# Patient Record
Sex: Male | Born: 1965 | Race: White | Hispanic: No | Marital: Single | Smoking: Former smoker
Health system: Southern US, Community
[De-identification: ages and names within clinical notes are randomized; demographics above are authoritative.]

## PROBLEM LIST (undated history)

## (undated) DIAGNOSIS — I2510 Atherosclerotic heart disease of native coronary artery without angina pectoris: Secondary | ICD-10-CM

## (undated) DIAGNOSIS — J449 Chronic obstructive pulmonary disease, unspecified: Secondary | ICD-10-CM

## (undated) DIAGNOSIS — J45909 Unspecified asthma, uncomplicated: Secondary | ICD-10-CM

## (undated) DIAGNOSIS — F419 Anxiety disorder, unspecified: Secondary | ICD-10-CM

## (undated) DIAGNOSIS — E119 Type 2 diabetes mellitus without complications: Secondary | ICD-10-CM

## (undated) DIAGNOSIS — I509 Heart failure, unspecified: Secondary | ICD-10-CM

## (undated) DIAGNOSIS — I10 Essential (primary) hypertension: Secondary | ICD-10-CM

## (undated) DIAGNOSIS — I4891 Unspecified atrial fibrillation: Secondary | ICD-10-CM

## (undated) HISTORY — PX: ORTHOPEDIC SURGERY: SHX850

## (undated) HISTORY — PX: SKIN GRAFT: SHX250

## (undated) HISTORY — PX: TOTAL HIP ARTHROPLASTY: SHX124

## (undated) HISTORY — PX: KNEE SURGERY: SHX244

---

## 2001-12-24 DIAGNOSIS — W3400XA Accidental discharge from unspecified firearms or gun, initial encounter: Secondary | ICD-10-CM | POA: Insufficient documentation

## 2001-12-24 DIAGNOSIS — Y249XXA Unspecified firearm discharge, undetermined intent, initial encounter: Secondary | ICD-10-CM | POA: Insufficient documentation

## 2005-09-19 ENCOUNTER — Other Ambulatory Visit: Payer: Self-pay

## 2005-09-19 ENCOUNTER — Emergency Department: Payer: Self-pay | Admitting: Emergency Medicine

## 2006-02-15 ENCOUNTER — Emergency Department: Payer: Self-pay | Admitting: Emergency Medicine

## 2007-05-01 ENCOUNTER — Other Ambulatory Visit: Payer: Self-pay

## 2007-05-01 ENCOUNTER — Inpatient Hospital Stay: Payer: Self-pay | Admitting: Internal Medicine

## 2007-05-03 ENCOUNTER — Inpatient Hospital Stay: Payer: Self-pay | Admitting: Unknown Physician Specialty

## 2007-07-16 ENCOUNTER — Inpatient Hospital Stay: Payer: Self-pay | Admitting: Unknown Physician Specialty

## 2007-08-12 ENCOUNTER — Encounter
Admission: RE | Admit: 2007-08-12 | Discharge: 2007-08-16 | Payer: Self-pay | Admitting: Physical Medicine & Rehabilitation

## 2007-08-16 ENCOUNTER — Ambulatory Visit: Payer: Self-pay | Admitting: Physical Medicine & Rehabilitation

## 2009-01-29 ENCOUNTER — Encounter: Payer: Self-pay | Admitting: Family Medicine

## 2009-02-23 ENCOUNTER — Encounter: Payer: Self-pay | Admitting: Family Medicine

## 2009-11-06 ENCOUNTER — Emergency Department: Payer: Self-pay | Admitting: Emergency Medicine
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
## 2011-04-27 DIAGNOSIS — M1610 Unilateral primary osteoarthritis, unspecified hip: Secondary | ICD-10-CM | POA: Insufficient documentation

## 2011-05-18 DIAGNOSIS — Z96649 Presence of unspecified artificial hip joint: Secondary | ICD-10-CM | POA: Insufficient documentation

## 2011-05-18 DIAGNOSIS — M21379 Foot drop, unspecified foot: Secondary | ICD-10-CM | POA: Insufficient documentation

## 2011-05-19 DIAGNOSIS — F329 Major depressive disorder, single episode, unspecified: Secondary | ICD-10-CM | POA: Insufficient documentation

## 2011-05-19 DIAGNOSIS — F1910 Other psychoactive substance abuse, uncomplicated: Secondary | ICD-10-CM | POA: Insufficient documentation

## 2011-10-22 DIAGNOSIS — M549 Dorsalgia, unspecified: Secondary | ICD-10-CM | POA: Insufficient documentation

## 2012-01-06 ENCOUNTER — Inpatient Hospital Stay: Payer: Self-pay | Admitting: Psychiatry

## 2012-01-06 LAB — URINALYSIS, COMPLETE
Bacteria: NONE SEEN
Bilirubin,UR: NEGATIVE
Blood: NEGATIVE
Glucose,UR: 50 mg/dL (ref 0–75)
Protein: NEGATIVE
RBC,UR: 2 /HPF (ref 0–5)
Squamous Epithelial: 2
WBC UR: 1 /HPF (ref 0–5)

## 2012-01-06 LAB — DRUG SCREEN, URINE
Amphetamines, Ur Screen: NEGATIVE (ref ?–1000)
Barbiturates, Ur Screen: NEGATIVE (ref ?–200)
Benzodiazepine, Ur Scrn: NEGATIVE (ref ?–200)
Cannabinoid 50 Ng, Ur ~~LOC~~: NEGATIVE (ref ?–50)
Cocaine Metabolite,Ur ~~LOC~~: NEGATIVE (ref ?–300)
MDMA (Ecstasy)Ur Screen: NEGATIVE (ref ?–500)
Phencyclidine (PCP) Ur S: NEGATIVE (ref ?–25)

## 2012-01-06 LAB — CBC
HCT: 44.9 % (ref 40.0–52.0)
HGB: 15.4 g/dL (ref 13.0–18.0)
MCV: 91 fL (ref 80–100)
RDW: 14.4 % (ref 11.5–14.5)
WBC: 5.8 10*3/uL (ref 3.8–10.6)

## 2012-01-06 LAB — COMPREHENSIVE METABOLIC PANEL
Albumin: 4.1 g/dL (ref 3.4–5.0)
Alkaline Phosphatase: 69 U/L (ref 50–136)
Bilirubin,Total: 0.4 mg/dL (ref 0.2–1.0)
Creatinine: 0.74 mg/dL (ref 0.60–1.30)
Glucose: 98 mg/dL (ref 65–99)
Osmolality: 267 (ref 275–301)
Potassium: 3.9 mmol/L (ref 3.5–5.1)
SGOT(AST): 18 U/L (ref 15–37)
Sodium: 135 mmol/L — ABNORMAL LOW (ref 136–145)
Total Protein: 8.8 g/dL — ABNORMAL HIGH (ref 6.4–8.2)

## 2012-01-06 LAB — ETHANOL
Ethanol %: <0.003 % (ref 0.000–0.080)
Ethanol: <3 mg/dL

## 2012-01-06 LAB — ACETAMINOPHEN LEVEL: Acetaminophen: 5 ug/mL — ABNORMAL LOW

## 2012-01-06 LAB — SALICYLATE LEVEL: Salicylates, Serum: 3.7 mg/dL — ABNORMAL HIGH

## 2012-01-07 LAB — BASIC METABOLIC PANEL
Anion Gap: 3 — ABNORMAL LOW (ref 7–16)
BUN: 7 mg/dL (ref 7–18)
Chloride: 104 mmol/L (ref 98–107)
Co2: 33 mmol/L — ABNORMAL HIGH (ref 21–32)
Creatinine: 0.67 mg/dL (ref 0.60–1.30)
EGFR (African American): >60
EGFR (Non-African Amer.): >60
Glucose: 131 mg/dL — ABNORMAL HIGH (ref 65–99)
Osmolality: 279 (ref 275–301)
Potassium: 4.3 mmol/L (ref 3.5–5.1)
Sodium: 140 mmol/L (ref 136–145)

## 2012-01-12 ENCOUNTER — Ambulatory Visit: Payer: Self-pay | Admitting: Pain Medicine

## 2012-01-28 ENCOUNTER — Ambulatory Visit: Payer: Self-pay | Admitting: Pain Medicine

## 2012-02-23 ENCOUNTER — Inpatient Hospital Stay: Payer: Self-pay | Admitting: Internal Medicine

## 2012-02-23 LAB — PRO B NATRIURETIC PEPTIDE: B-Type Natriuretic Peptide: 442 pg/mL — ABNORMAL HIGH (ref 0–125)

## 2012-02-23 LAB — COMPREHENSIVE METABOLIC PANEL
Alkaline Phosphatase: 110 U/L (ref 50–136)
BUN: 5 mg/dL — ABNORMAL LOW (ref 7–18)
Bilirubin,Total: 0.5 mg/dL (ref 0.2–1.0)
Calcium, Total: 9.3 mg/dL (ref 8.5–10.1)
Chloride: 97 mmol/L — ABNORMAL LOW (ref 98–107)
Co2: 36 mmol/L — ABNORMAL HIGH (ref 21–32)
Creatinine: 0.55 mg/dL — ABNORMAL LOW (ref 0.60–1.30)
EGFR (African American): >60
EGFR (Non-African Amer.): >60
Osmolality: 276 (ref 275–301)
Potassium: 4.3 mmol/L (ref 3.5–5.1)
SGPT (ALT): 13 U/L (ref 12–78)
Sodium: 137 mmol/L (ref 136–145)
Total Protein: 8.7 g/dL — ABNORMAL HIGH (ref 6.4–8.2)

## 2012-02-23 LAB — CK TOTAL AND CKMB (NOT AT ARMC): CK-MB: <0.5 ng/mL — ABNORMAL LOW (ref 0.5–3.6)

## 2012-02-23 LAB — CBC
HGB: 12.5 g/dL — ABNORMAL LOW (ref 13.0–18.0)
MCH: 28.8 pg (ref 26.0–34.0)
Platelet: 329 10*3/uL (ref 150–440)
RBC: 4.34 10*6/uL — ABNORMAL LOW (ref 4.40–5.90)
RDW: 14.9 % — ABNORMAL HIGH (ref 11.5–14.5)
WBC: 10.3 10*3/uL (ref 3.8–10.6)

## 2012-02-23 LAB — TROPONIN I: Troponin-I: <0.02 ng/mL

## 2012-02-24 LAB — CBC WITH DIFFERENTIAL/PLATELET
Basophil %: 0.2 %
Eosinophil #: 0 10*3/uL (ref 0.0–0.7)
HCT: 38.1 % — ABNORMAL LOW (ref 40.0–52.0)
HGB: 12.4 g/dL — ABNORMAL LOW (ref 13.0–18.0)
Lymphocyte %: 7.4 %
Monocyte #: 0.3 x10 3/mm (ref 0.2–1.0)
Neutrophil #: 5.7 10*3/uL (ref 1.4–6.5)
Platelet: 276 10*3/uL (ref 150–440)
RBC: 4.1 10*6/uL — ABNORMAL LOW (ref 4.40–5.90)

## 2012-02-24 LAB — LIPID PANEL
HDL Cholesterol: 47 mg/dL (ref 40–60)
Triglycerides: 68 mg/dL (ref 0–200)
VLDL Cholesterol, Calc: 14 mg/dL (ref 5–40)

## 2012-02-24 LAB — BASIC METABOLIC PANEL
Anion Gap: 3 — ABNORMAL LOW (ref 7–16)
BUN: 13 mg/dL (ref 7–18)
Calcium, Total: 9.1 mg/dL (ref 8.5–10.1)
Chloride: 96 mmol/L — ABNORMAL LOW (ref 98–107)
Creatinine: 0.69 mg/dL (ref 0.60–1.30)
Glucose: 182 mg/dL — ABNORMAL HIGH (ref 65–99)
Osmolality: 280 (ref 275–301)

## 2012-02-29 LAB — CULTURE, BLOOD (SINGLE)

## 2012-03-02 ENCOUNTER — Emergency Department: Payer: Self-pay | Admitting: Emergency Medicine

## 2012-03-02 LAB — COMPREHENSIVE METABOLIC PANEL
Anion Gap: 2 — ABNORMAL LOW (ref 7–16)
Bilirubin,Total: 0.3 mg/dL (ref 0.2–1.0)
EGFR (African American): >60
EGFR (Non-African Amer.): >60
Glucose: 127 mg/dL — ABNORMAL HIGH (ref 65–99)
SGPT (ALT): 34 U/L (ref 12–78)
Sodium: 135 mmol/L — ABNORMAL LOW (ref 136–145)

## 2012-03-02 LAB — CBC
HCT: 42.5 % (ref 40.0–52.0)
MCH: 30 pg (ref 26.0–34.0)
MCV: 92 fL (ref 80–100)
Platelet: 218 10*3/uL (ref 150–440)
RBC: 4.59 10*6/uL (ref 4.40–5.90)
RDW: 15.3 % — ABNORMAL HIGH (ref 11.5–14.5)
WBC: 6.9 10*3/uL (ref 3.8–10.6)

## 2012-04-07 ENCOUNTER — Inpatient Hospital Stay: Payer: Self-pay | Admitting: Internal Medicine

## 2012-04-07 LAB — COMPREHENSIVE METABOLIC PANEL
BUN: 5 mg/dL — ABNORMAL LOW (ref 7–18)
Bilirubin,Total: 0.3 mg/dL (ref 0.2–1.0)
Calcium, Total: 8.6 mg/dL (ref 8.5–10.1)
Co2: 36 mmol/L — ABNORMAL HIGH (ref 21–32)
EGFR (African American): >60
SGPT (ALT): 14 U/L (ref 12–78)
Sodium: 138 mmol/L (ref 136–145)

## 2012-04-07 LAB — CBC WITH DIFFERENTIAL/PLATELET
Basophil #: 0 10*3/uL (ref 0.0–0.1)
Eosinophil #: 0.1 10*3/uL (ref 0.0–0.7)
HCT: 41.8 % (ref 40.0–52.0)
HGB: 13.5 g/dL (ref 13.0–18.0)
Lymphocyte #: 1.4 10*3/uL (ref 1.0–3.6)
MCH: 29.9 pg (ref 26.0–34.0)
MCV: 93 fL (ref 80–100)
RBC: 4.5 10*6/uL (ref 4.40–5.90)

## 2012-04-07 LAB — PRO B NATRIURETIC PEPTIDE: B-Type Natriuretic Peptide: 926 pg/mL — ABNORMAL HIGH (ref 0–125)

## 2012-04-07 LAB — TROPONIN I: Troponin-I: <0.02 ng/mL

## 2012-04-07 LAB — RAPID INFLUENZA A&B ANTIGENS

## 2012-04-08 LAB — BASIC METABOLIC PANEL
Anion Gap: 3 — ABNORMAL LOW (ref 7–16)
BUN: 7 mg/dL (ref 7–18)
Co2: 35 mmol/L — ABNORMAL HIGH (ref 21–32)
EGFR (African American): >60
Glucose: 117 mg/dL — ABNORMAL HIGH (ref 65–99)
Osmolality: 278 (ref 275–301)
Potassium: 4.3 mmol/L (ref 3.5–5.1)
Sodium: 140 mmol/L (ref 136–145)

## 2012-04-08 LAB — CBC WITH DIFFERENTIAL/PLATELET
Basophil #: 0 10*3/uL (ref 0.0–0.1)
Basophil %: 0.1 %
Eosinophil #: 0 10*3/uL (ref 0.0–0.7)
HCT: 41.5 % (ref 40.0–52.0)
HGB: 13.2 g/dL (ref 13.0–18.0)
MCHC: 31.8 g/dL — ABNORMAL LOW (ref 32.0–36.0)
Monocyte #: 0.3 x10 3/mm (ref 0.2–1.0)
Neutrophil %: 76 %
RBC: 4.49 10*6/uL (ref 4.40–5.90)
RDW: 14.7 % — ABNORMAL HIGH (ref 11.5–14.5)

## 2012-05-26 ENCOUNTER — Inpatient Hospital Stay: Payer: Self-pay | Admitting: Internal Medicine

## 2012-05-26 LAB — TROPONIN I: Troponin-I: <0.02 ng/mL

## 2012-05-26 LAB — COMPREHENSIVE METABOLIC PANEL
Albumin: 3.7 g/dL (ref 3.4–5.0)
Alkaline Phosphatase: 55 U/L (ref 50–136)
Anion Gap: 2 — ABNORMAL LOW (ref 7–16)
BUN: 6 mg/dL — ABNORMAL LOW (ref 7–18)
Bilirubin,Total: 0.4 mg/dL (ref 0.2–1.0)
Calcium, Total: 8.5 mg/dL (ref 8.5–10.1)
Co2: 36 mmol/L — ABNORMAL HIGH (ref 21–32)
Creatinine: 0.6 mg/dL (ref 0.60–1.30)
EGFR (African American): >60
EGFR (Non-African Amer.): >60
Glucose: 156 mg/dL — ABNORMAL HIGH (ref 65–99)
Osmolality: 275 (ref 275–301)
Sodium: 137 mmol/L (ref 136–145)

## 2012-05-26 LAB — PRO B NATRIURETIC PEPTIDE: B-Type Natriuretic Peptide: 146 pg/mL — ABNORMAL HIGH (ref 0–125)

## 2012-05-26 LAB — CBC
HCT: 41 % (ref 40.0–52.0)
MCH: 29.8 pg (ref 26.0–34.0)
MCHC: 33.2 g/dL (ref 32.0–36.0)
RBC: 4.57 10*6/uL (ref 4.40–5.90)
RDW: 15.3 % — ABNORMAL HIGH (ref 11.5–14.5)
WBC: 3.9 10*3/uL (ref 3.8–10.6)

## 2012-05-26 LAB — CK TOTAL AND CKMB (NOT AT ARMC): CK, Total: 52 U/L (ref 35–232)

## 2012-05-27 LAB — CBC WITH DIFFERENTIAL/PLATELET
Basophil #: 0 10*3/uL (ref 0.0–0.1)
Eosinophil #: 0 10*3/uL (ref 0.0–0.7)
HCT: 43.5 % (ref 40.0–52.0)
HGB: 14.2 g/dL (ref 13.0–18.0)
Lymphocyte #: 0.4 10*3/uL — ABNORMAL LOW (ref 1.0–3.6)
Lymphocyte %: 5.9 %
MCH: 29.5 pg (ref 26.0–34.0)
Monocyte #: 0.2 x10 3/mm (ref 0.2–1.0)
Monocyte %: 2.4 %
Neutrophil %: 91.6 %
Platelet: 169 10*3/uL (ref 150–440)
RBC: 4.82 10*6/uL (ref 4.40–5.90)
RDW: 15.4 % — ABNORMAL HIGH (ref 11.5–14.5)
WBC: 7.1 10*3/uL (ref 3.8–10.6)

## 2012-05-27 LAB — BASIC METABOLIC PANEL
BUN: 12 mg/dL (ref 7–18)
Calcium, Total: 8.8 mg/dL (ref 8.5–10.1)
Chloride: 101 mmol/L (ref 98–107)
Co2: 30 mmol/L (ref 21–32)
EGFR (Non-African Amer.): >60
Osmolality: 275 (ref 275–301)

## 2012-06-17 ENCOUNTER — Inpatient Hospital Stay: Payer: Self-pay | Admitting: Family Medicine

## 2012-06-17 LAB — BASIC METABOLIC PANEL
BUN: 6 mg/dL — ABNORMAL LOW (ref 7–18)
Calcium, Total: 8.8 mg/dL (ref 8.5–10.1)
Co2: 36 mmol/L — ABNORMAL HIGH (ref 21–32)
Creatinine: 0.58 mg/dL — ABNORMAL LOW (ref 0.60–1.30)
EGFR (African American): >60
EGFR (Non-African Amer.): >60
Potassium: 3.9 mmol/L (ref 3.5–5.1)

## 2012-06-17 LAB — CBC
HCT: 40.6 % (ref 40.0–52.0)
HGB: 13.5 g/dL (ref 13.0–18.0)
MCHC: 33.2 g/dL (ref 32.0–36.0)
Platelet: 159 10*3/uL (ref 150–440)
RDW: 16.1 % — ABNORMAL HIGH (ref 11.5–14.5)

## 2012-06-17 LAB — CK TOTAL AND CKMB (NOT AT ARMC): CK, Total: 47 U/L (ref 35–232)

## 2012-06-17 LAB — HEMOGLOBIN A1C: Hemoglobin A1C: 6.9 % — ABNORMAL HIGH (ref 4.2–6.3)

## 2012-06-17 LAB — TROPONIN I: Troponin-I: <0.02 ng/mL

## 2012-06-18 LAB — CBC WITH DIFFERENTIAL/PLATELET
Basophil #: 0 10*3/uL (ref 0.0–0.1)
Eosinophil %: 0.1 %
HGB: 13.5 g/dL (ref 13.0–18.0)
Lymphocyte #: 0.2 10*3/uL — ABNORMAL LOW (ref 1.0–3.6)
Lymphocyte %: 6.9 %
MCHC: 32.8 g/dL (ref 32.0–36.0)
Monocyte #: 0 x10 3/mm — ABNORMAL LOW (ref 0.2–1.0)
Monocyte %: 1.2 %
Neutrophil #: 3.3 10*3/uL (ref 1.4–6.5)
Platelet: 151 10*3/uL (ref 150–440)
RDW: 16.3 % — ABNORMAL HIGH (ref 11.5–14.5)

## 2012-06-18 LAB — BASIC METABOLIC PANEL
Chloride: 98 mmol/L (ref 98–107)
Creatinine: 0.61 mg/dL (ref 0.60–1.30)
EGFR (African American): >60
Glucose: 176 mg/dL — ABNORMAL HIGH (ref 65–99)
Potassium: 4.5 mmol/L (ref 3.5–5.1)
Sodium: 136 mmol/L (ref 136–145)

## 2012-06-19 LAB — CBC WITH DIFFERENTIAL/PLATELET
Basophil #: 0 10*3/uL (ref 0.0–0.1)
Basophil %: 0.1 %
Eosinophil #: 0 10*3/uL (ref 0.0–0.7)
Eosinophil %: 0.1 %
HCT: 43.7 % (ref 40.0–52.0)
HGB: 14.5 g/dL (ref 13.0–18.0)
Lymphocyte %: 5.5 %
Monocyte #: 0.2 x10 3/mm (ref 0.2–1.0)
Neutrophil #: 5.3 10*3/uL (ref 1.4–6.5)
Neutrophil %: 90.6 %
Platelet: 158 10*3/uL (ref 150–440)
WBC: 5.9 10*3/uL (ref 3.8–10.6)

## 2012-06-19 LAB — BASIC METABOLIC PANEL
Anion Gap: 3 — ABNORMAL LOW (ref 7–16)
BUN: 11 mg/dL (ref 7–18)
Co2: 34 mmol/L — ABNORMAL HIGH (ref 21–32)
Creatinine: 0.43 mg/dL — ABNORMAL LOW (ref 0.60–1.30)
EGFR (Non-African Amer.): >60
Glucose: 153 mg/dL — ABNORMAL HIGH (ref 65–99)
Osmolality: 282 (ref 275–301)
Sodium: 140 mmol/L (ref 136–145)

## 2012-06-19 LAB — PRO B NATRIURETIC PEPTIDE: B-Type Natriuretic Peptide: 543 pg/mL — ABNORMAL HIGH (ref 0–125)

## 2012-06-19 LAB — CK TOTAL AND CKMB (NOT AT ARMC): CK-MB: <0.5 ng/mL — ABNORMAL LOW (ref 0.5–3.6)

## 2012-06-20 DIAGNOSIS — I4891 Unspecified atrial fibrillation: Secondary | ICD-10-CM

## 2012-06-20 LAB — BASIC METABOLIC PANEL
BUN: 16 mg/dL (ref 7–18)
Calcium, Total: 8.5 mg/dL (ref 8.5–10.1)
Creatinine: 0.56 mg/dL — ABNORMAL LOW (ref 0.60–1.30)
EGFR (African American): >60
Glucose: 197 mg/dL — ABNORMAL HIGH (ref 65–99)
Osmolality: 288 (ref 275–301)
Potassium: 4.3 mmol/L (ref 3.5–5.1)

## 2012-06-21 LAB — BASIC METABOLIC PANEL
Anion Gap: 6 — ABNORMAL LOW (ref 7–16)
BUN: 16 mg/dL (ref 7–18)
Chloride: 102 mmol/L (ref 98–107)
EGFR (African American): >60
Glucose: 173 mg/dL — ABNORMAL HIGH (ref 65–99)
Osmolality: 283 (ref 275–301)

## 2012-07-17 ENCOUNTER — Inpatient Hospital Stay: Payer: Self-pay | Admitting: Student

## 2012-07-17 LAB — BASIC METABOLIC PANEL
Anion Gap: 5 — ABNORMAL LOW (ref 7–16)
BUN: 9 mg/dL (ref 7–18)
Calcium, Total: 8.7 mg/dL (ref 8.5–10.1)
Chloride: 98 mmol/L (ref 98–107)
Creatinine: 0.77 mg/dL (ref 0.60–1.30)
EGFR (African American): >60
EGFR (Non-African Amer.): >60
Glucose: 175 mg/dL — ABNORMAL HIGH (ref 65–99)
Osmolality: 273 (ref 275–301)
Potassium: 3.2 mmol/L — ABNORMAL LOW (ref 3.5–5.1)

## 2012-07-17 LAB — HEMOGLOBIN A1C: Hemoglobin A1C: 6.7 % — ABNORMAL HIGH (ref 4.2–6.3)

## 2012-07-17 LAB — CBC
MCHC: 34.3 g/dL (ref 32.0–36.0)
MCV: 90 fL (ref 80–100)
Platelet: 232 10*3/uL (ref 150–440)
RDW: 15.4 % — ABNORMAL HIGH (ref 11.5–14.5)

## 2012-07-18 LAB — CBC WITH DIFFERENTIAL/PLATELET
Basophil %: 0.1 %
Eosinophil #: 0 10*3/uL (ref 0.0–0.7)
Eosinophil %: 0.1 %
HCT: 42.4 % (ref 40.0–52.0)
HGB: 14.6 g/dL (ref 13.0–18.0)
MCHC: 34.4 g/dL (ref 32.0–36.0)
MCV: 89 fL (ref 80–100)
Neutrophil #: 5.6 10*3/uL (ref 1.4–6.5)
Neutrophil %: 91.3 %
Platelet: 238 10*3/uL (ref 150–440)
RBC: 4.75 10*6/uL (ref 4.40–5.90)
RDW: 15.3 % — ABNORMAL HIGH (ref 11.5–14.5)
WBC: 6.1 10*3/uL (ref 3.8–10.6)

## 2012-07-18 LAB — BASIC METABOLIC PANEL
Anion Gap: 7 (ref 7–16)
BUN: 8 mg/dL (ref 7–18)
Chloride: 101 mmol/L (ref 98–107)
EGFR (Non-African Amer.): >60
Glucose: 181 mg/dL — ABNORMAL HIGH (ref 65–99)
Potassium: 4.2 mmol/L (ref 3.5–5.1)
Sodium: 138 mmol/L (ref 136–145)

## 2012-07-29 ENCOUNTER — Inpatient Hospital Stay: Payer: Self-pay | Admitting: Internal Medicine

## 2012-07-29 LAB — BASIC METABOLIC PANEL
BUN: 5 mg/dL — ABNORMAL LOW (ref 7–18)
Creatinine: 0.77 mg/dL (ref 0.60–1.30)
EGFR (African American): >60
EGFR (Non-African Amer.): >60
Osmolality: 280 (ref 275–301)
Potassium: 3.9 mmol/L (ref 3.5–5.1)
Sodium: 139 mmol/L (ref 136–145)

## 2012-07-29 LAB — HEMOGLOBIN A1C: Hemoglobin A1C: 6.8 % — ABNORMAL HIGH (ref 4.2–6.3)

## 2012-07-29 LAB — TROPONIN I: Troponin-I: <0.02 ng/mL

## 2012-07-29 LAB — CBC
HGB: 14 g/dL (ref 13.0–18.0)
MCH: 31.4 pg (ref 26.0–34.0)
MCV: 92 fL (ref 80–100)
RDW: 15.4 % — ABNORMAL HIGH (ref 11.5–14.5)

## 2012-07-30 LAB — COMPREHENSIVE METABOLIC PANEL
Albumin: 2.9 g/dL — ABNORMAL LOW (ref 3.4–5.0)
Alkaline Phosphatase: 42 U/L — ABNORMAL LOW (ref 50–136)
Anion Gap: 2 — ABNORMAL LOW (ref 7–16)
BUN: 7 mg/dL (ref 7–18)
Calcium, Total: 8.7 mg/dL (ref 8.5–10.1)
Chloride: 101 mmol/L (ref 98–107)
Co2: 35 mmol/L — ABNORMAL HIGH (ref 21–32)
Creatinine: 0.56 mg/dL — ABNORMAL LOW (ref 0.60–1.30)
EGFR (African American): >60
EGFR (Non-African Amer.): >60
Osmolality: 279 (ref 275–301)
Potassium: 4.1 mmol/L (ref 3.5–5.1)
SGPT (ALT): 23 U/L (ref 12–78)
Total Protein: 6.1 g/dL — ABNORMAL LOW (ref 6.4–8.2)

## 2012-07-30 LAB — CBC WITH DIFFERENTIAL/PLATELET
HCT: 38.3 % — ABNORMAL LOW (ref 40.0–52.0)
HGB: 13.1 g/dL (ref 13.0–18.0)
MCH: 31.5 pg (ref 26.0–34.0)
MCV: 92 fL (ref 80–100)
Monocyte #: 0.1 x10 3/mm — ABNORMAL LOW (ref 0.2–1.0)
Neutrophil %: 93.4 %
Platelet: 161 10*3/uL (ref 150–440)
RBC: 4.16 10*6/uL — ABNORMAL LOW (ref 4.40–5.90)
RDW: 14.8 % — ABNORMAL HIGH (ref 11.5–14.5)
WBC: 7.4 10*3/uL (ref 3.8–10.6)

## 2012-07-30 LAB — TROPONIN I: Troponin-I: <0.02 ng/mL

## 2012-12-26 ENCOUNTER — Emergency Department: Payer: Self-pay | Admitting: Emergency Medicine

## 2012-12-26 LAB — CBC
HGB: 15.8 g/dL (ref 13.0–18.0)
MCV: 93 fL (ref 80–100)
Platelet: 205 10*3/uL (ref 150–440)
RBC: 5.02 10*6/uL (ref 4.40–5.90)
RDW: 13.4 % (ref 11.5–14.5)

## 2012-12-26 LAB — COMPREHENSIVE METABOLIC PANEL
Alkaline Phosphatase: 69 U/L (ref 50–136)
BUN: 7 mg/dL (ref 7–18)
Bilirubin,Total: 0.4 mg/dL (ref 0.2–1.0)
Calcium, Total: 9.2 mg/dL (ref 8.5–10.1)
Co2: 35 mmol/L — ABNORMAL HIGH (ref 21–32)
Creatinine: 0.74 mg/dL (ref 0.60–1.30)
EGFR (African American): >60
EGFR (Non-African Amer.): >60
Osmolality: 263 (ref 275–301)
Potassium: 4.4 mmol/L (ref 3.5–5.1)
SGOT(AST): 31 U/L (ref 15–37)
SGPT (ALT): 38 U/L (ref 12–78)
Total Protein: 7.9 g/dL (ref 6.4–8.2)

## 2012-12-26 LAB — URINALYSIS, COMPLETE
Glucose,UR: NEGATIVE mg/dL (ref 0–75)
Ketone: NEGATIVE
Nitrite: NEGATIVE
Protein: NEGATIVE
RBC,UR: 2 /HPF (ref 0–5)
Specific Gravity: 1.011 (ref 1.003–1.030)
Squamous Epithelial: 2
WBC UR: <1 /HPF (ref 0–5)

## 2012-12-26 LAB — ETHANOL
Ethanol %: <0.003 % (ref 0.000–0.080)
Ethanol: <3 mg/dL

## 2012-12-26 LAB — DRUG SCREEN, URINE
Amphetamines, Ur Screen: POSITIVE (ref ?–1000)
Barbiturates, Ur Screen: NEGATIVE (ref ?–200)
Benzodiazepine, Ur Scrn: POSITIVE (ref ?–200)
Cocaine Metabolite,Ur ~~LOC~~: NEGATIVE (ref ?–300)
Methadone, Ur Screen: NEGATIVE (ref ?–300)
Tricyclic, Ur Screen: NEGATIVE (ref ?–1000)

## 2013-01-05 DIAGNOSIS — J189 Pneumonia, unspecified organism: Secondary | ICD-10-CM | POA: Insufficient documentation

## 2013-05-31 DIAGNOSIS — G8929 Other chronic pain: Secondary | ICD-10-CM | POA: Insufficient documentation

## 2013-06-01 DIAGNOSIS — Z87828 Personal history of other (healed) physical injury and trauma: Secondary | ICD-10-CM | POA: Insufficient documentation

## 2013-06-01 DIAGNOSIS — F419 Anxiety disorder, unspecified: Secondary | ICD-10-CM | POA: Insufficient documentation

## 2013-08-24 DIAGNOSIS — Z720 Tobacco use: Secondary | ICD-10-CM | POA: Insufficient documentation

## 2013-08-24 DIAGNOSIS — Z716 Tobacco abuse counseling: Secondary | ICD-10-CM | POA: Insufficient documentation

## 2014-02-20 DIAGNOSIS — F323 Major depressive disorder, single episode, severe with psychotic features: Secondary | ICD-10-CM | POA: Insufficient documentation

## 2014-03-04 DIAGNOSIS — J9621 Acute and chronic respiratory failure with hypoxia: Secondary | ICD-10-CM | POA: Insufficient documentation

## 2014-03-04 DIAGNOSIS — J9622 Acute and chronic respiratory failure with hypercapnia: Secondary | ICD-10-CM

## 2014-05-04 DIAGNOSIS — G4733 Obstructive sleep apnea (adult) (pediatric): Secondary | ICD-10-CM | POA: Insufficient documentation

## 2014-06-11 DIAGNOSIS — R45851 Suicidal ideations: Secondary | ICD-10-CM | POA: Insufficient documentation

## 2014-06-12 NOTE — Discharge Summary (Signed)
PATIENT NAME:  Craig Wright, Craig Wright MR#:  [id number] DATE OF BIRTH:  1965/04/08  DATE OF ADMISSION:  01/06/2012 DATE OF DISCHARGE:  01/11/2012  HOSPITAL COURSE: See dictated history and physical for details of admission. This 49 year old man came into the hospital requesting detox from opiates because of his overuse of his prescription pain medicine. The patient does have a severe chronic pain problem. Dr. Nicolasa Ducking worked with him for the first half of his hospital stay and they agreed that he would not be taken completely off of opiates but would be taken down to a lower stable dose of opiates. The patient has also been treated for his depression and anxiety with Cymbalta and Neurontin. Low dose of Risperdal was added because of perceived paranoia. The patient has not displayed any dangerous or suicidal behavior in the hospital. He has been cooperative with treatment. He is looking forward to going to the Pain Clinic to have an injection in his spine tomorrow which he is optimistic about. The patient at this point is not showing any signs of withdrawal. Medically stable. Mentally much improved without crying spells, agitation, anxiety, or panic attacks. Denies suicidality. The patient has a safe place to live and has follow-up treatment in place. He will be discharged to follow-up in the community.   DISCHARGE MEDICATIONS:  1. Trazodone 25 mg at night.  2. Risperdal 1 mg at night.  3. Gabapentin 300 mg 3 times a day.  4. Spiriva HandiHaler 1 capsule daily.  5. Remeron 45 mg at night.  6. Cymbalta 30 mg at bedtime and 60 mg in the morning.  7. Norvasc 5 mg per day.  8. Alprazolam twice a day. 9. Combivent inhaler 1 puff 4 times a day. 10. Percocet 1 tablet q.6 hours.   LABORATORY RESULTS: On admission acetaminophen low below toxic at 5, BUN 4, slightly low. Sodium slightly low at 135, total protein elevated at 8.8. Alcohol undetectable. CBC normal. Salicylates slightly elevated but not toxic. Urinalysis  unremarkable. Drug screen positive for opiates.   EKG showed normal sinus rhythm, possible left atrial enlargement.   Vitamin B12 level was normal. Follow-up chemistry showed a glucose slightly elevated at 131, sodium normal at 140, CO2 elevated at 33.  X-ray done in his eyes showed no signs of metal.   MRI done on the spine showed large left paracentral L4-5 disk protrusion with severe spinal stenosis.  A KUB showed no contraindication to MRI.   DISCHARGE MENTAL STATUS EXAMINATION: Casually dressed, adequately groomed man, looks his stated age. Clearly he has a limp and has malformations from his previous trauma and appears to be in chronic pain. Eye contact good. Psychomotor activity slow. Speech normal in rate, tone, and volume. Affect euthymic, reactive and appropriate. Mood stated as okay. Thoughts are lucid without any loosening of associations or delusions. Denies auditory or visual hallucinations or any sort of paranoia. Denies suicidal or homicidal ideation. Good insight and judgment improved, general outlook improved, optimism improved, insight and judgment normal memory.   DIAGNOSIS PRINCIPLE AND PRIMARY:  AXIS I: Depression, not otherwise specified.   SECONDARY DIAGNOSES:  AXIS I: History of opiate abuse.   AXIS II: Deferred.   AXIS III:  1. Chronic pain.  2. Chronic obstructive pulmonary disease.   AXIS IV: Severe from his chronic pain and disability.        AXIS V: Functioning at time of discharge 88.  ____________________________ Gonzella Lex, MD jtc:drc D: 01/11/2012 10:59:09 ET T: 01/11/2012 12:18:32 ET JOB#:  [id number]  cc: Gonzella Lex, MD, <Dictator> Gonzella Lex MD ELECTRONICALLY SIGNED 01/11/2012 13:00
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
## 2014-06-15 NOTE — Discharge Summary (Signed)
PATIENT NAME:  Craig Wright, Craig Wright MR#:  [id number] DATE OF BIRTH:  02/20/1966  DATE OF ADMISSION:  05/26/2012 DATE OF DISCHARGE:  05/28/2012  PRIMARY CARE PHYSICIAN:  Dr. Brunetta Genera.   DISCHARGE DIAGNOSES: 1.  Acute respiratory failure over chronic respiratory failure.  2.  Acute chronic obstructive pulmonary disease exacerbation.  3.  Acute bronchitis.  4.  Tobacco abuse.   IMAGING STUDIES DONE:  Include a chest x-ray showed no acute abnormalities.   ADMITTING HISTORY, PHYSICAL AND HOSPITAL COURSE:  Please see detailed H and P dictated on 05/26/2012.  In brief, a 48 year old patient was admitted for worsening shortness of breath, wheezing, without any pneumonia on chest x-ray for acute chronic obstructive pulmonary disease exacerbation.  The patient was started on IV steroids, nebulizers, along with oxygen support.  He did need increased oxygen initially, but on the day of discharge is back to baseline without any wheezing on exam.  He is being transitioned from IV steroids to oral steroids, the antibiotics will be continued for 4 more days and steroids tapered over 12 days.  The patient feels comfortable, has walked around the hallway without any shortness of breath.   No wheezing on exam.  Blood pressure is 127/72, saturating 94% on 3 liters of oxygen.   HOME MEDICATIONS: 1.  Cymbalta 60 mg oral 2 times a day.  2.  Metformin 500 mg oral twice a day. 3.  Spiriva inhaled once a day.  4.  Remeron 45 mg oral once a day.  5.  Percocet 10/325 1 tablet oral every 6 hours as needed for pain.  6.  Norvasc 5 mg oral once a day.  7.  Risperdal 1 mg oral once a day.  8.  Trazodone 50 mg 1/2 tablet oral once a day at bedtime.  9.  DuoNeb 3 ml nebulized 4 times a day.  10.  Advair Diskus 250/50 inhaled 2 times a day.  11.  Xanax 1 mg oral 3 times a day as needed.  12.  Aspirin 81 mg oral once a day.  13.  Prednisone 60 mg oral on day 1 and tapered over 12 days.  14.  Levaquin 750 mg oral once a day  for 4 days.   DISCHARGE INSTRUCTIONS:  The patient will be on a low-sodium diet with activity as tolerated.  Continue oxygen at 3 liters at home.  Follow up with primary care physician in one week.  I discussed this plan with the patient and his mother at bedside.   The patient was also counseled to quit smoking during the hospital stay.   Time spent on day of discharge was 35 minutes.    ____________________________ Leia Alf Deaisa Merida, MD srs:ea D: 05/28/2012 15:08:23 ET T: 05/29/2012 06:28:42 ET JOB#: [id number]  cc: Alveta Heimlich R. Annagrace Carr, MD, <Dictator> Dr. Dayton Martes MD ELECTRONICALLY SIGNED 05/30/2012 14:58
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
## 2014-06-15 NOTE — Discharge Summary (Signed)
PATIENT NAME:  Craig Wright, Craig Wright MR#:  [id number] DATE OF BIRTH:  August 27, 1965  DATE OF ADMISSION:  02/23/2012 DATE OF DISCHARGE:  02/26/2012  PRESENTING COMPLAINT:  Shortness of breath and cough.   DISCHARGE DIAGNOSES:   1.  Acute on chronic hypoxic respiratory failure secondary to chronic obstructive pulmonary disease failure.  2.  Pneumonia, right lower lobe.  3.  Tobacco abuse.  4.  New onset diabetes.   CODE STATUS:  Full code.   Saturations _____ than 100% on 2 liters nasal cannula oxygen. The patient uses chronically home oxygen.   MEDICATIONS:   1.  Norvasc 5 mg daily.  2.  Remeron 45 mg at bedtime.  3.  Percocet 10/325 mg 1 tablet q. 6 hours p.r.n.  4.  Combivent 1 puff 4 times a day as needed.  5.  Neurontin 300 mg 1 capsule 3 times a day.  6.  Trazodone 25 mg at bedtime.  7.  Risperdal 1 mg at bedtime.  8.  Spiriva 1 capsule inhalation daily.  9.  Cymbalta 60 mg b.i.d.  10.  Xanax 1 mg 3 times a day.  11.  Metformin 500 mg twice a day.  12.  Levofloxacin 750 mg orally daily.  13.  Prednisone taper as instructed.   DIET:  ADA 1800 calorie diet, regular consistency.   FOLLOWUP:   1.  With Dr. Brunetta Genera in 1 to 2 weeks.  2.  The patient prescribed glucometer. Recommended to keep log of sugars to review with primary care physician.    DIAGNOSTIC DATA:  White count 6.4, H and H 12.4 and 38.1, platelet count 276. Glucose 182, BUN is 13, creatinine 0.69, sodium 138, potassium 4.7, chloride 96, bicarbonate 39, calcium 9.1. Lipid profile is within normal limits. Cardiac enzymes x3 are negative. Hemoglobin A1c is 6.7. Echo Doppler showed normal EF of more than 55%, no wall motion abnormality. Left atrial and right atrial size is normal. Blood cultures negative in 48 hours. BNP is 42. LFTs are within normal limits. Chest x-ray on admission right lower lobe pneumonia.   BRIEF SUMMARY OF HOSPITAL COURSE:  The patient is a 49 year old Caucasian gentleman with history of COPD on home  oxygen and hypertension who comes in with:  1.  Acute on chronic respiratory failure secondary to COPD flare and right lower lobe pneumonia. The patient was started on IV Solu-Medrol and was changed to p.o. taper once symptoms improved. He was continued on IV antibiotics and changed to p.o. to complete a 7-day course. His oxygen was weaned down to 2 liters nasal cannula oxygen.  2.  SIRS secondary to right lower lobe pneumonia. The patient will complete a course of Levaquin.  3.  COPD exacerbation. Steroids, nebulizers and inhalers were given. The patient will continue his home oxygen as before.  4.  Tobacco abuse. The patient was advised smoking cessation.  Anxiety or depression on Remeron and p.r.n. pain medications.  5.  Anxiety and depression on Remeron and p.r.n. pain meds.  6.  Hospital stay otherwise remained stable. The patient will follow up with Dr. Brunetta Genera as outpatient.   TIME SPENT:  40 minutes.   ____________________________ Hart Rochester Posey Pronto, MD sap:si D: 02/28/2012 07:08:14 ET T: 02/28/2012 15:39:31 ET JOB#: [id number]  cc: Ivoree Felmlee A. Posey Pronto, MD, <Dictator> Meindert A. Brunetta Genera, MD Ilda Basset MD ELECTRONICALLY SIGNED 03/04/2012 12:34

## 2014-06-15 NOTE — Consult Note (Signed)
Brief Consult Note: Diagnosis: Atrial fibrillation: first documented episode in setting of COPD.   Patient was seen by consultant.   Consult note dictated.   Discussed with Attending MD.   Comments: I think we should try Diltiazem or Metoprolol before considering an antiarrhythmic medication.  I stopped Amidoarone and started Diltiazem 60 mg po q 6 hours which can be changed to long acting upon discharge. I also stopped Amlodipine.  Electronic Signatures: Kathlyn Sacramento (MD)  (Signed 28-Apr-14 18:11)  Authored: Brief Consult Note   Last Updated: 28-Apr-14 18:11 by Kathlyn Sacramento (MD)
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
## 2014-11-08 DIAGNOSIS — E1165 Type 2 diabetes mellitus with hyperglycemia: Secondary | ICD-10-CM | POA: Insufficient documentation

## 2014-11-15 DIAGNOSIS — I5032 Chronic diastolic (congestive) heart failure: Secondary | ICD-10-CM | POA: Insufficient documentation

## 2015-01-02 ENCOUNTER — Ambulatory Visit: Payer: Self-pay | Admitting: Family Medicine

## 2015-06-07 ENCOUNTER — Encounter: Payer: Self-pay | Admitting: Emergency Medicine

## 2015-06-07 ENCOUNTER — Inpatient Hospital Stay
Admission: EM | Admit: 2015-06-07 | Discharge: 2015-06-10 | DRG: 190 | Disposition: A | Discharge status: Home / Self Care | Payer: Medicaid Other | Attending: Internal Medicine | Admitting: Internal Medicine

## 2015-06-07 ENCOUNTER — Emergency Department: Payer: Medicaid Other

## 2015-06-07 DIAGNOSIS — E119 Type 2 diabetes mellitus without complications: Secondary | ICD-10-CM | POA: Diagnosis present

## 2015-06-07 DIAGNOSIS — J441 Chronic obstructive pulmonary disease with (acute) exacerbation: Secondary | ICD-10-CM | POA: Diagnosis not present

## 2015-06-07 DIAGNOSIS — J9622 Acute and chronic respiratory failure with hypercapnia: Secondary | ICD-10-CM | POA: Diagnosis present

## 2015-06-07 DIAGNOSIS — J9602 Acute respiratory failure with hypercapnia: Secondary | ICD-10-CM

## 2015-06-07 DIAGNOSIS — I10 Essential (primary) hypertension: Secondary | ICD-10-CM | POA: Diagnosis present

## 2015-06-07 DIAGNOSIS — Z7984 Long term (current) use of oral hypoglycemic drugs: Secondary | ICD-10-CM

## 2015-06-07 DIAGNOSIS — J9621 Acute and chronic respiratory failure with hypoxia: Secondary | ICD-10-CM | POA: Diagnosis present

## 2015-06-07 DIAGNOSIS — F17210 Nicotine dependence, cigarettes, uncomplicated: Secondary | ICD-10-CM | POA: Diagnosis present

## 2015-06-07 DIAGNOSIS — Z79899 Other long term (current) drug therapy: Secondary | ICD-10-CM | POA: Diagnosis not present

## 2015-06-07 DIAGNOSIS — Z8049 Family history of malignant neoplasm of other genital organs: Secondary | ICD-10-CM

## 2015-06-07 DIAGNOSIS — Z9981 Dependence on supplemental oxygen: Secondary | ICD-10-CM | POA: Diagnosis not present

## 2015-06-07 DIAGNOSIS — Z79891 Long term (current) use of opiate analgesic: Secondary | ICD-10-CM

## 2015-06-07 DIAGNOSIS — Z800 Family history of malignant neoplasm of digestive organs: Secondary | ICD-10-CM

## 2015-06-07 DIAGNOSIS — J189 Pneumonia, unspecified organism: Secondary | ICD-10-CM | POA: Diagnosis present

## 2015-06-07 DIAGNOSIS — E871 Hypo-osmolality and hyponatremia: Secondary | ICD-10-CM | POA: Diagnosis present

## 2015-06-07 DIAGNOSIS — F419 Anxiety disorder, unspecified: Secondary | ICD-10-CM | POA: Diagnosis present

## 2015-06-07 DIAGNOSIS — J9601 Acute respiratory failure with hypoxia: Secondary | ICD-10-CM

## 2015-06-07 DIAGNOSIS — R0600 Dyspnea, unspecified: Secondary | ICD-10-CM | POA: Diagnosis present

## 2015-06-07 HISTORY — DX: Chronic obstructive pulmonary disease, unspecified: J44.9

## 2015-06-07 HISTORY — DX: Anxiety disorder, unspecified: F41.9

## 2015-06-07 HISTORY — DX: Essential (primary) hypertension: I10

## 2015-06-07 HISTORY — DX: Type 2 diabetes mellitus without complications: E11.9

## 2015-06-07 HISTORY — DX: Unspecified asthma, uncomplicated: J45.909

## 2015-06-07 LAB — CBC
HEMATOCRIT: 43.6 % (ref 40.0–52.0)
HEMOGLOBIN: 14.3 g/dL (ref 13.0–18.0)
MCH: 29.3 pg (ref 26.0–34.0)
MCHC: 32.9 g/dL (ref 32.0–36.0)
MCV: 89.2 fL (ref 80.0–100.0)
Platelets: 202 10*3/uL (ref 150–440)
RBC: 4.89 MIL/uL (ref 4.40–5.90)
RDW: 13.6 % (ref 11.5–14.5)
WBC: 8 10*3/uL (ref 3.8–10.6)

## 2015-06-07 LAB — BASIC METABOLIC PANEL
Anion gap: 8 (ref 5–15)
BUN: 9 mg/dL (ref 6–20)
CALCIUM: 9.2 mg/dL (ref 8.9–10.3)
CO2: 41 mmol/L — ABNORMAL HIGH (ref 22–32)
CREATININE: 0.71 mg/dL (ref 0.61–1.24)
Chloride: 90 mmol/L — ABNORMAL LOW (ref 101–111)
GFR calc non Af Amer: >60 mL/min (ref >60)
Glucose, Bld: 149 mg/dL — ABNORMAL HIGH (ref 65–99)
Potassium: 4.1 mmol/L (ref 3.5–5.1)
SODIUM: 139 mmol/L (ref 135–145)

## 2015-06-07 LAB — URINALYSIS COMPLETE WITH MICROSCOPIC (ARMC ONLY)
BACTERIA UA: NONE SEEN
Bilirubin Urine: NEGATIVE
Glucose, UA: 50 mg/dL — AB
HGB URINE DIPSTICK: NEGATIVE
Ketones, ur: NEGATIVE mg/dL
LEUKOCYTES UA: NEGATIVE
NITRITE: NEGATIVE
PROTEIN: NEGATIVE mg/dL
SPECIFIC GRAVITY, URINE: 1.009 (ref 1.005–1.030)
pH: 8 (ref 5.0–8.0)

## 2015-06-07 LAB — BLOOD GAS, VENOUS
Acid-Base Excess: 15.6 mmol/L — ABNORMAL HIGH (ref 0.0–3.0)
Bicarbonate: 44.6 mEq/L — ABNORMAL HIGH (ref 21.0–28.0)
O2 Saturation: 80.7 %
PCO2 VEN: 72 mmHg — AB (ref 44.0–60.0)
PH VEN: 7.4 (ref 7.320–7.430)
Patient temperature: 37
pO2, Ven: 45 mmHg (ref 31.0–45.0)

## 2015-06-07 LAB — GLUCOSE, CAPILLARY: GLUCOSE-CAPILLARY: 209 mg/dL — AB (ref 65–99)

## 2015-06-07 MED ORDER — OXYCODONE HCL 5 MG PO TABS
ORAL_TABLET | ORAL | Status: AC
  Filled 2015-06-07: qty 1

## 2015-06-07 MED ORDER — OXYCODONE HCL 5 MG PO TABS
ORAL_TABLET | ORAL | Status: AC
Start: 2015-06-07 — End: 2015-06-07
  Administered 2015-06-07: 5 mg via ORAL
  Filled 2015-06-07: qty 1

## 2015-06-07 MED ORDER — LEVOFLOXACIN IN D5W 750 MG/150ML IV SOLN
750.0000 mg | Freq: Once | INTRAVENOUS | Status: AC
  Administered 2015-06-08: 750 mg via INTRAVENOUS
  Filled 2015-06-07: qty 150

## 2015-06-07 MED ORDER — OXYCODONE HCL 5 MG PO TABS
5.0000 mg | ORAL_TABLET | ORAL | Status: AC
  Administered 2015-06-07: 5 mg via ORAL

## 2015-06-07 MED ORDER — IPRATROPIUM-ALBUTEROL 0.5-2.5 (3) MG/3ML IN SOLN
3.0000 mL | Freq: Once | RESPIRATORY_TRACT | Status: AC
  Administered 2015-06-07: 3 mL via RESPIRATORY_TRACT
  Filled 2015-06-07: qty 3

## 2015-06-07 MED ORDER — OXYCODONE HCL 5 MG PO CAPS: 5.0000 mg | ORAL_CAPSULE | ORAL | Status: DC | PRN

## 2015-06-07 MED ORDER — METHYLPREDNISOLONE SODIUM SUCC 125 MG IJ SOLR
60.0000 mg | Freq: Four times a day (QID) | INTRAMUSCULAR | Status: DC
  Administered 2015-06-08 (×2): 60 mg via INTRAVENOUS
  Filled 2015-06-07 (×2): qty 2

## 2015-06-07 MED ORDER — MAGNESIUM SULFATE 2 GM/50ML IV SOLN
2.0000 g | Freq: Once | INTRAVENOUS | Status: AC
  Administered 2015-06-07: 2 g via INTRAVENOUS
  Filled 2015-06-07: qty 50

## 2015-06-07 MED ORDER — SODIUM CHLORIDE 0.9 % IV BOLUS (SEPSIS): 1000.0000 mL | Freq: Once | INTRAVENOUS | Status: DC

## 2015-06-07 MED ORDER — IPRATROPIUM-ALBUTEROL 0.5-2.5 (3) MG/3ML IN SOLN
3.0000 mL | Freq: Four times a day (QID) | RESPIRATORY_TRACT | Status: DC
  Administered 2015-06-08 – 2015-06-10 (×9): 3 mL via RESPIRATORY_TRACT
  Filled 2015-06-07 (×11): qty 3

## 2015-06-07 MED ORDER — INSULIN ASPART 100 UNIT/ML ~~LOC~~ SOLN
0.0000 [IU] | Freq: Every day | SUBCUTANEOUS | Status: DC
  Administered 2015-06-07: 2 [IU] via SUBCUTANEOUS
  Administered 2015-06-08: 3 [IU] via SUBCUTANEOUS
  Filled 2015-06-07: qty 3
  Filled 2015-06-07: qty 2
  Filled 2015-06-07: qty 3

## 2015-06-07 MED ORDER — OXYCODONE HCL 5 MG PO TABS: 5.0000 mg | ORAL_TABLET | ORAL | Status: DC | PRN

## 2015-06-07 MED ORDER — IOPAMIDOL (ISOVUE-370) INJECTION 76%
100.0000 mL | Freq: Once | INTRAVENOUS | Status: AC | PRN
  Administered 2015-06-07: 100 mL via INTRAVENOUS

## 2015-06-07 MED ORDER — METHYLPREDNISOLONE SODIUM SUCC 125 MG IJ SOLR
125.0000 mg | Freq: Once | INTRAMUSCULAR | Status: AC
  Administered 2015-06-07: 125 mg via INTRAVENOUS
  Filled 2015-06-07: qty 2

## 2015-06-07 MED ORDER — OXYCODONE HCL 5 MG PO TABS
5.0000 mg | ORAL_TABLET | ORAL | Status: DC | PRN
  Administered 2015-06-07 – 2015-06-10 (×11): 5 mg via ORAL
  Filled 2015-06-07 (×10): qty 1

## 2015-06-07 MED ORDER — INSULIN ASPART 100 UNIT/ML ~~LOC~~ SOLN
0.0000 [IU] | Freq: Three times a day (TID) | SUBCUTANEOUS | Status: DC
  Administered 2015-06-08: 8 [IU] via SUBCUTANEOUS
  Administered 2015-06-08: 2 [IU] via SUBCUTANEOUS
  Administered 2015-06-08: 3 [IU] via SUBCUTANEOUS
  Administered 2015-06-09: 2 [IU] via SUBCUTANEOUS
  Administered 2015-06-09 (×2): 8 [IU] via SUBCUTANEOUS
  Administered 2015-06-10: 3 [IU] via SUBCUTANEOUS
  Filled 2015-06-07: qty 3
  Filled 2015-06-07 (×2): qty 8
  Filled 2015-06-07: qty 2
  Filled 2015-06-07: qty 8
  Filled 2015-06-07: qty 2

## 2015-06-07 MED ORDER — OXYCODONE HCL ER 20 MG PO T12A
20.0000 mg | EXTENDED_RELEASE_TABLET | Freq: Two times a day (BID) | ORAL | Status: DC
  Administered 2015-06-07 – 2015-06-10 (×6): 20 mg via ORAL
  Filled 2015-06-07 (×5): qty 1
  Filled 2015-06-07: qty 2

## 2015-06-07 MED ORDER — ALBUTEROL SULFATE (2.5 MG/3ML) 0.083% IN NEBU
5.0000 mg | INHALATION_SOLUTION | Freq: Once | RESPIRATORY_TRACT | Status: AC
  Administered 2015-06-07: 5 mg via RESPIRATORY_TRACT
  Filled 2015-06-07: qty 6

## 2015-06-07 MED ORDER — DOXYCYCLINE HYCLATE 100 MG PO TABS
100.0000 mg | ORAL_TABLET | Freq: Two times a day (BID) | ORAL | Status: DC
Start: 2015-06-07 — End: 2015-06-07
  Administered 2015-06-07: 100 mg via ORAL
  Filled 2015-06-07: qty 1

## 2015-06-07 NOTE — Progress Notes (Signed)
Pharmacy Antibiotic Note  Craig Wright is a 50 y.o. male admitted on 06/07/2015 with pneumonia.  Pharmacy has been consulted for levofloxacin dosing.  Plan: Levofloxacin 750 mg IV Q24H  Height: 6' (182.9 cm) Weight: 208 lb (94.348 kg) IBW/kg (Calculated) : 77.6  Temp (24hrs), Avg:98.2 F (36.8 C), Min:98.2 F (36.8 C), Max:98.2 F (36.8 C)   Recent Labs Lab 06/07/15 1611  WBC 8.0  CREATININE 0.71    Estimated Creatinine Clearance: 130.3 mL/min (by C-G formula based on Cr of 0.71).    No Known Allergies  Thank you for allowing pharmacy to be a part of this patient's care.  Laural Benes, Pharm.D., BCPS Clinical Pharmacist 06/07/2015 11:45 PM

## 2015-06-07 NOTE — H&P (Signed)
[facility] NAME: Craig Wright    MR#:  [id number]  DATE OF BIRTH:  [birth year]  DATE OF ADMISSION:  06/07/2015  PRIMARY CARE PHYSICIAN: No primary care provider on file.   REQUESTING/REFERRING PHYSICIAN:   CHIEF COMPLAINT:   Chief Complaint  Patient presents with  . Weakness  . Cough    HISTORY OF PRESENT ILLNESS: Craig Wright  is a 50 y.o. male with a known history of COPD on home oxygen at 5 L by nasal cannula, chronic respiratory failure, diabetes mellitus, hypertension presented to the emergency room with cough and difficulty breathing for one week. Patient's cough is nonproductive in nature. Patient uses home oxygen has been short of breath for the last couple of days. When he presented to the emergency room he was put on BiPAP 7 stabilized and later on weaned to oxygen via nasal cannula at 6 L. Patient received oral Levaquin antibiotic as outpatient and recently. No history of orthopnea . Has some chest discomfort whenever he takes a deep breath. Patient was worked up with a CT angiogram of the chest which showed no pulmonary embolism, COPD changes noted.  PAST MEDICAL HISTORY:   Past Medical History  Diagnosis Date  . Diabetes mellitus without complication ([place])   . Asthma   . Hypertension   . COPD (chronic obstructive pulmonary disease) ([place])   . Anxiety disorder     PAST SURGICAL HISTORY: Past Surgical History  Procedure Laterality Date  . Orthopedic surgery      multiple    SOCIAL HISTORY:  Social History  Substance Use Topics  . Smoking status: Current Some Day Smoker  . Smokeless tobacco: Not on file  . Alcohol Use: 0.0 oz/week    0 Standard drinks or equivalent per week    FAMILY HISTORY:  Family History  Problem Relation Age of Onset  . Cervical cancer Mother   . Colon cancer Father     DRUG ALLERGIES: No Known Allergies  REVIEW OF SYSTEMS:   CONSTITUTIONAL: No fever, fatigue or  weakness.  EYES: No blurred or double vision.  EARS, NOSE, AND THROAT: No tinnitus or ear pain.  RESPIRATORY: Has cough,has shortness of breath, no hemoptysis.  CARDIOVASCULAR: No chest pain, orthopnea, edema.  GASTROINTESTINAL: No nausea, vomiting, diarrhea or abdominal pain.  GENITOURINARY: No dysuria, hematuria.  ENDOCRINE: No polyuria, nocturia,  HEMATOLOGY: No anemia, easy bruising or bleeding SKIN: No rash or lesion. MUSCULOSKELETAL: No joint pain or arthritis.   NEUROLOGIC: No tingling, numbness, weakness.  PSYCHIATRY: No anxiety or depression.   MEDICATIONS AT HOME:  Prior to Admission medications   Medication Sig Start Date End Date Taking? Authorizing Provider  clonazePAM (KLONOPIN) 1 MG tablet Take 1 mg by mouth 2 (two) times daily.   Yes Historical Provider, MD  DULoxetine (CYMBALTA) 60 MG capsule Take 120 mg by mouth daily.   Yes Historical Provider, MD  furosemide (LASIX) 20 MG tablet Take 20 mg by mouth daily.   Yes Historical Provider, MD  metFORMIN (GLUMETZA) 500 MG (MOD) 24 hr tablet Take 500 mg by mouth daily with breakfast.   Yes Historical Provider, MD  oxycodone (OXY-IR) 5 MG capsule Take 5 mg by mouth every 4 (four) hours as needed.   Yes Historical Provider, MD  oxyCODONE (OXYCONTIN) 20 mg 12 hr tablet Take 20 mg by mouth every 12 (twelve) hours.   Yes Historical Provider, MD      PHYSICAL EXAMINATION:   VITAL  SIGNS: Blood pressure 124/81, pulse 106, temperature 98.2 F (36.8 C), temperature source Oral, resp. rate 22, height 6' (1.829 m), weight 94.348 kg (208 lb), SpO2 91 %.  GENERAL:  50 y.o.-year-old patient lying in the bed with no acute distress.  EYES: Pupils equal, round, reactive to light and accommodation. No scleral icterus. Extraocular muscles intact.  HEENT: Head atraumatic, normocephalic. Oropharynx and nasopharynx clear.  NECK:  Supple, no jugular venous distention. No thyroid enlargement, no tenderness.  LUNGS: Decreased breath sounds  bilaterally, wheezing in both lung fields. No use of accessory muscles of respiration.  CARDIOVASCULAR: S1, S2 normal. No murmurs, rubs, or gallops.  ABDOMEN: Soft, nontender, nondistended. Bowel sounds present. No organomegaly or mass.  EXTREMITIES: No pedal edema, cyanosis, or clubbing.  NEUROLOGIC: Cranial nerves II through XII are intact. Muscle strength 5/5 in all extremities. Sensation intact. Gait not checked.  PSYCHIATRIC: The patient is alert and oriented x 3.  SKIN: No obvious rash, lesion, or ulcer.   LABORATORY PANEL:   CBC  Recent Labs Lab 06/07/15 1611  WBC 8.0  HGB 14.3  HCT 43.6  PLT 202  MCV 89.2  MCH 29.3  MCHC 32.9  RDW 13.6   ------------------------------------------------------------------------------------------------------------------  Chemistries   Recent Labs Lab 06/07/15 1611  NA 139  K 4.1  CL 90*  CO2 41*  GLUCOSE 149*  BUN 9  CREATININE 0.71  CALCIUM 9.2   ------------------------------------------------------------------------------------------------------------------ estimated creatinine clearance is 130.3 mL/min (by C-G formula based on Cr of 0.71). ------------------------------------------------------------------------------------------------------------------ No results for input(s): TSH, T4TOTAL, T3FREE, THYROIDAB in the last 72 hours.  Invalid input(s): FREET3   Coagulation profile No results for input(s): INR, PROTIME in the last 168 hours. ------------------------------------------------------------------------------------------------------------------- No results for input(s): DDIMER in the last 72 hours. -------------------------------------------------------------------------------------------------------------------  Cardiac Enzymes No results for input(s): CKMB, TROPONINI, MYOGLOBIN in the last 168 hours.  Invalid input(s):  CK ------------------------------------------------------------------------------------------------------------------ Invalid input(s): POCBNP  ---------------------------------------------------------------------------------------------------------------  Urinalysis    Component Value Date/Time   COLORURINE YELLOW* 06/07/2015 1939   COLORURINE Yellow 12/26/2012 1502   APPEARANCEUR CLEAR* 06/07/2015 1939   APPEARANCEUR Clear 12/26/2012 1502   LABSPEC 1.009 06/07/2015 1939   LABSPEC 1.011 12/26/2012 1502   PHURINE 8.0 06/07/2015 1939   PHURINE 7.0 12/26/2012 1502   GLUCOSEU 50* 06/07/2015 1939   GLUCOSEU Negative 12/26/2012 1502   HGBUR NEGATIVE 06/07/2015 1939   HGBUR Negative 12/26/2012 1502   BILIRUBINUR NEGATIVE 06/07/2015 1939   BILIRUBINUR Negative 12/26/2012 1502   KETONESUR NEGATIVE 06/07/2015 1939   KETONESUR Negative 12/26/2012 1502   PROTEINUR NEGATIVE 06/07/2015 1939   PROTEINUR Negative 12/26/2012 1502   NITRITE NEGATIVE 06/07/2015 1939   NITRITE Negative 12/26/2012 1502   LEUKOCYTESUR NEGATIVE 06/07/2015 1939   LEUKOCYTESUR Negative 12/26/2012 1502     RADIOLOGY: Dg Chest 2 View  06/07/2015  CLINICAL DATA:  Patient with congestion, dizziness and fatigue. EXAM: CHEST  2 VIEW COMPARISON:  Chest radiograph 07/29/2012 FINDINGS: Stable cardiac and mediastinal contours. No consolidative pulmonary opacities. No pleural effusion or pneumothorax. Regional skeleton is unremarkable. IMPRESSION: No active cardiopulmonary disease. Electronically Signed   By: Lovey Newcomer M.D.   On: 06/07/2015 16:59   Ct Angio Chest Pe W/cm &/or Wo Cm  06/07/2015  CLINICAL DATA:  In difficulty breathing. EXAM: CT ANGIOGRAPHY CHEST WITH CONTRAST TECHNIQUE: Multidetector CT imaging of the chest was performed using the standard protocol during bolus administration of intravenous contrast. Multiplanar CT image reconstructions and MIPs were obtained to evaluate the vascular anatomy. CONTRAST:  100 cc  Isovue 370. COMPARISON:  None. FINDINGS: Mediastinum/Nodes: Respiratory motion at the lung bases is somewhat limiting for detection of segmental and subsegmental pulmonary emboli. Otherwise, no pulmonary emboli. No pathologically enlarged mediastinal, hilar or axillary lymph nodes. Esophagus is somewhat dilated air-filled, suggesting dysmotility. Heart size normal. No pericardial effusion. Lungs/Pleura: Centrilobular and paraseptal emphysema. Image quality is degraded by respiratory motion. There is spiculated thickening along the periphery of a focal bed of emphysema in the medial right lower lobe. Lesion measures 2.0 cm (series 6, image 89). Scattered peribronchovascular nodularity in the left upper lobe and to a lesser extent, left lower lobe. No pleural fluid. Airway is unremarkable. Upper abdomen: A blush of hyper attenuation along the hepatic dome (series 4, image 121) may represent a flash fill hemangioma or perfusion anomaly. Visualized portions of the liver are otherwise unremarkable. There may be tiny stones in the gallbladder. Visualized portions of the adrenal glands, left kidney, spleen, pancreas, stomach and bowel are grossly unremarkable. Musculoskeletal: No worrisome lytic or sclerotic lesions. Review of the MIP images confirms the above findings. IMPRESSION: 1. Assessment for basilar segmental and subsegmental pulmonary emboli is limited by respiratory motion. Otherwise, no pulmonary embolus. 2. Peribronchovascular nodularity in the left upper and left lower lobes, indicative of an infectious bronchiolitis. 3. Irregular peripheral thickening along a focal bed of emphysema in the right lower lobe. While this may be infectious or inflammatory in etiology, adenocarcinoma can also have this appearance. Follow-up CT chest without contrast is recommended in 3-4 weeks to ensure resolution. 4. Question cholelithiasis. Electronically Signed   By: Lorin Picket M.D.   On: 06/07/2015 20:12    EKG: Orders  placed or performed during the hospital encounter of 06/07/15  . ED EKG  . ED EKG  . EKG 12-Lead  . EKG 12-Lead    IMPRESSION AND PLAN: 50 year old male patient with history of COPD on home oxygen, diabetes mellitus, hypertension presented to the emergency room with increased shortness of breath and cough. Admitting diagnosis 1. Acute on chronic respiratory failure 2. Hypercapnia 3. Acute COPD exacerbation 4. Hypertension 5. Diabetes mellitus2 Treatment plan Admit patient to telemetry Continue oxygen via nasal cannula at 6 L Breathing treatments around the clock IV Solu-Medrol 60 mg every 6 hourly Start patient on IV Levaquin antibiotic 500 MG daily Diabetes management with oral metformin and sliding scale coverage insulin. Supportive care.  All the records are reviewed and case discussed with ED provider. Management plans discussed with the patient, family and they are in agreement.  CODE STATUS:FULL Code Status History    This patient does not have a recorded code status. Please follow your organizational policy for patients in this situation.       TOTAL TIME TAKING CARE OF THIS PATIENT: 55 minutes.    Saundra Shelling M.D on 06/07/2015 at 11:32 PM  Between 7am to 6pm - Pager - [phone]  After 6pm go to [email or web address] - password [email or web address] [facility]  [facility]  Office  [phone]  CC: Primary care physician; No primary care provider on file.

## 2015-06-07 NOTE — ED Notes (Signed)
Pt taken to CT.

## 2015-06-07 NOTE — ED Notes (Signed)
Patient states that he recently finished antibiotics for respiratory infection, this morning when he woke up he was having difficulty breathing. Patient states that he is feeling some better now. Takes prednisone daily. On Chronic O2 at home at 5L.

## 2015-06-07 NOTE — ED Notes (Signed)
RT at bedside.

## 2015-06-07 NOTE — ED Notes (Signed)
Pt reports URI symptoms x4 days, reports weakness today. Pt reports hx of COPD, is on 5L of home oxygen.

## 2015-06-07 NOTE — ED Notes (Signed)
Pt returned from CT °

## 2015-06-07 NOTE — ED Notes (Signed)
Pt reports some improvement in breathing.  Right lung sounds diminished with expiratory wheezes, left lung has exp. wheezes

## 2015-06-07 NOTE — Progress Notes (Signed)
Patient has been taken off bipap and placed back on nasal cannula per verbal order Dr Joni Fears

## 2015-06-07 NOTE — ED Provider Notes (Signed)
[facility] Emergency Department Provider Note  ____________________________________________  Time seen: 6:50 PM  I have reviewed the triage vital signs and the nursing notes.   HISTORY  Chief Complaint Weakness and Cough    HPI Craig Wright is a 50 y.o. male complains of cough and shortness of breath is been on for the past week. He was started on Levaquin and steroids outpatient which she is completed the Levaquin and still on steroids but his symptoms have not appreciably improved. In addition today he started having some chest pain in the central chest, hurts to breathe, nonradiating, sharp and heavy. No aggravating or alleviating factors. Constant today.  Cough is nonproductive. No trauma. No fever.  No recent travel trauma or surgery. No history of DVT or PE. He has had multiple hospitalizations in recent months for COPD.   Past Medical History  Diagnosis Date  . Diabetes mellitus without complication ([place])      There are no active problems to display for this patient.    Past Surgical History  Procedure Laterality Date  . Orthopedic surgery      multiple     Current Outpatient Rx  Name  Route  Sig  Dispense  Refill  . clonazePAM (KLONOPIN) 1 MG tablet   Oral   Take 1 mg by mouth 2 (two) times daily.         . DULoxetine (CYMBALTA) 60 MG capsule   Oral   Take 120 mg by mouth daily.         . furosemide (LASIX) 20 MG tablet   Oral   Take 20 mg by mouth daily.         . metFORMIN (GLUMETZA) 500 MG (MOD) 24 hr tablet   Oral   Take 500 mg by mouth daily with breakfast.         . oxycodone (OXY-IR) 5 MG capsule   Oral   Take 5 mg by mouth every 4 (four) hours as needed.         Marland Kitchen oxyCODONE (OXYCONTIN) 20 mg 12 hr tablet   Oral   Take 20 mg by mouth every 12 (twelve) hours.            Allergies Review of patient's allergies indicates no known allergies.   No family history on file.  Social  History Social History  Substance Use Topics  . Smoking status: Current Some Day Smoker  . Smokeless tobacco: None  . Alcohol Use: No    Review of Systems  Constitutional:   No fever or chills.  Eyes:   No vision changes.  ENT:   No sore throat. No rhinorrhea. Cardiovascular:   Positive as above chest pain. Respiratory:   Positive shortness of breath and cough. Gastrointestinal:   Negative for abdominal pain, vomiting and diarrhea.  No bloody stool. Genitourinary:   Negative for dysuria or difficulty urinating. Musculoskeletal:   Negative for focal pain or swelling Neurological:   Negative for headaches 10-point ROS otherwise negative.  ____________________________________________   PHYSICAL EXAM:  VITAL SIGNS: ED Triage Vitals  Enc Vitals Group     BP 06/07/15 1607 123/83 mmHg     Pulse Rate 06/07/15 1607 115     Resp 06/07/15 1607 20     Temp 06/07/15 1607 98.2 F (36.8 C)     Temp Source 06/07/15 1607 Oral     SpO2 06/07/15 1607 95 %     Weight 06/07/15 1607 208 lb (94.348 kg)  Height 06/07/15 1607 6' (1.829 m)     Head Cir --      Peak Flow --      Pain Score 06/07/15 1607 7     Pain Loc --      Pain Edu? --      Excl. in [place]? --     Vital signs reviewed, nursing assessments reviewed.   Constitutional:   Alert and oriented. Well appearing and in no distress. Eyes:   No scleral icterus. No conjunctival pallor. PERRL. EOMI ENT   Head:   Normocephalic and atraumatic.   Nose:   No congestion/rhinnorhea. No septal hematoma   Mouth/Throat:   MMM, no pharyngeal erythema. No peritonsillar mass.    Neck:   No stridor. No SubQ emphysema. No meningismus. Hematological/Lymphatic/Immunilogical:   No cervical lymphadenopathy. Cardiovascular:   Tachycardia heart rate 110. Symmetric bilateral radial and DP pulses.  No murmurs.  Respiratory:   Mild tachypnea. Slightly increased work of breathing. Diffuse expiratory wheezing with mildly prolonged expiratory  phase. No focal consolidative findings.. Gastrointestinal:   Soft and nontender. Non distended. There is no CVA tenderness.  No rebound, rigidity, or guarding. Genitourinary:   deferred Musculoskeletal:   Nontender with normal range of motion in all extremities. No joint effusions.  No lower extremity tenderness.  No edema. Neurologic:   Normal speech and language.  CN 2-10 normal. Motor grossly intact. No gross focal neurologic deficits are appreciated.  Skin:    Skin is warm, dry and intact. No rash noted.  No petechiae, purpura, or bullae.  ____________________________________________    LABS (pertinent positives/negatives) (all labs ordered are listed, but only abnormal results are displayed) Labs Reviewed  BASIC METABOLIC PANEL - Abnormal; Notable for the following:    Chloride 90 (*)    CO2 41 (*)    Glucose, Bld 149 (*)    All other components within normal limits  URINALYSIS COMPLETEWITH MICROSCOPIC ([facility] ONLY) - Abnormal; Notable for the following:    Color, Urine YELLOW (*)    APPearance CLEAR (*)    Glucose, UA 50 (*)    Squamous Epithelial / LPF 0-5 (*)    All other components within normal limits  BLOOD GAS, VENOUS - Abnormal; Notable for the following:    pCO2, Ven 72 (*)    Bicarbonate 44.6 (*)    Acid-Base Excess 15.6 (*)    All other components within normal limits  CBC   ____________________________________________   EKG  Interpreted by me Sinus tachycardia rate 119. Right axis, normal intervals. Normal QRS ST segments and T waves.  ____________________________________________    RADIOLOGY  Chest x-ray unremarkable CT angiogram chest negative for PE. Does show diffuse peribronchial vascular nodularity consistent with infectious bronchiolitis.  ____________________________________________   PROCEDURES CRITICAL CARE Performed by: Joni Fears, [facility]   Total critical care time: 35 minutes  Critical care time was exclusive of separately  billable procedures and treating other patients.  Critical care was necessary to treat or prevent imminent or life-threatening deterioration.  Critical care was time spent personally by me on the following activities: development of treatment plan with patient and/or surrogate as well as nursing, discussions with consultants, evaluation of patient's response to treatment, examination of patient, obtaining history from patient or surrogate, ordering and performing treatments and interventions, ordering and review of laboratory studies, ordering and review of radiographic studies, pulse oximetry and re-evaluation of patient's condition.   ____________________________________________   INITIAL IMPRESSION / ASSESSMENT AND PLAN / ED COURSE  Pertinent labs &  imaging results that were available during my care of the patient were reviewed by me and considered in my medical decision making (see chart for details).  Patient presents with persistent COPD exacerbation and new onset of pleuritic chest pain in the setting of recent treatment with steroids and Levaquin. With the chest pain history and recent hospitalizations, who performed a CT angiogram of the chest to evaluate for PE. Patient was also given 3 nebulizer treatments and IV magnesium for bronchodilation. At the same time labs resulted showing hypercapnic respiratory failure with a PCO2 in the 70s. We place the patient on BiPAP to improve his ventilation. Oxygenation remained stable at about 90% on 5-6 L nasal cannula. After about an hour to an hour and a half of BiPAP, reassessment at 10 PM shows that the patient is sleeping, breathing comfortably, wheezing resolved. We discontinued the BiPAP and he remained comfortable with his breathing. I did give him another dose of steroids with 125 mg of Solu-Medrol IV. Gave him a fourth neb as well.  At this point it appears that he is having a persistent COPD exacerbation with an infectious pneumonia process  that is failed outpatient treatment. He is looking much improved after multiple medications and BiPAP in the emergency department, but with the finding of hypercapnia I think he still warrants hospitalization for further nebulizer treatment steroids antibiotics and ensuring that he continues to improve as he is at risk of decompensation with his severe underlying disease.     ____________________________________________   FINAL CLINICAL IMPRESSION(S) / ED DIAGNOSES  Final diagnoses:  COPD with exacerbation ([place])  Community acquired pneumonia  Acute on chronic hypercapnic and hypoxic respiratory failure     Portions of this note were generated with dragon dictation software. Dictation errors may occur despite best attempts at proofreading.   Carrie Mew, MD 06/07/15 2251

## 2015-06-08 LAB — CBC
HCT: 41 % (ref 40.0–52.0)
HEMOGLOBIN: 13.6 g/dL (ref 13.0–18.0)
MCH: 29.6 pg (ref 26.0–34.0)
MCHC: 33.2 g/dL (ref 32.0–36.0)
MCV: 89.1 fL (ref 80.0–100.0)
PLATELETS: 176 10*3/uL (ref 150–440)
RBC: 4.6 MIL/uL (ref 4.40–5.90)
RDW: 13.8 % (ref 11.5–14.5)
WBC: 6.8 10*3/uL (ref 3.8–10.6)

## 2015-06-08 LAB — GLUCOSE, CAPILLARY
GLUCOSE-CAPILLARY: 166 mg/dL — AB (ref 65–99)
GLUCOSE-CAPILLARY: 300 mg/dL — AB (ref 65–99)
Glucose-Capillary: 247 mg/dL — ABNORMAL HIGH (ref 65–99)
Glucose-Capillary: 193 mg/dL — ABNORMAL HIGH (ref 65–99)
Glucose-Capillary: 254 mg/dL — ABNORMAL HIGH (ref 65–99)
Glucose-Capillary: 150 mg/dL — ABNORMAL HIGH (ref 65–99)

## 2015-06-08 LAB — BASIC METABOLIC PANEL
ANION GAP: 6 (ref 5–15)
BUN: 10 mg/dL (ref 6–20)
CALCIUM: 8.7 mg/dL — AB (ref 8.9–10.3)
CO2: 38 mmol/L — ABNORMAL HIGH (ref 22–32)
CREATININE: 0.64 mg/dL (ref 0.61–1.24)
Chloride: 90 mmol/L — ABNORMAL LOW (ref 101–111)
GFR calc Af Amer: >60 mL/min (ref >60)
GLUCOSE: 242 mg/dL — AB (ref 65–99)
Potassium: 4.1 mmol/L (ref 3.5–5.1)
Sodium: 134 mmol/L — ABNORMAL LOW (ref 135–145)

## 2015-06-08 LAB — MRSA PCR SCREENING: MRSA BY PCR: NEGATIVE

## 2015-06-08 MED ORDER — ENOXAPARIN SODIUM 40 MG/0.4ML ~~LOC~~ SOLN
40.0000 mg | Freq: Every day | SUBCUTANEOUS | Status: DC
  Administered 2015-06-08 – 2015-06-09 (×3): 40 mg via SUBCUTANEOUS
  Filled 2015-06-08 (×3): qty 0.4

## 2015-06-08 MED ORDER — LEVOFLOXACIN IN D5W 750 MG/150ML IV SOLN
750.0000 mg | INTRAVENOUS | Status: DC
  Administered 2015-06-08: 750 mg via INTRAVENOUS
  Filled 2015-06-08 (×2): qty 150

## 2015-06-08 MED ORDER — METFORMIN HCL ER 500 MG PO TB24
500.0000 mg | ORAL_TABLET | Freq: Every day | ORAL | Status: DC
  Administered 2015-06-08 – 2015-06-10 (×3): 500 mg via ORAL
  Filled 2015-06-08 (×3): qty 1

## 2015-06-08 MED ORDER — ACETAMINOPHEN 650 MG RE SUPP: 650.0000 mg | Freq: Four times a day (QID) | RECTAL | Status: DC | PRN

## 2015-06-08 MED ORDER — ACETAMINOPHEN 325 MG PO TABS: 650.0000 mg | ORAL_TABLET | Freq: Four times a day (QID) | ORAL | Status: DC | PRN

## 2015-06-08 MED ORDER — DULOXETINE HCL 30 MG PO CPEP
120.0000 mg | ORAL_CAPSULE | Freq: Every day | ORAL | Status: DC
  Administered 2015-06-08 – 2015-06-10 (×3): 120 mg via ORAL
  Filled 2015-06-08 (×3): qty 4

## 2015-06-08 MED ORDER — CLONAZEPAM 1 MG PO TABS
1.0000 mg | ORAL_TABLET | Freq: Two times a day (BID) | ORAL | Status: DC
  Administered 2015-06-08 – 2015-06-10 (×6): 1 mg via ORAL
  Filled 2015-06-08 (×6): qty 1

## 2015-06-08 MED ORDER — SODIUM CHLORIDE 0.9% FLUSH
3.0000 mL | Freq: Two times a day (BID) | INTRAVENOUS | Status: DC
  Administered 2015-06-08 – 2015-06-10 (×5): 3 mL via INTRAVENOUS

## 2015-06-08 MED ORDER — ONDANSETRON HCL 4 MG PO TABS: 4.0000 mg | ORAL_TABLET | Freq: Four times a day (QID) | ORAL | Status: DC | PRN

## 2015-06-08 MED ORDER — METHYLPREDNISOLONE SODIUM SUCC 125 MG IJ SOLR
60.0000 mg | INTRAMUSCULAR | Status: DC
  Administered 2015-06-09 – 2015-06-10 (×2): 60 mg via INTRAVENOUS
  Filled 2015-06-08 (×2): qty 2

## 2015-06-08 MED ORDER — FUROSEMIDE 20 MG PO TABS
20.0000 mg | ORAL_TABLET | Freq: Every day | ORAL | Status: DC
  Administered 2015-06-08 – 2015-06-10 (×3): 20 mg via ORAL
  Filled 2015-06-08 (×3): qty 1

## 2015-06-08 MED ORDER — SODIUM CHLORIDE 0.9 % IV SOLN: 250.0000 mL | INTRAVENOUS | Status: DC | PRN

## 2015-06-08 MED ORDER — SODIUM CHLORIDE 0.9% FLUSH: 3.0000 mL | INTRAVENOUS | Status: DC | PRN

## 2015-06-08 MED ORDER — SENNOSIDES-DOCUSATE SODIUM 8.6-50 MG PO TABS
1.0000 | ORAL_TABLET | Freq: Every evening | ORAL | Status: DC | PRN
Start: 2015-06-08 — End: 2015-06-10

## 2015-06-08 MED ORDER — ONDANSETRON HCL 4 MG/2ML IJ SOLN
4.0000 mg | Freq: Four times a day (QID) | INTRAMUSCULAR | Status: DC | PRN
Start: 2015-06-08 — End: 2015-06-10

## 2015-06-08 MED ORDER — SODIUM CHLORIDE 0.9% FLUSH
3.0000 mL | Freq: Two times a day (BID) | INTRAVENOUS | Status: DC
  Administered 2015-06-08 – 2015-06-10 (×5): 3 mL via INTRAVENOUS

## 2015-06-08 NOTE — Progress Notes (Signed)
Dr. Lavetta Nielsen notified of 8 beats of SVT. No new orders. Patient is asymptomatic. Wilnette Kales

## 2015-06-08 NOTE — Progress Notes (Signed)
[facility] [place] NAME: Craig Wright    MRN#:  [id number]  DATE OF BIRTH:  January 28, 1966  SUBJECTIVE:  Hospital Day: 1 day Craig Wright is a 50 y.o. male presenting with Weakness and Cough .   Overnight events: No overnight events Interval Events: Breathing somewhat improved however not at baseline-worse 5 L nasal cannula Baseline so complains of cough and chest tightness  REVIEW OF SYSTEMS:  CONSTITUTIONAL: No fever, fatigue or weakness.  EYES: No blurred or double vision.  EARS, NOSE, AND THROAT: No tinnitus or ear pain.  RESPIRATORY: Positive cough, shortness of breath, wheezing denies hemoptysis.  CARDIOVASCULAR: No chest pain, orthopnea, edema.  GASTROINTESTINAL: No nausea, vomiting, diarrhea or abdominal pain.  GENITOURINARY: No dysuria, hematuria.  ENDOCRINE: No polyuria, nocturia,  HEMATOLOGY: No anemia, easy bruising or bleeding SKIN: No rash or lesion. MUSCULOSKELETAL: No joint pain or arthritis.   NEUROLOGIC: No tingling, numbness, weakness.  PSYCHIATRY: No anxiety or depression.   DRUG ALLERGIES:  No Known Allergies  VITALS:  Blood pressure 107/64, pulse 91, temperature 97.9 F (36.6 C), temperature source Oral, resp. rate 19, height 6' (1.829 m), weight 89.994 kg (198 lb 6.4 oz), SpO2 91 %.  PHYSICAL EXAMINATION:  VITAL SIGNS: Filed Vitals:   06/08/15 0951 06/08/15 1206  BP: 122/75 107/64  Pulse: 103 91  Temp:  97.9 F (36.6 C)  Resp:  35   GENERAL:49 y.o.male currently in no acute distress.  HEAD: Normocephalic, atraumatic.  EYES: Pupils equal, round, reactive to light. Extraocular muscles intact. No scleral icterus.  MOUTH: Moist mucosal membrane. Dentition intact. No abscess noted.  EAR, NOSE, THROAT: Clear without exudates. No external lesions.  NECK: Supple. No thyromegaly. No nodules. No JVD.  PULMONARY: Diminished breath sounds throughout, expiratory wheeze prominent and left lobes  otherwise no rails or rhonci. No use of accessory muscles, Good respiratory effort. good air entry bilaterally CHEST: Nontender to palpation.  CARDIOVASCULAR: S1 and S2. Regular rate and rhythm. No murmurs, rubs, or gallops. No edema. Pedal pulses 2+ bilaterally.  GASTROINTESTINAL: Soft, nontender, nondistended. No masses. Positive bowel sounds. No hepatosplenomegaly.  MUSCULOSKELETAL: No swelling, clubbing, or edema. Range of motion full in all extremities.  NEUROLOGIC: Cranial nerves II through XII are intact. No gross focal neurological deficits. Sensation intact. Reflexes intact.  SKIN: No ulceration, lesions, rashes, or cyanosis. Skin warm and dry. Turgor intact.  PSYCHIATRIC: Mood, affect within normal limits. The patient is awake, alert and oriented x 3. Insight, judgment intact.      LABORATORY PANEL:   CBC  Recent Labs Lab 06/08/15 1210  WBC 6.8  HGB 13.6  HCT 41.0  PLT 176   ------------------------------------------------------------------------------------------------------------------  Chemistries   Recent Labs Lab 06/08/15 1210  NA 134*  K 4.1  CL 90*  CO2 38*  GLUCOSE 242*  BUN 10  CREATININE 0.64  CALCIUM 8.7*   ------------------------------------------------------------------------------------------------------------------  Cardiac Enzymes No results for input(s): TROPONINI in the last 168 hours. ------------------------------------------------------------------------------------------------------------------  RADIOLOGY:  Dg Chest 2 View  06/07/2015  CLINICAL DATA:  Patient with congestion, dizziness and fatigue. EXAM: CHEST  2 VIEW COMPARISON:  Chest radiograph 07/29/2012 FINDINGS: Stable cardiac and mediastinal contours. No consolidative pulmonary opacities. No pleural effusion or pneumothorax. Regional skeleton is unremarkable. IMPRESSION: No active cardiopulmonary disease. Electronically Signed   By: Lovey Newcomer M.D.   On: 06/07/2015 16:59   Ct  Angio Chest Pe W/cm &/or Wo Cm  06/07/2015  CLINICAL DATA:  In difficulty breathing.  EXAM: CT ANGIOGRAPHY CHEST WITH CONTRAST TECHNIQUE: Multidetector CT imaging of the chest was performed using the standard protocol during bolus administration of intravenous contrast. Multiplanar CT image reconstructions and MIPs were obtained to evaluate the vascular anatomy. CONTRAST:  100 cc Isovue 370. COMPARISON:  None. FINDINGS: Mediastinum/Nodes: Respiratory motion at the lung bases is somewhat limiting for detection of segmental and subsegmental pulmonary emboli. Otherwise, no pulmonary emboli. No pathologically enlarged mediastinal, hilar or axillary lymph nodes. Esophagus is somewhat dilated air-filled, suggesting dysmotility. Heart size normal. No pericardial effusion. Lungs/Pleura: Centrilobular and paraseptal emphysema. Image quality is degraded by respiratory motion. There is spiculated thickening along the periphery of a focal bed of emphysema in the medial right lower lobe. Lesion measures 2.0 cm (series 6, image 89). Scattered peribronchovascular nodularity in the left upper lobe and to a lesser extent, left lower lobe. No pleural fluid. Airway is unremarkable. Upper abdomen: A blush of hyper attenuation along the hepatic dome (series 4, image 121) may represent a flash fill hemangioma or perfusion anomaly. Visualized portions of the liver are otherwise unremarkable. There may be tiny stones in the gallbladder. Visualized portions of the adrenal glands, left kidney, spleen, pancreas, stomach and bowel are grossly unremarkable. Musculoskeletal: No worrisome lytic or sclerotic lesions. Review of the MIP images confirms the above findings. IMPRESSION: 1. Assessment for basilar segmental and subsegmental pulmonary emboli is limited by respiratory motion. Otherwise, no pulmonary embolus. 2. Peribronchovascular nodularity in the left upper and left lower lobes, indicative of an infectious bronchiolitis. 3. Irregular  peripheral thickening along a focal bed of emphysema in the right lower lobe. While this may be infectious or inflammatory in etiology, adenocarcinoma can also have this appearance. Follow-up CT chest without contrast is recommended in 3-4 weeks to ensure resolution. 4. Question cholelithiasis. Electronically Signed   By: Lorin Picket M.D.   On: 06/07/2015 20:12    EKG:   Orders placed or performed during the hospital encounter of 06/07/15  . ED EKG  . ED EKG  . EKG 12-Lead  . EKG 12-Lead    ASSESSMENT AND PLAN:   Craig Wright is a 50 y.o. male presenting with Weakness and Cough . Admitted 06/07/2015 : Day #: 1 day 1. Chronic obstructive pulmonary disease exacerbation: Provide DuoNeb treatments q. 4 hours, decreased to Solu-Medrol 60 mg IV q. daily, Levaquin. Continue with home medications.   2. Type 2 diabetes insulin requiring continue basal and sliding scale coverage decrease steroids followed glucose closely 3. Hyponatremia: Mild follow BMP Hycodan 70% 80 H Etiology   All the records are reviewed and case discussed with Care Management/Social Workerr. Management plans discussed with the patient, family and they are in agreement.  CODE STATUS: full TOTAL TIME TAKING CARE OF THIS PATIENT: 28 minutes.   POSSIBLE D/C IN 1-2DAYS, DEPENDING ON CLINICAL CONDITION.   Leveta Wahab,  Karenann Cai.D on 06/08/2015 at 1:36 PM  Between 7am to 6pm - Pager - [phone]  After 6pm: House Pager: - [facility]  Office  [phone]  CC: Primary care physician; No primary care provider on file.

## 2015-06-08 NOTE — Progress Notes (Signed)
Pt is refusing to allow ABG draw

## 2015-06-08 NOTE — Progress Notes (Signed)
Dr. Estanislado Pandy aware of pts CBG results. No new orders received.

## 2015-06-08 NOTE — Progress Notes (Signed)
Hospitalist paged 2x to report pt's refusal of ABG. Waiting on return call

## 2015-06-08 NOTE — Progress Notes (Signed)
Pt refusing labs this morning, MD Pyreddy aware.

## 2015-06-08 NOTE — Progress Notes (Signed)
Spoke with Dr. Estanislado Pandy. Order given to d/c ABG order.

## 2015-06-09 LAB — GLUCOSE, CAPILLARY
GLUCOSE-CAPILLARY: 130 mg/dL — AB (ref 65–99)
GLUCOSE-CAPILLARY: 144 mg/dL — AB (ref 65–99)
Glucose-Capillary: 254 mg/dL — ABNORMAL HIGH (ref 65–99)
Glucose-Capillary: 272 mg/dL — ABNORMAL HIGH (ref 65–99)

## 2015-06-09 MED ORDER — LEVOFLOXACIN 750 MG PO TABS
750.0000 mg | ORAL_TABLET | Freq: Every day | ORAL | Status: DC
  Administered 2015-06-09: 750 mg via ORAL
  Filled 2015-06-09: qty 1

## 2015-06-09 NOTE — Progress Notes (Signed)
Patient alert and oriented x4, no complaints at this time. Patient NSR on telemetry. Will continue to assess. Craig Wright  

## 2015-06-09 NOTE — Progress Notes (Signed)
[facility] [place] NAME: Craig Wright    MRN#:  [id number]  DATE OF BIRTH:  08/20/1965  SUBJECTIVE:  Hospital Day: 2 days Craig Wright is a 50 y.o. male presenting with Weakness and Cough .   Overnight events: No overnight events Interval Events: Breathing somewhat improved still some weakenss  REVIEW OF SYSTEMS:  CONSTITUTIONAL: No fever, fatigue positive weakness.  EYES: No blurred or double vision.  EARS, NOSE, AND THROAT: No tinnitus or ear pain.  RESPIRATORY: Positive cough, shortness of breath, wheezing denies hemoptysis.  CARDIOVASCULAR: No chest pain, orthopnea, edema.  GASTROINTESTINAL: No nausea, vomiting, diarrhea or abdominal pain.  GENITOURINARY: No dysuria, hematuria.  ENDOCRINE: No polyuria, nocturia,  HEMATOLOGY: No anemia, easy bruising or bleeding SKIN: No rash or lesion. MUSCULOSKELETAL: No joint pain or arthritis.   NEUROLOGIC: No tingling, numbness, weakness.  PSYCHIATRY: No anxiety or depression.   DRUG ALLERGIES:  No Known Allergies  VITALS:  Blood pressure 125/57, pulse 102, temperature 97.9 F (36.6 C), temperature source Oral, resp. rate 20, height 6' (1.829 m), weight 89.994 kg (198 lb 6.4 oz), SpO2 93 %.  PHYSICAL EXAMINATION:  VITAL SIGNS: Filed Vitals:   06/09/15 0849 06/09/15 1141  BP: 106/57 125/57  Pulse: 93 102  Temp:  97.9 F (36.6 C)  Resp:  20   GENERAL:49 y.o.male currently in no acute distress.  HEAD: Normocephalic, atraumatic.  EYES: Pupils equal, round, reactive to light. Extraocular muscles intact. No scleral icterus.  MOUTH: Moist mucosal membrane. Dentition intact. No abscess noted.  EAR, NOSE, THROAT: Clear without exudates. No external lesions.  NECK: Supple. No thyromegaly. No nodules. No JVD.  PULMONARY: Diminished breath sounds throughout, expiratory wheeze prominent and left lobes otherwise no rails or rhonci. No use of accessory muscles, Good respiratory  effort. good air entry bilaterally CHEST: Nontender to palpation.  CARDIOVASCULAR: S1 and S2. Regular rate and rhythm. No murmurs, rubs, or gallops. No edema. Pedal pulses 2+ bilaterally.  GASTROINTESTINAL: Soft, nontender, nondistended. No masses. Positive bowel sounds. No hepatosplenomegaly.  MUSCULOSKELETAL: No swelling, clubbing, or edema. Range of motion full in all extremities.  NEUROLOGIC: Cranial nerves II through XII are intact. No gross focal neurological deficits. Sensation intact. Reflexes intact.  SKIN: No ulceration, lesions, rashes, or cyanosis. Skin warm and dry. Turgor intact.  PSYCHIATRIC: Mood, affect within normal limits. The patient is awake, alert and oriented x 3. Insight, judgment intact.      LABORATORY PANEL:   CBC  Recent Labs Lab 06/08/15 1210  WBC 6.8  HGB 13.6  HCT 41.0  PLT 176   ------------------------------------------------------------------------------------------------------------------  Chemistries   Recent Labs Lab 06/08/15 1210  NA 134*  K 4.1  CL 90*  CO2 38*  GLUCOSE 242*  BUN 10  CREATININE 0.64  CALCIUM 8.7*   ------------------------------------------------------------------------------------------------------------------  Cardiac Enzymes No results for input(s): TROPONINI in the last 168 hours. ------------------------------------------------------------------------------------------------------------------  RADIOLOGY:  Dg Chest 2 View  06/07/2015  CLINICAL DATA:  Patient with congestion, dizziness and fatigue. EXAM: CHEST  2 VIEW COMPARISON:  Chest radiograph 07/29/2012 FINDINGS: Stable cardiac and mediastinal contours. No consolidative pulmonary opacities. No pleural effusion or pneumothorax. Regional skeleton is unremarkable. IMPRESSION: No active cardiopulmonary disease. Electronically Signed   By: Lovey Newcomer M.D.   On: 06/07/2015 16:59   Ct Angio Chest Pe W/cm &/or Wo Cm  06/07/2015  CLINICAL DATA:  In difficulty  breathing. EXAM: CT ANGIOGRAPHY CHEST WITH CONTRAST TECHNIQUE: Multidetector CT imaging of the chest  was performed using the standard protocol during bolus administration of intravenous contrast. Multiplanar CT image reconstructions and MIPs were obtained to evaluate the vascular anatomy. CONTRAST:  100 cc Isovue 370. COMPARISON:  None. FINDINGS: Mediastinum/Nodes: Respiratory motion at the lung bases is somewhat limiting for detection of segmental and subsegmental pulmonary emboli. Otherwise, no pulmonary emboli. No pathologically enlarged mediastinal, hilar or axillary lymph nodes. Esophagus is somewhat dilated air-filled, suggesting dysmotility. Heart size normal. No pericardial effusion. Lungs/Pleura: Centrilobular and paraseptal emphysema. Image quality is degraded by respiratory motion. There is spiculated thickening along the periphery of a focal bed of emphysema in the medial right lower lobe. Lesion measures 2.0 cm (series 6, image 89). Scattered peribronchovascular nodularity in the left upper lobe and to a lesser extent, left lower lobe. No pleural fluid. Airway is unremarkable. Upper abdomen: A blush of hyper attenuation along the hepatic dome (series 4, image 121) may represent a flash fill hemangioma or perfusion anomaly. Visualized portions of the liver are otherwise unremarkable. There may be tiny stones in the gallbladder. Visualized portions of the adrenal glands, left kidney, spleen, pancreas, stomach and bowel are grossly unremarkable. Musculoskeletal: No worrisome lytic or sclerotic lesions. Review of the MIP images confirms the above findings. IMPRESSION: 1. Assessment for basilar segmental and subsegmental pulmonary emboli is limited by respiratory motion. Otherwise, no pulmonary embolus. 2. Peribronchovascular nodularity in the left upper and left lower lobes, indicative of an infectious bronchiolitis. 3. Irregular peripheral thickening along a focal bed of emphysema in the right lower lobe.  While this may be infectious or inflammatory in etiology, adenocarcinoma can also have this appearance. Follow-up CT chest without contrast is recommended in 3-4 weeks to ensure resolution. 4. Question cholelithiasis. Electronically Signed   By: Lorin Picket M.D.   On: 06/07/2015 20:12    EKG:   Orders placed or performed during the hospital encounter of 06/07/15  . ED EKG  . ED EKG  . EKG 12-Lead  . EKG 12-Lead    ASSESSMENT AND PLAN:   Craig Wright is a 50 y.o. male presenting with Weakness and Cough . Admitted 06/07/2015 : Day #: 2 days 1. Chronic obstructive pulmonary disease exacerbation: Provide DuoNeb treatments q. 4 hours, decreased to Solu-Medrol 60 mg IV q. daily, Levaquin. Continue with home medications.   2. Type 2 diabetes insulin requiring continue basal and sliding scale coverage decrease steroids followed glucose closely 3. Hyponatremia: Mild follow BMP   All the records are reviewed and case discussed with Care Management/Social Workerr. Management plans discussed with the patient, family and they are in agreement.  CODE STATUS: full TOTAL TIME TAKING CARE OF THIS PATIENT: 28 minutes.   POSSIBLE D/C IN 1-2DAYS, DEPENDING ON CLINICAL CONDITION.   Hower,  Karenann Cai.D on 06/09/2015 at 12:30 PM  Between 7am to 6pm - Pager - [phone]  After 6pm: House Pager: - [facility]  Office  [phone]  CC: Primary care physician; No primary care provider on file.

## 2015-06-09 NOTE — Progress Notes (Signed)
PHARMACIST - PHYSICIAN COMMUNICATION  CONCERNING: Antibiotic IV to Oral Route Change Policy  RECOMMENDATION: This patient is receiving levofloxacin by the intravenous route.  Based on criteria approved by the Pharmacy and Therapeutics Committee, the antibiotic(s) is/are being converted to the equivalent oral dose form(s).   DESCRIPTION: These criteria include:  Patient being treated for a respiratory tract infection, urinary tract infection, cellulitis or clostridium difficile associated diarrhea if on metronidazole  The patient is not neutropenic and does not exhibit a GI malabsorption state  The patient is eating (either orally or via tube) and/or has been taking other orally administered medications for a least 24 hours  The patient is improving clinically and has a Tmax < 100.5

## 2015-06-10 LAB — GLUCOSE, CAPILLARY
GLUCOSE-CAPILLARY: 106 mg/dL — AB (ref 65–99)
Glucose-Capillary: 187 mg/dL — ABNORMAL HIGH (ref 65–99)

## 2015-06-10 MED ORDER — PREDNISONE 10 MG (21) PO TBPK
10.0000 mg | ORAL_TABLET | Freq: Every day | ORAL | Status: DC
Start: 2015-06-10 — End: 2015-06-26

## 2015-06-10 MED ORDER — LEVOFLOXACIN 750 MG PO TABS: 750.0000 mg | ORAL_TABLET | Freq: Every day | ORAL | Status: DC

## 2015-06-10 NOTE — Discharge Summary (Signed)
[facility] [place] NAME: Craig Wright    MR#:  [id number]  DATE OF BIRTH:  Aug 09, 1965  DATE OF ADMISSION:  06/07/2015 ADMITTING PHYSICIAN: Saundra Shelling, MD  DATE OF DISCHARGE: 06/10/2015  PRIMARY CARE PHYSICIAN: No primary care provider on file.    ADMISSION DIAGNOSIS:  Community acquired pneumonia [J18.9] COPD with exacerbation ([place]) [J44.1] Acute respiratory failure with hypoxia and hypercapnia (HCC) [J96.01, J96.02]  DISCHARGE DIAGNOSIS:  COPD exacerbation Community-acquired pneumonia ruled out SECONDARY DIAGNOSIS:   Past Medical History  Diagnosis Date  . Diabetes mellitus without complication ([place])   . Asthma   . Hypertension   . COPD (chronic obstructive pulmonary disease) ([place])   . Anxiety disorder     HOSPITAL COURSE:  Craig Wright  is a 50 y.o. male admitted 06/07/2015 with chief complaint Weakness and Cough . Please see H&P performed by Saundra Shelling, MD for further information. Patient presented with the above symptoms and findings consistent with COPD exacerbation. He is on baseline 5 L nasal cannula oxygen. His mood was started on Levaquin for bacterial cause of exacerbation as well as steroids. He has made slow but steady improvement throughout the course of the hospitalization is now feeling back to baseline. Improved and stable for discharge  DISCHARGE CONDITIONS:   Stable  CONSULTS OBTAINED:     DRUG ALLERGIES:  No Known Allergies  DISCHARGE MEDICATIONS:   Current Discharge Medication List    START taking these medications   Details  levofloxacin (LEVAQUIN) 750 MG tablet Take 1 tablet (750 mg total) by mouth daily at 6 PM. Qty: 3 tablet, Refills: 0    predniSONE (STERAPRED UNI-PAK 21 TAB) 10 MG (21) TBPK tablet Take 1 tablet (10 mg total) by mouth daily. '40mg'$  oral 1 day, then '20mg'$  oral for 2 days, then '10mg'$  oral 2 days, then stop Qty: 10 tablet, Refills: 0      CONTINUE these medications  which have NOT CHANGED   Details  clonazePAM (KLONOPIN) 1 MG tablet Take 1 mg by mouth 2 (two) times daily.    DULoxetine (CYMBALTA) 60 MG capsule Take 120 mg by mouth daily.    furosemide (LASIX) 20 MG tablet Take 20 mg by mouth daily.    metFORMIN (GLUMETZA) 500 MG (MOD) 24 hr tablet Take 500 mg by mouth daily with breakfast.    oxycodone (OXY-IR) 5 MG capsule Take 5 mg by mouth every 4 (four) hours as needed.    oxyCODONE (OXYCONTIN) 20 mg 12 hr tablet Take 20 mg by mouth every 12 (twelve) hours.         DISCHARGE INSTRUCTIONS:    DIET:  Diabetic diet  DISCHARGE CONDITION:  Stable  ACTIVITY:  Activity as tolerated  OXYGEN:  Home Oxygen: Yes.     Oxygen Delivery: 5 liters/min via Patient connected to nasal cannula oxygen  DISCHARGE LOCATION:  home   If you experience worsening of your admission symptoms, develop shortness of breath, life threatening emergency, suicidal or homicidal thoughts you must seek medical attention immediately by calling 911 or calling your MD immediately  if symptoms less severe.  You Must read complete instructions/literature along with all the possible adverse reactions/side effects for all the Medicines you take and that have been prescribed to you. Take any new Medicines after you have completely understood and accpet all the possible adverse reactions/side effects.   Please note  You were cared for by a hospitalist during your hospital stay. If you have  any questions about your discharge medications or the care you received while you were in the hospital after you are discharged, you can call the unit and asked to speak with the hospitalist on call if the hospitalist that took care of you is not available. Once you are discharged, your primary care physician will handle any further medical issues. Please note that NO REFILLS for any discharge medications will be authorized once you are discharged, as it is imperative that you return to your  primary care physician (or establish a relationship with a primary care physician if you do not have one) for your aftercare needs so that they can reassess your need for medications and monitor your lab values.    On the day of Discharge:   VITAL SIGNS:  Blood pressure 118/64, pulse 72, temperature 98.2 F (36.8 C), temperature source Oral, resp. rate 16, height 6' (1.829 m), weight 89.994 kg (198 lb 6.4 oz), SpO2 96 %.  I/O:   Intake/Output Summary (Last 24 hours) at 06/10/15 1142 Last data filed at 06/10/15 1058  Gross per 24 hour  Intake   1320 ml  Output   1605 ml  Net   -285 ml    PHYSICAL EXAMINATION:  GENERAL:  50 y.o.-year-old patient lying in the bed with no acute distress.  EYES: Pupils equal, round, reactive to light and accommodation. No scleral icterus. Extraocular muscles intact.  HEENT: Head atraumatic, normocephalic. Oropharynx and nasopharynx clear.  NECK:  Supple, no jugular venous distention. No thyroid enlargement, no tenderness.  LUNGS: Normal breath sounds bilaterally, no wheezing, rales,rhonchi or crepitation. No use of accessory muscles of respiration.  CARDIOVASCULAR: S1, S2 normal. No murmurs, rubs, or gallops.  ABDOMEN: Soft, non-tender, non-distended. Bowel sounds present. No organomegaly or mass.  EXTREMITIES: No pedal edema, cyanosis, or clubbing.  NEUROLOGIC: Cranial nerves II through XII are intact. Muscle strength 5/5 in all extremities. Sensation intact. Gait not checked.  PSYCHIATRIC: The patient is alert and oriented x 3.  SKIN: No obvious rash, lesion, or ulcer.   DATA REVIEW:   CBC  Recent Labs Lab 06/08/15 1210  WBC 6.8  HGB 13.6  HCT 41.0  PLT 176    Chemistries   Recent Labs Lab 06/08/15 1210  NA 134*  K 4.1  CL 90*  CO2 38*  GLUCOSE 242*  BUN 10  CREATININE 0.64  CALCIUM 8.7*    Cardiac Enzymes No results for input(s): TROPONINI in the last 168 hours.  Microbiology Results  Results for orders placed or  performed during the hospital encounter of 06/07/15  MRSA PCR Screening     Status: None   Collection Time: 06/08/15 12:25 AM  Result Value Ref Range Status   MRSA by PCR NEGATIVE NEGATIVE Final    Comment:        The GeneXpert MRSA Assay (FDA approved for NASAL specimens only), is one component of a comprehensive MRSA colonization surveillance program. It is not intended to diagnose MRSA infection nor to guide or monitor treatment for MRSA infections.     RADIOLOGY:  No results found.   Management plans discussed with the patient, family and they are in agreement.  CODE STATUS:     Code Status Orders        Start     Ordered   06/08/15 0015  Full code   Continuous     06/08/15 0014    Code Status History    Date Active Date Inactive Code Status Order ID Comments  User Context   This patient has a current code status but no historical code status.      TOTAL TIME TAKING CARE OF THIS PATIENT: 28 minutes.    Hower,  Karenann Cai.D on 06/10/2015 at 11:42 AM  Between 7am to 6pm - Pager - [phone]  After 6pm go to [email or web address] - [employer] [facility]  Office  [phone]  CC: Primary care physician; No primary care provider on file.

## 2015-06-10 NOTE — Progress Notes (Signed)
Patient being discharged home back to assisted living , discharged instruction provided, iv removed patient waiting for his ride , will applied patient own oxygen tank prior to discharge

## 2015-06-10 NOTE — Care Management Note (Addendum)
Case Management Note  Patient Details  Name: Craig Wright MRN: [id number] Date of Birth: 03/25/1965  Subjective/Objective:        Call to [facility] per Mr Schermer's empty portable oxygen tank which he needs for transport back to [employer]. Mr Hershey is on chronic 5L N/[place].            Action/Plan:   Expected Discharge Date:                  Expected Discharge Plan:     In-House Referral:     Discharge planning Services     Post Acute Care Choice:    Choice offered to:     DME Arranged:    DME Agency:     HH Arranged:    [place] Agency:     Status of Service:     Medicare Important Message Given:    Date Medicare IM Given:    Medicare IM give by:    Date Additional Medicare IM Given:    Additional Medicare Important Message give by:     If discussed at [place] of Stay Meetings, dates discussed:    Additional Comments:  Lenda Baratta A, RN 06/10/2015, 12:06 PM

## 2015-06-10 NOTE — Progress Notes (Signed)
Bedside report received at this time. Patient remains alert and oriented, resting in bed at this time, vss, tele in place SR, no acute distress noted

## 2015-06-26 ENCOUNTER — Emergency Department: Payer: Medicaid Other

## 2015-06-26 ENCOUNTER — Inpatient Hospital Stay
Admission: EM | Admit: 2015-06-26 | Discharge: 2015-06-28 | DRG: 190 | Disposition: A | Discharge status: Home Health | Payer: Medicaid Other | Attending: Internal Medicine | Admitting: Internal Medicine

## 2015-06-26 DIAGNOSIS — J45909 Unspecified asthma, uncomplicated: Secondary | ICD-10-CM | POA: Diagnosis present

## 2015-06-26 DIAGNOSIS — F419 Anxiety disorder, unspecified: Secondary | ICD-10-CM | POA: Diagnosis present

## 2015-06-26 DIAGNOSIS — F17210 Nicotine dependence, cigarettes, uncomplicated: Secondary | ICD-10-CM | POA: Diagnosis present

## 2015-06-26 DIAGNOSIS — F1120 Opioid dependence, uncomplicated: Secondary | ICD-10-CM | POA: Diagnosis present

## 2015-06-26 DIAGNOSIS — Z79891 Long term (current) use of opiate analgesic: Secondary | ICD-10-CM | POA: Diagnosis not present

## 2015-06-26 DIAGNOSIS — I110 Hypertensive heart disease with heart failure: Secondary | ICD-10-CM | POA: Diagnosis present

## 2015-06-26 DIAGNOSIS — Z66 Do not resuscitate: Secondary | ICD-10-CM | POA: Diagnosis present

## 2015-06-26 DIAGNOSIS — J441 Chronic obstructive pulmonary disease with (acute) exacerbation: Principal | ICD-10-CM | POA: Diagnosis present

## 2015-06-26 DIAGNOSIS — J9622 Acute and chronic respiratory failure with hypercapnia: Secondary | ICD-10-CM | POA: Diagnosis present

## 2015-06-26 DIAGNOSIS — Z79899 Other long term (current) drug therapy: Secondary | ICD-10-CM

## 2015-06-26 DIAGNOSIS — J9601 Acute respiratory failure with hypoxia: Secondary | ICD-10-CM

## 2015-06-26 DIAGNOSIS — Z7984 Long term (current) use of oral hypoglycemic drugs: Secondary | ICD-10-CM | POA: Diagnosis not present

## 2015-06-26 DIAGNOSIS — E119 Type 2 diabetes mellitus without complications: Secondary | ICD-10-CM | POA: Diagnosis present

## 2015-06-26 DIAGNOSIS — Z7951 Long term (current) use of inhaled steroids: Secondary | ICD-10-CM | POA: Diagnosis not present

## 2015-06-26 DIAGNOSIS — J9602 Acute respiratory failure with hypercapnia: Secondary | ICD-10-CM | POA: Diagnosis not present

## 2015-06-26 DIAGNOSIS — I509 Heart failure, unspecified: Secondary | ICD-10-CM | POA: Diagnosis present

## 2015-06-26 DIAGNOSIS — J9621 Acute and chronic respiratory failure with hypoxia: Secondary | ICD-10-CM | POA: Diagnosis present

## 2015-06-26 DIAGNOSIS — Z9981 Dependence on supplemental oxygen: Secondary | ICD-10-CM

## 2015-06-26 LAB — CBC
HCT: 44.1 % (ref 40.0–52.0)
HEMATOCRIT: 39.2 % — AB (ref 40.0–52.0)
HEMOGLOBIN: 14.6 g/dL (ref 13.0–18.0)
HEMOGLOBIN: 12.9 g/dL — AB (ref 13.0–18.0)
MCH: 29.6 pg (ref 26.0–34.0)
MCH: 30.1 pg (ref 26.0–34.0)
MCHC: 33.2 g/dL (ref 32.0–36.0)
MCHC: 33 g/dL (ref 32.0–36.0)
MCV: 89 fL (ref 80.0–100.0)
MCV: 91.3 fL (ref 80.0–100.0)
Platelets: 103 10*3/uL — ABNORMAL LOW (ref 150–440)
Platelets: 105 10*3/uL — ABNORMAL LOW (ref 150–440)
RBC: 4.95 MIL/uL (ref 4.40–5.90)
RBC: 4.29 MIL/uL — AB (ref 4.40–5.90)
RDW: 13.8 % (ref 11.5–14.5)
RDW: 14.1 % (ref 11.5–14.5)
WBC: 11.8 10*3/uL — ABNORMAL HIGH (ref 3.8–10.6)
WBC: 10.9 10*3/uL — AB (ref 3.8–10.6)

## 2015-06-26 LAB — TROPONIN I

## 2015-06-26 LAB — BASIC METABOLIC PANEL
ANION GAP: 6 (ref 5–15)
BUN: 7 mg/dL (ref 6–20)
CHLORIDE: 94 mmol/L — AB (ref 101–111)
CO2: 38 mmol/L — AB (ref 22–32)
Calcium: 8.2 mg/dL — ABNORMAL LOW (ref 8.9–10.3)
Creatinine, Ser: 0.59 mg/dL — ABNORMAL LOW (ref 0.61–1.24)
GFR calc non Af Amer: >60 mL/min (ref >60)
GLUCOSE: 314 mg/dL — AB (ref 65–99)
POTASSIUM: 4 mmol/L (ref 3.5–5.1)
Sodium: 138 mmol/L (ref 135–145)

## 2015-06-26 LAB — GLUCOSE, CAPILLARY
GLUCOSE-CAPILLARY: 285 mg/dL — AB (ref 65–99)
GLUCOSE-CAPILLARY: 150 mg/dL — AB (ref 65–99)
Glucose-Capillary: 302 mg/dL — ABNORMAL HIGH (ref 65–99)
Glucose-Capillary: 244 mg/dL — ABNORMAL HIGH (ref 65–99)
Glucose-Capillary: 130 mg/dL — ABNORMAL HIGH (ref 65–99)

## 2015-06-26 LAB — COMPREHENSIVE METABOLIC PANEL
ALK PHOS: 44 U/L (ref 38–126)
ALT: 23 U/L (ref 17–63)
ANION GAP: 9 (ref 5–15)
AST: 20 U/L (ref 15–41)
Albumin: 4.1 g/dL (ref 3.5–5.0)
BILIRUBIN TOTAL: 0.7 mg/dL (ref 0.3–1.2)
BUN: 7 mg/dL (ref 6–20)
CALCIUM: 9.5 mg/dL (ref 8.9–10.3)
CO2: 40 mmol/L — ABNORMAL HIGH (ref 22–32)
CREATININE: 0.58 mg/dL — AB (ref 0.61–1.24)
Chloride: 93 mmol/L — ABNORMAL LOW (ref 101–111)
Glucose, Bld: 205 mg/dL — ABNORMAL HIGH (ref 65–99)
Potassium: 3.6 mmol/L (ref 3.5–5.1)
SODIUM: 142 mmol/L (ref 135–145)
Total Protein: 6.9 g/dL (ref 6.5–8.1)

## 2015-06-26 LAB — BLOOD GAS, VENOUS
ACID-BASE EXCESS: 14.7 mmol/L — AB (ref 0.0–3.0)
BICARBONATE: 47 meq/L — AB (ref 21.0–28.0)
Delivery systems: POSITIVE
FIO2: 0.4
O2 SAT: 54.8 %
Patient temperature: 37
pCO2, Ven: 100 mmHg (ref 44.0–60.0)
pH, Ven: 7.28 — ABNORMAL LOW (ref 7.320–7.430)
pO2, Ven: 33 mmHg (ref 31.0–45.0)

## 2015-06-26 LAB — BRAIN NATRIURETIC PEPTIDE: B NATRIURETIC PEPTIDE 5: 21 pg/mL (ref 0.0–100.0)

## 2015-06-26 LAB — LACTIC ACID, PLASMA
LACTIC ACID, VENOUS: 1 mmol/L (ref 0.5–2.0)
Lactic Acid, Venous: 0.6 mmol/L (ref 0.5–2.0)

## 2015-06-26 LAB — MRSA PCR SCREENING: MRSA by PCR: NEGATIVE

## 2015-06-26 MED ORDER — BUDESONIDE 0.5 MG/2ML IN SUSP
0.5000 mg | Freq: Two times a day (BID) | RESPIRATORY_TRACT | Status: DC
  Administered 2015-06-26 – 2015-06-28 (×5): 0.5 mg via RESPIRATORY_TRACT
  Filled 2015-06-26 (×5): qty 2

## 2015-06-26 MED ORDER — DEXTROSE 5 % IV SOLN
1.0000 g | Freq: Once | INTRAVENOUS | Status: AC
  Administered 2015-06-26: 1 g via INTRAVENOUS
  Filled 2015-06-26: qty 10

## 2015-06-26 MED ORDER — RISPERIDONE 1 MG PO TABS
2.0000 mg | ORAL_TABLET | Freq: Every day | ORAL | Status: DC
  Administered 2015-06-26 – 2015-06-27 (×2): 2 mg via ORAL
  Filled 2015-06-26 (×3): qty 1

## 2015-06-26 MED ORDER — METHYLPREDNISOLONE SODIUM SUCC 125 MG IJ SOLR
125.0000 mg | Freq: Once | INTRAMUSCULAR | Status: AC
  Administered 2015-06-26: 125 mg via INTRAVENOUS
  Filled 2015-06-26: qty 2

## 2015-06-26 MED ORDER — IPRATROPIUM-ALBUTEROL 0.5-2.5 (3) MG/3ML IN SOLN
3.0000 mL | Freq: Four times a day (QID) | RESPIRATORY_TRACT | Status: DC
  Administered 2015-06-26: 3 mL via RESPIRATORY_TRACT
  Filled 2015-06-26: qty 3

## 2015-06-26 MED ORDER — SODIUM CHLORIDE 0.9 % IV BOLUS (SEPSIS)
1000.0000 mL | Freq: Once | INTRAVENOUS | Status: AC
  Administered 2015-06-26: 1000 mL via INTRAVENOUS
  Filled 2015-06-26: qty 1000

## 2015-06-26 MED ORDER — KETOROLAC TROMETHAMINE 30 MG/ML IJ SOLN
30.0000 mg | Freq: Once | INTRAMUSCULAR | Status: AC
  Administered 2015-06-26: 30 mg via INTRAVENOUS
  Filled 2015-06-26: qty 1

## 2015-06-26 MED ORDER — CLONAZEPAM 0.5 MG PO TABS
1.0000 mg | ORAL_TABLET | Freq: Two times a day (BID) | ORAL | Status: DC
  Administered 2015-06-26 – 2015-06-28 (×4): 1 mg via ORAL
  Filled 2015-06-26: qty 2
  Filled 2015-06-26 (×2): qty 1
  Filled 2015-06-26 (×2): qty 2

## 2015-06-26 MED ORDER — SODIUM CHLORIDE 0.9 % IV BOLUS (SEPSIS)
1000.0000 mL | Freq: Once | INTRAVENOUS | Status: AC
  Administered 2015-06-26: 1000 mL via INTRAVENOUS

## 2015-06-26 MED ORDER — ACETAMINOPHEN 325 MG PO TABS: 650.0000 mg | ORAL_TABLET | Freq: Four times a day (QID) | ORAL | Status: DC | PRN

## 2015-06-26 MED ORDER — SODIUM CHLORIDE 0.9% FLUSH
3.0000 mL | Freq: Two times a day (BID) | INTRAVENOUS | Status: DC
  Administered 2015-06-26 – 2015-06-28 (×4): 3 mL via INTRAVENOUS

## 2015-06-26 MED ORDER — DULOXETINE HCL 30 MG PO CPEP
60.0000 mg | ORAL_CAPSULE | Freq: Two times a day (BID) | ORAL | Status: DC
  Administered 2015-06-26 – 2015-06-28 (×5): 60 mg via ORAL
  Filled 2015-06-26 (×5): qty 2

## 2015-06-26 MED ORDER — ENOXAPARIN SODIUM 40 MG/0.4ML ~~LOC~~ SOLN
40.0000 mg | SUBCUTANEOUS | Status: DC
  Filled 2015-06-26 (×2): qty 0.4

## 2015-06-26 MED ORDER — OXYCODONE HCL ER 10 MG PO T12A: 20.0000 mg | EXTENDED_RELEASE_TABLET | Freq: Two times a day (BID) | ORAL | Status: DC

## 2015-06-26 MED ORDER — METHYLPREDNISOLONE SODIUM SUCC 40 MG IJ SOLR
40.0000 mg | Freq: Every day | INTRAMUSCULAR | Status: DC
  Administered 2015-06-27 – 2015-06-28 (×2): 40 mg via INTRAVENOUS
  Filled 2015-06-26 (×2): qty 1

## 2015-06-26 MED ORDER — INSULIN ASPART 100 UNIT/ML ~~LOC~~ SOLN
0.0000 [IU] | Freq: Every day | SUBCUTANEOUS | Status: DC
Start: 2015-06-26 — End: 2015-06-28

## 2015-06-26 MED ORDER — ACETAMINOPHEN 650 MG RE SUPP: 650.0000 mg | Freq: Four times a day (QID) | RECTAL | Status: DC | PRN

## 2015-06-26 MED ORDER — OXYCODONE HCL 5 MG PO TABS
10.0000 mg | ORAL_TABLET | ORAL | Status: DC | PRN
  Administered 2015-06-26 – 2015-06-28 (×4): 10 mg via ORAL
  Filled 2015-06-26 (×5): qty 2

## 2015-06-26 MED ORDER — ALBUTEROL SULFATE (2.5 MG/3ML) 0.083% IN NEBU: 2.5000 mg | INHALATION_SOLUTION | RESPIRATORY_TRACT | Status: DC | PRN

## 2015-06-26 MED ORDER — METHYLPREDNISOLONE SODIUM SUCC 125 MG IJ SOLR: 125.0000 mg | Freq: Four times a day (QID) | INTRAMUSCULAR | Status: DC

## 2015-06-26 MED ORDER — METFORMIN HCL ER 500 MG PO TB24
500.0000 mg | ORAL_TABLET | Freq: Every day | ORAL | Status: DC
  Administered 2015-06-26 – 2015-06-28 (×3): 500 mg via ORAL
  Filled 2015-06-26 (×4): qty 1

## 2015-06-26 MED ORDER — IPRATROPIUM-ALBUTEROL 0.5-2.5 (3) MG/3ML IN SOLN
3.0000 mL | Freq: Once | RESPIRATORY_TRACT | Status: AC
  Administered 2015-06-26: 3 mL via RESPIRATORY_TRACT
  Filled 2015-06-26: qty 3

## 2015-06-26 MED ORDER — MAGNESIUM SULFATE 2 GM/50ML IV SOLN
2.0000 g | Freq: Once | INTRAVENOUS | Status: AC
  Administered 2015-06-26: 2 g via INTRAVENOUS
  Filled 2015-06-26: qty 50

## 2015-06-26 MED ORDER — DEXTROSE 5 % IV SOLN
1.0000 g | INTRAVENOUS | Status: DC
  Administered 2015-06-27: 1 g via INTRAVENOUS
  Filled 2015-06-26 (×2): qty 10

## 2015-06-26 MED ORDER — DOCUSATE SODIUM 100 MG PO CAPS
100.0000 mg | ORAL_CAPSULE | Freq: Two times a day (BID) | ORAL | Status: DC
  Administered 2015-06-26 – 2015-06-28 (×5): 100 mg via ORAL
  Filled 2015-06-26 (×5): qty 1

## 2015-06-26 MED ORDER — OXYCODONE HCL ER 20 MG PO T12A
20.0000 mg | EXTENDED_RELEASE_TABLET | Freq: Two times a day (BID) | ORAL | Status: DC
  Administered 2015-06-26 – 2015-06-28 (×4): 20 mg via ORAL
  Filled 2015-06-26: qty 1
  Filled 2015-06-26: qty 2
  Filled 2015-06-26 (×3): qty 1

## 2015-06-26 MED ORDER — IPRATROPIUM-ALBUTEROL 0.5-2.5 (3) MG/3ML IN SOLN
3.0000 mL | RESPIRATORY_TRACT | Status: DC
  Administered 2015-06-26 – 2015-06-28 (×13): 3 mL via RESPIRATORY_TRACT
  Filled 2015-06-26 (×13): qty 3

## 2015-06-26 MED ORDER — VANCOMYCIN HCL IN DEXTROSE 1-5 GM/200ML-% IV SOLN
1000.0000 mg | Freq: Once | INTRAVENOUS | Status: AC
  Administered 2015-06-26: 1000 mg via INTRAVENOUS
  Filled 2015-06-26: qty 200

## 2015-06-26 MED ORDER — ROFLUMILAST 500 MCG PO TABS
500.0000 ug | ORAL_TABLET | Freq: Every day | ORAL | Status: DC
  Administered 2015-06-26 – 2015-06-28 (×3): 500 ug via ORAL
  Filled 2015-06-26 (×3): qty 1

## 2015-06-26 MED ORDER — ONDANSETRON HCL 4 MG PO TABS: 4.0000 mg | ORAL_TABLET | Freq: Four times a day (QID) | ORAL | Status: DC | PRN

## 2015-06-26 MED ORDER — DEXTROSE 5 % IV SOLN
500.0000 mg | INTRAVENOUS | Status: DC
  Administered 2015-06-26 – 2015-06-28 (×3): 500 mg via INTRAVENOUS
  Filled 2015-06-26 (×3): qty 500

## 2015-06-26 MED ORDER — ONDANSETRON HCL 4 MG/2ML IJ SOLN: 4.0000 mg | Freq: Four times a day (QID) | INTRAMUSCULAR | Status: DC | PRN

## 2015-06-26 MED ORDER — SODIUM CHLORIDE 0.9 % IV SOLN
INTRAVENOUS | Status: DC
  Administered 2015-06-26: 06:00:00 via INTRAVENOUS

## 2015-06-26 MED ORDER — INSULIN ASPART 100 UNIT/ML ~~LOC~~ SOLN
0.0000 [IU] | Freq: Three times a day (TID) | SUBCUTANEOUS | Status: DC
  Administered 2015-06-26: 5 [IU] via SUBCUTANEOUS
  Administered 2015-06-26 – 2015-06-27 (×3): 2 [IU] via SUBCUTANEOUS
  Administered 2015-06-27 – 2015-06-28 (×2): 3 [IU] via SUBCUTANEOUS
  Administered 2015-06-28: 2 [IU] via SUBCUTANEOUS
  Filled 2015-06-26: qty 3
  Filled 2015-06-26: qty 5
  Filled 2015-06-26 (×3): qty 2
  Filled 2015-06-26: qty 3
  Filled 2015-06-26: qty 2

## 2015-06-26 NOTE — ED Provider Notes (Signed)
[facility] Emergency Department Provider Note   ____________________________________________  Time seen: Approximately 220 AM  I have reviewed the triage vital signs and the nursing notes.   HISTORY  Chief Complaint Respiratory Distress  History Limited by patient's respiratory distress.  HPI Craig Wright is a 50 y.o. male who comes into the hospital today with some shortness of breath. According to EMS he had initial sats of 77% on 5 L. The patient started feeling short of breath around 6:30 yesterday. He was taking albuterol at home and took about 4 during the day. He reports that he has no chest pain but feels tight all over his chest. According to EMS the patient a fever of 102.2. They gave him 2 DuoNeb's. The patient does have a history of COPD but also has a history of CHF. He wears 5 L of oxygen daily. The patient although he's had 2 DuoNeb's is still feeling very short of breath. He is here for evaluation.Per EMS after his initial neb the patient coughed up a large amount of phlegm.   Past Medical History  Diagnosis Date  . Diabetes mellitus without complication ([place])   . Asthma   . Hypertension   . COPD (chronic obstructive pulmonary disease) ([place])   . Anxiety disorder     Patient Active Problem List   Diagnosis Date Noted  . Acute respiratory failure with hypercapnia ([place]) 06/26/2015  . COPD with acute exacerbation ([place]) 06/26/2015  . Dyspnea 06/07/2015    Past Surgical History  Procedure Laterality Date  . Orthopedic surgery      multiple    No current outpatient prescriptions on file.  Allergies Review of patient's allergies indicates no known allergies.  Family History  Problem Relation Age of Onset  . Cervical cancer Mother   . Colon cancer Father     Social History Social History  Substance Use Topics  . Smoking status: Current Some Day Smoker  . Smokeless tobacco: None  . Alcohol Use: 0.0 oz/week    0  Standard drinks or equivalent per week    Review of Systems Constitutional: No fever/chills Eyes: No visual changes. ENT: No sore throat. Cardiovascular: Chest tightness . Respiratory: shortness of breath. Gastrointestinal: No abdominal pain.  No nausea, no vomiting.  No diarrhea.  No constipation. Genitourinary: Negative for dysuria. Musculoskeletal: Negative for back pain. Skin: Negative for rash. Neurological: Negative for headaches, focal weakness or numbness.  10-point ROS otherwise negative.  ____________________________________________   PHYSICAL EXAM:  VITAL SIGNS: ED Triage Vitals  Enc Vitals Group     BP 06/26/15 0230 187/96 mmHg     Pulse Rate 06/26/15 0230 150     Resp 06/26/15 0230 24     Temp 06/26/15 0311 102.2 F (39 C)     Temp Source 06/26/15 0311 Axillary     SpO2 06/26/15 0230 99 %     Weight --      Height --      Head Cir --      Peak Flow --      Pain Score --      Pain Loc --      Pain Edu? --      Excl. in [place]? --     Constitutional: Alert and oriented. Well appearing and in via respiratory distress. Eyes: Conjunctivae are normal. PERRL. EOMI. Head: Atraumatic. Nose: No congestion/rhinnorhea. Mouth/Throat: Mucous membranes are moist.  Oropharynx non-erythematous. Cardiovascular: Tachycardia, regular rhythm. Grossly normal heart sounds.  Good  peripheral circulation. Respiratory: Increased respiratory effort with subcostal retractions. Tight wheezes and crackles throughout all lung fields Gastrointestinal: Soft and nontender. No distention. Positive bowel sounds Musculoskeletal: No lower extremity tenderness nor edema.   Neurologic:  Normal speech and language. No gross focal neurologic deficits are appreciated. Skin:  Skin is warm, dry and intact.  Psychiatric: Mood and affect are normal.   ____________________________________________   LABS (all labs ordered are listed, but only abnormal results are displayed)  Labs Reviewed  CBC -  Abnormal; Notable for the following:    WBC 11.8 (*)    Platelets 103 (*)    All other components within normal limits  COMPREHENSIVE METABOLIC PANEL - Abnormal; Notable for the following:    Chloride 93 (*)    CO2 40 (*)    Glucose, Bld 205 (*)    Creatinine, Ser 0.58 (*)    All other components within normal limits  BLOOD GAS, VENOUS - Abnormal; Notable for the following:    pH, Ven 7.28 (*)    pCO2, Ven 100 (*)    Bicarbonate 47.0 (*)    Acid-Base Excess 14.7 (*)    All other components within normal limits  GLUCOSE, CAPILLARY - Abnormal; Notable for the following:    Glucose-Capillary 285 (*)    All other components within normal limits  CBC - Abnormal; Notable for the following:    WBC 10.9 (*)    RBC 4.29 (*)    Hemoglobin 12.9 (*)    HCT 39.2 (*)    Platelets 105 (*)    All other components within normal limits  BASIC METABOLIC PANEL - Abnormal; Notable for the following:    Chloride 94 (*)    CO2 38 (*)    Glucose, Bld 314 (*)    Creatinine, Ser 0.59 (*)    Calcium 8.2 (*)    All other components within normal limits  GLUCOSE, CAPILLARY - Abnormal; Notable for the following:    Glucose-Capillary 302 (*)    All other components within normal limits  CULTURE, BLOOD (ROUTINE X 2)  CULTURE, BLOOD (ROUTINE X 2)  MRSA PCR SCREENING  TROPONIN I  BRAIN NATRIURETIC PEPTIDE  LACTIC ACID, PLASMA  LACTIC ACID, PLASMA   ____________________________________________  EKG  ED ECG REPORT I, Loney Hering, the attending physician, personally viewed and interpreted this ECG.   Date: 06/26/2015  EKG Time: 235  Rate: 150  Rhythm: sinus tachycardia  Axis: Right axis deviation  Intervals:none  ST&T Change: Normal  ____________________________________________  RADIOLOGY  Chest x-ray: Chronic-appearing interstitial coarsening, no acute cardiopulmonary findings. ____________________________________________   PROCEDURES  Procedure(s) performed: None  Critical  Care performed: Yes, see critical care note(s)  CRITICAL CARE Performed by: Charlesetta Ivory P   Total critical care time: 45 minutes  Critical care time was exclusive of separately billable procedures and treating other patients.  Critical care was necessary to treat or prevent imminent or life-threatening deterioration.  Critical care was time spent personally by me on the following activities: development of treatment plan with patient and/or surrogate as well as nursing, discussions with consultants, evaluation of patient's response to treatment, examination of patient, obtaining history from patient or surrogate, ordering and performing treatments and interventions, ordering and review of laboratory studies, ordering and review of radiographic studies, pulse oximetry and re-evaluation of patient's condition.  ____________________________________________   INITIAL IMPRESSION / ASSESSMENT AND PLAN / ED COURSE  Pertinent labs & imaging results that were available during my care of  the patient were reviewed by me and considered in my medical decision making (see chart for details).  This is a 50 year old male who comes into the hospital today with some shortness of breath. The patient has some very tight wheezes on his exam and he is in some distress. He is hypoxic even on his normal O2. He has a temperature of 102 here in the emergency department. I will give the patient some Toradol 30 mg IV as well as some hydration. The patient was placed on BiPAP due to his respiratory distress. He has a blood gas that shows a PCO2 of 100. We will await the results of the blood work and admit the patient to the intensive care unit service. The patient received some fluid in his heart rate did improve while in the emergency department. He did receive some vancomycin and Zosyn for possible respiratory infection. The patient was admitted approximately 2 weeks ago to the hospital as  well. ____________________________________________   FINAL CLINICAL IMPRESSION(S) / ED DIAGNOSES  Final diagnoses:  COPD exacerbation ([place])  Acute respiratory failure with hypoxia and hypercapnia ([place])      NEW MEDICATIONS STARTED DURING THIS VISIT:  Current Discharge Medication List       Note:  This document was prepared using Dragon voice recognition software and may include unintentional dictation errors.    Loney Hering, MD 06/26/15 [phone]

## 2015-06-26 NOTE — Care Management (Signed)
Patient was admitted to the icu due to need for continuous bipap.  There is a documented entry on flow sheet that 02 device is ventilator.    Observed patient- he is on bipap.  Dr Mortimer Fries  discussed code status and patient is DNR.

## 2015-06-26 NOTE — ED Notes (Signed)
Pt is a smoked on 5LNC chronic.  Pt has history of COPD and CHF.  Pt reported to having respiratory issues since 6pm.  Took 4x albuterol treatments at home.  Pt was 77% on 5LNC at home with and end tidal of 62.  Enroute given 2 duonebs, end tidal decreased to 40 and o2 up to 98% during treatment.  Per EMS pt had 102.2 oral temp.  Also states pt coughed up large yellow sputum en route.  Pt panting upon arrival with duoneb in place.  Pt put on nonrebreather with O2 at 98% and end tidal at 58.

## 2015-06-26 NOTE — Progress Notes (Addendum)
Patient admitted this morning with severe respiratory failure from COPD exacerbation. Patient currently on BiPAP and lethargic.   I Spoke with patients mother , patient is SNR. Changed in system.  Continue current treatment.

## 2015-06-26 NOTE — Care Management (Signed)
Patient presents from a [facility]. Spoke with [name] from the [facility]. Patient has been a resident since Feb 2017.  He has home 02 at 5 liters but she is not sure of agency providing and has a cpap or bipap- not sure which sone.  Patient had a previous discharge from [facility] and it appears that [place] may be agency that is providing the home 02.  There is apparently some problems with [employer] office phones as they are all ringing a very quick busy.  Left voicemail for Leafy Ro with [employer] to confirm type of 02 equipment in the home.  [name] says that patient was not receiving any home health services.  Patient discharged from [facility] mid April with similar sx.  [name] says that patient is independent in his ambulation and does not see any reason that patient could not return.

## 2015-06-26 NOTE — Progress Notes (Signed)
Pt tolerating 4L [place] well. BIPAP on standby at this time.

## 2015-06-26 NOTE — Progress Notes (Signed)
Patient has been more awake since noon, able to get out of bed and use BSC, alert and oriented x4, also able to eat and drink. MD called and notified of status changes, mother called and is at bedside with pt. Pain meds have been resumed as previously taken. No ss of distress noted, pt off the BIPAP, on 6l [place]. VS wnl.

## 2015-06-26 NOTE — Progress Notes (Signed)
Pharmacy Antibiotic Note  Craig Wright is a 50 y.o. male admitted on 06/26/2015 with COPD exacerbation.  Pharmacy has been consulted for ceftriaxone dosing.  Plan: Ceftriaxone 1 gram q 24 hours ordered.     Temp (24hrs), Avg:101.2 F (38.4 C), Min:100.2 F (37.9 C), Max:102.2 F (39 C)   Recent Labs Lab 06/26/15 0238 06/26/15 0257  WBC 11.8*  --   CREATININE 0.58*  --   LATICACIDVEN  --  1.0    CrCl cannot be calculated (Unknown ideal weight.).    No Known Allergies  Antimicrobials this admission: ceftriaxone  >>    >>   Dose adjustments this admission:   Microbiology results: 5/3 BCx: pending   5/3 CXR: no acute diseae  Thank you for allowing pharmacy to be a part of this patient's care.  Danny Zimny S 06/26/2015 5:59 AM

## 2015-06-26 NOTE — Care Management (Signed)
Spoke with [place] from [facility].  Patient DOES have home bipap at the family care home. which was set up 4/19.  The setting was 12/6.   Leafy Ro will run report to assess compliance.  The [facility] caregiver did state that " when we found him he wasn't wearing anything."   Leafy Ro also confirmed agency provides continuous home 02.

## 2015-06-26 NOTE — Consult Note (Signed)
[facility] Consultation     Date: 06/26/2015,   MRN# [id number] Craig Wright 10-Mar-1965 Code Status:     Code Status Orders        Start     Ordered   06/26/15 1014  Do not attempt resuscitation (DNR)   Continuous    Question Answer Comment  In the event of cardiac or respiratory ARREST Do not call a "code blue"   In the event of cardiac or respiratory ARREST Do not perform Intubation, CPR, defibrillation or ACLS   In the event of cardiac or respiratory ARREST Use medication by any route, position, wound care, and other measures to relive pain and suffering. May use oxygen, suction and manual treatment of airway obstruction as needed for comfort.      06/26/15 1013    Code Status History    Date Active Date Inactive Code Status Order ID Comments User Context   06/26/2015  6:05 AM 06/26/2015 10:13 AM Full Code [id number]  Saundra Shelling, MD Inpatient   06/08/2015 12:15 AM 06/10/2015  4:32 PM Full Code [id number]  Saundra Shelling, MD Inpatient     Hosp day:'@LENGTHOFSTAYDAYS'$ @ Referring MD: '@ATDPROV'$ @     PCP:      AdmissionWeight: 207 lb 0.2 oz (93.9 kg)                 CurrentWeight: 207 lb 0.2 oz (93.9 kg)  CHIEF COMPLAINT:     Acute resp failure  Acute resp  acute HISTORY OF PRESENT ILLNESS    60  52 88 50 yo white male with acute and severe hypercapnic resp failure from end stage COPD 5 L [place] at home  Patient with severe resp distress, gcs<8, on biPAP, patient critically ill, high risk for death/cardiac arrest Patient is now DNR  Home Medication:  No current outpatient prescriptions on file.  Current Medication:   Current facility-administered medications:  .  0.9 %  sodium chloride infusion, , Intravenous, Continuous, Saundra Shelling, MD, Last Rate: 75 mL/hr at 06/26/15 0621 .  acetaminophen (TYLENOL) tablet 650 mg, 650 mg, Oral, Q6H PRN **OR** acetaminophen (TYLENOL) suppository 650 mg, 650 mg, Rectal, Q6H PRN, Pavan Pyreddy, MD .  albuterol  (PROVENTIL) (2.5 MG/3ML) 0.083% nebulizer solution 2.5 mg, 2.5 mg, Nebulization, Q2H PRN, Pavan Pyreddy, MD .  azithromycin (ZITHROMAX) 500 mg in dextrose 5 % 250 mL IVPB, 500 mg, Intravenous, Q24H, Pavan Pyreddy, MD, Last Rate: 250 mL/hr at 06/26/15 0516, 500 mg at 06/26/15 0516 .  [START ON 06/27/2015] cefTRIAXone (ROCEPHIN) 1 g in dextrose 5 % 50 mL IVPB, 1 g, Intravenous, Q24H, Pavan Pyreddy, MD .  clonazePAM (KLONOPIN) tablet 1 mg, 1 mg, Oral, BID, Pavan Pyreddy, MD, 1 mg at 06/26/15 1000 .  docusate sodium (COLACE) capsule 100 mg, 100 mg, Oral, BID, Saundra Shelling, MD, 100 mg at 06/26/15 0812 .  DULoxetine (CYMBALTA) DR capsule 60 mg, 60 mg, Oral, BID, Saundra Shelling, MD, 60 mg at 06/26/15 0258 .  enoxaparin (LOVENOX) injection 40 mg, 40 mg, Subcutaneous, Q24H, Pavan Pyreddy, MD .  ipratropium-albuterol (DUONEB) 0.5-2.5 (3) MG/3ML nebulizer solution 3 mL, 3 mL, Nebulization, Q6H, Pavan Pyreddy, MD, 3 mL at 06/26/15 0741 .  metFORMIN (GLUCOPHAGE-XR) 24 hr tablet 500 mg, 500 mg, Oral, Q breakfast, Saundra Shelling, MD, 500 mg at 06/26/15 0808 .  methylPREDNISolone sodium succinate (SOLU-MEDROL) 125 mg/2 mL injection 125 mg, 125 mg, Intravenous, Q6H, Pavan Pyreddy, MD, 125 mg at 06/26/15 0500 .  ondansetron (ZOFRAN) tablet 4 mg,  4 mg, Oral, Q6H PRN **OR** ondansetron (ZOFRAN) injection 4 mg, 4 mg, Intravenous, Q6H PRN, Pavan Pyreddy, MD .  risperiDONE (RISPERDAL) tablet 2 mg, 2 mg, Oral, QHS, Pavan Pyreddy, MD .  roflumilast (DALIRESP) tablet 500 mcg, 500 mcg, Oral, Daily, Saundra Shelling, MD, 500 mcg at 06/26/15 9509 .  sodium chloride flush (NS) 0.9 % injection 3 mL, 3 mL, Intravenous, Q12H, Pavan Pyreddy, MD, 3 mL at 06/26/15 1000     ALLERGIES   Review of patient's allergies indicates no known allergies.     REVIEW OF SYSTEMS   Review of Systems  Unable to perform ROS: critical illness     VS: BP 96/60 mmHg  Pulse 102  Temp(Src) 98.6 F (37 C) (Axillary)  Resp 24  Ht 6' (1.829 m)   Wt 207 lb 0.2 oz (93.9 kg)  BMI 28.07 kg/m2  SpO2 94%     PHYSICAL EXAM   Physical Exam  Constitutional: He appears distressed.  HENT:  Head: Normocephalic and atraumatic.  Eyes: Pupils are equal, round, and reactive to light. No scleral icterus.  Neck: Normal range of motion. Neck supple.  Cardiovascular: Normal rate and regular rhythm.   No murmur heard. Pulmonary/Chest: He is in respiratory distress. He has wheezes. He has rales.  resp distress  Abdominal: Soft. He exhibits no distension. There is no tenderness.  Musculoskeletal: He exhibits no edema.  Neurological: He displays normal reflexes. Coordination normal.  gcs<8  Skin: Skin is warm. No rash noted. He is diaphoretic.        LABS    Recent Labs     06/26/15  0238  06/26/15  0623  HGB  14.6  12.9*  HCT  44.1  39.2*  MCV  89.0  91.3  WBC  11.8*  10.9*  BUN  7  7  CREATININE  0.58*  0.59*  GLUCOSE  205*  314*  CALCIUM  9.5  8.2*  ,      CULTURE RESULTS   Recent Results (from the past 240 hour(s))  CULTURE, BLOOD (ROUTINE X 2) w Reflex to PCR ID Panel     Status: None (Preliminary result)   Collection Time: 06/26/15  2:57 AM  Result Value Ref Range Status   Specimen Description BLOOD RIGHT ANTECUBITAL  Final   Special Requests BOTTLES DRAWN AEROBIC AND ANAEROBIC 5ML  Final   Culture NO GROWTH < 12 HOURS  Final   Report Status PENDING  Incomplete  CULTURE, BLOOD (ROUTINE X 2) w Reflex to PCR ID Panel     Status: None (Preliminary result)   Collection Time: 06/26/15  2:59 AM  Result Value Ref Range Status   Specimen Description BLOOD RIGHT ANTECUBITAL  Final   Special Requests BOTTLES DRAWN AEROBIC AND ANAEROBIC 5ML  Final   Culture NO GROWTH < 12 HOURS  Final   Report Status PENDING  Incomplete  MRSA PCR Screening     Status: None   Collection Time: 06/26/15  6:04 AM  Result Value Ref Range Status   MRSA by PCR NEGATIVE NEGATIVE Final    Comment:        The GeneXpert MRSA Assay  (FDA approved for NASAL specimens only), is one component of a comprehensive MRSA colonization surveillance program. It is not intended to diagnose MRSA infection nor to guide or monitor treatment for MRSA infections.           IMAGING    Dg Chest Portable 1 View  06/26/2015  CLINICAL DATA:  Respiratory  distress.  Hypoxia. EXAM: PORTABLE CHEST 1 VIEW COMPARISON:  06/07/2015 FINDINGS: Mild chronic interstitial coarsening. The lungs are otherwise clear. Heart size is normal. Pulmonary vasculature is normal. Hilar and mediastinal contours are unremarkable and unchanged. No large effusion. IMPRESSION: Chronic appearing interstitial coarsening. No acute cardiopulmonary findings. Electronically Signed   By: Andreas Newport M.D.   On: 06/26/2015 02:54        ASSESSMENT/PLAN   50 yo white male with acute COPD exacerbation with severe resp failure end stage COPD   1.continue biPAP 2.continue IV steroids, IV abx, aggressive BD therapy 3.patient is DNR/DNI  Will plan to wean biPAP as tolerated and assess neuro status   I have personally obtained a history, examined the patient, evaluated Pertinent laboratory and RadioGraphic/imaging results, and  formulated the assessment and plan   The Patient requires high complexity decision making for assessment and support, frequent evaluation and titration of therapies, application of advanced monitoring technologies and extensive interpretation of multiple databases. Critical Care Time devoted to patient care services described in this note is 35 minutes.   Overall, patient is critically ill, prognosis is guarded.  Patient with Multiorgan failure and at high risk for cardiac arrest and death.    Corrin Parker, M.D.  [facility]  Medical Director [place] Director [facility] Cardio-Pulmonary Department

## 2015-06-26 NOTE — NC FL2 (Signed)
[place] LEVEL OF CARE SCREENING TOOL     IDENTIFICATION  Patient Name: Craig Wright Birthdate: 1965-06-02 Sex: male Admission Date (Current Location): 06/26/2015  [facility] and [place] Number:  Craig Wright  ([id number] [facility]) Facility and Address:  [facility], [address], [place], [place] [address]      Provider Number: [id number]  Attending Physician Name and Address:  Bettey Costa, MD  Relative Name and Phone Number:       Current Level of Care: Domiciliary (Rest home) Recommended Level of Care: [facility] Prior Approval Number:    Date Approved/Denied:   PASRR Number:  ( [id number] )  Discharge Plan: Domiciliary (Rest home)    Current Diagnoses: Patient Active Problem List   Diagnosis Date Noted  . Acute respiratory failure with hypercapnia ([place]) 06/26/2015  . COPD with acute exacerbation ([place]) 06/26/2015  . Dyspnea 06/07/2015    Orientation RESPIRATION BLADDER Height & Weight     Self, Time, Situation, Place  O2, Other (Comment) (On Bi-pap ) Continent Weight: 207 lb 0.2 oz (93.9 kg) Height:  6' (182.9 cm)  BEHAVIORAL SYMPTOMS/MOOD NEUROLOGICAL BOWEL NUTRITION STATUS   (none )  (none ) Continent Diet (Diet: Heart Healthy/ Carb Modified )  AMBULATORY STATUS COMMUNICATION OF NEEDS Skin   Supervision Verbally Normal                       Personal Care Assistance Level of Assistance  Bathing, Feeding, Dressing Bathing Assistance: Independent Feeding assistance: Independent Dressing Assistance: Independent     Functional Limitations Info  Sight, Hearing, Speech Sight Info: Adequate Hearing Info: Adequate Speech Info: Adequate    SPECIAL CARE FACTORS FREQUENCY                       Contractures      Additional Factors Info  Code Status, Allergies, Psychotropic, Insulin Sliding Scale Code Status Info:  (DNR ) Allergies Info:  (No Known Allergies. ) Psychotropic Info:  (Risperdal ) Insulin  Sliding Scale Info:  (NovoLog Insulin Injections 3 times daily )       Current Medications (06/26/2015):  This is the current hospital active medication list Current Facility-Administered Medications  Medication Dose Route Frequency Provider Last Rate Last Dose  . acetaminophen (TYLENOL) tablet 650 mg  650 mg Oral Q6H PRN Saundra Shelling, MD       Or  . acetaminophen (TYLENOL) suppository 650 mg  650 mg Rectal Q6H PRN Pavan Pyreddy, MD      . albuterol (PROVENTIL) (2.5 MG/3ML) 0.083% nebulizer solution 2.5 mg  2.5 mg Nebulization Q2H PRN Pavan Pyreddy, MD      . azithromycin (ZITHROMAX) 500 mg in dextrose 5 % 250 mL IVPB  500 mg Intravenous Q24H Saundra Shelling, MD 250 mL/hr at 06/26/15 0516 500 mg at 06/26/15 0516  . budesonide (PULMICORT) nebulizer solution 0.5 mg  0.5 mg Nebulization BID Flora Lipps, MD   0.5 mg at 06/26/15 1225  . [START ON 06/27/2015] cefTRIAXone (ROCEPHIN) 1 g in dextrose 5 % 50 mL IVPB  1 g Intravenous Q24H Pavan Pyreddy, MD      . clonazePAM (KLONOPIN) tablet 1 mg  1 mg Oral BID Saundra Shelling, MD   1 mg at 06/26/15 1000  . docusate sodium (COLACE) capsule 100 mg  100 mg Oral BID Saundra Shelling, MD   100 mg at 06/26/15 0812  . DULoxetine (CYMBALTA) DR capsule 60 mg  60 mg Oral  BID Saundra Shelling, MD   60 mg at 06/26/15 0093  . enoxaparin (LOVENOX) injection 40 mg  40 mg Subcutaneous Q24H Pavan Pyreddy, MD      . insulin aspart (novoLOG) injection 0-15 Units  0-15 Units Subcutaneous TID WC Flora Lipps, MD   5 Units at 06/26/15 1232  . insulin aspart (novoLOG) injection 0-5 Units  0-5 Units Subcutaneous QHS Flora Lipps, MD      . ipratropium-albuterol (DUONEB) 0.5-2.5 (3) MG/3ML nebulizer solution 3 mL  3 mL Nebulization Q4H Flora Lipps, MD   3 mL at 06/26/15 1225  . metFORMIN (GLUCOPHAGE-XR) 24 hr tablet 500 mg  500 mg Oral Q breakfast Saundra Shelling, MD   500 mg at 06/26/15 0808  . [START ON 06/27/2015] methylPREDNISolone sodium succinate (SOLU-MEDROL) 40 mg/mL injection 40 mg  40 mg  Intravenous Daily Flora Lipps, MD      . ondansetron (ZOFRAN) tablet 4 mg  4 mg Oral Q6H PRN Saundra Shelling, MD       Or  . ondansetron (ZOFRAN) injection 4 mg  4 mg Intravenous Q6H PRN Pavan Pyreddy, MD      . risperiDONE (RISPERDAL) tablet 2 mg  2 mg Oral QHS Pavan Pyreddy, MD      . roflumilast (DALIRESP) tablet 500 mcg  500 mcg Oral Daily Saundra Shelling, MD   500 mcg at 06/26/15 8182  . sodium chloride flush (NS) 0.9 % injection 3 mL  3 mL Intravenous Q12H Saundra Shelling, MD   3 mL at 06/26/15 1000     Discharge Medications: Please see discharge summary for a list of discharge medications.  Relevant Imaging Results:  Relevant Lab Results:   Additional Information  (SSN: [id number])  Craig Freshwater, LCSW

## 2015-06-26 NOTE — Progress Notes (Signed)
Talked with pt's mother regarding pt's status and interventions. Mother states that pt has made his wished clear that if anything happens to him he does not wish to be placed on life support, he also does not want aggressive measure like CPR if his heart stopped, she states that pt need to be kept comfortable from pain. I stated that we will discuss these decisions with MD during rounds and make sure MD calls pt's mother.

## 2015-06-26 NOTE — H&P (Signed)
[place] at [place] NAME: Craig Wright    MR#:  [id number]  DATE OF BIRTH:  [birth year]  DATE OF ADMISSION:  06/26/2015  PRIMARY CARE PHYSICIAN: No primary care provider on file.   REQUESTING/REFERRING PHYSICIAN:   CHIEF COMPLAINT:   Chief Complaint  Patient presents with  . Respiratory Distress    HISTORY OF PRESENT ILLNESS: Craig Wright  is a 50 y.o. male with a known history of COPD on oxygen via nasal cannula at home at 5 L experienced shortness of breath from 6 PM last night. He tried albuterol breathing treatments 4 times at home but did not help. Patient was given nebulization treatments en route to the hospital was put on nonrebreather initially. Was worked up in the emergency room with CO2 level was 100 use put on a BiPAP. Patient had an episode of fever when he presented to the emergency room. Has occasional cough. No complaints of any chest pain. No history of orthopnea or proximal nocturnal dyspnea. Patient was stabilized on BiPAP and hospitalist service was consulted for further care. Patient was also given IV antibiotics in the emergency room. Shortness of breath which started yesterday evening progressively worsened. No history of recent travel or sick contacts at home. No history of abdominal pain nausea vomiting or diarrhea.  PAST MEDICAL HISTORY:   Past Medical History  Diagnosis Date  . Diabetes mellitus without complication ([place])   . Asthma   . Hypertension   . COPD (chronic obstructive pulmonary disease) ([place])   . Anxiety disorder     PAST SURGICAL HISTORY: Past Surgical History  Procedure Laterality Date  . Orthopedic surgery      multiple    SOCIAL HISTORY:  Social History  Substance Use Topics  . Smoking status: Current Some Day Smoker  . Smokeless tobacco: Not on file  . Alcohol Use: 0.0 oz/week    0 Standard drinks or equivalent per week    FAMILY HISTORY:  Family History  Problem Relation  Age of Onset  . Cervical cancer Mother   . Colon cancer Father     DRUG ALLERGIES: No Known Allergies  REVIEW OF SYSTEMS:   CONSTITUTIONAL: No fever, fatigue or weakness.  EYES: No blurred or double vision.  EARS, NOSE, AND THROAT: No tinnitus or ear pain.  RESPIRATORY: occasional cough, has shortness of breath, has wheezing , no hemoptysis.  CARDIOVASCULAR: No chest pain, orthopnea, edema.  GASTROINTESTINAL: No nausea, vomiting, diarrhea or abdominal pain.  GENITOURINARY: No dysuria, hematuria.  ENDOCRINE: No polyuria, nocturia,  HEMATOLOGY: No anemia, easy bruising or bleeding SKIN: No rash or lesion. MUSCULOSKELETAL: No joint pain or arthritis.   NEUROLOGIC: No tingling, numbness, weakness.  PSYCHIATRY: No anxiety or depression.   MEDICATIONS AT HOME:  Prior to Admission medications   Medication Sig Start Date End Date Taking? Authorizing Provider  albuterol (PROVENTIL HFA;VENTOLIN HFA) 108 (90 Base) MCG/ACT inhaler Inhale 2 puffs into the lungs every 6 (six) hours as needed for wheezing or shortness of breath.   Yes Historical Provider, MD  albuterol-ipratropium (COMBIVENT) 18-103 MCG/ACT inhaler Inhale 2 puffs into the lungs every 4 (four) hours as needed for wheezing or shortness of breath.   Yes Historical Provider, MD  clonazePAM (KLONOPIN) 1 MG tablet Take 1 mg by mouth 2 (two) times daily.   Yes Historical Provider, MD  DULoxetine (CYMBALTA) 60 MG capsule Take 60 mg by mouth 2 (two) times daily.    Yes Historical Provider, MD  furosemide (LASIX) 20 MG tablet Take 40 mg by mouth daily.    Yes Historical Provider, MD  metFORMIN (GLUMETZA) 500 MG (MOD) 24 hr tablet Take 500 mg by mouth daily with breakfast. Reported on 06/26/2015   Yes Historical Provider, MD  mirtazapine (REMERON) 45 MG tablet Take 45 mg by mouth at bedtime.   Yes Historical Provider, MD  oxycodone (OXY-IR) 5 MG capsule Take 10 mg by mouth every 6 (six) hours as needed.   Yes Historical Provider, MD  oxyCODONE  (OXYCONTIN) 20 mg 12 hr tablet Take 20 mg by mouth every 12 (twelve) hours.   Yes Historical Provider, MD  risperiDONE (RISPERDAL) 2 MG tablet Take 2 mg by mouth at bedtime.   Yes Historical Provider, MD  roflumilast (DALIRESP) 500 MCG TABS tablet Take 500 mcg by mouth daily.   Yes Historical Provider, MD  tiotropium (SPIRIVA) 18 MCG inhalation capsule Place 18 mcg into inhaler and inhale daily.   Yes Historical Provider, MD      PHYSICAL EXAMINATION:   VITAL SIGNS: Blood pressure 144/93, pulse 138, temperature 102.2 F (39 C), temperature source Axillary, resp. rate 29, SpO2 92 %.  GENERAL:  50 y.o.-year-old patient lying in the bed with no acute distress.  EYES: Pupils equal, round, reactive to light and accommodation. No scleral icterus. Extraocular muscles intact.  HEENT: Head atraumatic, normocephalic. Oropharynx and nasopharynx clear.  NECK:  Supple, no jugular venous distention. No thyroid enlargement, no tenderness.  LUNGS: Decreased breath sounds bilaterally, has bilateral wheezing, no rales,rhonchi or crepitation. No use of accessory muscles of respiration.  CARDIOVASCULAR: S1, S2 normal. No murmurs, rubs, or gallops.  ABDOMEN: Soft, nontender, nondistended. Bowel sounds present. No organomegaly or mass.  EXTREMITIES: No pedal edema, cyanosis, or clubbing.  NEUROLOGIC: Cranial nerves II through XII are intact. Muscle strength 5/5 in all extremities. Sensation intact. Gait not checked.  PSYCHIATRIC: The patient is alert and oriented x 3.  SKIN: No obvious rash, lesion, or ulcer.   LABORATORY PANEL:   CBC  Recent Labs Lab 06/26/15 0238  WBC 11.8*  HGB 14.6  HCT 44.1  PLT 103*  MCV 89.0  MCH 29.6  MCHC 33.2  RDW 13.8   ------------------------------------------------------------------------------------------------------------------  Chemistries   Recent Labs Lab 06/26/15 0238  NA 142  K 3.6  CL 93*  CO2 40*  GLUCOSE 205*  BUN 7  CREATININE 0.58*  CALCIUM  9.5  AST 20  ALT 23  ALKPHOS 44  BILITOT 0.7   ------------------------------------------------------------------------------------------------------------------ CrCl cannot be calculated (Unknown ideal weight.). ------------------------------------------------------------------------------------------------------------------ No results for input(s): TSH, T4TOTAL, T3FREE, THYROIDAB in the last 72 hours.  Invalid input(s): FREET3   Coagulation profile No results for input(s): INR, PROTIME in the last 168 hours. ------------------------------------------------------------------------------------------------------------------- No results for input(s): DDIMER in the last 72 hours. -------------------------------------------------------------------------------------------------------------------  Cardiac Enzymes  Recent Labs Lab 06/26/15 0238  TROPONINI <0.03   ------------------------------------------------------------------------------------------------------------------ Invalid input(s): POCBNP  ---------------------------------------------------------------------------------------------------------------  Urinalysis    Component Value Date/Time   COLORURINE YELLOW* 06/07/2015 1939   COLORURINE Yellow 12/26/2012 1502   APPEARANCEUR CLEAR* 06/07/2015 1939   APPEARANCEUR Clear 12/26/2012 1502   LABSPEC 1.009 06/07/2015 1939   LABSPEC 1.011 12/26/2012 1502   PHURINE 8.0 06/07/2015 1939   PHURINE 7.0 12/26/2012 1502   GLUCOSEU 50* 06/07/2015 1939   GLUCOSEU Negative 12/26/2012 1502   HGBUR NEGATIVE 06/07/2015 1939   HGBUR Negative 12/26/2012 1502   BILIRUBINUR NEGATIVE 06/07/2015 1939   BILIRUBINUR Negative 12/26/2012 [place] 06/07/2015 1939  KETONESUR Negative 12/26/2012 1502   PROTEINUR NEGATIVE 06/07/2015 1939   PROTEINUR Negative 12/26/2012 1502   NITRITE NEGATIVE 06/07/2015 1939   NITRITE Negative 12/26/2012 1502   LEUKOCYTESUR NEGATIVE  06/07/2015 1939   LEUKOCYTESUR Negative 12/26/2012 1502     RADIOLOGY: Dg Chest Portable 1 View  06/26/2015  CLINICAL DATA:  Respiratory distress.  Hypoxia. EXAM: PORTABLE CHEST 1 VIEW COMPARISON:  06/07/2015 FINDINGS: Mild chronic interstitial coarsening. The lungs are otherwise clear. Heart size is normal. Pulmonary vasculature is normal. Hilar and mediastinal contours are unremarkable and unchanged. No large effusion. IMPRESSION: Chronic appearing interstitial coarsening. No acute cardiopulmonary findings. Electronically Signed   By: Andreas Newport M.D.   On: 06/26/2015 02:54    EKG: Orders placed or performed during the hospital encounter of 06/26/15  . ED EKG  . ED EKG  . EKG 12-Lead  . EKG 12-Lead    IMPRESSION AND PLAN: 50 year old male patient with history of COPD on oxygen via nasal cannula at home, chronic respiratory failure, anxiety disorder, hypertension presented to the emergency room with increased shortness of breath since yesterday evening. Upon evaluation in the emergency room patient's current level was around 100 and was put on BiPAP. Admitting diagnosis 1. Acute on chronic respiratory failure 2. Acute hypercapnic respiratory failure 3. Acute COPD exacerbation 4. Hypertension 5. Anxiety disorder Treatment plan mid patient to ICU stepdown unit IV Solu-Medrol 125 mg every 6 hourly Duonebulizations around the clock Continue BiPAP for oxygen supply and for hypercapnia DVT prophylaxis subcutaneous Lovenox 40 MG daily Start patient on IV Rocephin and IV Zithromax antibiotics. Supportive care  All the records are reviewed and case discussed with ED provider. Management plans discussed with the patient, family and they are in agreement.  CODE STATUS:FULL Code Status History    Date Active Date Inactive Code Status Order ID Comments User Context   06/08/2015 12:15 AM 06/10/2015  4:32 PM Full Code [id number]  Saundra Shelling, MD Inpatient       TOTAL CRITICAL CARE  TIME TAKING CARE OF THIS PATIENT: 57 minutes.    Saundra Shelling M.D on 06/26/2015 at 4:54 AM  Between 7am to 6pm - Pager - [phone]  After 6pm go to [email or web address] - password [email or web address] [facility]  [place] [facility]  Office  [phone]  CC: Primary care physician; No primary care provider on file.

## 2015-06-26 NOTE — Progress Notes (Signed)
Inpatient Diabetes Program Recommendations  AACE/ADA: New Consensus Statement on Inpatient Glycemic Control (2015)  Target Ranges:  Prepandial:   less than 140 mg/dL      Peak postprandial:   less than 180 mg/dL (1-2 hours)      Critically ill patients:  140 - 180 mg/dL   Review of Glycemic Control Results for Craig Wright, Craig Wright (MRN [id number]) as of 06/26/2015 09:11  Ref. Range 06/26/2015 07:35  Glucose-Capillary Latest Ref Range: 65-99 mg/dL 302 (H)   Diabetes history: DM Type 2 Outpatient Diabetes medications: Metformin 500 mg q d Current orders for Inpatient glycemic control: Metformin 500 mg + Solumedrol 125 q 6 hrs.  Inpatient Diabetes Program Recommendations:  Please consider while on steroids, Insulin - Basal: Novolog 18 units q d  Correction (SSI): Novolog Sensitive Correction 0-9 tid + hs 0-5 HgbA1C: Please consider A1C to determine prehospitalization glycemic control.  Thank you, Nani Gasser. Dane Bloch, RN, MSN, CDE Inpatient Glycemic Control Team Team Pager [phone] (8am-5pm) 06/26/2015 9:21 AM

## 2015-06-27 LAB — CBC
HEMATOCRIT: 35.5 % — AB (ref 40.0–52.0)
HEMOGLOBIN: 11.8 g/dL — AB (ref 13.0–18.0)
MCH: 29.7 pg (ref 26.0–34.0)
MCHC: 33.3 g/dL (ref 32.0–36.0)
MCV: 89.1 fL (ref 80.0–100.0)
Platelets: 112 10*3/uL — ABNORMAL LOW (ref 150–440)
RBC: 3.99 MIL/uL — ABNORMAL LOW (ref 4.40–5.90)
RDW: 14.2 % (ref 11.5–14.5)
WBC: 7.7 10*3/uL (ref 3.8–10.6)

## 2015-06-27 LAB — BASIC METABOLIC PANEL
Anion gap: 7 (ref 5–15)
BUN: 10 mg/dL (ref 6–20)
CHLORIDE: 94 mmol/L — AB (ref 101–111)
CO2: 36 mmol/L — AB (ref 22–32)
CREATININE: 0.48 mg/dL — AB (ref 0.61–1.24)
Calcium: 8.3 mg/dL — ABNORMAL LOW (ref 8.9–10.3)
GFR calc Af Amer: >60 mL/min (ref >60)
GFR calc non Af Amer: >60 mL/min (ref >60)
Glucose, Bld: 131 mg/dL — ABNORMAL HIGH (ref 65–99)
Potassium: 3.5 mmol/L (ref 3.5–5.1)
Sodium: 137 mmol/L (ref 135–145)

## 2015-06-27 LAB — GLUCOSE, CAPILLARY
Glucose-Capillary: 145 mg/dL — ABNORMAL HIGH (ref 65–99)
Glucose-Capillary: 141 mg/dL — ABNORMAL HIGH (ref 65–99)
Glucose-Capillary: 194 mg/dL — ABNORMAL HIGH (ref 65–99)
Glucose-Capillary: 184 mg/dL — ABNORMAL HIGH (ref 65–99)

## 2015-06-27 NOTE — Progress Notes (Signed)
[place] at [place] NAME: Craig Wright    MR#:  [id number]  DATE OF BIRTH:  13-Jan-1966  SUBJECTIVE:  Patient feels better today. He is wondering when he will go home. He is off BiPAP however sats dropping to the lower 80s pretty quickly. Patient asymptomatic not in respiratory distress.  REVIEW OF SYSTEMS:   Review of Systems  Constitutional: Negative for fever, chills and weight loss.  HENT: Negative for ear discharge, ear pain and nosebleeds.   Eyes: Negative for blurred vision, pain and discharge.  Respiratory: Positive for shortness of breath. Negative for sputum production, wheezing and stridor.   Cardiovascular: Negative for chest pain, palpitations, orthopnea and PND.  Gastrointestinal: Negative for nausea, vomiting, abdominal pain and diarrhea.  Genitourinary: Negative for urgency and frequency.  Musculoskeletal: Negative for back pain and joint pain.  Neurological: Negative for sensory change, speech change, focal weakness and weakness.  Psychiatric/Behavioral: Negative for depression and hallucinations. The patient is not nervous/anxious.   All other systems reviewed and are negative.  Tolerating Diet: Yes Tolerating PT: Pending  DRUG ALLERGIES:  No Known Allergies  VITALS:  Blood pressure 101/68, pulse 105, temperature 98.4 F (36.9 C), temperature source Axillary, resp. rate 24, height 6' (1.829 m), weight 93.9 kg (207 lb 0.2 oz), SpO2 92 %.  PHYSICAL EXAMINATION:   Physical Exam  GENERAL:  50 y.o.-year-old patient lying in the bed with no acute distress.  EYES: Pupils equal, round, reactive to light and accommodation. No scleral icterus. Extraocular muscles intact.  HEENT: Head atraumatic, normocephalic. Oropharynx and nasopharynx clear.  NECK:  Supple, no jugular venous distention. No thyroid enlargement, no tenderness.  LUNGS:distant breath sounds bilaterally, no wheezing, rales, rhonchi. No use of  accessory muscles of respiration.  CARDIOVASCULAR: S1, S2 normal. No murmurs, rubs, or gallops.  ABDOMEN: Soft, nontender, nondistended. Bowel sounds present. No organomegaly or mass.  EXTREMITIES: No cyanosis, clubbing or edema b/l.    NEUROLOGIC: Cranial nerves II through XII are intact. No focal Motor or sensory deficits b/l.   PSYCHIATRIC:  patient is alert and oriented x 3.  SKIN: No obvious rash, lesion, or ulcer.   LABORATORY PANEL:  CBC  Recent Labs Lab 06/27/15 0401  WBC 7.7  HGB 11.8*  HCT 35.5*  PLT 112*    Chemistries   Recent Labs Lab 06/26/15 0238  06/27/15 0401  NA 142  < > 137  K 3.6  < > 3.5  CL 93*  < > 94*  CO2 40*  < > 36*  GLUCOSE 205*  < > 131*  BUN 7  < > 10  CREATININE 0.58*  < > 0.48*  CALCIUM 9.5  < > 8.3*  AST 20  --   --   ALT 23  --   --   ALKPHOS 44  --   --   BILITOT 0.7  --   --   < > = values in this interval not displayed. Cardiac Enzymes  Recent Labs Lab 06/26/15 0238  TROPONINI <0.03   RADIOLOGY:  Dg Chest Portable 1 View  06/26/2015  CLINICAL DATA:  Respiratory distress.  Hypoxia. EXAM: PORTABLE CHEST 1 VIEW COMPARISON:  06/07/2015 FINDINGS: Mild chronic interstitial coarsening. The lungs are otherwise clear. Heart size is normal. Pulmonary vasculature is normal. Hilar and mediastinal contours are unremarkable and unchanged. No large effusion. IMPRESSION: Chronic appearing interstitial coarsening. No acute cardiopulmonary findings. Electronically Signed   By: Valerie Roys.D.  On: 06/26/2015 02:54   ASSESSMENT AND PLAN:  Craig Wright is a 50 y.o. male with a known history of COPD on oxygen via nasal cannula at home at 5 L experienced shortness of breath from 6 PM last night. He tried albuterol breathing treatments 4 times at home but did not help. Patient was given nebulization treatments en route to the hospital was put on nonrebreather initially. Was worked up in the emergency room with CO2 level was 100 use put on a  BiPAP.  1.Acute on chronic respiratory failure secondary to end-stage COPD. -Patient improving slowly. His 40s of nasal cannula. Sats anywhere from 83-93%. Intermittent BiPAP. Mentating well. -Appreciate pulmonary input -IV steroids -. Antibiotic antibiotics. Chest x-ray no evidence of pneumonia. If cultures remain negative patient afebrile we'll discontinue antibiotics  2. Acute hypercapnic respiratory failure - patient's mentation improved   3. Acute COPD exacerbation Treatment as above  4. Hypertension Resume home meds 5. Anxiety disorder -Continue risperidone and Cymbalta  Palliative care consultation pending Case discussed with Care Management/Social Worker. Management plans discussed with the patient, family and they are in agreement.  CODE STATUS: DO NOT RESUSCITATE  DVT Prophylaxis: Lovenox TOTAL TIME TAKING CARE OF THIS PATIENT: 35 minutes.  >50% time spent on counselling and coordination of care  POSSIBLE D/C IN 2-3 DAYS, DEPENDING ON CLINICAL CONDITION.  Note: This dictation was prepared with Dragon dictation along with smaller phrase technology. Any transcriptional errors that result from this process are unintentional.  Craig Wright M.D on 06/27/2015 at 3:10 PM  Between 7am to 6pm - Pager - [phone]  After 6pm go to [email or web address] - password [email or web address] [facility]  [facility]  Office  [phone]  CC: Primary care physician; No primary care provider on file.

## 2015-06-27 NOTE — Plan of Care (Signed)
Problem: ICU Phase Progression Outcomes Goal: O2 sats trending toward baseline Outcome: Not Progressing Pt still requires PRN bipap for low O2 sat.s

## 2015-06-27 NOTE — Progress Notes (Signed)
Placed on Bipap due to low O2 sat

## 2015-06-27 NOTE — Progress Notes (Signed)
Chaplain rounded the unit and provided a compassionate presence and spiritual support (silent prayer) to the patient.  Craig Wright [phone]

## 2015-06-27 NOTE — Consult Note (Signed)
[facility] Consultation     Date: 06/27/2015,   MRN# [id number] Craig Wright 09-09-1965 Code Status:     Code Status Orders        Start     Ordered   06/26/15 1014  Do not attempt resuscitation (DNR)   Continuous    Question Answer Comment  In the event of cardiac or respiratory ARREST Do not call a "code blue"   In the event of cardiac or respiratory ARREST Do not perform Intubation, CPR, defibrillation or ACLS   In the event of cardiac or respiratory ARREST Use medication by any route, position, wound care, and other measures to relive pain and suffering. May use oxygen, suction and manual treatment of airway obstruction as needed for comfort.      06/26/15 1013    Code Status History    Date Active Date Inactive Code Status Order ID Comments User Context   06/26/2015  6:05 AM 06/26/2015 10:13 AM Full Code [id number]  Saundra Shelling, MD Inpatient   06/08/2015 12:15 AM 06/10/2015  4:32 PM Full Code [id number]  Saundra Shelling, MD Inpatient     Hosp day:'@LENGTHOFSTAYDAYS'$ @ Referring MD: '@ATDPROV'$ @     PCP:      AdmissionWeight: 207 lb 0.2 oz (93.9 kg)                 CurrentWeight: 207 lb 0.2 oz (93.9 kg)  CHIEF COMPLAINT:     Acute resp failure  Acute resp  acute HISTORY OF PRESENT ILLNESS    52  52 50 Patient remains on biPAP, increased WOB, remains hypoxic Unable to wean off biPAP   Home Medication:  No current outpatient prescriptions on file.  Current Medication:   Current facility-administered medications:  .  acetaminophen (TYLENOL) tablet 650 mg, 650 mg, Oral, Q6H PRN **OR** acetaminophen (TYLENOL) suppository 650 mg, 650 mg, Rectal, Q6H PRN, Pavan Pyreddy, MD .  albuterol (PROVENTIL) (2.5 MG/3ML) 0.083% nebulizer solution 2.5 mg, 2.5 mg, Nebulization, Q2H PRN, Pavan Pyreddy, MD .  azithromycin (ZITHROMAX) 500 mg in dextrose 5 % 250 mL IVPB, 500 mg, Intravenous, Q24H, Pavan Pyreddy, MD, Last Rate: 250 mL/hr at 06/27/15 0419, 500 mg at  06/27/15 0419 .  budesonide (PULMICORT) nebulizer solution 0.5 mg, 0.5 mg, Nebulization, BID, Flora Lipps, MD, 0.5 mg at 06/27/15 0752 .  cefTRIAXone (ROCEPHIN) 1 g in dextrose 5 % 50 mL IVPB, 1 g, Intravenous, Q24H, Pavan Pyreddy, MD, 1 g at 06/27/15 1112 .  clonazePAM (KLONOPIN) tablet 1 mg, 1 mg, Oral, BID, Saundra Shelling, MD, 1 mg at 06/27/15 1109 .  docusate sodium (COLACE) capsule 100 mg, 100 mg, Oral, BID, Pavan Pyreddy, MD, 100 mg at 06/27/15 1110 .  DULoxetine (CYMBALTA) DR capsule 60 mg, 60 mg, Oral, BID, Saundra Shelling, MD, 60 mg at 06/27/15 1110 .  enoxaparin (LOVENOX) injection 40 mg, 40 mg, Subcutaneous, Q24H, Pavan Pyreddy, MD, 40 mg at 06/26/15 2106 .  insulin aspart (novoLOG) injection 0-15 Units, 0-15 Units, Subcutaneous, TID WC, Flora Lipps, MD, 2 Units at 06/27/15 0837 .  insulin aspart (novoLOG) injection 0-5 Units, 0-5 Units, Subcutaneous, QHS, Flora Lipps, MD, 0 Units at 06/26/15 2117 .  ipratropium-albuterol (DUONEB) 0.5-2.5 (3) MG/3ML nebulizer solution 3 mL, 3 mL, Nebulization, Q4H, Flora Lipps, MD, 3 mL at 06/27/15 0752 .  metFORMIN (GLUCOPHAGE-XR) 24 hr tablet 500 mg, 500 mg, Oral, Q breakfast, Saundra Shelling, MD, 500 mg at 06/27/15 0321 .  methylPREDNISolone sodium succinate (SOLU-MEDROL) 40 mg/mL injection 40 mg,  40 mg, Intravenous, Daily, Flora Lipps, MD, 40 mg at 06/27/15 1111 .  ondansetron (ZOFRAN) tablet 4 mg, 4 mg, Oral, Q6H PRN **OR** ondansetron (ZOFRAN) injection 4 mg, 4 mg, Intravenous, Q6H PRN, Pavan Pyreddy, MD .  oxyCODONE (Oxy IR/ROXICODONE) immediate release tablet 10 mg, 10 mg, Oral, Q4H PRN, Bettey Costa, MD, 10 mg at 06/26/15 1658 .  oxyCODONE (OXYCONTIN) 12 hr tablet 20 mg, 20 mg, Oral, Q12H, Bettey Costa, MD, 20 mg at 06/27/15 1110 .  risperiDONE (RISPERDAL) tablet 2 mg, 2 mg, Oral, QHS, Pavan Pyreddy, MD, 2 mg at 06/26/15 2106 .  roflumilast (DALIRESP) tablet 500 mcg, 500 mcg, Oral, Daily, Saundra Shelling, MD, 500 mcg at 06/27/15 1110 .  sodium chloride  flush (NS) 0.9 % injection 3 mL, 3 mL, Intravenous, Q12H, Pavan Pyreddy, MD, 3 mL at 06/27/15 1112     ALLERGIES   Review of patient's allergies indicates no known allergies.     REVIEW OF SYSTEMS   Review of Systems  Unable to perform ROS: critical illness     VS: BP 101/68 mmHg  Pulse 105  Temp(Src) 98.4 F (36.9 C) (Axillary)  Resp 24  Ht 6' (1.829 m)  Wt 207 lb 0.2 oz (93.9 kg)  BMI 28.07 kg/m2  SpO2 95%     PHYSICAL EXAM   Physical Exam  Constitutional: He appears distressed.  HENT:  Head: Normocephalic and atraumatic.  Eyes: Pupils are equal, round, and reactive to light. No scleral icterus.  Neck: Normal range of motion. Neck supple.  Cardiovascular: Normal rate and regular rhythm.   No murmur heard. Pulmonary/Chest: He is in respiratory distress. He has wheezes. He has rales.  resp distress  Abdominal: Soft. He exhibits no distension. There is no tenderness.  Musculoskeletal: He exhibits no edema.  Neurological: He displays normal reflexes. Coordination normal.  gcs<8  Skin: Skin is warm. No rash noted. He is diaphoretic.        LABS    Recent Labs     06/26/15  0238  06/26/15  0623  06/27/15  0401  HGB  14.6  12.9*  11.8*  HCT  44.1  39.2*  35.5*  MCV  89.0  91.3  89.1  WBC  11.8*  10.9*  7.7  BUN  '7  7  10  '$ CREATININE  0.58*  0.59*  0.48*  GLUCOSE  205*  314*  131*  CALCIUM  9.5  8.2*  8.3*  ,      CULTURE RESULTS      ASSESSMENT/PLAN   50 yo white male with acute COPD exacerbation with severe resp failure end stage COPD   1.continue biPAP 2.continue IV steroids, IV abx, aggressive BD therapy 3.patient is DNR/DNI 4.palliative care consulted  Will plan to wean biPAP as tolerated    I have personally obtained a history, examined the patient, evaluated Pertinent laboratory and RadioGraphic/imaging results, and  formulated the assessment and plan   The Patient requires high complexity decision making for assessment  and support, frequent evaluation and titration of therapies, application of advanced monitoring technologies and extensive interpretation of multiple databases. Critical Care Time devoted to patient care services described in this note is 35 minutes.   Overall, patient is critically ill, prognosis is guarded.  Patient with Multiorgan failure and at high risk for cardiac arrest and death.    Corrin Parker, M.D.  [facility]  Medical Director [place] Director [facility] Cardio-Pulmonary Department

## 2015-06-28 LAB — GLUCOSE, CAPILLARY
GLUCOSE-CAPILLARY: 136 mg/dL — AB (ref 65–99)
Glucose-Capillary: 167 mg/dL — ABNORMAL HIGH (ref 65–99)

## 2015-06-28 MED ORDER — IPRATROPIUM-ALBUTEROL 0.5-2.5 (3) MG/3ML IN SOLN: 3.0000 mL | RESPIRATORY_TRACT | Status: DC | PRN

## 2015-06-28 MED ORDER — LEVOFLOXACIN 500 MG PO TABS: 500.0000 mg | ORAL_TABLET | Freq: Every day | ORAL | Status: DC

## 2015-06-28 MED ORDER — MOMETASONE FURO-FORMOTEROL FUM 100-5 MCG/ACT IN AERO
2.0000 | INHALATION_SPRAY | Freq: Two times a day (BID) | RESPIRATORY_TRACT | Status: DC
  Administered 2015-06-28: 2 via RESPIRATORY_TRACT
  Filled 2015-06-28: qty 8.8

## 2015-06-28 MED ORDER — PREDNISONE 20 MG PO TABS: 40.0000 mg | ORAL_TABLET | Freq: Every day | ORAL | Status: DC

## 2015-06-28 MED ORDER — MOMETASONE FURO-FORMOTEROL FUM 100-5 MCG/ACT IN AERO: 2.0000 | INHALATION_SPRAY | Freq: Two times a day (BID) | RESPIRATORY_TRACT | Status: AC

## 2015-06-28 MED ORDER — PREDNISONE 10 MG PO TABS
40.0000 mg | ORAL_TABLET | Freq: Every day | ORAL | Status: DC
Start: 2015-06-29 — End: 2015-10-23

## 2015-06-28 MED ORDER — TIOTROPIUM BROMIDE MONOHYDRATE 18 MCG IN CAPS
18.0000 ug | ORAL_CAPSULE | Freq: Every day | RESPIRATORY_TRACT | Status: DC
  Administered 2015-06-28: 18 ug via RESPIRATORY_TRACT
  Filled 2015-06-28: qty 5

## 2015-06-28 NOTE — Discharge Instructions (Signed)
Use your oxygen as before Use your CPAP as nighttime

## 2015-06-28 NOTE — [place] FL2 (Addendum)
[place] LEVEL OF CARE SCREENING TOOL     IDENTIFICATION  Patient Name: Craig Wright Birthdate: Jan 04, 1966 Sex: male Admission Date (Current Location): 06/26/2015  [facility] and [place] Number:  [name]  ([id number] [facility]) Facility and Address:  [facility], [address] [place], [place] [address]      Provider Number: [id number]  Attending Physician Name and Address:  Fritzi Mandes, MD  Relative Name and Phone Number:       Current Level of Care: Domiciliary (Rest home) Recommended Level of Care: [place], [place] Prior Approval Number:    Date Approved/Denied:   PASRR Number:  ( [id number] T )  Discharge Plan: Domiciliary (Rest home)    Current Diagnoses: Patient Active Problem List   Diagnosis Date Noted  . Acute respiratory failure with hypercapnia ([place]) 06/26/2015  . COPD with acute exacerbation ([place]) 06/26/2015  . Dyspnea 06/07/2015    Orientation RESPIRATION BLADDER Height & Weight     Self, Time, Situation, Place  O2, Other (Comment) (Bi Pap 5 litres) Continent Weight: 207 lb 0.2 oz (93.9 kg) Height:  6' (182.9 cm)  BEHAVIORAL SYMPTOMS/MOOD NEUROLOGICAL BOWEL NUTRITION STATUS   (none )  (none ) Continent Diet (Carb Modified)  AMBULATORY STATUS COMMUNICATION OF NEEDS Skin   Independent Verbally Normal                       Personal Care Assistance Level of Assistance  Bathing, Feeding, Dressing Bathing Assistance: Independent Feeding assistance: Independent Dressing Assistance: Limited assistance     Functional Limitations Info  Sight, Hearing, Speech Sight Info: Adequate Hearing Info: Adequate Speech Info: Adequate    SPECIAL CARE FACTORS FREQUENCY                       Contractures      Additional Factors Info   (DNR) Code Status Info: DNR Allergies Info: none Psychotropic Info: none Insulin Sliding Scale Info:  (NovoLog Insulin Injections 3 times daily )        Current Medications (06/28/2015):  This is the current hospital active medication list  Discharge Medications: Shenae Bonanno Guadalupe Dawn, LCSW Social Worker Signed Clinical Social Work [place] FL2 06/28/2015 1:58 PM    Expand All Collapse All    Jud     IDENTIFICATION  Patient Name: Craig Wright Birthdate: 1965-08-26 Sex: male Admission Date (Current Location): 06/26/2015  [facility] and [place] Number:  [name]  ([id number]) Facility and Address:  [facility], [address], [place], [place] [address]     Provider Number: [id number]  Attending Physician Name and Address:  Fritzi Mandes, MD  Relative Name and Phone Number:       Current Level of Care: Domiciliary (Rest home) Recommended Level of Care: [place], [place] Prior Approval Number:    Date Approved/Denied:   PASRR Number:  ( [id number] T )  Discharge Plan: Domiciliary (Rest home)    Current Diagnoses: Patient Active Problem List   Diagnosis Date Noted  . Acute respiratory failure with hypercapnia ([place]) 06/26/2015  . COPD with acute exacerbation ([place]) 06/26/2015  . Dyspnea 06/07/2015    Orientation RESPIRATION BLADDER Height & Weight     Self, Time, Situation, Place  O2, Other (Comment) (Bi Pap 5 litres) Continent Weight: 207 lb 0.2 oz (93.9 kg) Height: 6' (182.9 cm)  BEHAVIORAL SYMPTOMS/MOOD NEUROLOGICAL BOWEL NUTRITION STATUS   (none )  (  none ) Continent Diet (Carb Modified)  AMBULATORY STATUS COMMUNICATION OF NEEDS Skin   Independent Verbally Normal                       Personal Care Assistance Level of Assistance  Bathing, Feeding, Dressing Bathing Assistance: Independent Feeding assistance: Independent Dressing Assistance: Limited assistance     Functional Limitations Info  Sight, Hearing, Speech Sight Info: Adequate Hearing  Info: Adequate Speech Info: Adequate    SPECIAL CARE FACTORS FREQUENCY                       Contractures      Additional Factors Info   (DNR) Code Status Info: DNR Allergies Info: none Psychotropic Info: none Insulin Sliding Scale Info: (NovoLog Insulin Injections 3 times daily )       Current Medications (06/28/2015): This is the current hospital active medication list Current Facility-Administered Medications  Medication Dose Route Frequency Provider Last Rate Last Dose  . acetaminophen (TYLENOL) tablet 650 mg 650 mg Oral Q6H PRN Saundra Shelling, MD     Or  . acetaminophen (TYLENOL) suppository 650 mg 650 mg Rectal Q6H PRN Pavan Pyreddy, MD    . albuterol (PROVENTIL) (2.5 MG/3ML) 0.083% nebulizer solution 2.5 mg 2.5 mg Nebulization Q2H PRN Pavan Pyreddy, MD    . budesonide (PULMICORT) nebulizer solution 0.5 mg 0.5 mg Nebulization BID Flora Lipps, MD  0.5 mg at 06/28/15 0833  . clonazePAM (KLONOPIN) tablet 1 mg 1 mg Oral BID Saundra Shelling, MD  1 mg at 06/28/15 0916  . docusate sodium (COLACE) capsule 100 mg 100 mg Oral BID Saundra Shelling, MD  100 mg at 06/28/15 0916  . DULoxetine (CYMBALTA) DR capsule 60 mg 60 mg Oral BID Saundra Shelling, MD  60 mg at 06/28/15 0916  . enoxaparin (LOVENOX) injection 40 mg 40 mg Subcutaneous Q24H Saundra Shelling, MD  40 mg at 06/26/15 2106  . insulin aspart (novoLOG) injection 0-15 Units 0-15 Units Subcutaneous TID WC Flora Lipps, MD  3 Units at 06/28/15 1236  . insulin aspart (novoLOG) injection 0-5 Units 0-5 Units Subcutaneous QHS Flora Lipps, MD  0 Units at 06/26/15 2117  . ipratropium-albuterol (DUONEB) 0.5-2.5 (3) MG/3ML nebulizer solution 3 mL 3 mL Nebulization Q4H PRN Fritzi Mandes, MD    . metFORMIN (GLUCOPHAGE-XR) 24 hr tablet 500 mg 500 mg Oral Q breakfast Saundra Shelling, MD  500 mg at 06/28/15  0805  . mometasone-formoterol (DULERA) 100-5 MCG/ACT inhaler 2 puff 2 puff Inhalation BID Flora Lipps, MD  2 puff at 06/28/15 1238  . ondansetron (ZOFRAN) tablet 4 mg 4 mg Oral Q6H PRN Saundra Shelling, MD     Or  . ondansetron (ZOFRAN) injection 4 mg 4 mg Intravenous Q6H PRN Pavan Pyreddy, MD    . oxyCODONE (Oxy IR/ROXICODONE) immediate release tablet 10 mg 10 mg Oral Q4H PRN Bettey Costa, MD  10 mg at 06/28/15 1236  . oxyCODONE (OXYCONTIN) 12 hr tablet 20 mg 20 mg Oral Q12H Bettey Costa, MD  20 mg at 06/28/15 0915  . [START ON 06/29/2015] predniSONE (DELTASONE) tablet 40 mg 40 mg Oral Q breakfast Flora Lipps, MD    . risperiDONE (RISPERDAL) tablet 2 mg 2 mg Oral QHS Saundra Shelling, MD  2 mg at 06/27/15 2115  . roflumilast (DALIRESP) tablet 500 mcg 500 mcg Oral Daily Saundra Shelling, MD  500 mcg at 06/28/15 0915  . sodium chloride flush (NS) 0.9 % injection 3 mL 3 mL  Intravenous Q12H Saundra Shelling, MD  3 mL at 06/28/15 0916  . tiotropium (SPIRIVA) inhalation capsule 18 mcg 18 mcg Inhalation Daily Flora Lipps, MD  18 mcg at 06/28/15 1239     Discharge Medications: Please see discharge summary for a list of discharge medications.  Relevant Imaging Results:  Relevant Lab Results:   Additional Information SSN [id number]  Enis Slipper [place], [place]          Cosigned by: Dustin Flock, MD at 06/28/2015 2:21 PM  Revision History     Date/Time User Provider Type Action   06/28/2015 2:21 PM Dustin Flock, MD Physician Cosign   06/28/2015 2:00 PM Orah Sonnen Guadalupe Dawn, LCSW Social Worker Sign   06/28/2015 1:59 PM Harshal Sirmon Guadalupe Dawn, LCSW Social Worker Sign   View Details Report              Please see discharge summary for a list of discharge medications.  Relevant Imaging Results:  Relevant Lab Results:   Additional Information SSN [id number]  Joana Reamer,  [place]

## 2015-06-28 NOTE — Progress Notes (Signed)
Pt discharged home in stable condition at this time. All belongings with pt. Pt discharged on home O2. Pt discharged in care of mother his POA. Discharge papers reviewed and signed with pt.   Pt escorted to door in wheelchair by [place], [place].

## 2015-06-28 NOTE — Discharge Summary (Addendum)
[facility] [place] NAME: Craig Wright    MR#:  [id number]  DATE OF BIRTH:  [birth year]  DATE OF ADMISSION:  06/26/2015 ADMITTING PHYSICIAN: Flora Lipps, MD  DATE OF DISCHARGE:06/28/15  PRIMARY CARE PHYSICIAN: No primary care provider on file.    ADMISSION DIAGNOSIS:  COPD exacerbation ([place]) [J44.1] Acute respiratory failure with hypoxia and hypercapnia (H[facility]) [J96.01, J96.02]  DISCHARGE DIAGNOSIS:  COPD exacerbation (HCC) [J44.1] Acute respiratory failure with hypoxia and hypercapnia  Chronic home oxygen Chronic narcotic dependence SECONDARY DIAGNOSIS:   Past Medical History  Diagnosis Date  . Diabetes mellitus without complication ([place])   . Asthma   . Hypertension   . COPD (chronic obstructive pulmonary disease) ([place])   . Anxiety disorder     HOSPITAL COURSE:  Craig Wright is a 50 y.o. male with a known history of COPD on oxygen via nasal cannula at home at 5 L experienced shortness of breath from 6 PM last night. He tried albuterol breathing treatments 4 times at home but did not help. Patient was given nebulization treatments en route to the hospital was put on nonrebreather initially. Was worked up in the emergency room with CO2 level was 100 use put on a BiPAP.  1.Acute on chronic respiratory failure secondary to end-stage COPD. -Patient improving slowly. Sats anywhere from 83-93%. Intermittent BiPAP. Mentating well. -Patient is chronically on oxygen 5 L nasal cannula continuous. -Appreciate pulmonary input -IV steroids---changed to by mouth taper -Patient remains afebrile. White count normal. He recently finished a course of antibiotic as outpatient. He received broad-spectrum for last couple days. No evidence of infection. We'll DC antibiotics.  2. Acute hypercapnic respiratory failure - patient's mentation improved  -Is chronically on 5 L nasal cannula oxygen.  3. Acute COPD exacerbation Treatment as  above  4. Hypertension Resume home meds  5. Anxiety disorder -Continue risperidone and Cymbalta  6. Chronic narcotic dependence. No changes have been made in pt's meds No new narcotic rx will be given  Seen by PT recommends HHPT [place] at baseline D/c home  CONSULTS OBTAINED:  Treatment Team:  Flora Lipps, MD  DRUG ALLERGIES:  No Known Allergies  DISCHARGE MEDICATIONS:   Current Discharge Medication List    START taking these medications   Details  mometasone-formoterol (DULERA) 100-5 MCG/ACT AERO Inhale 2 puffs into the lungs 2 (two) times daily. Qty: 1 Inhaler, Refills: 0    predniSONE (DELTASONE) 10 MG tablet Take 4 tablets (40 mg total) by mouth daily with breakfast. Taper by 10 mg daily then stop Qty: 10 tablet, Refills: 0      CONTINUE these medications which have NOT CHANGED   Details  albuterol (PROVENTIL HFA;VENTOLIN HFA) 108 (90 Base) MCG/ACT inhaler Inhale 2 puffs into the lungs every 6 (six) hours as needed for wheezing or shortness of breath.    albuterol-ipratropium (COMBIVENT) 18-103 MCG/ACT inhaler Inhale 2 puffs into the lungs every 4 (four) hours as needed for wheezing or shortness of breath.    clonazePAM (KLONOPIN) 1 MG tablet Take 1 mg by mouth 2 (two) times daily.    DULoxetine (CYMBALTA) 60 MG capsule Take 60 mg by mouth 2 (two) times daily.     furosemide (LASIX) 20 MG tablet Take 40 mg by mouth daily.     metFORMIN (GLUMETZA) 500 MG (MOD) 24 hr tablet Take 500 mg by mouth daily with breakfast. Reported on 06/26/2015    mirtazapine (REMERON) 45 MG tablet Take 45 mg by  mouth at bedtime.    oxycodone (OXY-IR) 5 MG capsule Take 10 mg by mouth every 6 (six) hours as needed.    oxyCODONE (OXYCONTIN) 20 mg 12 hr tablet Take 20 mg by mouth every 12 (twelve) hours.    risperiDONE (RISPERDAL) 2 MG tablet Take 2 mg by mouth at bedtime.    roflumilast (DALIRESP) 500 MCG TABS tablet Take 500 mcg by mouth daily.    tiotropium (SPIRIVA) 18 MCG  inhalation capsule Place 18 mcg into inhaler and inhale daily.        If you experience worsening of your admission symptoms, develop shortness of breath, life threatening emergency, suicidal or homicidal thoughts you must seek medical attention immediately by calling 911 or calling your MD immediately  if symptoms less severe.  You Must read complete instructions/literature along with all the possible adverse reactions/side effects for all the Medicines you take and that have been prescribed to you. Take any new Medicines after you have completely understood and accept all the possible adverse reactions/side effects.   Please note  You were cared for by a hospitalist during your hospital stay. If you have any questions about your discharge medications or the care you received while you were in the hospital after you are discharged, you can call the unit and asked to speak with the hospitalist on call if the hospitalist that took care of you is not available. Once you are discharged, your primary care physician will handle any further medical issues. Please note that NO REFILLS for any discharge medications will be authorized once you are discharged, as it is imperative that you return to your primary care physician (or establish a relationship with a primary care physician if you do not have one) for your aftercare needs so that they can reassess your need for medications and monitor your lab values.  DATA REVIEW:   CBC   Recent Labs Lab 06/27/15 0401  WBC 7.7  HGB 11.8*  HCT 35.5*  PLT 112*   Chemistries   Recent Labs Lab 06/26/15 0238  06/27/15 0401  NA 142  < > 137  K 3.6  < > 3.5  CL 93*  < > 94*  CO2 40*  < > 36*  GLUCOSE 205*  < > 131*  BUN 7  < > 10  CREATININE 0.58*  < > 0.48*  CALCIUM 9.5  < > 8.3*  AST 20  --   --   ALT 23  --   --   ALKPHOS 44  --   --   BILITOT 0.7  --   --   < > = values in this interval not displayed.  Microbiology Results   Recent  Results (from the past 240 hour(s))  CULTURE, BLOOD (ROUTINE X 2) w Reflex to PCR ID Panel     Status: None (Preliminary result)   Collection Time: 06/26/15  2:57 AM  Result Value Ref Range Status   Specimen Description BLOOD RIGHT ANTECUBITAL  Final   Special Requests BOTTLES DRAWN AEROBIC AND ANAEROBIC 5ML  Final   Culture NO GROWTH 2 DAYS  Final   Report Status PENDING  Incomplete  CULTURE, BLOOD (ROUTINE X 2) w Reflex to PCR ID Panel     Status: None (Preliminary result)   Collection Time: 06/26/15  2:59 AM  Result Value Ref Range Status   Specimen Description BLOOD RIGHT ANTECUBITAL  Final   Special Requests BOTTLES DRAWN AEROBIC AND ANAEROBIC 5ML  Final   Culture  NO GROWTH 2 DAYS  Final   Report Status PENDING  Incomplete  MRSA PCR Screening     Status: None   Collection Time: 06/26/15  6:04 AM  Result Value Ref Range Status   MRSA by PCR NEGATIVE NEGATIVE Final    Comment:        The GeneXpert MRSA Assay (FDA approved for NASAL specimens only), is one component of a comprehensive MRSA colonization surveillance program. It is not intended to diagnose MRSA infection nor to guide or monitor treatment for MRSA infections.     RADIOLOGY:  No results found.   Management plans discussed with the patient, family and they are in agreement.  CODE STATUS:     Code Status Orders        Start     Ordered   06/26/15 1014  Do not attempt resuscitation (DNR)   Continuous    Question Answer Comment  In the event of cardiac or respiratory ARREST Do not call a "code blue"   In the event of cardiac or respiratory ARREST Do not perform Intubation, CPR, defibrillation or ACLS   In the event of cardiac or respiratory ARREST Use medication by any route, position, wound care, and other measures to relive pain and suffering. May use oxygen, suction and manual treatment of airway obstruction as needed for comfort.      06/26/15 1013    Code Status History    Date Active Date  Inactive Code Status Order ID Comments User Context   06/26/2015  6:05 AM 06/26/2015 10:13 AM Full Code [id number]  Saundra Shelling, MD Inpatient   06/08/2015 12:15 AM 06/10/2015  4:32 PM Full Code [id number]  Saundra Shelling, MD Inpatient      TOTAL TIME TAKING CARE OF THIS PATIENT: 40 minutes.    Philicia Heyne M.D on 06/28/2015 at 2:13 PM  Between 7am to 6pm - Pager - [phone] After 6pm go to [email or web address] - password [email or web address] [facility]  [place] [facility]  Office  [phone]  CC: Primary care physician; No primary care provider on file.
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
## 2015-06-28 NOTE — Progress Notes (Signed)
Patient resting intermittantly overnight. Denies need for pain meds.   Approx. 12am patient RN to remove Bipap and states " Im going home, my car is in the parking lot".  Explained to patient that with his current health it would not be smart to leave the hospital. Patient agreed will continue to assess for changes/ need.

## 2015-06-28 NOTE — Care Management Note (Signed)
Case Management Note  Patient Details  Name: Craig Wright MRN: [id number] Date of Birth: 05/15/1965  Subjective/Objective:   Patient being discharged home with home health. Spoke with Craig Wright and he has no agency preference.   Referral to [place] for Sn. Patient can not have PT because he has Medicaid. Notified Debra at [place] [facility] [place].                Action/Plan: Sn with [facility]  Expected Discharge Date:   06/28/2015               Expected Discharge Plan:  [place]  In-House Referral:     Discharge planning Services  CM Consult  Post Acute Care Choice:  Home Health Choice offered to:  Patient  DME Arranged:    DME Agency:  [place] Arranged:  RN, PT [facility] Agency:     Status of Service:  Completed, signed off  Medicare Important Message Given:    Date Medicare IM Given:    Medicare IM give by:    Date Additional Medicare IM Given:    Additional Medicare Important Message give by:     If discussed at [place] of Stay Meetings, dates discussed:    Additional Comments:  Jolly Mango, RN 06/28/2015, 2:41 PM

## 2015-06-28 NOTE — Progress Notes (Signed)
Patient remains intubated on vent, rested comfortably on 55 mcg of propofol overnight. Insulin gtt continues, VSS see SCM for further assessment. Will continue to assess for changes/need.

## 2015-06-28 NOTE — Progress Notes (Addendum)
Chaplain rounded the unit and provided a compassionate presence and support to the patient. Chaplain Gredmarie Delange [phone] 

## 2015-06-28 NOTE — Progress Notes (Signed)
[place] at [place] NAME: Craig Wright    MR#:  [id number]  DATE OF BIRTH:  [birth year]  SUBJECTIVE:  Patient feels better today. He is wondering when he will go home.  Patient asymptomatic not in respiratory distress. Uses BiPAP at night.  REVIEW OF SYSTEMS:   Review of Systems  Constitutional: Negative for fever, chills and weight loss.  HENT: Negative for ear discharge, ear pain and nosebleeds.   Eyes: Negative for blurred vision, pain and discharge.  Respiratory: Negative for sputum production, shortness of breath, wheezing and stridor.   Cardiovascular: Negative for chest pain, palpitations, orthopnea and PND.  Gastrointestinal: Negative for nausea, vomiting, abdominal pain and diarrhea.  Genitourinary: Negative for urgency and frequency.  Musculoskeletal: Negative for back pain and joint pain.  Neurological: Negative for sensory change, speech change, focal weakness and weakness.  Psychiatric/Behavioral: Negative for depression and hallucinations. The patient is not nervous/anxious.   All other systems reviewed and are negative.  Tolerating Diet: Yes Tolerating PT: Pending  DRUG ALLERGIES:  No Known Allergies  VITALS:  Blood pressure 129/81, pulse 84, temperature 98.1 F (36.7 C), temperature source Axillary, resp. rate 18, height 6' (1.829 m), weight 93.9 kg (207 lb 0.2 oz), SpO2 99 %.  PHYSICAL EXAMINATION:   Physical Exam  GENERAL:  50 y.o.-year-old patient lying in the bed with no acute distress.  EYES: Pupils equal, round, reactive to light and accommodation. No scleral icterus. Extraocular muscles intact.  HEENT: Head atraumatic, normocephalic. Oropharynx and nasopharynx clear.  NECK:  Supple, no jugular venous distention. No thyroid enlargement, no tenderness.  LUNGS:distant breath sounds bilaterally, no wheezing, rales, rhonchi. No use of accessory muscles of respiration.  CARDIOVASCULAR: S1, S2 normal. No  murmurs, rubs, or gallops.  ABDOMEN: Soft, nontender, nondistended. Bowel sounds present. No organomegaly or mass.  EXTREMITIES: No cyanosis, clubbing or edema b/l.    NEUROLOGIC: Cranial nerves II through XII are intact. No focal Motor or sensory deficits b/l.   PSYCHIATRIC:  patient is alert and oriented x 3.  SKIN: No obvious rash, lesion, or ulcer.   LABORATORY PANEL:  CBC  Recent Labs Lab 06/27/15 0401  WBC 7.7  HGB 11.8*  HCT 35.5*  PLT 112*    Chemistries   Recent Labs Lab 06/26/15 0238  06/27/15 0401  NA 142  < > 137  K 3.6  < > 3.5  CL 93*  < > 94*  CO2 40*  < > 36*  GLUCOSE 205*  < > 131*  BUN 7  < > 10  CREATININE 0.58*  < > 0.48*  CALCIUM 9.5  < > 8.3*  AST 20  --   --   ALT 23  --   --   ALKPHOS 44  --   --   BILITOT 0.7  --   --   < > = values in this interval not displayed. Cardiac Enzymes  Recent Labs Lab 06/26/15 0238  TROPONINI <0.03   RADIOLOGY:  No results found. ASSESSMENT AND PLAN:  Craig Wright is a 50 y.o. male with a known history of COPD on oxygen via nasal cannula at home at 5 L experienced shortness of breath from 6 PM last night. He tried albuterol breathing treatments 4 times at home but did not help. Patient was given nebulization treatments en route to the hospital was put on nonrebreather initially. Was worked up in the emergency room with CO2 level was 100 use put on  a BiPAP.  1.Acute on chronic respiratory failure secondary to end-stage COPD. -Patient improving slowly. Sats anywhere from 83-93%. Intermittent BiPAP. Mentating well. -Patient is chronically on oxygen 5 L nasal cannula continuous. -Appreciate pulmonary input -IV steroids---changed to by mouth taper -Patient remains afebrile. White count normal. He recently finished a course of antibiotic as outpatient. He received broad-spectrum for last couple days. No evidence of infection. We'll DC antibiotics.  2. Acute hypercapnic respiratory failure - patient's  mentation improved  -Is chronically on 5 L nasal cannula oxygen.  3. Acute COPD exacerbation Treatment as above  4. Hypertension Resume home meds 5. Anxiety disorder -Continue risperidone and Cymbalta  Palliative care consultation pending We'll discuss discharge plan with care Management . Management plans discussed with the patient, family and they are in agreement.  CODE STATUS: DO NOT RESUSCITATE  DVT Prophylaxis: Lovenox  TOTAL TIME TAKING CARE OF THIS PATIENT: 35 minutes.  >50% time spent on counselling and coordination of care   Note: This dictation was prepared with Dragon dictation along with smaller phrase technology. Any transcriptional errors that result from this process are unintentional.  Craig Wright M.D on 06/28/2015 at 7:09 AM  Between 7am to 6pm - Pager - [phone]  After 6pm go to [email or web address] - password [email or web address]  [place] [facility]  Office  [phone]  CC: Primary care physician; No primary care provider on file.

## 2015-07-01 LAB — CULTURE, BLOOD (ROUTINE X 2)
CULTURE: NO GROWTH
Culture: NO GROWTH

## 2015-07-08 DIAGNOSIS — K5901 Slow transit constipation: Secondary | ICD-10-CM | POA: Insufficient documentation

## 2015-10-07 DIAGNOSIS — R748 Abnormal levels of other serum enzymes: Secondary | ICD-10-CM | POA: Insufficient documentation

## 2015-10-15 ENCOUNTER — Emergency Department: Payer: Medicaid Other

## 2015-10-15 ENCOUNTER — Inpatient Hospital Stay
Admission: EM | Admit: 2015-10-15 | Discharge: 2015-10-23 | DRG: 190 | Disposition: A | Discharge status: Home Health | Payer: Medicaid Other | Attending: Internal Medicine | Admitting: Internal Medicine

## 2015-10-15 ENCOUNTER — Encounter: Payer: Self-pay | Admitting: Emergency Medicine

## 2015-10-15 DIAGNOSIS — F17200 Nicotine dependence, unspecified, uncomplicated: Secondary | ICD-10-CM | POA: Diagnosis present

## 2015-10-15 DIAGNOSIS — J9622 Acute and chronic respiratory failure with hypercapnia: Secondary | ICD-10-CM | POA: Diagnosis not present

## 2015-10-15 DIAGNOSIS — Z515 Encounter for palliative care: Secondary | ICD-10-CM

## 2015-10-15 DIAGNOSIS — F419 Anxiety disorder, unspecified: Secondary | ICD-10-CM | POA: Diagnosis present

## 2015-10-15 DIAGNOSIS — Z9981 Dependence on supplemental oxygen: Secondary | ICD-10-CM

## 2015-10-15 DIAGNOSIS — R531 Weakness: Secondary | ICD-10-CM | POA: Diagnosis present

## 2015-10-15 DIAGNOSIS — J9611 Chronic respiratory failure with hypoxia: Secondary | ICD-10-CM | POA: Diagnosis present

## 2015-10-15 DIAGNOSIS — J440 Chronic obstructive pulmonary disease with acute lower respiratory infection: Secondary | ICD-10-CM | POA: Diagnosis present

## 2015-10-15 DIAGNOSIS — Z79899 Other long term (current) drug therapy: Secondary | ICD-10-CM | POA: Diagnosis not present

## 2015-10-15 DIAGNOSIS — G8929 Other chronic pain: Secondary | ICD-10-CM | POA: Diagnosis present

## 2015-10-15 DIAGNOSIS — Z7951 Long term (current) use of inhaled steroids: Secondary | ICD-10-CM

## 2015-10-15 DIAGNOSIS — Z66 Do not resuscitate: Secondary | ICD-10-CM | POA: Diagnosis present

## 2015-10-15 DIAGNOSIS — R000 Tachycardia, unspecified: Secondary | ICD-10-CM | POA: Diagnosis present

## 2015-10-15 DIAGNOSIS — J9621 Acute and chronic respiratory failure with hypoxia: Secondary | ICD-10-CM | POA: Diagnosis present

## 2015-10-15 DIAGNOSIS — I10 Essential (primary) hypertension: Secondary | ICD-10-CM | POA: Diagnosis present

## 2015-10-15 DIAGNOSIS — E871 Hypo-osmolality and hyponatremia: Secondary | ICD-10-CM | POA: Diagnosis present

## 2015-10-15 DIAGNOSIS — Z7984 Long term (current) use of oral hypoglycemic drugs: Secondary | ICD-10-CM

## 2015-10-15 DIAGNOSIS — R0600 Dyspnea, unspecified: Secondary | ICD-10-CM | POA: Diagnosis not present

## 2015-10-15 DIAGNOSIS — D696 Thrombocytopenia, unspecified: Secondary | ICD-10-CM | POA: Diagnosis present

## 2015-10-15 DIAGNOSIS — A419 Sepsis, unspecified organism: Secondary | ICD-10-CM

## 2015-10-15 DIAGNOSIS — R6510 Systemic inflammatory response syndrome (SIRS) of non-infectious origin without acute organ dysfunction: Secondary | ICD-10-CM | POA: Diagnosis present

## 2015-10-15 DIAGNOSIS — J811 Chronic pulmonary edema: Secondary | ICD-10-CM | POA: Diagnosis not present

## 2015-10-15 DIAGNOSIS — E876 Hypokalemia: Secondary | ICD-10-CM | POA: Diagnosis not present

## 2015-10-15 DIAGNOSIS — E119 Type 2 diabetes mellitus without complications: Secondary | ICD-10-CM | POA: Diagnosis present

## 2015-10-15 DIAGNOSIS — R410 Disorientation, unspecified: Secondary | ICD-10-CM | POA: Diagnosis present

## 2015-10-15 DIAGNOSIS — J209 Acute bronchitis, unspecified: Secondary | ICD-10-CM | POA: Diagnosis present

## 2015-10-15 DIAGNOSIS — R451 Restlessness and agitation: Secondary | ICD-10-CM | POA: Diagnosis present

## 2015-10-15 DIAGNOSIS — J441 Chronic obstructive pulmonary disease with (acute) exacerbation: Secondary | ICD-10-CM | POA: Diagnosis present

## 2015-10-15 DIAGNOSIS — F329 Major depressive disorder, single episode, unspecified: Secondary | ICD-10-CM | POA: Diagnosis present

## 2015-10-15 DIAGNOSIS — Z7952 Long term (current) use of systemic steroids: Secondary | ICD-10-CM | POA: Diagnosis not present

## 2015-10-15 DIAGNOSIS — J9690 Respiratory failure, unspecified, unspecified whether with hypoxia or hypercapnia: Secondary | ICD-10-CM

## 2015-10-15 DIAGNOSIS — Z79891 Long term (current) use of opiate analgesic: Secondary | ICD-10-CM

## 2015-10-15 DIAGNOSIS — J449 Chronic obstructive pulmonary disease, unspecified: Secondary | ICD-10-CM | POA: Diagnosis not present

## 2015-10-15 DIAGNOSIS — Z789 Other specified health status: Secondary | ICD-10-CM | POA: Diagnosis not present

## 2015-10-15 DIAGNOSIS — Z800 Family history of malignant neoplasm of digestive organs: Secondary | ICD-10-CM

## 2015-10-15 DIAGNOSIS — Z96642 Presence of left artificial hip joint: Secondary | ICD-10-CM | POA: Diagnosis present

## 2015-10-15 DIAGNOSIS — G4733 Obstructive sleep apnea (adult) (pediatric): Secondary | ICD-10-CM | POA: Diagnosis present

## 2015-10-15 DIAGNOSIS — Z8049 Family history of malignant neoplasm of other genital organs: Secondary | ICD-10-CM

## 2015-10-15 LAB — COMPREHENSIVE METABOLIC PANEL
ALBUMIN: 3.4 g/dL — AB (ref 3.5–5.0)
ALT: 195 U/L — ABNORMAL HIGH (ref 17–63)
AST: 89 U/L — AB (ref 15–41)
Alkaline Phosphatase: 53 U/L (ref 38–126)
Anion gap: 7 (ref 5–15)
BUN: 6 mg/dL (ref 6–20)
CALCIUM: 8.5 mg/dL — AB (ref 8.9–10.3)
CO2: 36 mmol/L — ABNORMAL HIGH (ref 22–32)
CREATININE: 0.62 mg/dL (ref 0.61–1.24)
Chloride: 90 mmol/L — ABNORMAL LOW (ref 101–111)
GFR calc Af Amer: >60 mL/min (ref >60)
Glucose, Bld: 173 mg/dL — ABNORMAL HIGH (ref 65–99)
Potassium: 3.4 mmol/L — ABNORMAL LOW (ref 3.5–5.1)
SODIUM: 133 mmol/L — AB (ref 135–145)
TOTAL PROTEIN: 6.9 g/dL (ref 6.5–8.1)
Total Bilirubin: 0.5 mg/dL (ref 0.3–1.2)

## 2015-10-15 LAB — URINALYSIS COMPLETE WITH MICROSCOPIC (ARMC ONLY)
BACTERIA UA: NONE SEEN
BILIRUBIN URINE: NEGATIVE
Glucose, UA: NEGATIVE mg/dL
Hgb urine dipstick: NEGATIVE
Ketones, ur: NEGATIVE mg/dL
LEUKOCYTES UA: NEGATIVE
Nitrite: NEGATIVE
PH: 5 (ref 5.0–8.0)
PROTEIN: NEGATIVE mg/dL
Specific Gravity, Urine: 1.009 (ref 1.005–1.030)

## 2015-10-15 LAB — CBC WITH DIFFERENTIAL/PLATELET
BASOS PCT: 1 %
Basophils Absolute: 0 10*3/uL (ref 0–0.1)
EOS ABS: 0 10*3/uL (ref 0–0.7)
Eosinophils Relative: 1 %
HEMATOCRIT: 44.1 % (ref 40.0–52.0)
Hemoglobin: 14.9 g/dL (ref 13.0–18.0)
Lymphocytes Relative: 14 %
Lymphs Abs: 0.6 10*3/uL — ABNORMAL LOW (ref 1.0–3.6)
MCH: 29.8 pg (ref 26.0–34.0)
MCHC: 33.7 g/dL (ref 32.0–36.0)
MCV: 88.3 fL (ref 80.0–100.0)
MONO ABS: 0.9 10*3/uL (ref 0.2–1.0)
MONOS PCT: 20 %
Neutro Abs: 3 10*3/uL (ref 1.4–6.5)
Neutrophils Relative %: 64 %
Platelets: 152 10*3/uL (ref 150–440)
RBC: 4.99 MIL/uL (ref 4.40–5.90)
RDW: 14.2 % (ref 11.5–14.5)
WBC: 4.6 10*3/uL (ref 3.8–10.6)

## 2015-10-15 LAB — TROPONIN I: Troponin I: <0.03 ng/mL (ref <0.03)

## 2015-10-15 LAB — APTT: aPTT: 33 seconds (ref 24–36)

## 2015-10-15 LAB — PROTIME-INR
INR: 1.01
PROTHROMBIN TIME: 13.3 s (ref 11.4–15.2)

## 2015-10-15 LAB — LIPASE, BLOOD: LIPASE: 16 U/L (ref 11–51)

## 2015-10-15 LAB — LACTIC ACID, PLASMA
LACTIC ACID, VENOUS: 1.5 mmol/L (ref 0.5–1.9)
LACTIC ACID, VENOUS: 1.2 mmol/L (ref 0.5–1.9)

## 2015-10-15 LAB — GLUCOSE, CAPILLARY: Glucose-Capillary: 275 mg/dL — ABNORMAL HIGH (ref 65–99)

## 2015-10-15 LAB — MRSA PCR SCREENING: MRSA by PCR: NEGATIVE

## 2015-10-15 MED ORDER — MIRTAZAPINE 15 MG PO TABS
45.0000 mg | ORAL_TABLET | Freq: Every day | ORAL | Status: DC
  Administered 2015-10-15 – 2015-10-22 (×8): 45 mg via ORAL
  Filled 2015-10-15 (×8): qty 3

## 2015-10-15 MED ORDER — OXYCODONE HCL ER 10 MG PO T12A
20.0000 mg | EXTENDED_RELEASE_TABLET | Freq: Two times a day (BID) | ORAL | Status: DC
Start: 2015-10-15 — End: 2015-10-23
  Administered 2015-10-16 – 2015-10-23 (×15): 20 mg via ORAL
  Filled 2015-10-15 (×2): qty 1
  Filled 2015-10-15 (×3): qty 2
  Filled 2015-10-15: qty 1
  Filled 2015-10-15 (×3): qty 2
  Filled 2015-10-15: qty 1
  Filled 2015-10-15: qty 2
  Filled 2015-10-15 (×2): qty 1
  Filled 2015-10-15 (×3): qty 2
  Filled 2015-10-15: qty 1

## 2015-10-15 MED ORDER — VANCOMYCIN HCL 10 G IV SOLR
1250.0000 mg | Freq: Three times a day (TID) | INTRAVENOUS | Status: DC
  Administered 2015-10-15 – 2015-10-16 (×2): 1250 mg via INTRAVENOUS
  Filled 2015-10-15 (×5): qty 1250

## 2015-10-15 MED ORDER — ROFLUMILAST 500 MCG PO TABS
500.0000 ug | ORAL_TABLET | Freq: Every day | ORAL | Status: DC
  Administered 2015-10-15 – 2015-10-23 (×9): 500 ug via ORAL
  Filled 2015-10-15 (×11): qty 1

## 2015-10-15 MED ORDER — PIPERACILLIN-TAZOBACTAM 3.375 G IVPB: 3.3750 g | Freq: Three times a day (TID) | INTRAVENOUS | Status: DC

## 2015-10-15 MED ORDER — PIPERACILLIN-TAZOBACTAM 3.375 G IVPB
3.3750 g | Freq: Three times a day (TID) | INTRAVENOUS | Status: DC
  Administered 2015-10-15 – 2015-10-16 (×2): 3.375 g via INTRAVENOUS
  Filled 2015-10-15 (×4): qty 50

## 2015-10-15 MED ORDER — OXYCODONE HCL 5 MG PO TABS
5.0000 mg | ORAL_TABLET | ORAL | Status: DC | PRN
  Administered 2015-10-15 – 2015-10-20 (×7): 5 mg via ORAL
  Filled 2015-10-15 (×6): qty 1
  Filled 2015-10-15: qty 2
  Filled 2015-10-15 (×3): qty 1

## 2015-10-15 MED ORDER — TIOTROPIUM BROMIDE MONOHYDRATE 18 MCG IN CAPS
18.0000 ug | ORAL_CAPSULE | Freq: Every day | RESPIRATORY_TRACT | Status: DC
  Administered 2015-10-15 – 2015-10-21 (×7): 18 ug via RESPIRATORY_TRACT
  Filled 2015-10-15 (×3): qty 5

## 2015-10-15 MED ORDER — RISPERIDONE 1 MG PO TABS
2.0000 mg | ORAL_TABLET | Freq: Every day | ORAL | Status: DC
  Administered 2015-10-15 – 2015-10-22 (×8): 2 mg via ORAL
  Filled 2015-10-15: qty 1
  Filled 2015-10-15: qty 4
  Filled 2015-10-15 (×2): qty 2
  Filled 2015-10-15 (×2): qty 1
  Filled 2015-10-15: qty 4
  Filled 2015-10-15: qty 2

## 2015-10-15 MED ORDER — DULOXETINE HCL 60 MG PO CPEP
60.0000 mg | ORAL_CAPSULE | Freq: Two times a day (BID) | ORAL | Status: DC
  Administered 2015-10-15 – 2015-10-23 (×16): 60 mg via ORAL
  Filled 2015-10-15: qty 1
  Filled 2015-10-15 (×3): qty 2
  Filled 2015-10-15: qty 1
  Filled 2015-10-15: qty 2
  Filled 2015-10-15: qty 1
  Filled 2015-10-15: qty 2
  Filled 2015-10-15 (×4): qty 1
  Filled 2015-10-15: qty 2
  Filled 2015-10-15 (×3): qty 1

## 2015-10-15 MED ORDER — SODIUM CHLORIDE 0.9% FLUSH
3.0000 mL | Freq: Two times a day (BID) | INTRAVENOUS | Status: DC
  Administered 2015-10-19 – 2015-10-23 (×9): 3 mL via INTRAVENOUS

## 2015-10-15 MED ORDER — IPRATROPIUM-ALBUTEROL 0.5-2.5 (3) MG/3ML IN SOLN
3.0000 mL | Freq: Once | RESPIRATORY_TRACT | Status: AC
  Administered 2015-10-15: 3 mL via RESPIRATORY_TRACT
  Filled 2015-10-15: qty 3

## 2015-10-15 MED ORDER — CLONAZEPAM 1 MG PO TABS
1.0000 mg | ORAL_TABLET | Freq: Two times a day (BID) | ORAL | Status: DC
  Administered 2015-10-16 – 2015-10-20 (×8): 1 mg via ORAL
  Filled 2015-10-15: qty 2
  Filled 2015-10-15 (×7): qty 1
  Filled 2015-10-15: qty 2
  Filled 2015-10-15: qty 1

## 2015-10-15 MED ORDER — KETOROLAC TROMETHAMINE 30 MG/ML IJ SOLN
15.0000 mg | Freq: Once | INTRAMUSCULAR | Status: AC
  Administered 2015-10-15: 15 mg via INTRAVENOUS

## 2015-10-15 MED ORDER — SODIUM CHLORIDE 0.9 % IV BOLUS (SEPSIS)
1000.0000 mL | Freq: Once | INTRAVENOUS | Status: AC
  Administered 2015-10-15: 1000 mL via INTRAVENOUS

## 2015-10-15 MED ORDER — ACETAMINOPHEN 325 MG PO TABS
650.0000 mg | ORAL_TABLET | Freq: Four times a day (QID) | ORAL | Status: DC | PRN
  Administered 2015-10-17 – 2015-10-18 (×3): 650 mg via ORAL
  Filled 2015-10-15 (×3): qty 2

## 2015-10-15 MED ORDER — ENOXAPARIN SODIUM 40 MG/0.4ML ~~LOC~~ SOLN
40.0000 mg | SUBCUTANEOUS | Status: DC
Start: 2015-10-15 — End: 2015-10-23
  Administered 2015-10-19 – 2015-10-22 (×4): 40 mg via SUBCUTANEOUS
  Filled 2015-10-15 (×7): qty 0.4

## 2015-10-15 MED ORDER — METHYLPREDNISOLONE SODIUM SUCC 125 MG IJ SOLR
60.0000 mg | INTRAMUSCULAR | Status: DC
Start: 2015-10-15 — End: 2015-10-15
  Filled 2015-10-15: qty 2

## 2015-10-15 MED ORDER — ENOXAPARIN SODIUM 40 MG/0.4ML ~~LOC~~ SOLN: 40.0000 mg | SUBCUTANEOUS | Status: DC

## 2015-10-15 MED ORDER — VANCOMYCIN HCL IN DEXTROSE 1-5 GM/200ML-% IV SOLN
1000.0000 mg | Freq: Once | INTRAVENOUS | Status: AC
Start: 2015-10-15 — End: 2015-10-15
  Administered 2015-10-15: 1000 mg via INTRAVENOUS
  Filled 2015-10-15: qty 200

## 2015-10-15 MED ORDER — PIPERACILLIN-TAZOBACTAM 3.375 G IVPB 30 MIN
3.3750 g | Freq: Once | INTRAVENOUS | Status: AC
  Administered 2015-10-15: 3.375 g via INTRAVENOUS
  Filled 2015-10-15: qty 50

## 2015-10-15 MED ORDER — METHYLPREDNISOLONE SODIUM SUCC 125 MG IJ SOLR
60.0000 mg | INTRAMUSCULAR | Status: DC
  Administered 2015-10-16: 60 mg via INTRAVENOUS
  Filled 2015-10-15: qty 2

## 2015-10-15 MED ORDER — MOMETASONE FURO-FORMOTEROL FUM 100-5 MCG/ACT IN AERO
2.0000 | INHALATION_SPRAY | Freq: Two times a day (BID) | RESPIRATORY_TRACT | Status: DC
  Administered 2015-10-15 – 2015-10-16 (×2): 2 via RESPIRATORY_TRACT
  Filled 2015-10-15: qty 8.8

## 2015-10-15 MED ORDER — ENOXAPARIN SODIUM 40 MG/0.4ML ~~LOC~~ SOLN
SUBCUTANEOUS | Status: AC
  Filled 2015-10-15: qty 0.4

## 2015-10-15 MED ORDER — POTASSIUM CHLORIDE CRYS ER 20 MEQ PO TBCR
40.0000 meq | EXTENDED_RELEASE_TABLET | Freq: Once | ORAL | Status: AC
  Administered 2015-10-15: 40 meq via ORAL

## 2015-10-15 MED ORDER — ALBUTEROL SULFATE (2.5 MG/3ML) 0.083% IN NEBU
5.0000 mg | INHALATION_SOLUTION | Freq: Once | RESPIRATORY_TRACT | Status: AC
  Administered 2015-10-15: 5 mg via RESPIRATORY_TRACT
  Filled 2015-10-15: qty 6

## 2015-10-15 MED ORDER — POTASSIUM CHLORIDE CRYS ER 20 MEQ PO TBCR
EXTENDED_RELEASE_TABLET | ORAL | Status: AC
  Administered 2015-10-15: 40 meq via ORAL
  Filled 2015-10-15: qty 2

## 2015-10-15 MED ORDER — INSULIN ASPART 100 UNIT/ML ~~LOC~~ SOLN
0.0000 [IU] | Freq: Every day | SUBCUTANEOUS | Status: DC
Start: 2015-10-15 — End: 2015-10-23
  Administered 2015-10-15: 3 [IU] via SUBCUTANEOUS
  Administered 2015-10-16 – 2015-10-22 (×5): 2 [IU] via SUBCUTANEOUS
  Filled 2015-10-15 (×3): qty 2
  Filled 2015-10-15: qty 3
  Filled 2015-10-15 (×2): qty 2

## 2015-10-15 MED ORDER — ONDANSETRON HCL 4 MG/2ML IJ SOLN: 4.0000 mg | Freq: Four times a day (QID) | INTRAMUSCULAR | Status: DC | PRN

## 2015-10-15 MED ORDER — IPRATROPIUM-ALBUTEROL 0.5-2.5 (3) MG/3ML IN SOLN
3.0000 mL | RESPIRATORY_TRACT | Status: DC
  Administered 2015-10-15 – 2015-10-21 (×35): 3 mL via RESPIRATORY_TRACT
  Filled 2015-10-15 (×33): qty 3

## 2015-10-15 MED ORDER — ONDANSETRON HCL 4 MG PO TABS: 4.0000 mg | ORAL_TABLET | Freq: Four times a day (QID) | ORAL | Status: DC | PRN

## 2015-10-15 MED ORDER — METHYLPREDNISOLONE SODIUM SUCC 125 MG IJ SOLR
125.0000 mg | Freq: Once | INTRAMUSCULAR | Status: AC
  Administered 2015-10-15: 125 mg via INTRAVENOUS
  Filled 2015-10-15: qty 2

## 2015-10-15 MED ORDER — INSULIN ASPART 100 UNIT/ML ~~LOC~~ SOLN
0.0000 [IU] | Freq: Three times a day (TID) | SUBCUTANEOUS | Status: DC
  Administered 2015-10-16: 12:00:00 2 [IU] via SUBCUTANEOUS
  Administered 2015-10-16: 09:00:00 1 [IU] via SUBCUTANEOUS
  Administered 2015-10-16: 18:00:00 2 [IU] via SUBCUTANEOUS
  Administered 2015-10-17: 5 [IU] via SUBCUTANEOUS
  Administered 2015-10-17 – 2015-10-18 (×2): 2 [IU] via SUBCUTANEOUS
  Administered 2015-10-18: 3 [IU] via SUBCUTANEOUS
  Administered 2015-10-18: 2 [IU] via SUBCUTANEOUS
  Administered 2015-10-19: 3 [IU] via SUBCUTANEOUS
  Administered 2015-10-19 – 2015-10-20 (×3): 2 [IU] via SUBCUTANEOUS
  Administered 2015-10-20: 5 [IU] via SUBCUTANEOUS
  Filled 2015-10-15 (×3): qty 2
  Filled 2015-10-15: qty 3
  Filled 2015-10-15: qty 2
  Filled 2015-10-15: qty 3
  Filled 2015-10-15: qty 5
  Filled 2015-10-15: qty 1
  Filled 2015-10-15 (×3): qty 2
  Filled 2015-10-15: qty 5
  Filled 2015-10-15: qty 2

## 2015-10-15 MED ORDER — ACETAMINOPHEN 650 MG RE SUPP: 650.0000 mg | Freq: Four times a day (QID) | RECTAL | Status: DC | PRN

## 2015-10-15 MED ORDER — KETOROLAC TROMETHAMINE 30 MG/ML IJ SOLN
INTRAMUSCULAR | Status: AC
  Administered 2015-10-15: 15 mg via INTRAVENOUS
  Filled 2015-10-15: qty 1

## 2015-10-15 MED ORDER — SODIUM CHLORIDE 0.9 % IV SOLN
INTRAVENOUS | Status: DC
  Administered 2015-10-15 – 2015-10-17 (×4): via INTRAVENOUS

## 2015-10-15 MED ORDER — OXYCODONE HCL 5 MG PO TABS
10.0000 mg | ORAL_TABLET | ORAL | Status: DC | PRN
  Administered 2015-10-17 – 2015-10-23 (×13): 10 mg via ORAL
  Filled 2015-10-15 (×3): qty 2
  Filled 2015-10-15: qty 1
  Filled 2015-10-15 (×6): qty 2
  Filled 2015-10-15: qty 1
  Filled 2015-10-15 (×4): qty 2

## 2015-10-15 NOTE — ED Notes (Signed)
MD at bedside. 

## 2015-10-15 NOTE — H&P (Signed)
[place] at [place] NAME: Craig Wright    MR#:  [id number]  DATE OF BIRTH:  1965/09/05   DATE OF ADMISSION:  10/15/2015  PRIMARY CARE PHYSICIAN: No PCP Per Patient   REQUESTING/REFERRING PHYSICIAN: Edd Fabian  CHIEF COMPLAINT:   Chief Complaint  Patient presents with  . Fever  Shortness of breath  HISTORY OF PRESENT ILLNESS:  Craig Wright  is a 50 y.o. male with a known history of COPD, chronic respiratory failure on 4-5 L nasal cannula Baseline presenting with fevers chills shortness of breath. Patient states 3 days duration of symptoms including cough nonproductive, subjective fevers chills, continue shortness of breath at rest as well as with exertion denies any chest pain.  PAST MEDICAL HISTORY:   Past Medical History:  Diagnosis Date  . Anxiety disorder   . Asthma   . COPD (chronic obstructive pulmonary disease) ([place])   . Diabetes mellitus without complication ([place])   . Hypertension     PAST SURGICAL HISTORY:   Past Surgical History:  Procedure Laterality Date  . KNEE SURGERY Left   . ORTHOPEDIC SURGERY     multiple  . SKIN GRAFT    . TOTAL HIP ARTHROPLASTY Left     SOCIAL HISTORY:   Social History  Substance Use Topics  . Smoking status: Current Some Day Smoker  . Smokeless tobacco: Never Used  . Alcohol use 0.0 oz/week    FAMILY HISTORY:   Family History  Problem Relation Age of Onset  . Cervical cancer Mother   . Colon cancer Father     DRUG ALLERGIES:  No Known Allergies  REVIEW OF SYSTEMS:  REVIEW OF SYSTEMS:  CONSTITUTIONAL:Positives, chills, fatigue, weakness.  EYES: Denies blurred vision, double vision, or eye pain.  EARS, NOSE, THROAT: Denies tinnitus, ear pain, hearing loss.  RESPIRATORY:Positiveh, shortness of breath, wheezing  CARDIOVASCULAR: Denies chest pain, palpitations, edema.  GASTROINTESTINAL: Denies nausea, vomiting, diarrhea, abdominal pain.  GENITOURINARY: Denies dysuria,  hematuria.  ENDOCRINE: Denies nocturia or thyroid problems. HEMATOLOGIC AND LYMPHATIC: Denies easy bruising or bleeding.  SKIN: Denies rash or lesions.  MUSCULOSKELETAL: Denies pain in neck, back, shoulder, knees, hips, or further arthritic symptoms.  NEUROLOGIC: Denies paralysis, paresthesias.  PSYCHIATRIC: Denies anxiety or depressive symptoms. Otherwise full review of systems performed by me is negative.   MEDICATIONS AT HOME:   Prior to Admission medications   Medication Sig Start Date End Date Taking? Authorizing Provider  albuterol-ipratropium (COMBIVENT) 18-103 MCG/ACT inhaler Inhale 2 puffs into the lungs every 4 (four) hours as needed for wheezing or shortness of breath.   Yes Historical Provider, MD  clonazePAM (KLONOPIN) 1 MG tablet Take 1 mg by mouth 2 (two) times daily.   Yes Historical Provider, MD  DULoxetine (CYMBALTA) 60 MG capsule Take 60 mg by mouth 2 (two) times daily.    Yes Historical Provider, MD  furosemide (LASIX) 20 MG tablet Take 40 mg by mouth daily.    Yes Historical Provider, MD  metFORMIN (GLUMETZA) 500 MG (MOD) 24 hr tablet Take 500 mg by mouth daily with breakfast. Reported on 06/26/2015   Yes Historical Provider, MD  mirtazapine (REMERON) 45 MG tablet Take 45 mg by mouth at bedtime.   Yes Historical Provider, MD  oxyCODONE (OXYCONTIN) 20 mg 12 hr tablet Take 20 mg by mouth every 12 (twelve) hours.   Yes Historical Provider, MD  Oxycodone HCl 10 MG TABS Take 10 mg by mouth every 6 (six) hours as needed.  Yes Historical Provider, MD  risperiDONE (RISPERDAL) 2 MG tablet Take 2 mg by mouth at bedtime.   Yes Historical Provider, MD  roflumilast (DALIRESP) 500 MCG TABS tablet Take 500 mcg by mouth daily.   Yes Historical Provider, MD  tiotropium (SPIRIVA) 18 MCG inhalation capsule Place 18 mcg into inhaler and inhale daily.   Yes Historical Provider, MD  albuterol (PROVENTIL HFA;VENTOLIN HFA) 108 (90 Base) MCG/ACT inhaler Inhale 2 puffs into the lungs every 6 (six)  hours as needed for wheezing or shortness of breath.    Historical Provider, MD  mometasone-formoterol (DULERA) 100-5 MCG/ACT AERO Inhale 2 puffs into the lungs 2 (two) times daily. Patient not taking: Reported on 10/15/2015 06/28/15   Fritzi Mandes, MD  predniSONE (DELTASONE) 10 MG tablet Take 4 tablets (40 mg total) by mouth daily with breakfast. Taper by 10 mg daily then stop 06/29/15   Fritzi Mandes, MD      VITAL SIGNS:  Blood pressure 135/90, pulse (!) 128, temperature 98.7 F (37.1 C), temperature source Oral, resp. rate (!) 22, height 6' (1.829 m), weight 87.5 kg (193 lb), SpO2 94 %.  PHYSICAL EXAMINATION:  VITAL SIGNS: Vitals:   10/15/15 1347  BP: 135/90  Pulse: (!) 128  Resp: (!) 22  Temp: 98.7 F (37.1 C)   GENERAL:49 y.o.male currently inMinimal respiratory distress  HEAD: Normocephalic, atraumatic.  EYES: Pupils equal, round, reactive to light. Extraocular muscles intact. No scleral icterus.  MOUTH: Moist mucosal membrane. Dentition intact. No abscess noted.  EAR, NOSE, THROAT: Clear without exudates. No external lesions.  NECK: Supple. No thyromegaly. No nodules. No JVD.  PULMONARY:Diffuse expiratory wheeze without  rhonci.  tachypnea No use of accessory muscles, Good respiratory effort. good air entry bilaterally CHEST: Nontender to palpation.  CARDIOVASCULAR: S1 and S2.Tachycardic  No murmurs, rubs, or gallops. No edema. Pedal pulses 2+ bilaterally.  GASTROINTESTINAL: Soft, nontender, nondistended. No masses. Positive bowel sounds. No hepatosplenomegaly.  MUSCULOSKELETAL: No swelling, clubbing, or edema. Range of motion full in all extremities.  NEUROLOGIC: Cranial nerves II through XII are intact. No gross focal neurological deficits. Sensation intact. Reflexes intact.  SKIN: No ulceration, lesions, rashes, or cyanosis. Skin hot to touch dry. Turgor intact.  PSYCHIATRIC: Mood, affect within normal limits. The patient is awake, alert and oriented x 3. Insight, judgment intact.      LABORATORY PANEL:   CBC  Recent Labs Lab 10/15/15 1353  WBC 4.6  HGB 14.9  HCT 44.1  PLT 152   ------------------------------------------------------------------------------------------------------------------  Chemistries   Recent Labs Lab 10/15/15 1353  NA 133*  K 3.4*  CL 90*  CO2 36*  GLUCOSE 173*  BUN 6  CREATININE 0.62  CALCIUM 8.5*  AST 89*  ALT 195*  ALKPHOS 53  BILITOT 0.5   ------------------------------------------------------------------------------------------------------------------  Cardiac Enzymes  Recent Labs Lab 10/15/15 1353  TROPONINI <0.03   ------------------------------------------------------------------------------------------------------------------  RADIOLOGY:  Dg Chest Port 1 View  Result Date: 10/15/2015 CLINICAL DATA:  Productive cough, fever, shortness of breath. EXAM: PORTABLE CHEST 1 VIEW COMPARISON:  Radiograph Jun 26, 2015. FINDINGS: The heart size and mediastinal contours are within normal limits. No pneumothorax or pleural effusion is noted. Stable scarring is noted in both lung bases. No acute pulmonary disease is noted. The visualized skeletal structures are unremarkable. IMPRESSION: No acute cardiopulmonary abnormality seen. Electronically Signed   By: Marijo Conception, M.D.   On: 10/15/2015 15:33    EKG:   Orders placed or performed during the hospital encounter of 10/15/15  .  EKG 12-Lead  . EKG 12-Lead  . EKG 12-Lead  . EKG 12-Lead    IMPRESSION AND PLAN:   50 year old Caucasian gentleman history of COPD chronic respiratory failure 5 L nasal candidate Baseline presenting with shortness of breath fever  1.Sepsis, meeting septic criteria by temperature, tachycardia, respiratory rate present on arrival. Source likely pneumonia. Panculture. Broad-spectrum antibiotics including vancomycin/Zosyn and taper antibiotics when culture data returns.   Continue IV fluid hydration to keep mean arterial pressure greater than  65. may require pressor therapy if blood pressure worsens. We will repeat lactic acid if the initial is greater than 2.2.   2. Acute on chronic respiratory failure with hypoxia: Secondary to above: Continue oxygen and breathing treatments steroids 3. Type 2 diabetes non-insulin-requiring: At insulin sliding-scale hold oral agents 4. Hyponatremia: Mild, IV fluid hydration 5. Hypokalemia: Replace goal 4-5    All the records are reviewed and case discussed with ED provider. Management plans discussed with the patient, family and they are in agreement.  CODE STATUS: DO NOT RESUSCITATE  TOTAL TIME TAKING CARE OF THIS PATIENT: 40 minutes.    Luay Balding,  Karenann Cai.D on 10/15/2015 at 5:00 PM  Between 7am to 6pm - Pager - [phone]  After 6pm: House Pager: - [phone]  [facility]  Office  [phone]  CC: Primary care physician; No PCP Per Patient

## 2015-10-15 NOTE — ED Triage Notes (Signed)
Pt comes into the ED via EMS from [place] c/o fever.  Patient at 102 oral with HR at 128.  Patient presents with productive cough h/o COPD.  Patient states he had a dizzy spell last night but none since then.  Patient has labored breathing and he states he has more shortness of breath.

## 2015-10-15 NOTE — ED Provider Notes (Signed)
[facility] Emergency Department Provider Note  ____________________________________________  Time seen: Approximately 1:55 PM  I have reviewed the triage vital signs and the nursing notes.   HISTORY  Chief Complaint Shortness of breath   HPI Craig Wright is a 50 y.o. male with COPD on 5 L nasal cannula oxygen at all times presents with worsening shortness of breath since yesterday. Also associated with fevers and chills and sweats. No chest pain. No abdominal pain vomiting or diarrhea. He is having a productive cough.  Temperature is reported to be 102 at his care home.   Past Medical History:  Diagnosis Date  . Anxiety disorder   . Asthma   . COPD (chronic obstructive pulmonary disease) ([place])   . Diabetes mellitus without complication ([place])   . Hypertension      Patient Active Problem List   Diagnosis Date Noted  . Acute respiratory failure with hypercapnia ([place]) 06/26/2015  . COPD with acute exacerbation ([place]) 06/26/2015  . Dyspnea 06/07/2015     Past Surgical History:  Procedure Laterality Date  . KNEE SURGERY Left   . ORTHOPEDIC SURGERY     multiple  . SKIN GRAFT    . TOTAL HIP ARTHROPLASTY Left      Prior to Admission medications   Medication Sig Start Date End Date Taking? Authorizing Provider  albuterol (PROVENTIL HFA;VENTOLIN HFA) 108 (90 Base) MCG/ACT inhaler Inhale 2 puffs into the lungs every 6 (six) hours as needed for wheezing or shortness of breath.    Historical Provider, MD  albuterol-ipratropium (COMBIVENT) 18-103 MCG/ACT inhaler Inhale 2 puffs into the lungs every 4 (four) hours as needed for wheezing or shortness of breath.    Historical Provider, MD  clonazePAM (KLONOPIN) 1 MG tablet Take 1 mg by mouth 2 (two) times daily.    Historical Provider, MD  DULoxetine (CYMBALTA) 60 MG capsule Take 60 mg by mouth 2 (two) times daily.     Historical Provider, MD  furosemide (LASIX) 20 MG tablet Take 40 mg by mouth daily.      Historical Provider, MD  metFORMIN (GLUMETZA) 500 MG (MOD) 24 hr tablet Take 500 mg by mouth daily with breakfast. Reported on 06/26/2015    Historical Provider, MD  mirtazapine (REMERON) 45 MG tablet Take 45 mg by mouth at bedtime.    Historical Provider, MD  mometasone-formoterol (DULERA) 100-5 MCG/ACT AERO Inhale 2 puffs into the lungs 2 (two) times daily. 06/28/15   Fritzi Mandes, MD  oxycodone (OXY-IR) 5 MG capsule Take 10 mg by mouth every 6 (six) hours as needed.    Historical Provider, MD  oxyCODONE (OXYCONTIN) 20 mg 12 hr tablet Take 20 mg by mouth every 12 (twelve) hours.    Historical Provider, MD  predniSONE (DELTASONE) 10 MG tablet Take 4 tablets (40 mg total) by mouth daily with breakfast. Taper by 10 mg daily then stop 06/29/15   Fritzi Mandes, MD  risperiDONE (RISPERDAL) 2 MG tablet Take 2 mg by mouth at bedtime.    Historical Provider, MD  roflumilast (DALIRESP) 500 MCG TABS tablet Take 500 mcg by mouth daily.    Historical Provider, MD  tiotropium (SPIRIVA) 18 MCG inhalation capsule Place 18 mcg into inhaler and inhale daily.    Historical Provider, MD     Allergies Review of patient's allergies indicates no known allergies.   Family History  Problem Relation Age of Onset  . Cervical cancer Mother   . Colon cancer Father     Social History  Social History  Substance Use Topics  . Smoking status: Current Some Day Smoker  . Smokeless tobacco: Never Used  . Alcohol use 0.0 oz/week    Review of Systems  Constitutional:   Positive subjective fevers and chills.  ENT:   No sore throat. No rhinorrhea. Cardiovascular:   No chest pain. Respiratory:   Positive shortness of breath and productive cough. Gastrointestinal:   Negative for abdominal pain, vomiting and diarrhea.  Genitourinary:   Negative for dysuria or difficulty urinating. Musculoskeletal:   Negative for focal pain or swelling Neurological:   Negative for headaches 10-point ROS otherwise  negative.  ____________________________________________   PHYSICAL EXAM:  VITAL SIGNS: ED Triage Vitals  Enc Vitals Group     BP 10/15/15 1347 135/90     Pulse Rate 10/15/15 1347 (!) 128     Resp 10/15/15 1347 (!) 22     Temp 10/15/15 1347 98.7 F (37.1 C)     Temp Source 10/15/15 1347 Oral     SpO2 10/15/15 1347 94 %     Weight 10/15/15 1348 193 lb (87.5 kg)     Height 10/15/15 1348 6' (1.829 m)     Head Circumference --      Peak Flow --      Pain Score 10/15/15 1348 6     Pain Loc --      Pain Edu? --      Excl. in [place]? --     Vital signs reviewed, nursing assessments reviewed.   Constitutional:   Alert and oriented. Ill-appearing. Eyes:   No scleral icterus. No conjunctival pallor. PERRL. EOMI.  No nystagmus. ENT   Head:   Normocephalic and atraumatic.   Nose:   No congestion/rhinnorhea. No septal hematoma   Mouth/Throat:   Dry mucous membranes, no pharyngeal erythema. No peritonsillar mass.    Neck:   No stridor. No SubQ emphysema. No meningismus. Hematological/Lymphatic/Immunilogical:   No cervical lymphadenopathy. Cardiovascular:   Tachycardia heart rate 1:30. Symmetric bilateral radial and DP pulses.  No murmurs.  Respiratory:   Diminished breath sounds diffusely, worse on the left. Also coarse breath sounds and wheezing diffusely. . Gastrointestinal:   Soft and nontender. Non distended. There is no CVA tenderness.  No rebound, rigidity, or guarding. Genitourinary:   deferred Musculoskeletal:   Nontender with normal range of motion in all extremities. No joint effusions.  No lower extremity tenderness.  No edema. Neurologic:   Normal speech and language.  CN 2-10 normal. Motor grossly intact. No gross focal neurologic deficits are appreciated.  Skin:    Skin is warm, dry and intact. No rash noted.  No petechiae, purpura, or bullae.  ____________________________________________    LABS (pertinent positives/negatives) (all labs ordered are  listed, but only abnormal results are displayed) Labs Reviewed  CBC WITH DIFFERENTIAL/PLATELET - Abnormal; Notable for the following:       Result Value   Lymphs Abs 0.6 (*)    All other components within normal limits  CULTURE, BLOOD (ROUTINE X 2)  CULTURE, BLOOD (ROUTINE X 2)  CULTURE, EXPECTORATED SPUTUM-ASSESSMENT  URINE CULTURE  LACTIC ACID, PLASMA  LACTIC ACID, PLASMA  COMPREHENSIVE METABOLIC PANEL  LIPASE, BLOOD  TROPONIN I  APTT  PROTIME-INR  URINALYSIS COMPLETEWITH MICROSCOPIC ([facility] ONLY)   ____________________________________________   EKG  Interpreted by me Sinus tachycardia rate 120, normal axis intervals QRS ST segments and T waves  ____________________________________________    RADIOLOGY  Chest x-ray unremarkable  ____________________________________________   PROCEDURES Procedures CRITICAL CARE Performed  by: Javonne Louissaint   Total critical care time: 35 minutes  Critical care time was exclusive of separately billable procedures and treating other patients.  Critical care was necessary to treat or prevent imminent or life-threatening deterioration.  Critical care was time spent personally by me on the following activities: development of treatment plan with patient and/or surrogate as well as nursing, discussions with consultants, evaluation of patient's response to treatment, examination of patient, obtaining history from patient or surrogate, ordering and performing treatments and interventions, ordering and review of laboratory studies, ordering and review of radiographic studies, pulse oximetry and re-evaluation of patient's condition.  ____________________________________________   INITIAL IMPRESSION / ASSESSMENT AND PLAN / ED COURSE  Pertinent labs & imaging results that were available during my care of the patient were reviewed by me and considered in my medical decision making (see chart for details).  Patient presents with acute  shortness of breath in the setting of chronic COPD that is oxygen dependent. He is febrile and tachycardic. Blood pressure is okay. Exam localizes to his lungs and is highly concerning for pneumonia or decompensation of his pulmonary function. Will check ABG and sepsis workup. IV fluids started  ----------------------------------------- 3:45 PM on 10/15/2015 -----------------------------------------  Despite having seen the patient 1:55 PM, I inadvertently did not order the labs and request secretary to call code sepsis until about 3 PM. Workup is in progress. Case signed out to Dr. Edd Fabian to follow up on labs, plan for admission. Vancomycin and Zosyn pending workup.     Clinical Course   ____________________________________________   FINAL CLINICAL IMPRESSION(S) / ED DIAGNOSES  Final diagnoses:  Chronic respiratory failure with hypoxia (HCC)  Sepsis, due to unspecified organism [facility])       Portions of this note were generated with dragon dictation software. Dictation errors may occur despite best attempts at proofreading.    Carrie Mew, MD 10/15/15 1550

## 2015-10-15 NOTE — Progress Notes (Signed)
Pharmacy Antibiotic Note  Craig Wright is a 50 y.o. male admitted on 10/15/2015 with sepsis.  Pharmacy has been consulted for Vancomycin/Zosyn dosing.  Plan: Vancomycin 1250 mg IV every 8 hours.  Goal trough 15-20 mcg/mL. Zosyn 3.375g IV q8h (4 hour infusion).  Patient received one time doses of vancomycin and Zosyn in the ED, will stack vancomycin for 6 hrs. Ordered a trough level prior to the fifth dose. Monitor SCr.    Ke=0.133 Vd=54.32 T1/2=5.21 Estimated trough= 15  Height: 6' (182.9 cm) Weight: 193 lb (87.5 kg) IBW/kg (Calculated) : 77.6  Temp (24hrs), Avg:98.7 F (37.1 C), Min:98.7 F (37.1 C), Max:98.7 F (37.1 C)  No results for input(s): WBC, CREATININE, LATICACIDVEN, VANCOTROUGH, VANCOPEAK, VANCORANDOM, GENTTROUGH, GENTPEAK, GENTRANDOM, TOBRATROUGH, TOBRAPEAK, TOBRARND, AMIKACINPEAK, AMIKACINTROU, AMIKACIN in the last 168 hours.  CrCl cannot be calculated (Patient's most recent lab result is older than the maximum 21 days allowed.).    No Known Allergies  Antimicrobials this admission: Vancomycin 8/22 >>  Zosyn 8/22 >>    Microbiology results: 8/22 BCx: in process 8/22 UCx: needs to be collected 8/22 Sputum: needs to be collected   Thank you for allowing pharmacy to be a part of this patient's care.  Loree Fee 10/15/2015 3:14 PM

## 2015-10-15 NOTE — ED Notes (Signed)
Code  Sepsis  Called  To  [facility] 

## 2015-10-15 NOTE — ED Notes (Signed)
Attempted to call report to floor and was told that [place] was in another pt room and would call me back

## 2015-10-15 NOTE — ED Provider Notes (Signed)
-----------------------------------------   4:29 PM on 10/15/2015 -----------------------------------------  Care ssumed from Dr. Joni Fears at 3 PM pending results of urinalysis which is unremarkable. Patient meetingcriteria for Sirs without a clear source. Per Dr. Jerene Canny recommendation, will be admitted for fluids, IV antibiotics. Case discussed with hospitalist for admission at this time.   Joanne Gavel, MD 10/15/15 1630

## 2015-10-16 LAB — BLOOD CULTURE ID PANEL (REFLEXED)
Acinetobacter baumannii: NOT DETECTED
CANDIDA ALBICANS: NOT DETECTED
CANDIDA GLABRATA: NOT DETECTED
CANDIDA PARAPSILOSIS: NOT DETECTED
CANDIDA TROPICALIS: NOT DETECTED
Candida krusei: NOT DETECTED
Carbapenem resistance: NOT DETECTED
ENTEROBACTER CLOACAE COMPLEX: NOT DETECTED
ESCHERICHIA COLI: NOT DETECTED
Enterobacteriaceae species: NOT DETECTED
Enterococcus species: NOT DETECTED
Haemophilus influenzae: NOT DETECTED
KLEBSIELLA PNEUMONIAE: NOT DETECTED
Klebsiella oxytoca: NOT DETECTED
Listeria monocytogenes: NOT DETECTED
METHICILLIN RESISTANCE: DETECTED — AB
Neisseria meningitidis: NOT DETECTED
PROTEUS SPECIES: NOT DETECTED
Pseudomonas aeruginosa: NOT DETECTED
STREPTOCOCCUS AGALACTIAE: NOT DETECTED
STREPTOCOCCUS PNEUMONIAE: NOT DETECTED
Serratia marcescens: NOT DETECTED
Staphylococcus aureus (BCID): NOT DETECTED
Staphylococcus species: DETECTED — AB
Streptococcus pyogenes: NOT DETECTED
Streptococcus species: NOT DETECTED
Vancomycin resistance: NOT DETECTED

## 2015-10-16 LAB — HEMOGLOBIN A1C: Hgb A1c MFr Bld: 8 % — ABNORMAL HIGH (ref 4.0–6.0)

## 2015-10-16 LAB — GLUCOSE, CAPILLARY
GLUCOSE-CAPILLARY: 135 mg/dL — AB (ref 65–99)
GLUCOSE-CAPILLARY: 185 mg/dL — AB (ref 65–99)
GLUCOSE-CAPILLARY: 172 mg/dL — AB (ref 65–99)
Glucose-Capillary: 224 mg/dL — ABNORMAL HIGH (ref 65–99)

## 2015-10-16 LAB — URINE CULTURE: CULTURE: NO GROWTH

## 2015-10-16 MED ORDER — METHYLPREDNISOLONE SODIUM SUCC 125 MG IJ SOLR
60.0000 mg | Freq: Four times a day (QID) | INTRAMUSCULAR | Status: DC
  Administered 2015-10-16 – 2015-10-19 (×14): 60 mg via INTRAVENOUS
  Filled 2015-10-16 (×14): qty 2

## 2015-10-16 MED ORDER — LEVOFLOXACIN IN D5W 750 MG/150ML IV SOLN
750.0000 mg | INTRAVENOUS | Status: DC
  Administered 2015-10-16 – 2015-10-18 (×3): 750 mg via INTRAVENOUS
  Filled 2015-10-16 (×3): qty 150

## 2015-10-16 MED ORDER — SENNOSIDES-DOCUSATE SODIUM 8.6-50 MG PO TABS
1.0000 | ORAL_TABLET | Freq: Two times a day (BID) | ORAL | Status: DC
Start: 2015-10-16 — End: 2015-10-23
  Administered 2015-10-16 – 2015-10-22 (×13): 1 via ORAL
  Filled 2015-10-16 (×15): qty 1

## 2015-10-16 MED ORDER — VANCOMYCIN HCL 10 G IV SOLR
1250.0000 mg | Freq: Three times a day (TID) | INTRAVENOUS | Status: DC
  Administered 2015-10-17 – 2015-10-18 (×5): 1250 mg via INTRAVENOUS
  Filled 2015-10-16 (×7): qty 1250

## 2015-10-16 MED ORDER — VANCOMYCIN HCL 10 G IV SOLR
1250.0000 mg | Freq: Once | INTRAVENOUS | Status: AC
  Administered 2015-10-16: 21:00:00 1250 mg via INTRAVENOUS
  Filled 2015-10-16: qty 1250

## 2015-10-16 MED ORDER — INSULIN GLARGINE 100 UNIT/ML ~~LOC~~ SOLN
8.0000 [IU] | Freq: Every day | SUBCUTANEOUS | Status: DC
  Administered 2015-10-16: 22:00:00 8 [IU] via SUBCUTANEOUS
  Filled 2015-10-16 (×2): qty 0.08

## 2015-10-16 MED ORDER — BUDESONIDE 0.25 MG/2ML IN SUSP
0.2500 mg | Freq: Two times a day (BID) | RESPIRATORY_TRACT | Status: DC
  Administered 2015-10-16 – 2015-10-18 (×5): 0.25 mg via RESPIRATORY_TRACT
  Filled 2015-10-16 (×6): qty 2

## 2015-10-16 NOTE — Progress Notes (Signed)
PHARMACY - PHYSICIAN COMMUNICATION CRITICAL VALUE ALERT - BLOOD CULTURE IDENTIFICATION (BCID)  Results for orders placed or performed during the hospital encounter of 10/15/15  Blood Culture ID Panel (Reflexed) (Collected: 10/15/2015  1:53 PM)  Result Value Ref Range   Enterococcus species NOT DETECTED NOT DETECTED   Vancomycin resistance NOT DETECTED NOT DETECTED   Listeria monocytogenes NOT DETECTED NOT DETECTED   Staphylococcus species DETECTED (A) NOT DETECTED   Staphylococcus aureus NOT DETECTED NOT DETECTED   Methicillin resistance DETECTED (A) NOT DETECTED   Streptococcus species NOT DETECTED NOT DETECTED   Streptococcus agalactiae NOT DETECTED NOT DETECTED   Streptococcus pneumoniae NOT DETECTED NOT DETECTED   Streptococcus pyogenes NOT DETECTED NOT DETECTED   Acinetobacter baumannii NOT DETECTED NOT DETECTED   Enterobacteriaceae species NOT DETECTED NOT DETECTED   Enterobacter cloacae complex NOT DETECTED NOT DETECTED   Escherichia coli NOT DETECTED NOT DETECTED   Klebsiella oxytoca NOT DETECTED NOT DETECTED   Klebsiella pneumoniae NOT DETECTED NOT DETECTED   Proteus species NOT DETECTED NOT DETECTED   Serratia marcescens NOT DETECTED NOT DETECTED   Carbapenem resistance NOT DETECTED NOT DETECTED   Haemophilus influenzae NOT DETECTED NOT DETECTED   Neisseria meningitidis NOT DETECTED NOT DETECTED   Pseudomonas aeruginosa NOT DETECTED NOT DETECTED   Candida albicans NOT DETECTED NOT DETECTED   Candida glabrata NOT DETECTED NOT DETECTED   Candida krusei NOT DETECTED NOT DETECTED   Candida parapsilosis NOT DETECTED NOT DETECTED   Candida tropicalis NOT DETECTED NOT DETECTED    Name of physician (or Provider) Contacted: [place]   Changes to prescribed antibiotics required: Yes,  Will start patient on vancomycin 1250 mg  Niclas Markell D 10/16/2015  5:21 PM

## 2015-10-16 NOTE — Progress Notes (Signed)
Patient ID: Craig Wright, male   DOB: 03/18/1965, 50 y.o.   MRN: [id number]  [employer] PROGRESS NOTE  Craig Wright QJF:[id number] DOB: Dec 29, 1965 DOA: 10/15/2015 PCP: No PCP Per Patient  HPI/Subjective: Patient feeling a little bit better today. Yesterday felt weird with chills and fever. He always feels short of breath. He is coughing up clear phlegm.  Objective: Vitals:   10/15/15 2200 10/16/15 0529  BP:  (!) 149/79  Pulse:  (!) 113  Resp: (!) 24 20  Temp:  98.4 F (36.9 C)    Filed Weights   10/15/15 1348  Weight: 87.5 kg (193 lb)    ROS: Review of Systems  Constitutional: Negative for chills and fever.  Eyes: Negative for blurred vision.  Respiratory: Positive for cough, shortness of breath and wheezing.   Cardiovascular: Negative for chest pain.  Gastrointestinal: Negative for abdominal pain, constipation, diarrhea, nausea and vomiting.  Genitourinary: Negative for dysuria.  Musculoskeletal: Negative for joint pain.  Neurological: Negative for dizziness and headaches.   Exam: Physical Exam  Constitutional: He is oriented to person, place, and time.  HENT:  Nose: No mucosal edema.  Mouth/Throat: No oropharyngeal exudate or posterior oropharyngeal edema.  Eyes: Conjunctivae, EOM and lids are normal. Pupils are equal, round, and reactive to light.  Neck: No JVD present. Carotid bruit is not present. No edema present. No thyroid mass and no thyromegaly present.  Cardiovascular: S1 normal and S2 normal.  Exam reveals no gallop.   Murmur heard.  Systolic murmur is present with a grade of 2/6  Pulses:      Dorsalis pedis pulses are 2+ on the right side, and 2+ on the left side.  Respiratory: Accessory muscle usage present. No respiratory distress. He has decreased breath sounds in the right middle field, the right lower field, the left middle field and the left lower field. He has wheezes in the right upper field, the right middle field, the right lower  field, the left upper field, the left middle field and the left lower field. He has no rhonchi. He has no rales.  GI: Soft. Bowel sounds are normal. There is no tenderness.  Musculoskeletal:       Right ankle: He exhibits swelling.       Left ankle: He exhibits swelling.  Lymphadenopathy:    He has no cervical adenopathy.  Neurological: He is alert and oriented to person, place, and time. No cranial nerve deficit.  Skin: Skin is warm. No rash noted. Nails show no clubbing.  Psychiatric: He has a normal mood and affect.      Data Reviewed: Basic Metabolic Panel:  Recent Labs Lab 10/15/15 1353  NA 133*  K 3.4*  CL 90*  CO2 36*  GLUCOSE 173*  BUN 6  CREATININE 0.62  CALCIUM 8.5*   Liver Function Tests:  Recent Labs Lab 10/15/15 1353  AST 89*  ALT 195*  ALKPHOS 53  BILITOT 0.5  PROT 6.9  ALBUMIN 3.4*    Recent Labs Lab 10/15/15 1353  LIPASE 16   CBC:  Recent Labs Lab 10/15/15 1353  WBC 4.6  NEUTROABS 3.0  HGB 14.9  HCT 44.1  MCV 88.3  PLT 152   Cardiac Enzymes:  Recent Labs Lab 10/15/15 1353  TROPONINI <0.03   BNP (last 3 results)  Recent Labs  06/26/15 0232  BNP 21.0    CBG:  Recent Labs Lab 10/15/15 2127 10/16/15 0759 10/16/15 1203  GLUCAP 275* 135* 185*    Recent  Results (from the past 240 hour(s))  Blood Culture (routine x 2)     Status: None (Preliminary result)   Collection Time: 10/15/15  1:53 PM  Result Value Ref Range Status   Specimen Description BLOOD RIGHT WRIST  Final   Special Requests BOTTLES DRAWN AEROBIC AND ANAEROBIC [place]  Final   Culture NO GROWTH < 24 HOURS  Final   Report Status PENDING  Incomplete  Blood Culture (routine x 2)     Status: None (Preliminary result)   Collection Time: 10/15/15  1:53 PM  Result Value Ref Range Status   Specimen Description BLOOD RIGHT ASSIST CONTROL  Final   Special Requests BOTTLES DRAWN AEROBIC AND ANAEROBIC 2CCAERO,5CCANA  Final   Culture NO GROWTH < 24 HOURS   Final   Report Status PENDING  Incomplete  MRSA PCR Screening     Status: None   Collection Time: 10/15/15  6:32 PM  Result Value Ref Range Status   MRSA by PCR NEGATIVE NEGATIVE Final    Comment:        The GeneXpert MRSA Assay (FDA approved for NASAL specimens only), is one component of a comprehensive MRSA colonization surveillance program. It is not intended to diagnose MRSA infection nor to guide or monitor treatment for MRSA infections.      Studies: Dg Chest Port 1 View  Result Date: 10/15/2015 CLINICAL DATA:  Productive cough, fever, shortness of breath. EXAM: PORTABLE CHEST 1 VIEW COMPARISON:  Radiograph Jun 26, 2015. FINDINGS: The heart size and mediastinal contours are within normal limits. No pneumothorax or pleural effusion is noted. Stable scarring is noted in both lung bases. No acute pulmonary disease is noted. The visualized skeletal structures are unremarkable. IMPRESSION: No acute cardiopulmonary abnormality seen. Electronically Signed   By: Marijo Conception, M.D.   On: 10/15/2015 15:33    Scheduled Meds: . budesonide (PULMICORT) nebulizer solution  0.25 mg Nebulization BID  . clonazePAM  1 mg Oral BID  . DULoxetine  60 mg Oral BID  . enoxaparin (LOVENOX) injection  40 mg Subcutaneous Q24H  . insulin aspart  0-5 Units Subcutaneous QHS  . insulin aspart  0-9 Units Subcutaneous TID WC  . ipratropium-albuterol  3 mL Nebulization Q4H  . levofloxacin (LEVAQUIN) IV  750 mg Intravenous Q24H  . methylPREDNISolone (SOLU-MEDROL) injection  60 mg Intravenous Q6H  . mirtazapine  45 mg Oral QHS  . oxyCODONE  20 mg Oral Q12H  . risperiDONE  2 mg Oral QHS  . roflumilast  500 mcg Oral Daily  . senna-docusate  1 tablet Oral BID  . sodium chloride flush  3 mL Intravenous Q12H  . tiotropium  18 mcg Inhalation Daily   Continuous Infusions: . sodium chloride 50 mL/hr at 10/16/15 1105    Assessment/Plan:  1. Acute on chronic hypoxic respiratory failure. Patient is on  baseline 5 L of oxygen. I dialed him down to 5 L from 6 L. 2. COPD exacerbation increase Solu-Medrol 60 mg daily 6 hours. Add budesonide nebulizers. Continue DuoNeb nebulizers. Change antibiotics to Levaquin. Since chest x-ray negative for pneumonia. 3. Systemic inflammatory response syndrome. Likely vital signs abnormalities because of acute respiratory failure and COPD exacerbation. No signs of sepsis. 4. Tachycardia- start cardizem cd 5. Chronic pain- contniue medications 6. Type 2 diabetes- sliding scale.  Add low dose lantus 7. Anxiety/depression- continue medications  Code Status:     Code Status Orders        Start     Ordered  10/15/15 1644  Do not attempt resuscitation (DNR)  Continuous    Question Answer Comment  In the event of cardiac or respiratory ARREST Do not call a "code blue"   In the event of cardiac or respiratory ARREST Do not perform Intubation, CPR, defibrillation or ACLS   In the event of cardiac or respiratory ARREST Use medication by any route, position, wound care, and other measures to relive pain and suffering. May use oxygen, suction and manual treatment of airway obstruction as needed for comfort.      10/15/15 1643    Code Status History    Date Active Date Inactive Code Status Order ID Comments User Context   10/15/2015  4:43 PM 10/15/2015  4:43 PM Full Code [id number]  Lytle Butte, MD ED   06/26/2015 10:13 AM 06/28/2015  6:05 PM DNR [id number]  Bettey Costa, MD Inpatient   06/26/2015  6:05 AM 06/26/2015 10:13 AM Full Code [id number]  Saundra Shelling, MD Inpatient   06/08/2015 12:15 AM 06/10/2015  4:32 PM Full Code [id number]  Saundra Shelling, MD Inpatient    Advance Directive Documentation   [place] Most Recent Value  Type of Advance Directive  Living will  Pre-existing out of facility DNR order (yellow form or pink MOST form)  No data  "MOST" Form in Place?  No data     Disposition Plan: Breathing will need to improve prior to  disposition  Antibiotics:  Levaquin  Time spent: 28 minutes  Loletha Grayer  [employer]

## 2015-10-16 NOTE — [place] FL2 (Signed)
[place] LEVEL OF CARE SCREENING TOOL     IDENTIFICATION  Patient Name: Craig Wright Birthdate: 04/20/1965 Sex: male Admission Date (Current Location): 10/15/2015  [facility] and [place] Number:  [name]  ([id number] [facility]) Facility and Address:  [facility], [address], [place], [place] [address]      Provider Number: [id number]  Attending Physician Name and Address:  Loletha Grayer, MD  Relative Name and Phone Number:       Current Level of Care: Domiciliary (Rest home) Recommended Level of Care: [facility] Prior Approval Number:    Date Approved/Denied:   PASRR Number:  [id number])  Discharge Plan: Domiciliary (Rest home)    Current Diagnoses: Patient Active Problem List   Diagnosis Date Noted  . Acute on chronic respiratory failure with hypoxia ([place]) 10/15/2015   . Anxiety disorder   . Asthma   . COPD (chronic obstructive pulmonary disease) ([place])   . Diabetes mellitus without complication ([place])   . Hypertension      Orientation RESPIRATION BLADDER Height & Weight     Self, Time, Situation, Place  O2 (4-5 Liters Oxygen) Continent Weight: 193 lb (87.5 kg) Height:  6' (182.9 cm)  BEHAVIORAL SYMPTOMS/MOOD NEUROLOGICAL BOWEL NUTRITION STATUS   (none)  (none) Continent Diet (Diet: Heart Healthy )  AMBULATORY STATUS COMMUNICATION OF NEEDS Skin   Supervision Verbally Normal                       Personal Care Assistance Level of Assistance  Bathing, Feeding, Dressing Bathing Assistance: Limited assistance Feeding assistance: Independent Dressing Assistance: Limited assistance     Functional Limitations Info  Sight, Hearing, Speech Sight Info: Adequate Hearing Info: Adequate Speech Info: Adequate    SPECIAL CARE FACTORS FREQUENCY                       Contractures      Additional Factors Info  Code Status, Allergies, Insulin Sliding Scale Code Status Info:  (DNR ) Allergies Info:   (No Known Allergies. )   Insulin Sliding Scale Info:  (NovoLog Insulin Injections 3 times per day. )       Current Medications (10/16/2015):  This is the current hospital active medication list Current Facility-Administered Medications  Medication Dose Route Frequency Provider Last Rate Last Dose  . 0.9 %  sodium chloride infusion   Intravenous Continuous Loletha Grayer, MD 50 mL/hr at 10/16/15 1105    . acetaminophen (TYLENOL) tablet 650 mg  650 mg Oral Q6H PRN Lytle Butte, MD       Or  . acetaminophen (TYLENOL) suppository 650 mg  650 mg Rectal Q6H PRN Lytle Butte, MD      . budesonide (PULMICORT) nebulizer solution 0.25 mg  0.25 mg Nebulization BID Loletha Grayer, MD   0.25 mg at 10/16/15 1143  . clonazePAM (KLONOPIN) tablet 1 mg  1 mg Oral BID Lytle Butte, MD      . DULoxetine (CYMBALTA) DR capsule 60 mg  60 mg Oral BID Lytle Butte, MD   60 mg at 10/16/15 0847  . enoxaparin (LOVENOX) injection 40 mg  40 mg Subcutaneous Q24H Lytle Butte, MD      . insulin aspart (novoLOG) injection 0-5 Units  0-5 Units Subcutaneous QHS Lytle Butte, MD   3 Units at 10/15/15 2133  . insulin aspart (novoLOG) injection 0-9 Units  0-9 Units Subcutaneous TID WC Lytle Butte,  MD   1 Units at 10/16/15 0846  . ipratropium-albuterol (DUONEB) 0.5-2.5 (3) MG/3ML nebulizer solution 3 mL  3 mL Nebulization Q4H Lytle Butte, MD   3 mL at 10/16/15 1143  . levofloxacin (LEVAQUIN) IVPB 750 mg  750 mg Intravenous Q24H Darylene Price Doffing, [facility]      . methylPREDNISolone sodium succinate (SOLU-MEDROL) 125 mg/2 mL injection 60 mg  60 mg Intravenous Q6H Richard Leslye Peer, MD      . mirtazapine (REMERON) tablet 45 mg  45 mg Oral QHS Lytle Butte, MD   45 mg at 10/15/15 2124  . ondansetron (ZOFRAN) tablet 4 mg  4 mg Oral Q6H PRN Lytle Butte, MD       Or  . ondansetron [facility]) injection 4 mg  4 mg Intravenous Q6H PRN Lytle Butte, MD      . oxyCODONE (Oxy IR/ROXICODONE) immediate release tablet 5 mg  5 mg Oral Q4H PRN  Lytle Butte, MD   5 mg at 10/16/15 7711  . oxyCODONE (OXYCONTIN) 12 hr tablet 20 mg  20 mg Oral Q12H Lytle Butte, MD   20 mg at 10/16/15 1000  . Oxycodone HCl TABS 10 mg  10 mg Oral Q4H PRN Lytle Butte, MD      . risperiDONE (RISPERDAL) tablet 2 mg  2 mg Oral QHS Lytle Butte, MD   2 mg at 10/15/15 2124  . roflumilast (DALIRESP) tablet 500 mcg  500 mcg Oral Daily Lytle Butte, MD   500 mcg at 10/16/15 0847  . sodium chloride flush (NS) 0.9 % injection 3 mL  3 mL Intravenous Q12H Lytle Butte, MD      . tiotropium [facility]) inhalation capsule 18 mcg  18 mcg Inhalation Daily Lytle Butte, MD   18 mcg at 10/16/15 6579     Discharge Medications: Please see discharge summary for a list of discharge medications.  Relevant Imaging Results:  Relevant Lab Results:   Additional Information  (SSN: [id number])  Hanna Ra, Veronia Beets, LCSW

## 2015-10-16 NOTE — Progress Notes (Signed)
Pharmacy Antibiotic Note  Craig Wright is a 50 y.o. male admitted on 10/15/2015 with pneumonia.  Pharmacy has been consulted for levofloxacin dosing.  This is day #2 of antibiotics. Antibiotics are being changed from vancomycin and piperacillin/tazobactam to levofloxacin.  Plan: Levofloxacin 750 mg IV daily  Height: 6' (182.9 cm) Weight: 193 lb (87.5 kg) IBW/kg (Calculated) : 77.6  Temp (24hrs), Avg:98.3 F (36.8 C), Min:98 F (36.7 C), Max:98.7 F (37.1 C)   Recent Labs Lab 10/15/15 1353 10/15/15 1926  WBC 4.6  --   CREATININE 0.62  --   LATICACIDVEN 1.5 1.2    Estimated Creatinine Clearance: 119.9 mL/min (by C-G formula based on SCr of 0.8 mg/dL).    No Known Allergies  Antimicrobials this admission: Vancomycin and piperacillin/tazobactam 8/22 >> 8/23 levofloxacin 8/23 >>   Dose adjustments this admission:  Microbiology results: 8/22 BCx: Pending 8/22 UCx: Sent  8/22 Sputum: Sent  8/22 MRSA PCR: negative  Thank you for allowing pharmacy to be a part of this patient's care.  Lenis Noon, PharmD, BCPS Clinical Pharmacist 10/16/2015 11:08 AM

## 2015-10-16 NOTE — Clinical Social Work Note (Signed)
Clinical Social Work Assessment  Patient Details  Name: Craig Wright MRN: [id number] Date of Birth: 11/06/1965  Date of referral:  10/16/15               Reason for consult:  Discharge Planning                Permission sought to share information with:  Facility Contact Representative Permission granted to share information::  Yes, Verbal Permission Granted  Name::        Agency::   ([facility])  Relationship::     Contact Information:     Housing/Transportation Living arrangements for the past 2 months:  Assisted Living Facility ([facility]) Source of Information:  Patient Patient Interpreter Needed:  None Criminal Activity/Legal Involvement Pertinent to Current Situation/Hospitalization:  No - Comment as needed Significant Relationships:  Friend Lives with:  Facility Resident ([facility]) Do you feel safe going back to the place where you live?  Yes Need for family participation in patient care:  No (Coment)  Care giving concerns:  Patient is from [facility].    Social Worker assessment / plan:  CSW met with patient at bedside. CSW introduced herself and her role. Patient reports that he's from [facility]. Informed CSW that he's been living there since January 2017. Reported that he is on 5 (L/min) O2 chronically. Granted CSW verbal permission to contact [facility]. Patient reported they can also transport him back at discharge. Patient is in agreement to return to [facility] at discharge.    CSW contacted [facility]. Left voicemail. Awaiting phone call back. FL2/ PASRR completed and placed in chart. Will send for MD signature on the day of discharge due to CSW having to update discharge medications on the FL2. CSW will continue to follow and assist.   Employment status:  Retired Insurance information:  Medicaid In State PT Recommendations:  Not assessed at this time Information / Referral to community resources:     Patient/Family's Response  to care:  Patient in agreement to return to [facility] at discharge.   Patient/Family's Understanding of and Emotional Response to Diagnosis, Current Treatment, and Prognosis:  Patient reports the understands his  Diagnosis, Current Treatment, and Prognosis. Thanked CSW for her assistance.   Emotional Assessment Appearance:  Appears stated age Attitude/Demeanor/Rapport:   (None) Affect (typically observed):  Accepting, Calm, Pleasant Orientation:  Oriented to Self, Oriented to Place, Oriented to  Time, Oriented to Situation Alcohol / Substance use:  Not Applicable Psych involvement (Current and /or in the community):  No (Comment)  Discharge Needs  Concerns to be addressed:  Discharge Planning Concerns Readmission within the last 30 days:  No Current discharge risk:  Chronically ill Barriers to Discharge:  Continued Medical Work up    E , LCSW 10/16/2015, 3:37 PM  

## 2015-10-16 NOTE — Progress Notes (Signed)
ANTIBIOTIC CONSULT NOTE - INITIAL  Pharmacy Consult for Vancomycin  Indication: bacteremia  No Known Allergies  Patient Measurements: Height: 6' (182.9 cm) Weight: 193 lb (87.5 kg) IBW/kg (Calculated) : 77.6 Adjusted Body Weight: 81.56 kg   Vital Signs: Temp: 98.4 F (36.9 C) (08/23 1456) Temp Source: Oral (08/23 1456) BP: 124/64 (08/23 1456) Pulse Rate: 111 (08/23 1456) Intake/Output from previous day: 08/22 0701 - 08/23 0700 In: 1510 [P.O.:240; I.V.:970; IV Piggyback:300] Out: -  Intake/Output from this shift: Total I/O In: 1310 [P.O.:480; I.V.:680; IV Piggyback:150] Out: 2 [Urine:1; Stool:1]  Labs:  Recent Labs  10/15/15 1353  WBC 4.6  HGB 14.9  PLT 152  CREATININE 0.62   Estimated Creatinine Clearance: 119.9 mL/min (by C-G formula based on SCr of 0.8 mg/dL). No results for input(s): VANCOTROUGH, VANCOPEAK, VANCORANDOM, GENTTROUGH, GENTPEAK, GENTRANDOM, TOBRATROUGH, TOBRAPEAK, TOBRARND, AMIKACINPEAK, AMIKACINTROU, AMIKACIN in the last 72 hours.   Microbiology: Recent Results (from the past 720 hour(s))  Blood Culture (routine x 2)     Status: None (Preliminary result)   Collection Time: 10/15/15  1:53 PM  Result Value Ref Range Status   Specimen Description BLOOD RIGHT WRIST  Final   Special Requests BOTTLES DRAWN AEROBIC AND ANAEROBIC [place]  Final   Culture NO GROWTH < 24 HOURS  Final   Report Status PENDING  Incomplete  Blood Culture (routine x 2)     Status: None (Preliminary result)   Collection Time: 10/15/15  1:53 PM  Result Value Ref Range Status   Specimen Description BLOOD RIGHT ASSIST CONTROL  Final   Special Requests BOTTLES DRAWN AEROBIC AND ANAEROBIC 2CCAERO,5CCANA  Final   Culture  Setup Time   Final    GRAM POSITIVE COCCI AEROBIC BOTTLE ONLY CRITICAL RESULT CALLED TO, READ BACK BY AND VERIFIED WITHVioleta Gelinas [place] AT 1535 10/16/15 MSS. CONFIRMED BY QSD Organism ID to follow    Culture NO GROWTH < 24 HOURS  Final   Report  Status PENDING  Incomplete  Blood Culture ID Panel (Reflexed)     Status: Abnormal   Collection Time: 10/15/15  1:53 PM  Result Value Ref Range Status   Enterococcus species NOT DETECTED NOT DETECTED Final   Vancomycin resistance NOT DETECTED NOT DETECTED Final   Listeria monocytogenes NOT DETECTED NOT DETECTED Final   Staphylococcus species DETECTED (A) NOT DETECTED Final    Comment: CRITICAL RESULT CALLED TO, READ BACK BY AND VERIFIED WITHVioleta Gelinas [place] AT 7425 10/16/15 MSS.    Staphylococcus aureus NOT DETECTED NOT DETECTED Final   Methicillin resistance DETECTED (A) NOT DETECTED Final    Comment: CRITICAL RESULT CALLED TO, READ BACK BY AND VERIFIED WITHVioleta Gelinas [place] AT 9563 10/16/15 MSS.    Streptococcus species NOT DETECTED NOT DETECTED Final   Streptococcus agalactiae NOT DETECTED NOT DETECTED Final   Streptococcus pneumoniae NOT DETECTED NOT DETECTED Final   Streptococcus pyogenes NOT DETECTED NOT DETECTED Final   Acinetobacter baumannii NOT DETECTED NOT DETECTED Final   Enterobacteriaceae species NOT DETECTED NOT DETECTED Final   Enterobacter cloacae complex NOT DETECTED NOT DETECTED Final   Escherichia coli NOT DETECTED NOT DETECTED Final   Klebsiella oxytoca NOT DETECTED NOT DETECTED Final   Klebsiella pneumoniae NOT DETECTED NOT DETECTED Final   Proteus species NOT DETECTED NOT DETECTED Final   Serratia marcescens NOT DETECTED NOT DETECTED Final   Carbapenem resistance NOT DETECTED NOT DETECTED Final   Haemophilus influenzae NOT DETECTED NOT DETECTED Final   Neisseria meningitidis NOT DETECTED NOT DETECTED  Final   Pseudomonas aeruginosa NOT DETECTED NOT DETECTED Final   Candida albicans NOT DETECTED NOT DETECTED Final   Candida glabrata NOT DETECTED NOT DETECTED Final   Candida krusei NOT DETECTED NOT DETECTED Final   Candida parapsilosis NOT DETECTED NOT DETECTED Final   Candida tropicalis NOT DETECTED NOT DETECTED Final  Urine culture     Status: None    Collection Time: 10/15/15  3:35 PM  Result Value Ref Range Status   Specimen Description URINE, RANDOM  Final   Special Requests NONE  Final   Culture NO GROWTH Performed at [facility]   Final   Report Status 10/16/2015 FINAL  Final  MRSA PCR Screening     Status: None   Collection Time: 10/15/15  6:32 PM  Result Value Ref Range Status   MRSA by PCR NEGATIVE NEGATIVE Final    Comment:        The GeneXpert MRSA Assay (FDA approved for NASAL specimens only), is one component of a comprehensive MRSA colonization surveillance program. It is not intended to diagnose MRSA infection nor to guide or monitor treatment for MRSA infections.     Medical History: Past Medical History:  Diagnosis Date  . Anxiety disorder   . Asthma   . COPD (chronic obstructive pulmonary disease) ([place])   . Diabetes mellitus without complication ([place])   . Hypertension     Medications:  Scheduled:  . budesonide (PULMICORT) nebulizer solution  0.25 mg Nebulization BID  . clonazePAM  1 mg Oral BID  . DULoxetine  60 mg Oral BID  . enoxaparin (LOVENOX) injection  40 mg Subcutaneous Q24H  . insulin aspart  0-5 Units Subcutaneous QHS  . insulin aspart  0-9 Units Subcutaneous TID WC  . insulin glargine  8 Units Subcutaneous QHS  . ipratropium-albuterol  3 mL Nebulization Q4H  . levofloxacin (LEVAQUIN) IV  750 mg Intravenous Q24H  . methylPREDNISolone (SOLU-MEDROL) injection  60 mg Intravenous Q6H  . mirtazapine  45 mg Oral QHS  . oxyCODONE  20 mg Oral Q12H  . risperiDONE  2 mg Oral QHS  . roflumilast  500 mcg Oral Daily  . senna-docusate  1 tablet Oral BID  . sodium chloride flush  3 mL Intravenous Q12H  . tiotropium  18 mcg Inhalation Daily  . vancomycin  1,250 mg Intravenous Once  . [START ON 10/17/2015] vancomycin  1,250 mg Intravenous Q8H   Assessment: CrCl = 110 ml/min Ke = 0.096 hr-1 T1/2 = 7.22 hrs Vd = 61.25 L   Goal of Therapy:  Vancomycin trough level 15-20 mcg/ml  Plan:   Expected duration 7 days with resolution of temperature and/or normalization of WBC   Vancomycin 1250 mg IV X 1 ordered for 8/23 @ 18:00. Vancomycin 1250 mg IV Q8H ordered to start 8/24 @ 00:00, 6 hrs after 1st dose (stacked dosing). This pt will reach Css by 8/25 @ 0600. Will draw 1st trough on 8/24 @ 23:30, which will be close to Css.   Floy Riegler D 10/16/2015,5:34 PM

## 2015-10-17 LAB — GLUCOSE, CAPILLARY
GLUCOSE-CAPILLARY: 175 mg/dL — AB (ref 65–99)
GLUCOSE-CAPILLARY: 276 mg/dL — AB (ref 65–99)
GLUCOSE-CAPILLARY: 106 mg/dL — AB (ref 65–99)
Glucose-Capillary: 240 mg/dL — ABNORMAL HIGH (ref 65–99)

## 2015-10-17 MED ORDER — DILTIAZEM HCL ER COATED BEADS 120 MG PO CP24
120.0000 mg | ORAL_CAPSULE | Freq: Every day | ORAL | Status: DC
  Administered 2015-10-17 – 2015-10-18 (×2): 120 mg via ORAL
  Filled 2015-10-17 (×2): qty 1

## 2015-10-17 MED ORDER — INSULIN GLARGINE 100 UNIT/ML ~~LOC~~ SOLN
12.0000 [IU] | Freq: Every day | SUBCUTANEOUS | Status: DC
  Administered 2015-10-17 – 2015-10-21 (×5): 12 [IU] via SUBCUTANEOUS
  Filled 2015-10-17 (×7): qty 0.12

## 2015-10-17 NOTE — Progress Notes (Signed)
Patient ID: Craig Wright, male   DOB: [birth year], 50 y.o.   MRN: [id number]  [facility] PROGRESS NOTE  Craig Wright NWG:[id number] DOB: [birth year] DOA: 10/15/2015 PCP: No PCP Per Patient  HPI/Subjective:   Objective: Vitals:   10/16/15 2051 10/17/15 0547  BP: 139/87 (!) 148/86  Pulse: (!) 104 (!) 102  Resp: 18   Temp: 97.7 F (36.5 C) 97.8 F (36.6 C)    Filed Weights   10/15/15 1348  Weight: 87.5 kg (193 lb)    ROS: Review of Systems  Constitutional: Negative for chills and fever.  Eyes: Negative for blurred vision.  Respiratory: Positive for cough, shortness of breath and wheezing.   Cardiovascular: Negative for chest pain.  Gastrointestinal: Negative for abdominal pain, constipation, diarrhea, nausea and vomiting.  Genitourinary: Negative for dysuria.  Musculoskeletal: Negative for joint pain.  Neurological: Negative for dizziness and headaches.   Exam: Physical Exam  Constitutional: He is oriented to person, place, and time.  HENT:  Nose: No mucosal edema.  Mouth/Throat: No oropharyngeal exudate or posterior oropharyngeal edema.  Eyes: Conjunctivae, EOM and lids are normal. Pupils are equal, round, and reactive to light.  Neck: No JVD present. Carotid bruit is not present. No edema present. No thyroid mass and no thyromegaly present.  Cardiovascular: S1 normal and S2 normal.  Exam reveals no gallop.   Murmur heard.  Systolic murmur is present with a grade of 2/6  Pulses:      Dorsalis pedis pulses are 2+ on the right side, and 2+ on the left side.  Respiratory: Accessory muscle usage present. No respiratory distress. He has decreased breath sounds in the right middle field, the right lower field, the left middle field and the left lower field. He has wheezes in the right middle field, the right lower field, the left middle field and the left lower field. He has no rhonchi. He has no rales.  GI: Soft. Bowel sounds are normal. There is no tenderness.   Musculoskeletal:       Right ankle: He exhibits swelling.       Left ankle: He exhibits swelling.  Lymphadenopathy:    He has no cervical adenopathy.  Neurological: He is alert and oriented to person, place, and time. No cranial nerve deficit.  Skin: Skin is warm. No rash noted. Nails show no clubbing.  Psychiatric: He has a normal mood and affect.      Data Reviewed: Basic Metabolic Panel:  Recent Labs Lab 10/15/15 1353  NA 133*  K 3.4*  CL 90*  CO2 36*  GLUCOSE 173*  BUN 6  CREATININE 0.62  CALCIUM 8.5*   Liver Function Tests:  Recent Labs Lab 10/15/15 1353  AST 89*  ALT 195*  ALKPHOS 53  BILITOT 0.5  PROT 6.9  ALBUMIN 3.4*    Recent Labs Lab 10/15/15 1353  LIPASE 16   CBC:  Recent Labs Lab 10/15/15 1353  WBC 4.6  NEUTROABS 3.0  HGB 14.9  HCT 44.1  MCV 88.3  PLT 152   Cardiac Enzymes:  Recent Labs Lab 10/15/15 1353  TROPONINI <0.03   BNP (last 3 results)  Recent Labs  06/26/15 0232  BNP 21.0    CBG:  Recent Labs Lab 10/16/15 1203 10/16/15 1711 10/16/15 2054 10/17/15 0736 10/17/15 1115  GLUCAP 185* 172* 224* 175* 276*    Recent Results (from the past 240 hour(s))  Blood Culture (routine x 2)     Status: None (Preliminary result)   Collection Time:  10/15/15  1:53 PM  Result Value Ref Range Status   Specimen Description BLOOD RIGHT WRIST  Final   Special Requests BOTTLES DRAWN AEROBIC AND ANAEROBIC [place]  Final   Culture NO GROWTH 2 DAYS  Final   Report Status PENDING  Incomplete  Blood Culture (routine x 2)     Status: None (Preliminary result)   Collection Time: 10/15/15  1:53 PM  Result Value Ref Range Status   Specimen Description BLOOD RIGHT ASSIST CONTROL  Final   Special Requests BOTTLES DRAWN AEROBIC AND ANAEROBIC 2CCAERO,5CCANA  Final   Culture  Setup Time   Final    GRAM POSITIVE COCCI AEROBIC BOTTLE ONLY CRITICAL RESULT CALLED TO, READ BACK BY AND VERIFIED WITHVioleta Gelinas [place] AT 1535 10/16/15  MSS. CONFIRMED BY QSD Organism ID to follow    Culture   Final    TOO YOUNG TO READ Performed at [facility]    Report Status PENDING  Incomplete  Blood Culture ID Panel (Reflexed)     Status: Abnormal   Collection Time: 10/15/15  1:53 PM  Result Value Ref Range Status   Enterococcus species NOT DETECTED NOT DETECTED Final   Vancomycin resistance NOT DETECTED NOT DETECTED Final   Listeria monocytogenes NOT DETECTED NOT DETECTED Final   Staphylococcus species DETECTED (A) NOT DETECTED Final    Comment: CRITICAL RESULT CALLED TO, READ BACK BY AND VERIFIED WITHVioleta Gelinas [place] AT 3419 10/16/15 MSS.    Staphylococcus aureus NOT DETECTED NOT DETECTED Final   Methicillin resistance DETECTED (A) NOT DETECTED Final    Comment: CRITICAL RESULT CALLED TO, READ BACK BY AND VERIFIED WITHVioleta Gelinas [place] AT 6222 10/16/15 MSS.    Streptococcus species NOT DETECTED NOT DETECTED Final   Streptococcus agalactiae NOT DETECTED NOT DETECTED Final   Streptococcus pneumoniae NOT DETECTED NOT DETECTED Final   Streptococcus pyogenes NOT DETECTED NOT DETECTED Final   Acinetobacter baumannii NOT DETECTED NOT DETECTED Final   Enterobacteriaceae species NOT DETECTED NOT DETECTED Final   Enterobacter cloacae complex NOT DETECTED NOT DETECTED Final   Escherichia coli NOT DETECTED NOT DETECTED Final   Klebsiella oxytoca NOT DETECTED NOT DETECTED Final   Klebsiella pneumoniae NOT DETECTED NOT DETECTED Final   Proteus species NOT DETECTED NOT DETECTED Final   Serratia marcescens NOT DETECTED NOT DETECTED Final   Carbapenem resistance NOT DETECTED NOT DETECTED Final   Haemophilus influenzae NOT DETECTED NOT DETECTED Final   Neisseria meningitidis NOT DETECTED NOT DETECTED Final   Pseudomonas aeruginosa NOT DETECTED NOT DETECTED Final   Candida albicans NOT DETECTED NOT DETECTED Final   Candida glabrata NOT DETECTED NOT DETECTED Final   Candida krusei NOT DETECTED NOT DETECTED Final   Candida  parapsilosis NOT DETECTED NOT DETECTED Final   Candida tropicalis NOT DETECTED NOT DETECTED Final  Urine culture     Status: None   Collection Time: 10/15/15  3:35 PM  Result Value Ref Range Status   Specimen Description URINE, RANDOM  Final   Special Requests NONE  Final   Culture NO GROWTH Performed at [facility]   Final   Report Status 10/16/2015 FINAL  Final  MRSA PCR Screening     Status: None   Collection Time: 10/15/15  6:32 PM  Result Value Ref Range Status   MRSA by PCR NEGATIVE NEGATIVE Final    Comment:        The GeneXpert MRSA Assay (FDA approved for NASAL specimens only), is one component of a comprehensive  MRSA colonization surveillance program. It is not intended to diagnose MRSA infection nor to guide or monitor treatment for MRSA infections.      Studies: Dg Chest Port 1 View  Result Date: 10/15/2015 CLINICAL DATA:  Productive cough, fever, shortness of breath. EXAM: PORTABLE CHEST 1 VIEW COMPARISON:  Radiograph Jun 26, 2015. FINDINGS: The heart size and mediastinal contours are within normal limits. No pneumothorax or pleural effusion is noted. Stable scarring is noted in both lung bases. No acute pulmonary disease is noted. The visualized skeletal structures are unremarkable. IMPRESSION: No acute cardiopulmonary abnormality seen. Electronically Signed   By: Marijo Conception, M.D.   On: 10/15/2015 15:33    Scheduled Meds: . budesonide (PULMICORT) nebulizer solution  0.25 mg Nebulization BID  . clonazePAM  1 mg Oral BID  . DULoxetine  60 mg Oral BID  . enoxaparin (LOVENOX) injection  40 mg Subcutaneous Q24H  . insulin aspart  0-5 Units Subcutaneous QHS  . insulin aspart  0-9 Units Subcutaneous TID WC  . insulin glargine  12 Units Subcutaneous QHS  . ipratropium-albuterol  3 mL Nebulization Q4H  . levofloxacin (LEVAQUIN) IV  750 mg Intravenous Q24H  . methylPREDNISolone (SOLU-MEDROL) injection  60 mg Intravenous Q6H  . mirtazapine  45 mg Oral QHS   . oxyCODONE  20 mg Oral Q12H  . risperiDONE  2 mg Oral QHS  . roflumilast  500 mcg Oral Daily  . senna-docusate  1 tablet Oral BID  . sodium chloride flush  3 mL Intravenous Q12H  . tiotropium  18 mcg Inhalation Daily  . vancomycin  1,250 mg Intravenous Q8H   Continuous Infusions: . sodium chloride 50 mL/hr at 10/17/15 0033    Assessment/Plan:  1. Acute on chronic hypoxic respiratory failure. Patient is on baseline 5 L of oxygen. 2. COPD exacerbation increase Solu-Medrol 60 mg daily 6 hours. Added budesonide nebulizers. Continue DuoNeb nebulizers. Continue Levaquin. Since chest x-ray negative for pneumonia.  3. Systemic inflammatory response syndrome. Likely vital signs abnormalities because of acute respiratory failure and COPD exacerbation. Unfortunately positive blood culture with staph species. Usually that's a contamination. Continue vancomycin for now. 4. Tachycardia- cardizem cd 5. Chronic pain- contniue medications 6. Type 2 diabetes- sliding scale.  Added low dose lantus 7. Anxiety/depression- continue medications  Code Status:     Code Status Orders        Start     Ordered   10/15/15 6010  Do not attempt resuscitation (DNR)  Continuous    Question Answer Comment  In the event of cardiac or respiratory ARREST Do not call a "code blue"   In the event of cardiac or respiratory ARREST Do not perform Intubation, CPR, defibrillation or ACLS   In the event of cardiac or respiratory ARREST Use medication by any route, position, wound care, and other measures to relive pain and suffering. May use oxygen, suction and manual treatment of airway obstruction as needed for comfort.      10/15/15 1643    Code Status History    Date Active Date Inactive Code Status Order ID Comments User Context   10/15/2015  4:43 PM 10/15/2015  4:43 PM Full Code [id number]  Lytle Butte, MD ED   06/26/2015 10:13 AM 06/28/2015  6:05 PM DNR [id number]  Bettey Costa, MD Inpatient   06/26/2015  6:05 AM  06/26/2015 10:13 AM Full Code [id number]  Saundra Shelling, MD Inpatient   06/08/2015 12:15 AM 06/10/2015  4:32 PM Full Code [id number]  Pavan  Pyreddy, MD Inpatient    Advance Directive Documentation   [place] Most Recent Value  Type of Advance Directive  Living will  Pre-existing out of facility DNR order (yellow form or pink MOST form)  No data  "MOST" Form in Place?  No data     Disposition Plan: Breathing will need to improve prior to disposition  Antibiotics:  Levaquin  Time spent: 26 minutes  Loletha Grayer  [employer]

## 2015-10-18 ENCOUNTER — Inpatient Hospital Stay: Payer: Medicaid Other

## 2015-10-18 DIAGNOSIS — J9621 Acute and chronic respiratory failure with hypoxia: Secondary | ICD-10-CM

## 2015-10-18 LAB — BASIC METABOLIC PANEL
Anion gap: 4 — ABNORMAL LOW (ref 5–15)
BUN: 9 mg/dL (ref 6–20)
CO2: 43 mmol/L — ABNORMAL HIGH (ref 22–32)
CREATININE: 0.45 mg/dL — AB (ref 0.61–1.24)
Calcium: 8.4 mg/dL — ABNORMAL LOW (ref 8.9–10.3)
Chloride: 94 mmol/L — ABNORMAL LOW (ref 101–111)
GFR calc Af Amer: >60 mL/min (ref >60)
GLUCOSE: 201 mg/dL — AB (ref 65–99)
POTASSIUM: 3.9 mmol/L (ref 3.5–5.1)
SODIUM: 141 mmol/L (ref 135–145)

## 2015-10-18 LAB — CREATININE, SERUM: CREATININE: 0.42 mg/dL — AB (ref 0.61–1.24)

## 2015-10-18 LAB — CULTURE, BLOOD (ROUTINE X 2)

## 2015-10-18 LAB — GLUCOSE, CAPILLARY
GLUCOSE-CAPILLARY: 184 mg/dL — AB (ref 65–99)
GLUCOSE-CAPILLARY: 246 mg/dL — AB (ref 65–99)
Glucose-Capillary: 164 mg/dL — ABNORMAL HIGH (ref 65–99)

## 2015-10-18 LAB — BRAIN NATRIURETIC PEPTIDE: B Natriuretic Peptide: 96 pg/mL (ref 0.0–100.0)

## 2015-10-18 LAB — BLOOD GAS, ARTERIAL
ACID-BASE EXCESS: 19.8 mmol/L — AB (ref 0.0–3.0)
BICARBONATE: 52.2 meq/L — AB (ref 21.0–28.0)
FIO2: 40
O2 SAT: 90.2 %
PATIENT TEMPERATURE: 37
PO2 ART: 65 mmHg — AB (ref 83.0–108.0)
pCO2 arterial: 106 mmHg (ref 32.0–48.0)
pH, Arterial: 7.3 — ABNORMAL LOW (ref 7.350–7.450)

## 2015-10-18 LAB — CBC
HEMATOCRIT: 42.9 % (ref 40.0–52.0)
Hemoglobin: 14.5 g/dL (ref 13.0–18.0)
MCH: 29.9 pg (ref 26.0–34.0)
MCHC: 33.9 g/dL (ref 32.0–36.0)
MCV: 88.2 fL (ref 80.0–100.0)
PLATELETS: 143 10*3/uL — AB (ref 150–440)
RBC: 4.86 MIL/uL (ref 4.40–5.90)
RDW: 14.3 % (ref 11.5–14.5)
WBC: 5.3 10*3/uL (ref 3.8–10.6)

## 2015-10-18 LAB — PROTIME-INR
INR: 0.98
PROTHROMBIN TIME: 13 s (ref 11.4–15.2)

## 2015-10-18 LAB — VANCOMYCIN, TROUGH: Vancomycin Tr: 12 ug/mL — ABNORMAL LOW (ref 15–20)

## 2015-10-18 LAB — TROPONIN I: Troponin I: <0.03 ng/mL (ref <0.03)

## 2015-10-18 LAB — PHOSPHORUS: PHOSPHORUS: 4.3 mg/dL (ref 2.5–4.6)

## 2015-10-18 LAB — MAGNESIUM: Magnesium: 1.8 mg/dL (ref 1.7–2.4)

## 2015-10-18 LAB — FIBRIN DERIVATIVES D-DIMER (ARMC ONLY): Fibrin derivatives D-dimer (ARMC): 249 (ref 0–499)

## 2015-10-18 MED ORDER — BUDESONIDE 0.5 MG/2ML IN SUSP
0.5000 mg | Freq: Two times a day (BID) | RESPIRATORY_TRACT | Status: DC
Start: 2015-10-18 — End: 2015-10-23
  Administered 2015-10-18 – 2015-10-23 (×10): 0.5 mg via RESPIRATORY_TRACT
  Filled 2015-10-18 (×10): qty 2

## 2015-10-18 MED ORDER — FAMOTIDINE IN NACL 20-0.9 MG/50ML-% IV SOLN
20.0000 mg | Freq: Two times a day (BID) | INTRAVENOUS | Status: DC
  Administered 2015-10-18 – 2015-10-20 (×5): 20 mg via INTRAVENOUS
  Filled 2015-10-18 (×5): qty 50

## 2015-10-18 MED ORDER — VANCOMYCIN HCL 10 G IV SOLR
1500.0000 mg | Freq: Three times a day (TID) | INTRAVENOUS | Status: DC
  Filled 2015-10-18 (×3): qty 1500

## 2015-10-18 MED ORDER — FUROSEMIDE 10 MG/ML IJ SOLN
80.0000 mg | Freq: Once | INTRAMUSCULAR | Status: AC
  Administered 2015-10-18: 80 mg via INTRAVENOUS
  Filled 2015-10-18: qty 8

## 2015-10-18 MED ORDER — LEVOFLOXACIN 500 MG PO TABS
750.0000 mg | ORAL_TABLET | Freq: Every day | ORAL | Status: DC
  Administered 2015-10-19 – 2015-10-22 (×4): 750 mg via ORAL
  Filled 2015-10-18 (×4): qty 2

## 2015-10-18 NOTE — Plan of Care (Signed)
Problem: Physical Regulation: Goal: Ability to maintain clinical measurements within normal limits will improve Outcome: Not Progressing Patient was transferred to ICU today from floor to be placed on bipap. Patient with unlabored breathing, O2 sats at goal, and alert and oriented on bipap. Patient did have trial off of bipap in afternoon but bipap had to replaced as patient grew lethargic and had critical CO2 of 106. During that time patient did also receive prn oxycodone for reported pain. After bipap was repalced, patient returned to alert and oriented status. RNs did give patient a brief break to eat dinner in evening, and even during that brief period, O2 sats dropped towards the end of it and patient had increased work of breathing.

## 2015-10-18 NOTE — Progress Notes (Signed)
Pharmacy Antibiotic Note  Craig Wright is a 50 y.o. male admitted on 10/15/2015 with pneumonia and possible bacteremia. Pharmacy has been consulted for vancomycin and levofloxacin dosing.  This is day #4 of antibiotics.  Plan: After discussion with Dr. Earleen Newport, will d/c vancomycin with CNS 1/2 BCx which is likely a contaminant.   Continue levofloxacin 750 mg q24h  Height: 6' (182.9 cm) Weight: 199 lb 4.7 oz (90.4 kg) IBW/kg (Calculated) : 77.6  Temp (24hrs), Avg:97.8 F (36.6 C), Min:97.2 F (36.2 C), Max:98.6 F (37 C)   Recent Labs Lab 10/15/15 1353 10/15/15 1926 10/18/15 0042 10/18/15 0351 10/18/15 1222  WBC 4.6  --   --   --  5.3  CREATININE 0.62  --   --  0.42* 0.45*  LATICACIDVEN 1.5 1.2  --   --   --   VANCOTROUGH  --   --  12*  --   --     Estimated Creatinine Clearance: 119.9 mL/min (by C-G formula based on SCr of 0.8 mg/dL).    No Known Allergies  Antimicrobials this admission: levofloxacin 8/23 >>  vancomycin 8/22 >> 8/23 then resumed 8/23 >> 8/25 Piperacillin/tazobactam 8/22 >> 8/24  Dose adjustments this admission:  Microbiology results: 8/23 BCx: CNS 1/2 8/22 UCx: No growth  8/25 MRSA PCR: Negative  Thank you for allowing pharmacy to be a part of this patient's care.  Napoleon Form, PharmD, BCPS Clinical Pharmacist 10/18/2015 2:23 PM

## 2015-10-18 NOTE — Consult Note (Signed)
[facility] Consultation     ASSESSMENT/PLAN   50 yo male smoker with end stage emphysema, admitted with AECOPD. Now transferred to ICU with pulm edema and resp distress, placed on bipap.    PULMONARY A:End stage emphysema with AECOPD, acute on chronic hypoxic resp failure.  Acute bronchitis.  Nicotine abuse.  Sleep apnea.  P:   --Check ABG, cbc, trops, BNP.  --Continue diuresis.  --Daliresp, spiriva, nebs, levaquin.  --Steroids IV  --Discussed smoking cessation.  --Bipap qhs and prn.   CARDIOVASCULAR A: Essential Hypertension Tachycardia P:  --Continue cardizem.   RENAL A:  --  GASTROINTESTINAL A: Gi prophylaxis.   HEMATOLOGIC A: --  INFECTIOUS A: Acute bronchitis-- CNS vanco resistant-- suspect this is contaminant.  --Levaquin.   Micro/culture results:  BCx2 staph epi 1/2 bottles.  UC negative.  Sputum --  Antibiotics: Levaquin 10/16/15>>  ENDOCRINE A:  Hyperglycemia.  P:   --Continue SSI.  --Currently on steroids.   NEUROLOGIC A: --  MAJOR EVENTS/TEST RESULTS: 10/17/16; transferred to ICU, on bipap, started diuresis. Code status changed to full code.   Best Practices  DVT Prophylaxis: Enoxaparin.  GI Prophylaxis: famotidine.    --------------------------------------- Advance Care Planning:   --Discussed patients condition and current prognosis. He knows that he has severe disease and that it may be difficult to wean him from a ventilator should he be intubated. He expressed that if it were to come to that, he would want to be made comfortable. Therefore will change status back to pt's previous code status of DNR. This would be appropriate given the patient's diagnosis of end stage COPD.  30 min spent in discussion.  ---------------------------------------   Name: CERRONE DEBOLD MRN: [id number] DOB: 1965-10-09    ADMISSION DATE:  10/15/2015 CONSULTATION DATE:  10/18/15  REFERRING MD :  Dr. Earleen Newport.   CHIEF  COMPLAINT: Dyspnea.    HISTORY OF PRESENT ILLNESS:    SABEN DONIGAN is a 50 y.o. male with COPD on 5 L nasal cannula oxygen at all times presented to ED on 8/22 with worsening shortness of breath for 1 day. Also associated with fevers and chills and sweats. He was diagnosed with pneumonia and, and admitted to the hospital, started on vancomycin and Zosyn. Patient was started on steroids for COPD exacerbation, as well as nebulizers and Levaquin.subsequently, early this morning, th, the patient was started on BiPap. He was given a dose of Lasix, code status was changed to full code.   MRSA PCR has been negative,UA and urine culture negative, blood cultures positive for his staph epi in 1 out of 2 bottles.the patient's blood glucose has been elevated, review ofchest x-ray imagesfrom 8/25  Shows hyperinflation consistent with emphysema.   He was seen by Dr. Mortimer Fries in the past, about 3 months ago inpt at that time it was noted that he had severe, end stage emphysema, and noted to be DNR/DNI. In the past he was he has been on chronic prednisone at 20 mg daily, this was stopped after a recent hospitalization. He has a history of OSA on Bi-level PAP 12-13/6 cm w/ 5L O2 bleed in. Heated humidity for nasal dryness. Mask: Fisher Paykel Simplus  PAST MEDICAL HISTORY :  Past Medical History:  Diagnosis Date  . Anxiety disorder   . Asthma   . COPD (chronic obstructive pulmonary disease) ([place])   . Diabetes mellitus without complication ([place])   . Hypertension    Past Surgical History:  Procedure Laterality Date  .  KNEE SURGERY Left   . ORTHOPEDIC SURGERY     multiple  . SKIN GRAFT    . TOTAL HIP ARTHROPLASTY Left    Prior to Admission medications   Medication Sig Start Date End Date Taking? Authorizing Provider  albuterol-ipratropium (COMBIVENT) 18-103 MCG/ACT inhaler Inhale 2 puffs into the lungs every 4 (four) hours as needed for wheezing or shortness of breath.   Yes Historical Provider, MD    clonazePAM (KLONOPIN) 1 MG tablet Take 1 mg by mouth 2 (two) times daily.   Yes Historical Provider, MD  DULoxetine (CYMBALTA) 60 MG capsule Take 60 mg by mouth 2 (two) times daily.    Yes Historical Provider, MD  furosemide (LASIX) 20 MG tablet Take 40 mg by mouth daily.    Yes Historical Provider, MD  metFORMIN (GLUMETZA) 500 MG (MOD) 24 hr tablet Take 500 mg by mouth daily with breakfast. Reported on 06/26/2015   Yes Historical Provider, MD  mirtazapine (REMERON) 45 MG tablet Take 45 mg by mouth at bedtime.   Yes Historical Provider, MD  oxyCODONE (OXYCONTIN) 20 mg 12 hr tablet Take 20 mg by mouth every 12 (twelve) hours.   Yes Historical Provider, MD  Oxycodone HCl 10 MG TABS Take 10 mg by mouth every 6 (six) hours as needed.   Yes Historical Provider, MD  risperiDONE (RISPERDAL) 2 MG tablet Take 2 mg by mouth at bedtime.   Yes Historical Provider, MD  roflumilast (DALIRESP) 500 MCG TABS tablet Take 500 mcg by mouth daily.   Yes Historical Provider, MD  tiotropium (SPIRIVA) 18 MCG inhalation capsule Place 18 mcg into inhaler and inhale daily.   Yes Historical Provider, MD  albuterol (PROVENTIL HFA;VENTOLIN HFA) 108 (90 Base) MCG/ACT inhaler Inhale 2 puffs into the lungs every 6 (six) hours as needed for wheezing or shortness of breath.    Historical Provider, MD  mometasone-formoterol (DULERA) 100-5 MCG/ACT AERO Inhale 2 puffs into the lungs 2 (two) times daily. Patient not taking: Reported on 10/15/2015 06/28/15   Fritzi Mandes, MD  predniSONE (DELTASONE) 10 MG tablet Take 4 tablets (40 mg total) by mouth daily with breakfast. Taper by 10 mg daily then stop 06/29/15   Fritzi Mandes, MD   No Known Allergies  FAMILY HISTORY:  Family History  Problem Relation Age of Onset  . Cervical cancer Mother   . Colon cancer Father    SOCIAL HISTORY:  reports that he has been smoking.  He has never used smokeless tobacco. He reports that he drinks alcohol. He reports that he does not use drugs.  REVIEW OF  SYSTEMS:   Could not provide, pt on bipap.    VITAL SIGNS: Temp:  [97.2 F (36.2 C)-98.6 F (37 C)] 97.2 F (36.2 C) (08/25 1026) Pulse Rate:  [106-125] 113 (08/25 1117) Resp:  [17-29] 19 (08/25 1117) BP: (137-164)/(83-105) 137/98 (08/25 1026) SpO2:  [89 %-100 %] 93 % (08/25 1117) FiO2 (%):  [40 %-100 %] 50 % (08/25 1117) Weight:  [199 lb 4.7 oz (90.4 kg)] 199 lb 4.7 oz (90.4 kg) (08/25 1026) HEMODYNAMICS:   VENTILATOR SETTINGS: FiO2 (%):  [40 %-100 %] 50 % INTAKE / OUTPUT:  Intake/Output Summary (Last 24 hours) at 10/18/15 1138 Last data filed at 10/18/15 1100  Gross per 24 hour  Intake              440 ml  Output             2400 ml  Net            -  1960 ml    Physical Examination:   VS: BP (!) 137/98 (BP Location: Left Arm)   Pulse (!) 113   Temp 97.2 F (36.2 C) (Axillary)   Resp 19   Ht 6' (1.829 m)   Wt 199 lb 4.7 oz (90.4 kg)   SpO2 93%   BMI 27.03 kg/m   General Appearance: No distress  Neuro:without focal findings, mental status reduced but arousable and oriented x 3.  HEENT: PERRLA, EOM intact, no ptosis, no other lesions noticed;  Pulmonary: normal breath sounds, but decreased bilaterally.  CardiovascularNormal S1,S2.  No m/r/g.    Abdomen: Benign, Soft, non-tender,  Renal:  No costovertebral tenderness  GU:  Not performed at this time. Endoc: No evident thyromegaly, no signs of acromegaly. Skin:   warm, no rashes, no ecchymosis  Extremities: normal, no cyanosis, clubbing, no edema, warm with normal capillary refill.    LABS: Reviewed   LABORATORY PANEL:   CBC  Recent Labs Lab 10/15/15 1353  WBC 4.6  HGB 14.9  HCT 44.1  PLT 152    Chemistries   Recent Labs Lab 10/15/15 1353 10/18/15 0351  NA 133*  --   K 3.4*  --   CL 90*  --   CO2 36*  --   GLUCOSE 173*  --   BUN 6  --   CREATININE 0.62 0.42*  CALCIUM 8.5*  --   AST 89*  --   ALT 195*  --   ALKPHOS 53  --   BILITOT 0.5  --      Recent Labs Lab 10/16/15 2054  10/17/15 0736 10/17/15 1115 10/17/15 1644 10/17/15 2116 10/18/15 0757  GLUCAP 224* 175* 276* 106* 240* 184*   No results for input(s): PHART, PCO2ART, PO2ART in the last 168 hours.  Recent Labs Lab 10/15/15 1353  AST 89*  ALT 195*  ALKPHOS 53  BILITOT 0.5  ALBUMIN 3.4*    Cardiac Enzymes  Recent Labs Lab 10/15/15 1353  TROPONINI <0.03    RADIOLOGY:  Dg Chest 1 View  Result Date: 10/18/2015 CLINICAL DATA:  Acute on chronic respiratory failure with hypoxemia. COPD. EXAM: CHEST 1 VIEW COMPARISON:  10/15/2015 FINDINGS: The heart size and mediastinal contours are within normal limits. Pulmonary hyperinflation again demonstrated. No evidence of pulmonary infiltrate or edema. No evidence of pneumothorax or pleural effusion. Surgical clips again seen within the right axilla in skin staples in the right chest wall. IMPRESSION: Stable pulmonary hyperinflation, consistent with history of COPD. No acute findings. Electronically Signed   By: Earle Gell M.D.   On: 10/18/2015 09:22       --Marda Stalker, MD.  Board Certified in Internal Medicine, Pulmonary Medicine, [place], and Sleep Medicine.  ICU Pager [phone] [place] Office Number: [phone]  Patricia Pesa, M.D.  Vilinda Boehringer, M.D.  Merton Border, M.D   10/18/2015, 11:38 AM  [place].  I have personally obtained a history, examined the patient, evaluated laboratory and imaging results, formulated the assessment and plan and placed orders. The Patient requires high complexity decision making for assessment and support, frequent evaluation and titration of therapies, application of advanced monitoring technologies and extensive interpretation of multiple databases. The patient has critical illness that could lead imminently to failure of 1 or more organ systems and requires the highest level of physician preparedness to intervene.  Critical Care Time devoted  to patient care services described in this note is 45 minutes and is exclusive of time  spent in procedures or other billed services.

## 2015-10-18 NOTE — Progress Notes (Signed)
PHARMACIST - PHYSICIAN COMMUNICATION DR:   Earleen Newport CONCERNING: Antibiotic IV to Oral Route Change Policy  RECOMMENDATION: This patient is receiving Levaquin by the intravenous route.  Based on criteria approved by the Pharmacy and Therapeutics Committee, the antibiotic(s) is/are being converted to the equivalent oral dose form(s).   DESCRIPTION: These criteria include:  Patient being treated for a respiratory tract infection, urinary tract infection, cellulitis or clostridium difficile associated diarrhea if on metronidazole  The patient is not neutropenic and does not exhibit a GI malabsorption state  The patient is eating (either orally or via tube) and/or has been taking other orally administered medications for a least 24 hours  The patient is improving clinically and has a Tmax < 100.5  If you have questions about this conversion, please contact the Pharmacy Department  '[]'$   [phone] )  [facility] '[x]'$   [phone] )  [facility] '[]'$   [phone] )  [facility] '[]'$   [phone] )  [facility] '[]'$   [phone] )  [place], PharmD Clinical Pharmacist

## 2015-10-18 NOTE — Progress Notes (Signed)
Pt noted with increased resp effort, pt unable to maintain sats on nasal cannula at 6 lpm, respiratory therapy in the room, Dr. Earleen Newport called, pt to be placed on BIPAP, Dr. Earleen Newport notified family, pt to be transferred to ICU

## 2015-10-18 NOTE — Progress Notes (Signed)
Notified E Link (E Link RN answered) about patient critical value of 106 CO2 on ABG. ABG value on nasal cannula at 5 Liters (patient's home dosage). Patient had been taken off bipap just before 14:00 per patient request, patient was alert and oriented, unlabored breathing at the time. While patient on nasal cannula, prn oxycodone medication given for patient's reported pain. While awaiting ABG results patient already placed back on bipap (see vital sign flowsheet) as patient had become lethargic and harder to arouse. No new orders at this time. Continuing patient on bipap.

## 2015-10-18 NOTE — Progress Notes (Signed)
Pt transferred to ICU per orders to room ICU20, Mother [name] notified of transfer.

## 2015-10-18 NOTE — Progress Notes (Signed)
   10/18/15 1503  BiPAP/CPAP/SIPAP  BiPAP/CPAP/SIPAP Pt Type Adult  Mask Type Full face mask  Mask Size Large  Set Rate 10 breaths/min  Respiratory Rate 16 breaths/min  IPAP 18 cmH20  EPAP 6 cmH2O  Oxygen Percent 50 %  Minute Ventilation 7.3  Leak 15  Peak Inspiratory Pressure (PIP) 19  Tidal Volume (Vt) 495  BiPAP/CPAP/SIPAP BiPAP  Press High Alarm 25 cmH2O  Press Low Alarm 2 cmH2O  BiPAP/CPAP /SiPAP Vitals  Pulse Rate 99  Resp 15  SpO2 95 %  Placed patient back on bipap per ABG results.

## 2015-10-18 NOTE — Progress Notes (Signed)
Patient ID: Craig Wright, male   DOB: October 10, 1965, 50 y.o.   MRN: [id number]  [facility] PROGRESS NOTE  LYDIA MENG NUU:[id number] DOB: 1965-06-23 DOA: 10/15/2015 PCP: No PCP Per Patient  HPI/Subjective: Patient with worsening shortness of breath and working to breath. This worsened overnight. Unable to bring up phlegm.  Objective: Vitals:   10/17/15 2032 10/18/15 0406  BP: (!) 154/97 (!) 164/96  Pulse: (!) 124 (!) 125  Resp: 17 17  Temp: 97.7 F (36.5 C) 97.7 F (36.5 C)    Filed Weights   10/15/15 1348  Weight: 87.5 kg (193 lb)    ROS: Review of Systems  Constitutional: Negative for chills and fever.  Eyes: Negative for blurred vision.  Respiratory: Positive for cough, shortness of breath and wheezing.   Cardiovascular: Negative for chest pain.  Gastrointestinal: Negative for abdominal pain, constipation, diarrhea, nausea and vomiting.  Genitourinary: Negative for dysuria.  Musculoskeletal: Negative for joint pain.  Neurological: Negative for dizziness and headaches.   Exam: Physical Exam  Constitutional: He is oriented to person, place, and time.  HENT:  Nose: No mucosal edema.  Mouth/Throat: No oropharyngeal exudate or posterior oropharyngeal edema.  Eyes: Conjunctivae, EOM and lids are normal. Pupils are equal, round, and reactive to light.  Neck: No JVD present. Carotid bruit is not present. No edema present. No thyroid mass and no thyromegaly present.  Cardiovascular: S1 normal and S2 normal.  Exam reveals no gallop.   Murmur heard.  Systolic murmur is present with a grade of 2/6  Pulses:      Dorsalis pedis pulses are 2+ on the right side, and 2+ on the left side.  Respiratory: Accessory muscle usage present. He is in respiratory distress. He has decreased breath sounds in the right upper field, the right middle field, the right lower field, the left upper field, the left middle field and the left lower field. He has wheezes in the right upper  field, the right middle field, the right lower field, the left upper field, the left middle field and the left lower field. He has no rhonchi. He has no rales.  GI: Soft. Bowel sounds are normal. There is no tenderness.  Musculoskeletal:       Right ankle: He exhibits swelling.       Left ankle: He exhibits swelling.  Lymphadenopathy:    He has no cervical adenopathy.  Neurological: He is alert and oriented to person, place, and time. No cranial nerve deficit.  Skin: Skin is warm. No rash noted. Nails show no clubbing.  Psychiatric: He has a normal mood and affect.      Data Reviewed: Basic Metabolic Panel:  Recent Labs Lab 10/15/15 1353 10/18/15 0351  NA 133*  --   K 3.4*  --   CL 90*  --   CO2 36*  --   GLUCOSE 173*  --   BUN 6  --   CREATININE 0.62 0.42*  CALCIUM 8.5*  --    Liver Function Tests:  Recent Labs Lab 10/15/15 1353  AST 89*  ALT 195*  ALKPHOS 53  BILITOT 0.5  PROT 6.9  ALBUMIN 3.4*    Recent Labs Lab 10/15/15 1353  LIPASE 16   CBC:  Recent Labs Lab 10/15/15 1353  WBC 4.6  NEUTROABS 3.0  HGB 14.9  HCT 44.1  MCV 88.3  PLT 152   Cardiac Enzymes:  Recent Labs Lab 10/15/15 1353  TROPONINI <0.03   BNP (last 3 results)  Recent  Labs  06/26/15 0232  BNP 21.0    CBG:  Recent Labs Lab 10/17/15 0736 10/17/15 1115 10/17/15 1644 10/17/15 2116 10/18/15 0757  GLUCAP 175* 276* 106* 240* 184*    Recent Results (from the past 240 hour(s))  Blood Culture (routine x 2)     Status: None (Preliminary result)   Collection Time: 10/15/15  1:53 PM  Result Value Ref Range Status   Specimen Description BLOOD RIGHT WRIST  Final   Special Requests BOTTLES DRAWN AEROBIC AND ANAEROBIC [place]  Final   Culture NO GROWTH 3 DAYS  Final   Report Status PENDING  Incomplete  Blood Culture (routine x 2)     Status: None (Preliminary result)   Collection Time: 10/15/15  1:53 PM  Result Value Ref Range Status   Specimen Description BLOOD  RIGHT ASSIST CONTROL  Final   Special Requests BOTTLES DRAWN AEROBIC AND ANAEROBIC 2CCAERO,5CCANA  Final   Culture  Setup Time   Final    GRAM POSITIVE COCCI AEROBIC BOTTLE ONLY CRITICAL RESULT CALLED TO, READ BACK BY AND VERIFIED WITHVioleta Gelinas [place] AT 1535 10/16/15 MSS. CONFIRMED BY QSD Organism ID to follow    Culture   Final    TOO YOUNG TO READ Performed at [facility]    Report Status PENDING  Incomplete  Blood Culture ID Panel (Reflexed)     Status: Abnormal   Collection Time: 10/15/15  1:53 PM  Result Value Ref Range Status   Enterococcus species NOT DETECTED NOT DETECTED Final   Vancomycin resistance NOT DETECTED NOT DETECTED Final   Listeria monocytogenes NOT DETECTED NOT DETECTED Final   Staphylococcus species DETECTED (A) NOT DETECTED Final    Comment: CRITICAL RESULT CALLED TO, READ BACK BY AND VERIFIED WITHVioleta Gelinas [place] AT 6834 10/16/15 MSS.    Staphylococcus aureus NOT DETECTED NOT DETECTED Final   Methicillin resistance DETECTED (A) NOT DETECTED Final    Comment: CRITICAL RESULT CALLED TO, READ BACK BY AND VERIFIED WITHVioleta Gelinas [place] AT 1962 10/16/15 MSS.    Streptococcus species NOT DETECTED NOT DETECTED Final   Streptococcus agalactiae NOT DETECTED NOT DETECTED Final   Streptococcus pneumoniae NOT DETECTED NOT DETECTED Final   Streptococcus pyogenes NOT DETECTED NOT DETECTED Final   Acinetobacter baumannii NOT DETECTED NOT DETECTED Final   Enterobacteriaceae species NOT DETECTED NOT DETECTED Final   Enterobacter cloacae complex NOT DETECTED NOT DETECTED Final   Escherichia coli NOT DETECTED NOT DETECTED Final   Klebsiella oxytoca NOT DETECTED NOT DETECTED Final   Klebsiella pneumoniae NOT DETECTED NOT DETECTED Final   Proteus species NOT DETECTED NOT DETECTED Final   Serratia marcescens NOT DETECTED NOT DETECTED Final   Carbapenem resistance NOT DETECTED NOT DETECTED Final   Haemophilus influenzae NOT DETECTED NOT DETECTED Final    Neisseria meningitidis NOT DETECTED NOT DETECTED Final   Pseudomonas aeruginosa NOT DETECTED NOT DETECTED Final   Candida albicans NOT DETECTED NOT DETECTED Final   Candida glabrata NOT DETECTED NOT DETECTED Final   Candida krusei NOT DETECTED NOT DETECTED Final   Candida parapsilosis NOT DETECTED NOT DETECTED Final   Candida tropicalis NOT DETECTED NOT DETECTED Final  Urine culture     Status: None   Collection Time: 10/15/15  3:35 PM  Result Value Ref Range Status   Specimen Description URINE, RANDOM  Final   Special Requests NONE  Final   Culture NO GROWTH Performed at [facility]   Final   Report Status 10/16/2015 FINAL  Final  MRSA PCR Screening     Status: None   Collection Time: 10/15/15  6:32 PM  Result Value Ref Range Status   MRSA by PCR NEGATIVE NEGATIVE Final    Comment:        The GeneXpert MRSA Assay (FDA approved for NASAL specimens only), is one component of a comprehensive MRSA colonization surveillance program. It is not intended to diagnose MRSA infection nor to guide or monitor treatment for MRSA infections.       Scheduled Meds: . budesonide (PULMICORT) nebulizer solution  0.25 mg Nebulization BID  . clonazePAM  1 mg Oral BID  . diltiazem  120 mg Oral QHS  . DULoxetine  60 mg Oral BID  . enoxaparin (LOVENOX) injection  40 mg Subcutaneous Q24H  . furosemide  80 mg Intravenous Once  . insulin aspart  0-5 Units Subcutaneous QHS  . insulin aspart  0-9 Units Subcutaneous TID WC  . insulin glargine  12 Units Subcutaneous QHS  . ipratropium-albuterol  3 mL Nebulization Q4H  . levofloxacin (LEVAQUIN) IV  750 mg Intravenous Q24H  . methylPREDNISolone (SOLU-MEDROL) injection  60 mg Intravenous Q6H  . mirtazapine  45 mg Oral QHS  . oxyCODONE  20 mg Oral Q12H  . risperiDONE  2 mg Oral QHS  . roflumilast  500 mcg Oral Daily  . senna-docusate  1 tablet Oral BID  . sodium chloride flush  3 mL Intravenous Q12H  . tiotropium  18 mcg Inhalation Daily   . vancomycin  1,250 mg Intravenous Q8H    Assessment/Plan:  1. Acute on chronic hypoxic respiratory failure. With respiratory distress, will transfer to icu for bipap. Patient reversed code status to full code. I confirmed this with his mother. Case discussed with Critical Care Specialist. One dose lasix ordered. 2. COPD exacerbation. Solu-Medrol 60 mg daily 6 hours. Added budesonide nebulizers. Continue DuoNeb nebulizers. Continue Levaquin. Since chest x-ray negative for pneumonia. Repeat cxr now. 3. Systemic inflammatory response syndrome. Likely vital signs abnormalities because of acute respiratory failure and COPD exacerbation. Unfortunately positive blood culture with staph species. Usually that's a contamination. Continue vancomycin for now. 4. Tachycardia- cardizem cd 5. Chronic pain- contniue medications 6. Type 2 diabetes- sliding scale.  Added low dose lantus 7. Anxiety/depression- continue medications  Code Status: Patient reversed code status to full code    Code Status Orders        Start     Ordered   10/15/15 1644  Do not attempt resuscitation (DNR)  Continuous    Question Answer Comment  In the event of cardiac or respiratory ARREST Do not call a "code blue"   In the event of cardiac or respiratory ARREST Do not perform Intubation, CPR, defibrillation or ACLS   In the event of cardiac or respiratory ARREST Use medication by any route, position, wound care, and other measures to relive pain and suffering. May use oxygen, suction and manual treatment of airway obstruction as needed for comfort.      10/15/15 1643    Code Status History    Date Active Date Inactive Code Status Order ID Comments User Context   10/15/2015  4:43 PM 10/15/2015  4:43 PM Full Code [id number]  Lytle Butte, MD ED   06/26/2015 10:13 AM 06/28/2015  6:05 PM DNR [id number]  Bettey Costa, MD Inpatient   06/26/2015  6:05 AM 06/26/2015 10:13 AM Full Code [id number]  Saundra Shelling, MD Inpatient   06/08/2015 12:15  AM 06/10/2015  4:32 PM Full  Code [id number]  Saundra Shelling, MD Inpatient    Advance Directive Documentation   Flowsheet Row Most Recent Value  Type of Advance Directive  Living will  Pre-existing out of facility DNR order (yellow form or pink MOST form)  No data  "MOST" Form in Place?  No data     Disposition Plan: TBD  Antibiotics:  Levaquin  Vancomycin  Time spent: 35 minutes, critical care time.  Case discused with Patient, nursing, patients mother and critical care specialist  [place], [facility]

## 2015-10-18 NOTE — Progress Notes (Signed)
Pharmacy Antibiotic Note  Craig Wright is a 50 y.o. male admitted on 10/15/2015 with pneumonia and possible bacteremia. Pharmacy has been consulted for vancomycin and levofloxacin dosing.  This is day #4 of antibiotics.  Plan: Vancomycin trough = 12 mcg/mL on 1250 mg IV q8h.  Will increase dose to 1500 mg IV q8h Goal vancomycin trough 15-20 mcg/mL Vancomycin trough ordered for 8/26 @ 2330 which is prior to 5th dose and should represent steady state.  Continue levofloxacin 750 mg IV q24h  Height: 6' (182.9 cm) Weight: 193 lb (87.5 kg) IBW/kg (Calculated) : 77.6  Temp (24hrs), Avg:97.7 F (36.5 C), Min:97.6 F (36.4 C), Max:97.7 F (36.5 C)   Recent Labs Lab 10/15/15 1353 10/15/15 1926 10/18/15 0042 10/18/15 0351  WBC 4.6  --   --   --   CREATININE 0.62  --   --  0.42*  LATICACIDVEN 1.5 1.2  --   --   VANCOTROUGH  --   --  12*  --     Estimated Creatinine Clearance: 119.9 mL/min (by C-G formula based on SCr of 0.8 mg/dL).    No Known Allergies  Antimicrobials this admission: levofloxacin 8/23 >>  vancomycin 8/22 >> 8/23 then resumed 8/23 >> Piperacillin/tazobactam 8/22 >> 8/24  Dose adjustments this admission:  Microbiology results: 8/23 BCx: GPC, staph species 8/22 UCx: No growth  8/25 MRSA PCR: Negative  Thank you for allowing pharmacy to be a part of this patient's care.  Lenis Noon, PharmD, BCPS Clinical Pharmacist 10/18/2015 9:22 AM

## 2015-10-19 ENCOUNTER — Inpatient Hospital Stay: Payer: Medicaid Other

## 2015-10-19 LAB — BASIC METABOLIC PANEL
Anion gap: 3 — ABNORMAL LOW (ref 5–15)
Anion gap: 4 — ABNORMAL LOW (ref 5–15)
BUN: 15 mg/dL (ref 6–20)
BUN: 12 mg/dL (ref 6–20)
CALCIUM: 8.5 mg/dL — AB (ref 8.9–10.3)
CHLORIDE: 91 mmol/L — AB (ref 101–111)
CO2: 44 mmol/L — ABNORMAL HIGH (ref 22–32)
CO2: 45 mmol/L — AB (ref 22–32)
CREATININE: 0.54 mg/dL — AB (ref 0.61–1.24)
Calcium: 8.4 mg/dL — ABNORMAL LOW (ref 8.9–10.3)
Chloride: 89 mmol/L — ABNORMAL LOW (ref 101–111)
Creatinine, Ser: 0.56 mg/dL — ABNORMAL LOW (ref 0.61–1.24)
GFR calc Af Amer: >60 mL/min (ref >60)
GFR calc Af Amer: >60 mL/min (ref >60)
GFR calc non Af Amer: >60 mL/min (ref >60)
GLUCOSE: 200 mg/dL — AB (ref 65–99)
Glucose, Bld: 305 mg/dL — ABNORMAL HIGH (ref 65–99)
POTASSIUM: 4.6 mmol/L (ref 3.5–5.1)
Potassium: 4.2 mmol/L (ref 3.5–5.1)
SODIUM: 137 mmol/L (ref 135–145)
Sodium: 139 mmol/L (ref 135–145)

## 2015-10-19 LAB — CBC
HEMATOCRIT: 40.8 % (ref 40.0–52.0)
HEMOGLOBIN: 13.7 g/dL (ref 13.0–18.0)
MCH: 29.6 pg (ref 26.0–34.0)
MCHC: 33.7 g/dL (ref 32.0–36.0)
MCV: 88 fL (ref 80.0–100.0)
Platelets: 138 10*3/uL — ABNORMAL LOW (ref 150–440)
RBC: 4.64 MIL/uL (ref 4.40–5.90)
RDW: 14.1 % (ref 11.5–14.5)
WBC: 4.2 10*3/uL (ref 3.8–10.6)

## 2015-10-19 LAB — GLUCOSE, CAPILLARY
GLUCOSE-CAPILLARY: 198 mg/dL — AB (ref 65–99)
GLUCOSE-CAPILLARY: 214 mg/dL — AB (ref 65–99)
GLUCOSE-CAPILLARY: 196 mg/dL — AB (ref 65–99)
GLUCOSE-CAPILLARY: 198 mg/dL — AB (ref 65–99)

## 2015-10-19 LAB — TROPONIN I: Troponin I: <0.03 ng/mL (ref <0.03)

## 2015-10-19 MED ORDER — DILTIAZEM HCL 60 MG PO TABS
60.0000 mg | ORAL_TABLET | Freq: Three times a day (TID) | ORAL | Status: DC
Start: 2015-10-19 — End: 2015-10-21
  Administered 2015-10-19 – 2015-10-21 (×7): 60 mg via ORAL
  Filled 2015-10-19 (×2): qty 1
  Filled 2015-10-19 (×2): qty 2
  Filled 2015-10-19 (×3): qty 1

## 2015-10-19 MED ORDER — GUAIFENESIN ER 600 MG PO TB12
600.0000 mg | ORAL_TABLET | Freq: Two times a day (BID) | ORAL | Status: DC
  Administered 2015-10-19 – 2015-10-20 (×4): 600 mg via ORAL
  Filled 2015-10-19 (×5): qty 1

## 2015-10-19 MED ORDER — ORAL CARE MOUTH RINSE
15.0000 mL | Freq: Two times a day (BID) | OROMUCOSAL | Status: DC
  Administered 2015-10-20: 15 mL via OROMUCOSAL

## 2015-10-19 MED ORDER — CHLORHEXIDINE GLUCONATE 0.12 % MT SOLN
15.0000 mL | Freq: Two times a day (BID) | OROMUCOSAL | Status: DC
  Administered 2015-10-19 – 2015-10-20 (×2): 15 mL via OROMUCOSAL
  Filled 2015-10-19 (×3): qty 15

## 2015-10-19 NOTE — Progress Notes (Signed)
Advance Care Planning Note:   Noted that patient was changed back to full code today  8/22: DNR on admission. 8/25: Made Full code> transferred to ICU for resp distress.  8/25: Made DNR. Managed on bipap.  8/26: Back to full code.   Currently I explained the nature of the patient's terminal/ end stage COPD. He does not appear to be fully understanding of his situation. He expressed that he would like to be made comfortable, and does not want a lot of things done, but then lapses repeatedly back to the idea that he would like to be returned to his assisted living. After multiple attempts trying to help him understand that returning to his assisted living would not be possible after intubation, it became clear that his mental status was clouded.   I called the patient's mother to see if she could help him understand, but she noted that he has been very forgetful and did not remember that he had been in to see him earlier. His mother will be coming in to visit tomorrow and discuss further with him, and I expressed that I could help answer any question that they may have. In the meantime his code status will remain full code.  Will consult palliative to help with discussions. Will transfer pt to ICU service as he is currently full code on bipap.   -Deep Lael Wetherbee, M.D. 10/19/2015 30 min spent in discussion.

## 2015-10-19 NOTE — Progress Notes (Signed)
PULMONARY / CRITICAL CARE MEDICINE   Name: Craig Wright MRN: [id number] DOB: 02-Aug-1965    ADMISSION DATE:  10/15/2015    Patient profile: 50 yo male smoker with end stage emphysema, admitted with AECOPD. Now transferred to ICU with pulm edema and resp distress, placed on bipap  SUBJECTIVE:  Patient was on BiPAP overnight.  Patient had an uneventful night.  VITAL SIGNS: BP 124/80 (BP Location: Right Arm)   Pulse (!) 109   Temp 98.8 F (37.1 C) (Axillary)   Resp (!) 22   Ht 6' (1.829 m)   Wt 199 lb 4.7 oz (90.4 kg)   SpO2 96%   BMI 27.03 kg/m   HEMODYNAMICS:    VENTILATOR SETTINGS: FiO2 (%):  [50 %-55 %] 50 %  INTAKE / OUTPUT: I/O last 3 completed shifts: In: 9 [P.O.:680; IV Piggyback:350] Out: 3500 [Urine:3500]  PHYSICAL EXAMINATION: General:  Middle aged man , found on BiPAP in no acute distress Neuro:  Awake, alert, oriented, no focal deficits HEENT:  Atraumatic, normocephalic, no JVD appreciated Cardiovascular:  S1S2, regular, no MRG noted Lungs: decreased breath sounds bilaterally, no wheezes, crackles, rhonchi noted. Abdomen:  Soft, non tentender, good bowel sounds Musculoskeletal: no inflammation/deformity noted Skin:  Grossly intact  LABS:  BMET  Recent Labs Lab 10/15/15 1353 10/18/15 0351 10/18/15 1222 10/19/15 0011  NA 133*  --  141 139  K 3.4*  --  3.9 4.2  CL 90*  --  94* 91*  CO2 36*  --  43* 45*  BUN 6  --  9 12  CREATININE 0.62 0.42* 0.45* 0.54*  GLUCOSE 173*  --  201* 200*    Electrolytes  Recent Labs Lab 10/15/15 1353 10/18/15 1222 10/19/15 0011  CALCIUM 8.5* 8.4* 8.4*  MG  --  1.8  --   PHOS  --  4.3  --     CBC  Recent Labs Lab 10/15/15 1353 10/18/15 1222 10/19/15 0011  WBC 4.6 5.3 4.2  HGB 14.9 14.5 13.7  HCT 44.1 42.9 40.8  PLT 152 143* 138*    Coag's  Recent Labs Lab 10/15/15 1353 10/18/15 1222  APTT 33  --   INR 1.01 0.98    Sepsis Markers  Recent Labs Lab 10/15/15 1353 10/15/15 1926   LATICACIDVEN 1.5 1.2    ABG  Recent Labs Lab 10/18/15 1201  PHART 7.30*  PCO2ART 106*  PO2ART 65*    Liver Enzymes  Recent Labs Lab 10/15/15 1353  AST 89*  ALT 195*  ALKPHOS 53  BILITOT 0.5  ALBUMIN 3.4*    Cardiac Enzymes  Recent Labs Lab 10/18/15 1222 10/18/15 1752 10/19/15 0011  TROPONINI <0.03 <0.03 <0.03    Glucose  Recent Labs Lab 10/17/15 1115 10/17/15 1644 10/17/15 2116 10/18/15 0757 10/18/15 1619 10/18/15 2159  GLUCAP 276* 106* 240* 184* 164* 246*    Imaging Dg Chest 1 View  Result Date: 10/18/2015 CLINICAL DATA:  Acute on chronic respiratory failure with hypoxemia. COPD. EXAM: CHEST 1 VIEW COMPARISON:  10/15/2015 FINDINGS: The heart size and mediastinal contours are within normal limits. Pulmonary hyperinflation again demonstrated. No evidence of pulmonary infiltrate or edema. No evidence of pneumothorax or pleural effusion. Surgical clips again seen within the right axilla in skin staples in the right chest wall. IMPRESSION: Stable pulmonary hyperinflation, consistent with history of COPD. No acute findings. Electronically Signed   By: Earle Gell M.D.   On: 10/18/2015 09:22   ASSESSMENT / PLAN:  PULMONARY A: End stage emphysema with  AECOPD, acute on chronic hypoxic resp failure.  Acute bronchitis.  Nicotine abuse.  Sleep apnea P:   Continue gentle diuresis, net positive for 2L Continue pulmicort/duoneb/roflumilast/spiriva Continue Methyprednisone, taper Smoking cessation counseling  CARDIOVASCULAR  Essential Hypertension Tachycardia P:  Continuous telemetry Continue cardizem  RENAL A:   No active issues P:   Replace electrolytes per ICU protocol  GASTROINTESTINAL A:   No active issues P:   HH diet pepcid for GIP  HEMATOLOGIC A:   Throbocytopenia P:  SCDS for DVTprophylaxis  Continue Enoxaparin   INFECTIOUS A Acute Bronchitis P:   Continue levaquin  ENDOCRINE A:   Diabetese melitus P:   Receiving  steroids currently Blood glucose checks ACHS SSI coverage. NEUROLOGIC A:   Anxiety disorder P:   RASS goal: 0 Continue Remeron/cymbalta      Bincy Varughese,AG-ACNP Pulmonary and [place]   10/19/2015, 1:32 AM  Patient seen and examined with NP, agree with assessment and plan. Review blood gas testing showed a pH of 7.3/106/65/52.2-this is consistent with acute on very severe chronic hypercapnic respiratory failure, with acute on chronic hypoxic respiratory failure. This is likely due to very severe, end-stage emphysema. Patient continues to have decreased air entry bilaterally. Review chest x-ray images from today shows continued severe hyperinflation without evidence of pneumonia. Continue antibiotics, continue BiPAP daily at bedtime and when necessary, continue IV steroids.  Marda Stalker M.D. 10/19/2015   Critical Care Attestation.  I have personally obtained a history, examined the patient, evaluated laboratory and imaging results, formulated the assessment and plan and placed orders. The Patient requires high complexity decision making for assessment and support, frequent evaluation and titration of therapies, application of advanced monitoring technologies and extensive interpretation of multiple databases. The patient has critical illness that could lead imminently to failure of 1 or more organ systems and requires the highest level of physician preparedness to intervene.  Critical Care Time devoted to patient care services described in this note is 35 minutes and is exclusive of time spent in procedures.

## 2015-10-19 NOTE — Progress Notes (Signed)
Pt is in stable condition at this time. Currently refusing the Bi-pap for longer than 10 min. Pt able to answer orientation questions appropriately but, mentation remains resonably in question at this time. Occasionally pt will make comments that are inappropriate to the conversation.   Full assessment in EPIC. Report given to oncoming RN.

## 2015-10-19 NOTE — Plan of Care (Signed)
Problem: Education: Goal: Knowledge of [facility] Education information/materials will improve Outcome: Completed/Met Date Met: 10/19/15 Pt has received the ICU welcome packet and been oriented to the unit. Pt has had questions answered.  Problem: Safety: Goal: Ability to remain free from injury will improve Outcome: Progressing Pt has remained free from injury on my shift and demonstrated proper safety awareness.  Problem: Pain Managment: Goal: General experience of comfort will improve Outcome: Progressing Pt pain controlled with home pain medication scheduled and 2 doses of PRN medication.

## 2015-10-19 NOTE — Progress Notes (Signed)
Pharmacy Antibiotic Note  Craig Wright is a 50 y.o. male admitted on 10/15/2015 with pneumonia and possible bacteremia/ AECOPD, bronchitis. Pharmacy has been consulted for levofloxacin dosing. Vancomycin d/c 8/25.  This is day #5 of antibiotics.  Plan: 8/25: After discussion with Dr. Earleen Newport, will d/c vancomycin with CNS 1/2 BCx which is likely a contaminant.   Continue levofloxacin 750 mg q24h   8/26: Continue Levaquin '750mg'$  po q24h    Height: 6' (182.9 cm) Weight: 199 lb 4.7 oz (90.4 kg) IBW/kg (Calculated) : 77.6  Temp (24hrs), Avg:98.1 F (36.7 C), Min:97.6 F (36.4 C), Max:98.8 F (37.1 C)   Recent Labs Lab 10/15/15 1353 10/15/15 1926 10/18/15 0042 10/18/15 0351 10/18/15 1222 10/19/15 0011  WBC 4.6  --   --   --  5.3 4.2  CREATININE 0.62  --   --  0.42* 0.45* 0.54*  LATICACIDVEN 1.5 1.2  --   --   --   --   VANCOTROUGH  --   --  12*  --   --   --     Estimated Creatinine Clearance: 119.9 mL/min (by C-G formula based on SCr of 0.8 mg/dL).    No Known Allergies  Antimicrobials this admission: levofloxacin 8/23 >>  vancomycin 8/22 >> 8/23 then resumed 8/23 >> 8/25 Piperacillin/tazobactam 8/22 >> 8/24  Dose adjustments this admission:  Microbiology results: 8/23 BCx: CNS 1/2 staph  8/22 UCx: No growth  8/25 MRSA PCR: Negative  Thank you for allowing pharmacy to be a part of this patient's care.  Noralee Space, PharmD, BCPS Clinical Pharmacist 10/19/2015 2:20 PM

## 2015-10-19 NOTE — Progress Notes (Signed)
At 43 Pt had a run of SVT. Rate in the 180's. Pt self terminated the SVT.Dr. Juanell Fairly was notified. No new orders given. Will continue to assess.

## 2015-10-19 NOTE — Progress Notes (Signed)
Patient ID: Craig Wright, male   DOB: 1965-08-13, 50 y.o.   MRN: [id number]  [facility] PROGRESS NOTE  Craig Wright FKC:[id number] DOB: [birth year] DOA: 10/15/2015 PCP: No PCP Per Patient  HPI/Subjective: Patient's breathing is somewhat improved he is off BiPAP currently on 5 L of oxygen. Feeling little better  Objective: Vitals:   10/19/15 1200 10/19/15 1300  BP: (!) 140/94 130/88  Pulse: 100 (!) 103  Resp: (!) 24 20  Temp: 98.1 F (36.7 C)     Filed Weights   10/15/15 1348 10/18/15 1026  Weight: 87.5 kg (193 lb) 90.4 kg (199 lb 4.7 oz)    ROS: Review of Systems  Constitutional: Negative for chills and fever.  Eyes: Negative for blurred vision.  Respiratory: Positive for cough, shortness of breath and wheezing.   Cardiovascular: Negative for chest pain.  Gastrointestinal: Negative for abdominal pain, constipation, diarrhea, nausea and vomiting.  Genitourinary: Negative for dysuria.  Musculoskeletal: Negative for joint pain.  Neurological: Negative for dizziness and headaches.   Exam: Physical Exam  Constitutional: He is oriented to person, place, and time.  HENT:  Nose: No mucosal edema.  Mouth/Throat: No oropharyngeal exudate or posterior oropharyngeal edema.  Eyes: Conjunctivae, EOM and lids are normal. Pupils are equal, round, and reactive to light.  Neck: No JVD present. Carotid bruit is not present. No edema present. No thyroid mass and no thyromegaly present.  Cardiovascular: S1 normal and S2 normal.  Exam reveals no gallop.   Murmur heard.  Systolic murmur is present with a grade of 2/6  Pulses:      Dorsalis pedis pulses are 2+ on the right side, and 2+ on the left side.  Respiratory: Accessory muscle usage present. He is in respiratory distress. He has decreased breath sounds in the right upper field, the right middle field, the right lower field, the left upper field, the left middle field and the left lower field. He has no wheezes. He has no  rhonchi. He has no rales.  GI: Soft. Bowel sounds are normal. There is no tenderness.  Musculoskeletal:       Right ankle: He exhibits swelling.       Left ankle: He exhibits swelling.  Lymphadenopathy:    He has no cervical adenopathy.  Neurological: He is alert and oriented to person, place, and time. No cranial nerve deficit.  Skin: Skin is warm. No rash noted. Nails show no clubbing.  Psychiatric: He has a normal mood and affect.      Data Reviewed: Basic Metabolic Panel:  Recent Labs Lab 10/15/15 1353 10/18/15 0351 10/18/15 1222 10/19/15 0011  NA 133*  --  141 139  K 3.4*  --  3.9 4.2  CL 90*  --  94* 91*  CO2 36*  --  43* 45*  GLUCOSE 173*  --  201* 200*  BUN 6  --  9 12  CREATININE 0.62 0.42* 0.45* 0.54*  CALCIUM 8.5*  --  8.4* 8.4*  MG  --   --  1.8  --   PHOS  --   --  4.3  --    Liver Function Tests:  Recent Labs Lab 10/15/15 1353  AST 89*  ALT 195*  ALKPHOS 53  BILITOT 0.5  PROT 6.9  ALBUMIN 3.4*    Recent Labs Lab 10/15/15 1353  LIPASE 16   CBC:  Recent Labs Lab 10/15/15 1353 10/18/15 1222 10/19/15 0011  WBC 4.6 5.3 4.2  NEUTROABS 3.0  --   --  HGB 14.9 14.5 13.7  HCT 44.1 42.9 40.8  MCV 88.3 88.2 88.0  PLT 152 143* 138*   Cardiac Enzymes:  Recent Labs Lab 10/15/15 1353 10/18/15 1222 10/18/15 1752 10/19/15 0011  TROPONINI <0.03 <0.03 <0.03 <0.03   BNP (last 3 results)  Recent Labs  06/26/15 0232 10/18/15 1222  BNP 21.0 96.0    CBG:  Recent Labs Lab 10/18/15 0757 10/18/15 1619 10/18/15 2159 10/19/15 0720 10/19/15 1116  GLUCAP 184* 164* 246* 198* 214*    Recent Results (from the past 240 hour(s))  Blood Culture (routine x 2)     Status: None (Preliminary result)   Collection Time: 10/15/15  1:53 PM  Result Value Ref Range Status   Specimen Description BLOOD RIGHT WRIST  Final   Special Requests BOTTLES DRAWN AEROBIC AND ANAEROBIC [place]  Final   Culture NO GROWTH 4 DAYS  Final   Report Status  PENDING  Incomplete  Blood Culture (routine x 2)     Status: Abnormal   Collection Time: 10/15/15  1:53 PM  Result Value Ref Range Status   Specimen Description BLOOD RIGHT ASSIST CONTROL  Final   Special Requests BOTTLES DRAWN AEROBIC AND ANAEROBIC [id number],5CCANA  Final   Culture  Setup Time   Final    GRAM POSITIVE COCCI AEROBIC BOTTLE ONLY CRITICAL RESULT CALLED TO, READ BACK BY AND VERIFIED WITHVioleta Gelinas [place] AT 1535 10/16/15 MSS. CONFIRMED BY QSD    Culture (A)  Final    STAPHYLOCOCCUS SPECIES (COAGULASE NEGATIVE) THE SIGNIFICANCE OF ISOLATING THIS ORGANISM FROM A SINGLE SET OF BLOOD CULTURES WHEN MULTIPLE SETS ARE DRAWN IS UNCERTAIN. PLEASE NOTIFY THE MICROBIOLOGY DEPARTMENT WITHIN ONE WEEK IF SPECIATION AND SENSITIVITIES ARE REQUIRED. Performed at [facility]    Report Status 10/18/2015 FINAL  Final  Blood Culture ID Panel (Reflexed)     Status: Abnormal   Collection Time: 10/15/15  1:53 PM  Result Value Ref Range Status   Enterococcus species NOT DETECTED NOT DETECTED Final   Vancomycin resistance NOT DETECTED NOT DETECTED Final   Listeria monocytogenes NOT DETECTED NOT DETECTED Final   Staphylococcus species DETECTED (A) NOT DETECTED Final    Comment: CRITICAL RESULT CALLED TO, READ BACK BY AND VERIFIED WITHVioleta Gelinas [place] AT 3016 10/16/15 MSS.    Staphylococcus aureus NOT DETECTED NOT DETECTED Final   Methicillin resistance DETECTED (A) NOT DETECTED Final    Comment: CRITICAL RESULT CALLED TO, READ BACK BY AND VERIFIED WITHVioleta Gelinas [place] AT 0109 10/16/15 MSS.    Streptococcus species NOT DETECTED NOT DETECTED Final   Streptococcus agalactiae NOT DETECTED NOT DETECTED Final   Streptococcus pneumoniae NOT DETECTED NOT DETECTED Final   Streptococcus pyogenes NOT DETECTED NOT DETECTED Final   Acinetobacter baumannii NOT DETECTED NOT DETECTED Final   Enterobacteriaceae species NOT DETECTED NOT DETECTED Final   Enterobacter cloacae complex NOT DETECTED  NOT DETECTED Final   Escherichia coli NOT DETECTED NOT DETECTED Final   Klebsiella oxytoca NOT DETECTED NOT DETECTED Final   Klebsiella pneumoniae NOT DETECTED NOT DETECTED Final   Proteus species NOT DETECTED NOT DETECTED Final   Serratia marcescens NOT DETECTED NOT DETECTED Final   Carbapenem resistance NOT DETECTED NOT DETECTED Final   Haemophilus influenzae NOT DETECTED NOT DETECTED Final   Neisseria meningitidis NOT DETECTED NOT DETECTED Final   Pseudomonas aeruginosa NOT DETECTED NOT DETECTED Final   Candida albicans NOT DETECTED NOT DETECTED Final   Candida glabrata NOT DETECTED NOT DETECTED Final   Candida krusei  NOT DETECTED NOT DETECTED Final   Candida parapsilosis NOT DETECTED NOT DETECTED Final   Candida tropicalis NOT DETECTED NOT DETECTED Final  Urine culture     Status: None   Collection Time: 10/15/15  3:35 PM  Result Value Ref Range Status   Specimen Description URINE, RANDOM  Final   Special Requests NONE  Final   Culture NO GROWTH Performed at [facility]   Final   Report Status 10/16/2015 FINAL  Final  MRSA PCR Screening     Status: None   Collection Time: 10/15/15  6:32 PM  Result Value Ref Range Status   MRSA by PCR NEGATIVE NEGATIVE Final    Comment:        The GeneXpert MRSA Assay (FDA approved for NASAL specimens only), is one component of a comprehensive MRSA colonization surveillance program. It is not intended to diagnose MRSA infection nor to guide or monitor treatment for MRSA infections.       Scheduled Meds: . budesonide (PULMICORT) nebulizer solution  0.5 mg Nebulization BID  . clonazePAM  1 mg Oral BID  . diltiazem  60 mg Oral Q8H  . DULoxetine  60 mg Oral BID  . enoxaparin (LOVENOX) injection  40 mg Subcutaneous Q24H  . famotidine (PEPCID) IV  20 mg Intravenous Q12H  . guaiFENesin  600 mg Oral BID  . insulin aspart  0-5 Units Subcutaneous QHS  . insulin aspart  0-9 Units Subcutaneous TID WC  . insulin glargine  12 Units  Subcutaneous QHS  . ipratropium-albuterol  3 mL Nebulization Q4H  . levofloxacin  750 mg Oral Daily  . methylPREDNISolone (SOLU-MEDROL) injection  60 mg Intravenous Q6H  . mirtazapine  45 mg Oral QHS  . oxyCODONE  20 mg Oral Q12H  . risperiDONE  2 mg Oral QHS  . roflumilast  500 mcg Oral Daily  . senna-docusate  1 tablet Oral BID  . sodium chloride flush  3 mL Intravenous Q12H  . tiotropium  18 mcg Inhalation Daily    Assessment/Plan:  1. Acute on chronic hypoxic respiratory failure. With respiratory distress,Continue current therapy with nebulizers steroids and antibiotics 2. Acute on chronic COPD exacerbation. Solu-Medrol 60 mg daily 6 hours. Added budesonide nebulizers. Continue DuoNeb nebulizers. Continue Levaquin. Chest x-ray negative 3. Systemic inflammatory response syndrome. Likely vital signs abnormalities because of acute respiratory failure and COPD exacerbation. Unfortunately positive blood culture with staph species. Usually that's a contamination. Continue vancomycin for now. 4. Tachycardia- cardizem cd 5. Chronic pain- contniue medications 6. Type 2 diabetes- sliding scale.  Continue lantus 7. Anxiety/depression- continue medications  Code Status: Patient reversed code status to full code    Code Status Orders        Start     Ordered   10/15/15 1644  Do not attempt resuscitation (DNR)  Continuous    Question Answer Comment  In the event of cardiac or respiratory ARREST Do not call a "code blue"   In the event of cardiac or respiratory ARREST Do not perform Intubation, CPR, defibrillation or ACLS   In the event of cardiac or respiratory ARREST Use medication by any route, position, wound care, and other measures to relive pain and suffering. May use oxygen, suction and manual treatment of airway obstruction as needed for comfort.      10/15/15 1643    Code Status History    Date Active Date Inactive Code Status Order ID Comments User Context   10/15/2015  4:43  PM 10/15/2015  4:43 PM Full Code [id number]  Lytle Butte, MD ED   06/26/2015 10:13 AM 06/28/2015  6:05 PM DNR [id number]  Bettey Costa, MD Inpatient   06/26/2015  6:05 AM 06/26/2015 10:13 AM Full Code [id number]  Saundra Shelling, MD Inpatient   06/08/2015 12:15 AM 06/10/2015  4:32 PM Full Code [id number]  Saundra Shelling, MD Inpatient    Advance Directive Documentation   [place] Most Recent Value  Type of Advance Directive  Living will  Pre-existing out of facility DNR order (yellow form or pink MOST form)  No data  "MOST" Form in Place?  No data     Disposition Plan: TBD  Antibiotics:  Levaquin  Vancomycin  Time spent: 88mnutes, critical care time.  Case discused with Patient, nursing, patients mother and critical care specialist  [facility] [place] [facility]

## 2015-10-20 ENCOUNTER — Inpatient Hospital Stay: Payer: Medicaid Other

## 2015-10-20 LAB — CULTURE, BLOOD (ROUTINE X 2): CULTURE: NO GROWTH

## 2015-10-20 LAB — MAGNESIUM: Magnesium: 2.2 mg/dL (ref 1.7–2.4)

## 2015-10-20 LAB — CBC
HEMATOCRIT: 38.7 % — AB (ref 40.0–52.0)
HEMOGLOBIN: 13.1 g/dL (ref 13.0–18.0)
MCH: 29.6 pg (ref 26.0–34.0)
MCHC: 33.9 g/dL (ref 32.0–36.0)
MCV: 87.4 fL (ref 80.0–100.0)
Platelets: 121 10*3/uL — ABNORMAL LOW (ref 150–440)
RBC: 4.43 MIL/uL (ref 4.40–5.90)
RDW: 13.9 % (ref 11.5–14.5)
WBC: 3.1 10*3/uL — ABNORMAL LOW (ref 3.8–10.6)

## 2015-10-20 LAB — BASIC METABOLIC PANEL
Anion gap: 2 — ABNORMAL LOW (ref 5–15)
BUN: 18 mg/dL (ref 6–20)
CALCIUM: 8.5 mg/dL — AB (ref 8.9–10.3)
CHLORIDE: 90 mmol/L — AB (ref 101–111)
CO2: 46 mmol/L — AB (ref 22–32)
Creatinine, Ser: 0.45 mg/dL — ABNORMAL LOW (ref 0.61–1.24)
GFR calc Af Amer: >60 mL/min (ref >60)
Glucose, Bld: 226 mg/dL — ABNORMAL HIGH (ref 65–99)
POTASSIUM: 4.4 mmol/L (ref 3.5–5.1)
SODIUM: 138 mmol/L (ref 135–145)

## 2015-10-20 LAB — BLOOD GAS, ARTERIAL
ACID-BASE EXCESS: 21 mmol/L — AB (ref 0.0–3.0)
BICARBONATE: 52.7 meq/L — AB (ref 21.0–28.0)
FIO2: 0.5
Mode: POSITIVE
O2 Saturation: 96.5 %
PH ART: 7.33 — AB (ref 7.350–7.450)
PO2 ART: 91 mmHg (ref 83.0–108.0)
Patient temperature: 37
pCO2 arterial: 100 mmHg (ref 32.0–48.0)

## 2015-10-20 LAB — GLUCOSE, CAPILLARY
Glucose-Capillary: 283 mg/dL — ABNORMAL HIGH (ref 65–99)
Glucose-Capillary: 142 mg/dL — ABNORMAL HIGH (ref 65–99)

## 2015-10-20 LAB — PHOSPHORUS: PHOSPHORUS: 3.3 mg/dL (ref 2.5–4.6)

## 2015-10-20 MED ORDER — PREDNISONE 20 MG PO TABS
40.0000 mg | ORAL_TABLET | Freq: Two times a day (BID) | ORAL | Status: DC
  Administered 2015-10-20: 40 mg via ORAL
  Filled 2015-10-20: qty 2

## 2015-10-20 MED ORDER — FAMOTIDINE 20 MG PO TABS
20.0000 mg | ORAL_TABLET | Freq: Two times a day (BID) | ORAL | Status: DC
  Administered 2015-10-20: 20 mg via ORAL
  Filled 2015-10-20 (×2): qty 1

## 2015-10-20 MED ORDER — INSULIN ASPART 100 UNIT/ML ~~LOC~~ SOLN
0.0000 [IU] | Freq: Three times a day (TID) | SUBCUTANEOUS | Status: DC
  Administered 2015-10-20: 2 [IU] via SUBCUTANEOUS
  Administered 2015-10-21: 3 [IU] via SUBCUTANEOUS
  Administered 2015-10-21: 5 [IU] via SUBCUTANEOUS
  Administered 2015-10-21: 11 [IU] via SUBCUTANEOUS
  Administered 2015-10-22: 2 [IU] via SUBCUTANEOUS
  Filled 2015-10-20: qty 5
  Filled 2015-10-20: qty 11
  Filled 2015-10-20 (×2): qty 2
  Filled 2015-10-20: qty 3

## 2015-10-20 MED ORDER — RISPERIDONE 1 MG PO TABS
1.0000 mg | ORAL_TABLET | Freq: Three times a day (TID) | ORAL | Status: DC | PRN
  Administered 2015-10-20 – 2015-10-21 (×2): 1 mg via ORAL
  Filled 2015-10-20 (×2): qty 2
  Filled 2015-10-20: qty 1

## 2015-10-20 MED ORDER — CLONAZEPAM 1 MG PO TABS
2.0000 mg | ORAL_TABLET | Freq: Two times a day (BID) | ORAL | Status: DC
  Administered 2015-10-20: 2 mg via ORAL
  Filled 2015-10-20 (×2): qty 2

## 2015-10-20 MED ORDER — METHYLPREDNISOLONE SODIUM SUCC 40 MG IJ SOLR
40.0000 mg | Freq: Two times a day (BID) | INTRAMUSCULAR | Status: DC
  Administered 2015-10-20 (×2): 40 mg via INTRAVENOUS
  Filled 2015-10-20 (×2): qty 1

## 2015-10-20 NOTE — Progress Notes (Signed)
[place] Progress Note Patient Name: Craig Wright DOB: 04-Feb-1966 MRN: [id number]   Date of Service  10/20/2015  HPI/Events of Note  Pt with agitation  eICU Interventions  Will add prn risperdal and increase klonopin to '2mg'$  bid     Intervention Category Major Interventions: Delirium, psychosis, severe agitation - evaluation and management  Asencion Noble 10/20/2015, 5:26 PM

## 2015-10-20 NOTE — Progress Notes (Addendum)
Advance Care Planning Note:    8/22: DNR on admission. 8/25: Made Full code> transferred to ICU for resp distress.  8/25: Made DNR. Managed on bipap.  8/26: Back to full code.  8/27: Discussed with patient and mother, mutually agreed on DNR status.   Currently I explained the nature of the patient's terminal/ end stage COPD in the presence of the patient' s mother. The patient appears much more lucid this morning, he has been wearing his bipap all night. He now appears to understand more about his end stage COPD diagnosis. Also explained that we will try to get him back to his assisted living as he requested, but this may not be possible given his dyspnea and weakness. He expressed that his QOL is poor at this time, he is no longer able to do things that he enjoys, and he is no longer able to participate in things that he enjoys, other than being at his assisted living with some friends that he made there. He understands that if he is put on life support there is a strong likelihood that he would not be able to come off. He expressed that he does not want to be kept alive by machines, given everything that we talked about, even for a short time. He would rather be made comfortable. He and his mother are in agreement about this, and his mother will relay this to health care providers in the future if he is unable. Will change code status back to DNR. Will consult palliative to help in further discussions and discharge planning.    -Marda Stalker, M.D. 10/20/2015 30 min spent in discussion.

## 2015-10-20 NOTE — Progress Notes (Signed)
Pharmacy Antibiotic Note  Craig Wright is a 50 y.o. male admitted on 10/15/2015 with pneumonia and possible bacteremia/ AECOPD, bronchitis. Pharmacy has been consulted for levofloxacin dosing. Vancomycin d/c 8/25.  This is day #5 of antibiotics.  Plan: 8/25: After discussion with Dr. Earleen Newport, will d/c vancomycin with CNS 1/2 BCx which is likely a contaminant.   Continue levofloxacin 750 mg q24h   8/26: Continue Levaquin '750mg'$  po q24h    Height: 6' (182.9 cm) Weight: 199 lb 4.7 oz (90.4 kg) IBW/kg (Calculated) : 77.6  Temp (24hrs), Avg:98.2 F (36.8 C), Min:97.6 F (36.4 C), Max:98.8 F (37.1 C)   Recent Labs Lab 10/15/15 1353 10/15/15 1926 10/18/15 0042 10/18/15 0351 10/18/15 1222 10/19/15 0011 10/19/15 1444 10/20/15 0446  WBC 4.6  --   --   --  5.3 4.2  --  3.1*  CREATININE 0.62  --   --  0.42* 0.45* 0.54* 0.56* 0.45*  LATICACIDVEN 1.5 1.2  --   --   --   --   --   --   VANCOTROUGH  --   --  12*  --   --   --   --   --     Estimated Creatinine Clearance: 119.9 mL/min (by C-G formula based on SCr of 0.8 mg/dL).    No Known Allergies  Antimicrobials this admission: levofloxacin 8/23 >>  vancomycin 8/22 >> 8/23 then resumed 8/23 >> 8/25 Piperacillin/tazobactam 8/22 >> 8/24  Dose adjustments this admission:  Microbiology results: 8/23 BCx: CNS 1/2 staph  8/22 UCx: No growth  8/25 MRSA PCR: Negative  Thank you for allowing pharmacy to be a part of this patient's care.  Noralee Space, PharmD, BCPS Clinical Pharmacist 10/20/2015 2:16 PM

## 2015-10-20 NOTE — Progress Notes (Signed)
PHARMACIST - PHYSICIAN COMMUNICATION  CONCERNING: IV to Oral Route Change Policy  RECOMMENDATION: This patient is receiving FAMOTIDINE by the intravenous route.  Based on criteria approved by the Pharmacy and Therapeutics Committee, the intravenous medication(s) is/are being converted to the equivalent oral dose form(s).   DESCRIPTION: These criteria include:  The patient is eating (either orally or via tube) and/or has been taking other orally administered medications for a least 24 hours  The patient has no evidence of active gastrointestinal bleeding or impaired GI absorption (gastrectomy, short bowel, patient on TNA or NPO).  If you have questions about this conversion, please contact the Pharmacy Department  '[]'$   [phone] )  [facility] '[x]'$   [phone] )  [facility] '[]'$   [phone] )  [facility] '[]'$   [phone] )  [facility] '[]'$   [phone] )  [place], [facility] 10/20/2015 2:15 PM

## 2015-10-20 NOTE — Progress Notes (Signed)
PULMONARY / CRITICAL CARE MEDICINE   Name: Craig Wright MRN: [id number] DOB: June 22, 1965    ADMISSION DATE:  10/15/2015    Patient profile: 50 yo male smoker with end stage emphysema, admitted with AECOPD. Now transferred to ICU with pulm edema and resp distress, placed on bipap  SUBJECTIVE:  Patient had an uneventful night. Was compliant with BiPAP night.  VITAL SIGNS: BP 113/70   Pulse 66   Temp 98.4 F (36.9 C) (Axillary)   Resp 15   Ht 6' (1.829 m)   Wt 199 lb 4.7 oz (90.4 kg)   SpO2 97%   BMI 27.03 kg/m   HEMODYNAMICS:    VENTILATOR SETTINGS: FiO2 (%):  [50 %] 50 %  INTAKE / OUTPUT: I/O last 3 completed shifts: In: 763 [P.O.:360; I.V.:3; IV Piggyback:400] Out: 3900 [Urine:3900]  PHYSICAL EXAMINATION: General:  Middle aged man , found on BiPAP in no acute distress Neuro:  Awake, alert, oriented, no focal deficits HEENT:  Atraumatic, normocephalic, no JVD appreciated Cardiovascular:  S1S2, regular, no MRG noted Lungs: decreased breath sounds bilaterally, no wheezes, crackles, rhonchi noted. Abdomen:  Soft, non tentender, good bowel sounds Musculoskeletal: no inflammation/deformity noted Skin:  Grossly intact  LABS:  BMET  Recent Labs Lab 10/18/15 1222 10/19/15 0011 10/19/15 1444  NA 141 139 137  K 3.9 4.2 4.6  CL 94* 91* 89*  CO2 43* 45* 44*  BUN '9 12 15  '$ CREATININE 0.45* 0.54* 0.56*  GLUCOSE 201* 200* 305*    Electrolytes  Recent Labs Lab 10/18/15 1222 10/19/15 0011 10/19/15 1444  CALCIUM 8.4* 8.4* 8.5*  MG 1.8  --   --   PHOS 4.3  --   --     CBC  Recent Labs Lab 10/15/15 1353 10/18/15 1222 10/19/15 0011  WBC 4.6 5.3 4.2  HGB 14.9 14.5 13.7  HCT 44.1 42.9 40.8  PLT 152 143* 138*    Coag's  Recent Labs Lab 10/15/15 1353 10/18/15 1222  APTT 33  --   INR 1.01 0.98    Sepsis Markers  Recent Labs Lab 10/15/15 1353 10/15/15 1926  LATICACIDVEN 1.5 1.2    ABG  Recent Labs Lab 10/18/15 1201  PHART 7.30*   PCO2ART 106*  PO2ART 65*    Liver Enzymes  Recent Labs Lab 10/15/15 1353  AST 89*  ALT 195*  ALKPHOS 53  BILITOT 0.5  ALBUMIN 3.4*    Cardiac Enzymes  Recent Labs Lab 10/18/15 1222 10/18/15 1752 10/19/15 0011  TROPONINI <0.03 <0.03 <0.03    Glucose  Recent Labs Lab 10/18/15 1619 10/18/15 2159 10/19/15 0720 10/19/15 1116 10/19/15 1640 10/19/15 2151  GLUCAP 164* 246* 198* 214* 196* 198*    Imaging Dg Chest 1 View  Result Date: 10/19/2015 CLINICAL DATA:  Acute on chronic respiratory failure with hypoxemia EXAM: CHEST 1 VIEW COMPARISON:  10/18/2015 FINDINGS: COPD with pulmonary hyperinflation. Negative for infiltrate or effusion. Negative for heart failure. No change from the prior study. IMPRESSION: COPD.  No acute consolidation and no interval change. Electronically Signed   By: Franchot Gallo M.D.   On: 10/19/2015 07:17   ASSESSMENT / PLAN:  PULMONARY A: End stage emphysema with AECOPD, acute on chronic hypoxic resp failure.  Acute bronchitis.  Nicotine abuse.  Sleep apnea P:    Continue pulmicort/duoneb/roflumilast/spiriva Prednisone taper Smoking cessation counseling Continue levofloxacin CARDIOVASCULAR  Essential Hypertension Tachycardia P:  Continuous telemetry Continue cardizem  RENAL A:   No active issues P:   Replace electrolytes per ICU protocol  GASTROINTESTINAL A:   No active issues P:   HH diet pepcid for GIP  HEMATOLOGIC A:   Throbocytopenia P:  SCDS for DVTprophylaxis  Continue Enoxaparin   INFECTIOUS A Acute Bronchitis P:   Continue Levaquin for 7 days,   ENDOCRINE A:   Diabetese melitus P:   Receiving steroids currently Blood glucose checks ACHS SSI coverage. NEUROLOGIC A:   Anxiety disorder P:   RASS goal: 0 Continue Remeron/cymbalta      Bincy Varughese,AG-ACNP Pulmonary and Critical Care Medicine [facility]   10/20/2015, 1:22 AM  Pt seen and examined with NP, agree with  assessment and plan. Acute bronchitis with acute resp failure with exacerbation of end stage COPD. Pt appears better today, less wheezing, still dyspneic. Will monitor in ICU, continue bipap qhs and prn. Continue steroids.   -Deep Daffney Greenly. 10/20/2015  Critical Care Attestation.  I have personally obtained a history, examined the patient, evaluated laboratory and imaging results, formulated the assessment and plan and placed orders. The Patient requires high complexity decision making for assessment and support, frequent evaluation and titration of therapies, application of advanced monitoring technologies and extensive interpretation of multiple databases. The patient has critical illness that could lead imminently to failure of 1 or more organ systems and requires the highest level of physician preparedness to intervene.  Critical Care Time devoted to patient care services described in this note is 35 minutes and is exclusive of time spent in procedures.

## 2015-10-20 NOTE — Progress Notes (Signed)
Patient's cbg checks remain elevated even with coverage. Per Dr. Ashby Dawes change patient to moderate sliding scale insulin, order placed. Wilnette Kales

## 2015-10-20 NOTE — Progress Notes (Signed)
Pt. Entered into an Accelerated Junctional Rhythm in the 70's.  P wave inverted and less than .12 from Q. Ruled out position and malfunction with leads. Discussed with Ms. Varughese, NP suggested EKG.   NP stated to monitor pt. For changes  Pt.'s VSS, will continue to monitor pt. Closely.

## 2015-10-21 DIAGNOSIS — J449 Chronic obstructive pulmonary disease, unspecified: Secondary | ICD-10-CM

## 2015-10-21 DIAGNOSIS — R531 Weakness: Secondary | ICD-10-CM

## 2015-10-21 DIAGNOSIS — Z789 Other specified health status: Secondary | ICD-10-CM

## 2015-10-21 DIAGNOSIS — J9611 Chronic respiratory failure with hypoxia: Secondary | ICD-10-CM

## 2015-10-21 DIAGNOSIS — Z66 Do not resuscitate: Secondary | ICD-10-CM

## 2015-10-21 DIAGNOSIS — J9622 Acute and chronic respiratory failure with hypercapnia: Secondary | ICD-10-CM

## 2015-10-21 DIAGNOSIS — R0600 Dyspnea, unspecified: Secondary | ICD-10-CM

## 2015-10-21 LAB — GLUCOSE, CAPILLARY
GLUCOSE-CAPILLARY: 220 mg/dL — AB (ref 65–99)
GLUCOSE-CAPILLARY: 216 mg/dL — AB (ref 65–99)
GLUCOSE-CAPILLARY: 148 mg/dL — AB (ref 65–99)
Glucose-Capillary: 205 mg/dL — ABNORMAL HIGH (ref 65–99)
Glucose-Capillary: 197 mg/dL — ABNORMAL HIGH (ref 65–99)
Glucose-Capillary: 205 mg/dL — ABNORMAL HIGH (ref 65–99)
Glucose-Capillary: 329 mg/dL — ABNORMAL HIGH (ref 65–99)
Glucose-Capillary: 196 mg/dL — ABNORMAL HIGH (ref 65–99)

## 2015-10-21 LAB — CBC
HEMATOCRIT: 39.1 % — AB (ref 40.0–52.0)
HEMOGLOBIN: 13.4 g/dL (ref 13.0–18.0)
MCH: 29.7 pg (ref 26.0–34.0)
MCHC: 34.2 g/dL (ref 32.0–36.0)
MCV: 86.8 fL (ref 80.0–100.0)
PLATELETS: 124 10*3/uL — AB (ref 150–440)
RBC: 4.51 MIL/uL (ref 4.40–5.90)
RDW: 13.5 % (ref 11.5–14.5)
WBC: 4.2 10*3/uL (ref 3.8–10.6)

## 2015-10-21 LAB — BASIC METABOLIC PANEL
Anion gap: 3 — ABNORMAL LOW (ref 5–15)
BUN: 18 mg/dL (ref 6–20)
CALCIUM: 8.6 mg/dL — AB (ref 8.9–10.3)
CO2: 45 mmol/L — ABNORMAL HIGH (ref 22–32)
CREATININE: 0.4 mg/dL — AB (ref 0.61–1.24)
Chloride: 91 mmol/L — ABNORMAL LOW (ref 101–111)
GFR calc Af Amer: >60 mL/min (ref >60)
GLUCOSE: 207 mg/dL — AB (ref 65–99)
Potassium: 4.5 mmol/L (ref 3.5–5.1)
SODIUM: 139 mmol/L (ref 135–145)

## 2015-10-21 MED ORDER — PREDNISONE 20 MG PO TABS
40.0000 mg | ORAL_TABLET | Freq: Every day | ORAL | Status: DC
  Administered 2015-10-21 – 2015-10-23 (×3): 40 mg via ORAL
  Filled 2015-10-21 (×3): qty 2

## 2015-10-21 MED ORDER — METOPROLOL TARTRATE 5 MG/5ML IV SOLN: 2.5000 mg | INTRAVENOUS | Status: DC | PRN

## 2015-10-21 MED ORDER — CLONAZEPAM 1 MG PO TABS
1.0000 mg | ORAL_TABLET | Freq: Two times a day (BID) | ORAL | Status: DC
  Administered 2015-10-21 – 2015-10-23 (×5): 1 mg via ORAL
  Filled 2015-10-21 (×4): qty 1

## 2015-10-21 MED ORDER — NICOTINE 21 MG/24HR TD PT24
21.0000 mg | MEDICATED_PATCH | Freq: Every day | TRANSDERMAL | Status: DC
  Administered 2015-10-21 – 2015-10-23 (×3): 21 mg via TRANSDERMAL
  Filled 2015-10-21 (×3): qty 1

## 2015-10-21 MED ORDER — IPRATROPIUM-ALBUTEROL 0.5-2.5 (3) MG/3ML IN SOLN
3.0000 mL | Freq: Four times a day (QID) | RESPIRATORY_TRACT | Status: DC
  Administered 2015-10-21 – 2015-10-23 (×7): 3 mL via RESPIRATORY_TRACT
  Filled 2015-10-21 (×8): qty 3
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
## 2015-10-21 NOTE — Progress Notes (Signed)
Inpatient Diabetes Program Recommendations  AACE/ADA: New Consensus Statement on Inpatient Glycemic Control (2015)  Target Ranges:  Prepandial:   less than 140 mg/dL      Peak postprandial:   less than 180 mg/dL (1-2 hours)      Critically ill patients:  140 - 180 mg/dL   Lab Results  Component Value Date   GLUCAP 142 (H) 10/20/2015   HGBA1C 8.0 (H) 10/15/2015    Review of Glycemic Control  Results for Craig Wright, Craig Wright (MRN [id number]) as of 10/21/2015 09:45  Ref. Range 10/19/2015 11:16 10/19/2015 16:40 10/19/2015 21:51 10/20/2015 11:53 10/20/2015 17:16  Glucose-Capillary Latest Ref Range: 65 - 99 mg/dL 214 (H) 196 (H) 198 (H) 283 (H) 142 (H)    Diabetes history: Type 2 Outpatient Diabetes medications: Metformin '500mg'$  qam Current orders for Inpatient glycemic control: Lantus 12 units qday, Novolog 0-15 units tid, Novolog 0-5 units qhs  Inpatient Diabetes Program Recommendations:  Fasting blood sugar elevated- please consider increasing Lantus to 18 units qhs (0.2units/kg), and add Novolog 3 units tid with meals - continue Novolog correction as ordered.   Please change diet to carb modified/ heart healthy  Gentry Fitz, RN, [place], [place], CDE Diabetes Coordinator Inpatient Diabetes Program  [phone] (Team Pager) [phone] ([place]) 10/21/2015 9:49 AM

## 2015-10-22 DIAGNOSIS — R531 Weakness: Secondary | ICD-10-CM

## 2015-10-22 DIAGNOSIS — Z515 Encounter for palliative care: Secondary | ICD-10-CM

## 2015-10-22 DIAGNOSIS — Z66 Do not resuscitate: Secondary | ICD-10-CM

## 2015-10-22 DIAGNOSIS — J9611 Chronic respiratory failure with hypoxia: Secondary | ICD-10-CM

## 2015-10-22 LAB — GLUCOSE, CAPILLARY
GLUCOSE-CAPILLARY: 84 mg/dL (ref 65–99)
GLUCOSE-CAPILLARY: 150 mg/dL — AB (ref 65–99)
GLUCOSE-CAPILLARY: 263 mg/dL — AB (ref 65–99)
Glucose-Capillary: 232 mg/dL — ABNORMAL HIGH (ref 65–99)

## 2015-10-22 LAB — CREATININE, SERUM: Creatinine, Ser: 0.41 mg/dL — ABNORMAL LOW (ref 0.61–1.24)

## 2015-10-22 MED ORDER — INSULIN ASPART 100 UNIT/ML ~~LOC~~ SOLN
0.0000 [IU] | Freq: Three times a day (TID) | SUBCUTANEOUS | Status: DC
  Administered 2015-10-22: 5 [IU] via SUBCUTANEOUS
  Administered 2015-10-23: 9 [IU] via SUBCUTANEOUS
  Filled 2015-10-22: qty 5
  Filled 2015-10-22: qty 9

## 2015-10-22 MED ORDER — INSULIN GLARGINE 100 UNIT/ML ~~LOC~~ SOLN
8.0000 [IU] | Freq: Every day | SUBCUTANEOUS | Status: DC
  Administered 2015-10-22: 8 [IU] via SUBCUTANEOUS
  Filled 2015-10-22 (×2): qty 0.08

## 2015-10-22 MED ORDER — DILTIAZEM HCL 60 MG PO TABS
60.0000 mg | ORAL_TABLET | Freq: Three times a day (TID) | ORAL | Status: DC
  Administered 2015-10-22 – 2015-10-23 (×3): 60 mg via ORAL
  Filled 2015-10-22 (×3): qty 1

## 2015-10-22 NOTE — Progress Notes (Signed)
Patient has needed break through pain med twice today.  Continues on the BID Oxycontin.

## 2015-10-22 NOTE — Care Management Note (Signed)
Case Management Note  Patient Details  Name: Craig Wright MRN: [id number] Date of Birth: 1965-05-19  Subjective/Objective:  Spoke with patients mom, [name] [phone]). Discussed home health services. She is agreeable but does not want anything to do with hospice or palliative care services. No agency preference. Referral to [facility] for [facility] and PT.                  Action/Plan: [facility] for SN and PT at [facility].   Expected Discharge Date:  10/23/2015               Expected Discharge Plan:  [place]  In-House Referral:     Discharge planning Services  CM Consult  Post Acute Care Choice:  Home Health Choice offered to:  Parent  DME Arranged:    DME Agency:     HH Arranged:  RN, PT [place] Agency:  [place]  Status of Service:  In process, will continue to follow  If discussed at Long Length of Stay Meetings, dates discussed:    Additional Comments:  Jolly Mango, RN 10/22/2015, 1:10 PM

## 2015-10-22 NOTE — Progress Notes (Signed)
Patient ID: Craig Wright, male   DOB: Jan 27, 1966, 50 y.o.   MRN: [id number]  [facility] PROGRESS NOTE  Craig Wright DOB: 1965/05/05 DOA: 10/15/2015 PCP: No PCP Per Patient  HPI/Subjective: Patient's breathing is improved transfer from the ICU back to our service  Objective: Vitals:   10/22/15 1210 10/22/15 1222  BP:  (!) 150/97  Pulse: (!) 112 (!) 111  Resp:  18  Temp:  98 F (36.7 C)    Filed Weights   10/15/15 1348 10/18/15 1026  Weight: 87.5 kg (193 lb) 90.4 kg (199 lb 4.7 oz)    ROS: Review of Systems  Constitutional: Negative for chills and fever.  Eyes: Negative for blurred vision.  Respiratory: Positive for cough and shortness of breath. Negative for wheezing.   Cardiovascular: Negative for chest pain.  Gastrointestinal: Negative for abdominal pain, constipation, diarrhea, nausea and vomiting.  Genitourinary: Negative for dysuria.  Musculoskeletal: Negative for joint pain.  Neurological: Negative for dizziness and headaches.   Exam: Physical Exam  Constitutional: He is oriented to person, place, and time.  HENT:  Nose: No mucosal edema.  Mouth/Throat: No oropharyngeal exudate or posterior oropharyngeal edema.  Eyes: Conjunctivae, EOM and lids are normal. Pupils are equal, round, and reactive to light.  Neck: No JVD present. Carotid bruit is not present. No edema present. No thyroid mass and no thyromegaly present.  Cardiovascular: S1 normal and S2 normal.  Exam reveals no gallop.   Murmur heard.  Systolic murmur is present with a grade of 2/6  Pulses:      Dorsalis pedis pulses are 2+ on the right side, and 2+ on the left side.  Respiratory: No respiratory distress. He has decreased breath sounds in the right upper field, the right middle field, the right lower field, the left upper field, the left middle field and the left lower field. He has no wheezes. He has no rhonchi. He has no rales.  GI: Soft. Bowel sounds are normal. There  is no tenderness.  Musculoskeletal:       Right ankle: He exhibits swelling.       Left ankle: He exhibits swelling.  Lymphadenopathy:    He has no cervical adenopathy.  Neurological: He is alert and oriented to person, place, and time. No cranial nerve deficit.  Skin: Skin is warm. No rash noted. Nails show no clubbing.  Psychiatric: He has a normal mood and affect.      Data Reviewed: Basic Metabolic Panel:  Recent Labs Lab 10/18/15 1222 10/19/15 0011 10/19/15 1444 10/20/15 0446 10/21/15 0424 10/22/15 0526  NA 141 139 137 138 139  --   K 3.9 4.2 4.6 4.4 4.5  --   CL 94* 91* 89* 90* 91*  --   CO2 43* 45* 44* 46* 45*  --   GLUCOSE 201* 200* 305* 226* 207*  --   BUN '9 12 15 18 18  '$ --   CREATININE 0.45* 0.54* 0.56* 0.45* 0.40* 0.41*  CALCIUM 8.4* 8.4* 8.5* 8.5* 8.6*  --   MG 1.8  --   --  2.2  --   --   PHOS 4.3  --   --  3.3  --   --    Liver Function Tests:  Recent Labs Lab 10/15/15 1353  AST 89*  ALT 195*  ALKPHOS 53  BILITOT 0.5  PROT 6.9  ALBUMIN 3.4*    Recent Labs Lab 10/15/15 1353  LIPASE 16   CBC:  Recent Labs Lab  10/15/15 1353 10/18/15 1222 10/19/15 0011 10/20/15 0446 10/21/15 0424  WBC 4.6 5.3 4.2 3.1* 4.2  NEUTROABS 3.0  --   --   --   --   HGB 14.9 14.5 13.7 13.1 13.4  HCT 44.1 42.9 40.8 38.7* 39.1*  MCV 88.3 88.2 88.0 87.4 86.8  PLT 152 143* 138* 121* 124*   Cardiac Enzymes:  Recent Labs Lab 10/15/15 1353 10/18/15 1222 10/18/15 1752 10/19/15 0011  TROPONINI <0.03 <0.03 <0.03 <0.03   BNP (last 3 results)  Recent Labs  06/26/15 0232 10/18/15 1222  BNP 21.0 96.0    CBG:  Recent Labs Lab 10/21/15 1110 10/21/15 1656 10/21/15 2120 10/22/15 0736 10/22/15 1226  GLUCAP 329* 196* 148* 84 150*    Recent Results (from the past 240 hour(s))  Blood Culture (routine x 2)     Status: None   Collection Time: 10/15/15  1:53 PM  Result Value Ref Range Status   Specimen Description BLOOD RIGHT WRIST  Final   Special  Requests BOTTLES DRAWN AEROBIC AND ANAEROBIC [place]  Final   Culture NO GROWTH 5 DAYS  Final   Report Status 10/20/2015 FINAL  Final  Blood Culture (routine x 2)     Status: Abnormal   Collection Time: 10/15/15  1:53 PM  Result Value Ref Range Status   Specimen Description BLOOD RIGHT ASSIST CONTROL  Final   Special Requests BOTTLES DRAWN AEROBIC AND ANAEROBIC 2CCAERO,5CCANA  Final   Culture  Setup Time   Final    GRAM POSITIVE COCCI AEROBIC BOTTLE ONLY CRITICAL RESULT CALLED TO, READ BACK BY AND VERIFIED WITHVioleta Gelinas [place] AT 1535 10/16/15 MSS. CONFIRMED BY QSD    Culture (A)  Final    STAPHYLOCOCCUS SPECIES (COAGULASE NEGATIVE) THE SIGNIFICANCE OF ISOLATING THIS ORGANISM FROM A SINGLE SET OF BLOOD CULTURES WHEN MULTIPLE SETS ARE DRAWN IS UNCERTAIN. PLEASE NOTIFY THE MICROBIOLOGY DEPARTMENT WITHIN ONE WEEK IF SPECIATION AND SENSITIVITIES ARE REQUIRED. Performed at [facility]    Report Status 10/18/2015 FINAL  Final  Blood Culture ID Panel (Reflexed)     Status: Abnormal   Collection Time: 10/15/15  1:53 PM  Result Value Ref Range Status   Enterococcus species NOT DETECTED NOT DETECTED Final   Vancomycin resistance NOT DETECTED NOT DETECTED Final   Listeria monocytogenes NOT DETECTED NOT DETECTED Final   Staphylococcus species DETECTED (A) NOT DETECTED Final    Comment: CRITICAL RESULT CALLED TO, READ BACK BY AND VERIFIED WITHVioleta Gelinas [place] AT 4098 10/16/15 MSS.    Staphylococcus aureus NOT DETECTED NOT DETECTED Final   Methicillin resistance DETECTED (A) NOT DETECTED Final    Comment: CRITICAL RESULT CALLED TO, READ BACK BY AND VERIFIED WITHVioleta Gelinas [place] AT 1191 10/16/15 MSS.    Streptococcus species NOT DETECTED NOT DETECTED Final   Streptococcus agalactiae NOT DETECTED NOT DETECTED Final   Streptococcus pneumoniae NOT DETECTED NOT DETECTED Final   Streptococcus pyogenes NOT DETECTED NOT DETECTED Final   Acinetobacter baumannii NOT DETECTED NOT  DETECTED Final   Enterobacteriaceae species NOT DETECTED NOT DETECTED Final   Enterobacter cloacae complex NOT DETECTED NOT DETECTED Final   Escherichia coli NOT DETECTED NOT DETECTED Final   Klebsiella oxytoca NOT DETECTED NOT DETECTED Final   Klebsiella pneumoniae NOT DETECTED NOT DETECTED Final   Proteus species NOT DETECTED NOT DETECTED Final   Serratia marcescens NOT DETECTED NOT DETECTED Final   Carbapenem resistance NOT DETECTED NOT DETECTED Final   Haemophilus influenzae NOT DETECTED NOT DETECTED Final  Neisseria meningitidis NOT DETECTED NOT DETECTED Final   Pseudomonas aeruginosa NOT DETECTED NOT DETECTED Final   Candida albicans NOT DETECTED NOT DETECTED Final   Candida glabrata NOT DETECTED NOT DETECTED Final   Candida krusei NOT DETECTED NOT DETECTED Final   Candida parapsilosis NOT DETECTED NOT DETECTED Final   Candida tropicalis NOT DETECTED NOT DETECTED Final  Urine culture     Status: None   Collection Time: 10/15/15  3:35 PM  Result Value Ref Range Status   Specimen Description URINE, RANDOM  Final   Special Requests NONE  Final   Culture NO GROWTH Performed at [facility]   Final   Report Status 10/16/2015 FINAL  Final  MRSA PCR Screening     Status: None   Collection Time: 10/15/15  6:32 PM  Result Value Ref Range Status   MRSA by PCR NEGATIVE NEGATIVE Final    Comment:        The GeneXpert MRSA Assay (FDA approved for NASAL specimens only), is one component of a comprehensive MRSA colonization surveillance program. It is not intended to diagnose MRSA infection nor to guide or monitor treatment for MRSA infections.       Scheduled Meds: . budesonide (PULMICORT) nebulizer solution  0.5 mg Nebulization BID  . clonazePAM  1 mg Oral BID  . DULoxetine  60 mg Oral BID  . enoxaparin (LOVENOX) injection  40 mg Subcutaneous Q24H  . insulin aspart  0-15 Units Subcutaneous TID WC  . insulin aspart  0-5 Units Subcutaneous QHS  . insulin glargine  12  Units Subcutaneous QHS  . ipratropium-albuterol  3 mL Nebulization Q6H  . levofloxacin  750 mg Oral Daily  . mirtazapine  45 mg Oral QHS  . nicotine  21 mg Transdermal Daily  . oxyCODONE  20 mg Oral Q12H  . predniSONE  40 mg Oral Q breakfast  . risperiDONE  2 mg Oral QHS  . roflumilast  500 mcg Oral Daily  . senna-docusate  1 tablet Oral BID  . sodium chloride flush  3 mL Intravenous Q12H    Assessment/Plan:  1. Acute on chronic hypoxic respiratory failure. With respiratory distress,Continue current therapy with nebulizers oral antibiotics 2. Acute on chronic COPD exacerbation. predinosone Added budesonide nebulizers. Continue DuoNeb nebulizers. Continue Levaquin. Chest x-ray negative 3. Systemic inflammatory response syndrome. Likely vital signs abnormalities because of acute respiratory failure and COPD exacerbation. Unfortunately positive blood culture with staph species.that's a contamination. No antibiotics 4. Tachycardia- start Cardizem 5. Chronic pain- contniue medications 6. Type 2 diabetes- sliding scale.  Continue lantus 7. Anxiety/depression- continue medications  Disposition likely discharge home tomorrow  Code Status: Patient reversed code status to full code    Code Status Orders        Start     Ordered   10/15/15 1644  Do not attempt resuscitation (DNR)  Continuous    Question Answer Comment  In the event of cardiac or respiratory ARREST Do not call a "code blue"   In the event of cardiac or respiratory ARREST Do not perform Intubation, CPR, defibrillation or ACLS   In the event of cardiac or respiratory ARREST Use medication by any route, position, wound care, and other measures to relive pain and suffering. May use oxygen, suction and manual treatment of airway obstruction as needed for comfort.      10/15/15 1643    Code Status History    Date Active Date Inactive Code Status Order ID Comments User Context  10/15/2015  4:43 PM 10/15/2015  4:43 PM Full Code  [id number]  Lytle Butte, MD ED   06/26/2015 10:13 AM 06/28/2015  6:05 PM DNR [id number]  Bettey Costa, MD Inpatient   06/26/2015  6:05 AM 06/26/2015 10:13 AM Full Code [id number]  Saundra Shelling, MD Inpatient   06/08/2015 12:15 AM 06/10/2015  4:32 PM Full Code [id number]  Saundra Shelling, MD Inpatient    Advance Directive Documentation   [place] Most Recent Value  Type of Advance Directive  Living will  Pre-existing out of facility DNR order (yellow form or pink MOST form)  No data  "MOST" Form in Place?  No data     Disposition Plan: TBD  Antibiotics:  Levaquin  Vancomycin  Time spent: 81mnutes, critical care time.  Case discused with Patient, nursing, patients mother and critical care specialist  [facility] [facility]

## 2015-10-22 NOTE — Clinical Social Work Note (Signed)
MSW spoke to Lavada Mesi [phone] in regards to patient returning back to [facility].  Tommie Sams said she can accept patient back once he is medically ready for discharge and orders have been received.  Patient will need EMS transport per facility request due to patient's oxygen.  MSW to continue to follow patient's progress throughout discharge planning.  Jones Broom. Loui [place], MSW [phone]  Mon-Fri 8a-4:30p 10/22/2015 3:29 PM

## 2015-10-22 NOTE — Evaluation (Signed)
Physical Therapy Evaluation Patient Details Name: Craig Wright MRN: [id number] DOB: 04-Feb-1966 Today's Date: 10/22/2015   History of Present Illness  Pt is a 50 y.o. male admitted on 10/15/2015 with pneumonia and possible bacteremia/ AECOPD, bronchitis. Transferred temporarily to ICU with pulm edema and resp distress, placed on bipap.  Pt returned to telemetry floor on 5LO2.  Clinical Impression  Pt presents with deficits in gait, mobility, transfers, strength, and activity tolerance.  Pt's resting HR 112 bpm, nursing notified and gave OK to work with pt while monitoring HR.  Pt able to sit at EOB with SBA and HR to 120 bpm, minimal SOB.  Pt able to amb 10' with RW and CGA with mod SOB and HR to 134 bpm.  Pt returned to sup with EOB elevated with pt's HR and SOB returning to baseline in < 1 min.  Pt will benefit from PT services to address the above issues for decreased caregiver assistance upon discharge.       Follow Up Recommendations Home health PT    Equipment Recommendations       Recommendations for Other Services       Precautions / Restrictions Precautions Precautions: Fall Restrictions Weight Bearing Restrictions: No      Mobility  Bed Mobility Overal bed mobility: Needs Assistance Bed Mobility: Supine to Sit;Sit to Supine     Supine to sit: Supervision Sit to supine: Supervision   General bed mobility comments: Extra time needed to complete bed mobility tasks but no physical assistance required  Transfers Overall transfer level: Needs assistance Equipment used: Rolling walker (2 wheeled) Transfers: Sit to/from Stand Sit to Stand: Min guard         General transfer comment: Min verbal cues for sequencing  Ambulation/Gait Ambulation/Gait assistance: Min guard Ambulation Distance (Feet): 10 Feet Assistive device: Rolling walker (2 wheeled)          Stairs            Wheelchair Mobility    Modified Rankin (Stroke Patients Only)        Balance Overall balance assessment: Needs assistance   Sitting balance-Leahy Scale: Good       Standing balance-Leahy Scale: Fair                               Pertinent Vitals/Pain Pain Assessment: 0-10 Pain Score: 7  Pain Location: back and hips Pain Intervention(s): Premedicated before session    [place] expects to be discharged to:: Assisted living               Home Equipment: Walker - 2 wheels Additional Comments: Pt lives at [facility]    Prior Function Level of Independence: Independent         Comments: Pt reports ind with Amb in ALF without AD and able to manage pushing O2 tank, ind with ADLs     Hand Dominance   Dominant Hand: Right    Extremity/Trunk Assessment   Upper Extremity Assessment: Overall WFL for tasks assessed           Lower Extremity Assessment: Overall WFL for tasks assessed (LLE foot drop, chronic per pt)         Communication   Communication: No difficulties  Cognition Arousal/Alertness: Awake/alert Behavior During Therapy: WFL for tasks assessed/performed Overall Cognitive Status: Within Functional Limits for tasks assessed  General Comments      Exercises Total Joint Exercises Ankle Circles/Pumps: AROM;Both;20 reps Quad Sets: AROM;Both;15 reps Gluteal Sets: AROM;Both;10 reps Long Arc Quad: AROM;Both;10 reps Knee Flexion: AROM;Both;10 reps Other Exercises Other Exercises: Energy conservation and HEP education provided      Assessment/Plan    PT Assessment Patient needs continued PT services  PT Diagnosis Difficulty walking;Generalized weakness   PT Problem List Decreased strength;Decreased activity tolerance;Decreased balance;Decreased mobility  PT Treatment Interventions DME instruction;Gait training;Functional mobility training;Therapeutic activities;Therapeutic exercise;Balance training;Neuromuscular  re-education;Patient/family education   PT Goals (Current goals can be found in the Care Plan section) Acute Rehab PT Goals Patient Stated Goal: Improved endurance PT Goal Formulation: With patient Time For Goal Achievement: 11/04/15 Potential to Achieve Goals: Fair    Frequency Min 2X/week   Barriers to discharge        Co-evaluation               End of Session Equipment Utilized During Treatment: Gait belt;Oxygen Activity Tolerance: Patient limited by fatigue Patient left: in bed;with call bell/phone within reach;with family/visitor present;with bed alarm set Nurse Communication: Other (comment) (Nsg educated on pt's baseline HR and response to activity)         Time: 9444-6190 PT Time Calculation (min) (ACUTE ONLY): 31 min   Charges:   PT Evaluation $PT Eval Moderate Complexity: 1 Procedure PT Treatments $Therapeutic Exercise: 8-22 mins   PT G Codes:        DRoyetta Asal PT, DPT 10/22/15, 12:34 PM

## 2015-10-22 NOTE — Progress Notes (Signed)
Inpatient Diabetes Program Recommendations  AACE/ADA: New Consensus Statement on Inpatient Glycemic Control (2015)  Target Ranges:  Prepandial:   less than 140 mg/dL      Peak postprandial:   less than 180 mg/dL (1-2 hours)      Critically ill patients:  140 - 180 mg/dL   Results for SENAI, KINGSLEY (MRN [id number]) as of 10/22/2015 09:10  Ref. Range 10/21/2015 07:04 10/21/2015 11:10 10/21/2015 16:56 10/21/2015 21:20  Glucose-Capillary Latest Ref Range: 65 - 99 mg/dL 216 (H) 329 (H) 196 (H) 148 (H)   Results for LEODIS, ALCOCER (MRN [id number]) as of 10/22/2015 09:10  Ref. Range 10/22/2015 07:36  Glucose-Capillary Latest Ref Range: 65 - 99 mg/dL 84    Home DM Meds: Metformin 500 mg daily  Current Insulin Orders: Lantus 12 units QHS      Novolog Moderate Correction Scale/ SSI (0-15 units) TID AC + HS      -Note patient received last dose IV Solumedrol at midnight on 08/28.  -Now getting Prednisone 40 mg daily.  -Fasting glucose much lower this AM: CBG 84 mg/dl.    MD- Please consider the following in-hospital insulin adjustments:  1. Reduce Lantus to 8 units QHS  2. Reduce Novolog SSI to Sensitive scale (0-9 units) TID AC + HS (currently ordered as Moderate scale 0-15 units)  3. Start Novolog Meal Coverage: Novolog 3 units tid with meals (hold if pt eats <50% of meal)     --Will follow patient during hospitalization--  Wyn Quaker RN, MSN, CDE Diabetes Coordinator Inpatient Glycemic Control Team Team Pager: [phone] (8a-5p)

## 2015-10-22 NOTE — Progress Notes (Signed)
Patient is awake and alert. Off C-Pap.  O2 sat WNL.  Is tachycardic with any movement in the room, sitting up, using the urinal, etc.  Pulse elevates to the 140s.

## 2015-10-23 LAB — GLUCOSE, CAPILLARY
GLUCOSE-CAPILLARY: 90 mg/dL (ref 65–99)
GLUCOSE-CAPILLARY: 397 mg/dL — AB (ref 65–99)

## 2015-10-23 MED ORDER — PREDNISONE 10 MG PO TABS: 10.0000 mg | ORAL_TABLET | Freq: Every day | ORAL | 0 refills | Status: DC

## 2015-10-23 MED ORDER — DILTIAZEM HCL 120 MG PO TABS: 120.0000 mg | ORAL_TABLET | Freq: Four times a day (QID) | ORAL | 0 refills | Status: DC

## 2015-10-23 NOTE — Progress Notes (Addendum)
Discharge instructions given to patient and RN from group home, [facility]. IV and tele removed. Prescriptions given to patient along with education on respiratory failure. Verbalized understanding. Home oxygen has been delivered to the room. Belongings obtained from lock box by security and sent with patient. Signed portable DNR sent with patient.  Patient came into the hospital with his home oxygen tank and tank cannot be located at this time. Attempted calling ER and checked on 1C where patient was admitted, tank was not found. Patient's oxygen company notified by care management and another tank had to be delivered.

## 2015-10-23 NOTE — Care Management (Signed)
Informed by primary nurse that patient had a portabe 02 tank upon presentation to [facility] and now unable to locate.  Left voicemail message for Lavada Mesi that if facility is transporting back to facility, will need to bring a portable tank

## 2015-10-23 NOTE — Care Management (Addendum)
TC to [facility] and ordered a portable tank for discharge to be delivered to the room.

## 2015-10-23 NOTE — Discharge Summary (Addendum)
Craig Wright, 50 y.o., DOB 16-Feb-1966, MRN [id number]. Admission date: 10/15/2015 Discharge Date 10/23/2015 Primary MD No PCP Per Patient Admitting Physician Lytle Butte, MD  Admission Diagnosis  Chronic respiratory failure with hypoxia ([place]) [J96.11] Sepsis, due to unspecified organism [facility]) [A41.9]  Discharge Diagnosis   Active Problems:   Acute on chronic respiratory failure with hypoxia ([place])   Chronic respiratory failure with hypoxia ([place])   Palliative care by specialist   DNR (do not resuscitate)   Weakness generalized   Fever   Anxiety disorder   Sinus tachycardia   Central hypertension         Hospital Course patient is a 50 year old white male with COPD with chronic respiratory failure on 5 L of oxygen at baseline presented with shortness of breath. And cough. Patient initially was admitted to the floor and started on therapy for acute on chronic COPD exasperation. However he continued to complain of worsening shortness of breath he was tachypnea tachycardic therefore had to be transferred to the ICU. He was placed on a BiPAP. He was DO NOT RESUSCITATE however he changes his mind to being full code briefly and then subsequently was changed back to DO NOT RESUSCITATE. Patient was cared for by the intensivist for a few days during the hospitalization. He was treated with nebulizers steroids and antibiotics. Currently his breathing is back to baseline. Patient has a very poor prognosis. Home hospice was offered to the patient and his mother but they did not want that so home health is currently being arranged. Also to note patient did have positive blood culture for staph coag negative which was felt to be due to contamination.           Consults  pulmonary/intensive care  Significant Tests:  See full reports for all details     Dg Chest 1 View  Result Date: 10/20/2015 CLINICAL DATA:  Shortness of breath. EXAM: CHEST 1 VIEW COMPARISON:  10/19/2015 FINDINGS: Mild  hyperinflation/COPD. Heart is borderline in size. No confluent airspace opacities or effusions. No acute bony abnormality. IMPRESSION: COPD.  No change. Electronically Signed   By: Rolm Baptise M.D.   On: 10/20/2015 07:55   Dg Chest 1 View  Result Date: 10/19/2015 CLINICAL DATA:  Acute on chronic respiratory failure with hypoxemia EXAM: CHEST 1 VIEW COMPARISON:  10/18/2015 FINDINGS: COPD with pulmonary hyperinflation. Negative for infiltrate or effusion. Negative for heart failure. No change from the prior study. IMPRESSION: COPD.  No acute consolidation and no interval change. Electronically Signed   By: Franchot Gallo M.D.   On: 10/19/2015 07:17   Dg Chest 1 View  Result Date: 10/18/2015 CLINICAL DATA:  Acute on chronic respiratory failure with hypoxemia. COPD. EXAM: CHEST 1 VIEW COMPARISON:  10/15/2015 FINDINGS: The heart size and mediastinal contours are within normal limits. Pulmonary hyperinflation again demonstrated. No evidence of pulmonary infiltrate or edema. No evidence of pneumothorax or pleural effusion. Surgical clips again seen within the right axilla in skin staples in the right chest wall. IMPRESSION: Stable pulmonary hyperinflation, consistent with history of COPD. No acute findings. Electronically Signed   By: Earle Gell M.D.   On: 10/18/2015 09:22   Dg Chest Port 1 View  Result Date: 10/15/2015 CLINICAL DATA:  Productive cough, fever, shortness of breath. EXAM: PORTABLE CHEST 1 VIEW COMPARISON:  Radiograph Jun 26, 2015. FINDINGS: The heart size and mediastinal contours are within normal limits. No pneumothorax or pleural effusion is noted. Stable scarring is noted in both lung bases.  No acute pulmonary disease is noted. The visualized skeletal structures are unremarkable. IMPRESSION: No acute cardiopulmonary abnormality seen. Electronically Signed   By: Marijo Conception, M.D.   On: 10/15/2015 15:33       Today   Subjective:   Elmon Shader  feeling better breathing improved  close to baseline  Objective:   Blood pressure 131/84, pulse (!) 114, temperature 97.9 F (36.6 C), temperature source Oral, resp. rate 20, height 6' (1.829 m), weight 90.4 kg (199 lb 4.7 oz), SpO2 91 %.  .  Intake/Output Summary (Last 24 hours) at 10/23/15 1248 Last data filed at 10/23/15 1211  Gross per 24 hour  Intake              940 ml  Output             3500 ml  Net            -2560 ml    Exam VITAL SIGNS: Blood pressure 131/84, pulse (!) 114, temperature 97.9 F (36.6 C), temperature source Oral, resp. rate 20, height 6' (1.829 m), weight 90.4 kg (199 lb 4.7 oz), SpO2 91 %.  GENERAL:  50 y.o.-year-old patient lying in the bed with no acute distress.  EYES: Pupils equal, round, reactive to light and accommodation. No scleral icterus. Extraocular muscles intact.  HEENT: Head atraumatic, normocephalic. Oropharynx and nasopharynx clear.  NECK:  Supple, no jugular venous distention. No thyroid enlargement, no tenderness.  LUNGS: Decreased breath sounds bilaterally no sensory muscle usage CARDIOVASCULAR: S1, S2 normal. No murmurs, rubs, or gallops.  ABDOMEN: Soft, nontender, nondistended. Bowel sounds present. No organomegaly or mass.  EXTREMITIES: No pedal edema, cyanosis, or clubbing.  NEUROLOGIC: Cranial nerves II through XII are intact. Muscle strength 5/5 in all extremities. Sensation intact. Gait not checked.  PSYCHIATRIC: The patient is alert and oriented x 3.  SKIN: No obvious rash, lesion, or ulcer.   Data Review     CBC w Diff: Lab Results  Component Value Date   WBC 4.2 10/21/2015   HGB 13.4 10/21/2015   HGB 15.8 12/26/2012   HCT 39.1 (L) 10/21/2015   HCT 46.4 12/26/2012   PLT 124 (L) 10/21/2015   PLT 205 12/26/2012   LYMPHOPCT 14 10/15/2015   LYMPHOPCT 5.5 07/30/2012   MONOPCT 20 10/15/2015   MONOPCT 1.0 07/30/2012   EOSPCT 1 10/15/2015   EOSPCT 0.0 07/30/2012   BASOPCT 1 10/15/2015   BASOPCT 0.1 07/30/2012   CMP: Lab Results  Component Value Date    NA 139 10/21/2015   NA 132 (L) 12/26/2012   K 4.5 10/21/2015   K 4.4 12/26/2012   CL 91 (L) 10/21/2015   CL 99 12/26/2012   CO2 45 (H) 10/21/2015   CO2 35 (H) 12/26/2012   BUN 18 10/21/2015   BUN 7 12/26/2012   CREATININE 0.41 (L) 10/22/2015   CREATININE 0.74 12/26/2012   PROT 6.9 10/15/2015   PROT 7.9 12/26/2012   ALBUMIN 3.4 (L) 10/15/2015   ALBUMIN 4.1 12/26/2012   BILITOT 0.5 10/15/2015   BILITOT 0.4 12/26/2012   ALKPHOS 53 10/15/2015   ALKPHOS 69 12/26/2012   AST 89 (H) 10/15/2015   AST 31 12/26/2012   ALT 195 (H) 10/15/2015   ALT 38 12/26/2012  .  Micro Results Recent Results (from the past 240 hour(s))  Blood Culture (routine x 2)     Status: None   Collection Time: 10/15/15  1:53 PM  Result Value Ref Range Status   Specimen  Description BLOOD RIGHT WRIST  Final   Special Requests BOTTLES DRAWN AEROBIC AND ANAEROBIC [place]  Final   Culture NO GROWTH 5 DAYS  Final   Report Status 10/20/2015 FINAL  Final  Blood Culture (routine x 2)     Status: Abnormal   Collection Time: 10/15/15  1:53 PM  Result Value Ref Range Status   Specimen Description BLOOD RIGHT ASSIST CONTROL  Final   Special Requests BOTTLES DRAWN AEROBIC AND ANAEROBIC 2CCAERO,5CCANA  Final   Culture  Setup Time   Final    GRAM POSITIVE COCCI AEROBIC BOTTLE ONLY CRITICAL RESULT CALLED TO, READ BACK BY AND VERIFIED WITHVioleta Gelinas Merrill AT 1535 10/16/15 MSS. CONFIRMED BY QSD    Culture (A)  Final    STAPHYLOCOCCUS SPECIES (COAGULASE NEGATIVE) THE SIGNIFICANCE OF ISOLATING THIS ORGANISM FROM A SINGLE SET OF BLOOD CULTURES WHEN MULTIPLE SETS ARE DRAWN IS UNCERTAIN. PLEASE NOTIFY THE MICROBIOLOGY DEPARTMENT WITHIN ONE WEEK IF SPECIATION AND SENSITIVITIES ARE REQUIRED. Performed at [facility]    Report Status 10/18/2015 FINAL  Final  Blood Culture ID Panel (Reflexed)     Status: Abnormal   Collection Time: 10/15/15  1:53 PM  Result Value Ref Range Status   Enterococcus species NOT  DETECTED NOT DETECTED Final   Vancomycin resistance NOT DETECTED NOT DETECTED Final   Listeria monocytogenes NOT DETECTED NOT DETECTED Final   Staphylococcus species DETECTED (A) NOT DETECTED Final    Comment: CRITICAL RESULT CALLED TO, READ BACK BY AND VERIFIED WITHVioleta Gelinas Lamar AT 6389 10/16/15 MSS.    Staphylococcus aureus NOT DETECTED NOT DETECTED Final   Methicillin resistance DETECTED (A) NOT DETECTED Final    Comment: CRITICAL RESULT CALLED TO, READ BACK BY AND VERIFIED WITHVioleta Gelinas Durand AT 3734 10/16/15 MSS.    Streptococcus species NOT DETECTED NOT DETECTED Final   Streptococcus agalactiae NOT DETECTED NOT DETECTED Final   Streptococcus pneumoniae NOT DETECTED NOT DETECTED Final   Streptococcus pyogenes NOT DETECTED NOT DETECTED Final   Acinetobacter baumannii NOT DETECTED NOT DETECTED Final   Enterobacteriaceae species NOT DETECTED NOT DETECTED Final   Enterobacter cloacae complex NOT DETECTED NOT DETECTED Final   Escherichia coli NOT DETECTED NOT DETECTED Final   Klebsiella oxytoca NOT DETECTED NOT DETECTED Final   Klebsiella pneumoniae NOT DETECTED NOT DETECTED Final   Proteus species NOT DETECTED NOT DETECTED Final   Serratia marcescens NOT DETECTED NOT DETECTED Final   Carbapenem resistance NOT DETECTED NOT DETECTED Final   Haemophilus influenzae NOT DETECTED NOT DETECTED Final   Neisseria meningitidis NOT DETECTED NOT DETECTED Final   Pseudomonas aeruginosa NOT DETECTED NOT DETECTED Final   Candida albicans NOT DETECTED NOT DETECTED Final   Candida glabrata NOT DETECTED NOT DETECTED Final   Candida krusei NOT DETECTED NOT DETECTED Final   Candida parapsilosis NOT DETECTED NOT DETECTED Final   Candida tropicalis NOT DETECTED NOT DETECTED Final  Urine culture     Status: None   Collection Time: 10/15/15  3:35 PM  Result Value Ref Range Status   Specimen Description URINE, RANDOM  Final   Special Requests NONE  Final   Culture NO GROWTH Performed at [facility]   Final   Report Status 10/16/2015 FINAL  Final  MRSA PCR Screening     Status: None   Collection Time: 10/15/15  6:32 PM  Result Value Ref Range Status   MRSA by PCR NEGATIVE NEGATIVE Final    Comment:  The GeneXpert MRSA Assay (FDA approved for NASAL specimens only), is one component of a comprehensive MRSA colonization surveillance program. It is not intended to diagnose MRSA infection nor to guide or monitor treatment for MRSA infections.         Code Status Orders        Start     Ordered   10/20/15 1052  Do not attempt resuscitation (DNR)  Continuous    Question Answer Comment  In the event of cardiac or respiratory ARREST Do not call a "code blue"   In the event of cardiac or respiratory ARREST Do not perform Intubation, CPR, defibrillation or ACLS   In the event of cardiac or respiratory ARREST Use medication by any route, position, wound care, and other measures to relive pain and suffering. May use oxygen, suction and manual treatment of airway obstruction as needed for comfort.      10/20/15 1051    Code Status History    Date Active Date Inactive Code Status Order ID Comments User Context   10/19/2015  8:55 AM 10/20/2015 10:51 AM Full Code [id number]  Dustin Flock, MD Inpatient   10/18/2015 12:29 PM 10/19/2015  8:55 AM DNR [id number]  Laverle Hobby, MD Inpatient   10/18/2015  8:53 AM 10/18/2015 12:29 PM Full Code [id number]  Loletha Grayer, MD Inpatient   10/15/2015  4:43 PM 10/18/2015  8:53 AM DNR [id number]  Lytle Butte, MD ED   10/15/2015  4:43 PM 10/15/2015  4:43 PM Full Code [id number]  Lytle Butte, MD ED   06/26/2015 10:13 AM 06/28/2015  6:05 PM DNR [id number]  Bettey Costa, MD Inpatient   06/26/2015  6:05 AM 06/26/2015 10:13 AM Full Code [id number]  Saundra Shelling, MD Inpatient   06/08/2015 12:15 AM 06/10/2015  4:32 PM Full Code [id number]  Saundra Shelling, MD Inpatient    Advance Directive Documentation   Flowsheet Row Most Recent Value  Type of  Advance Directive  Living will  Pre-existing out of facility DNR order (yellow form or pink MOST form)  No data  "MOST" Form in Place?  No data            Discharge Medications     Medication List    STOP taking these medications   albuterol 108 (90 Base) MCG/ACT inhaler Commonly known as:  PROVENTIL HFA;VENTOLIN HFA     TAKE these medications   albuterol-ipratropium 18-103 MCG/ACT inhaler Commonly known as:  COMBIVENT Inhale 2 puffs into the lungs every 4 (four) hours as needed for wheezing or shortness of breath.   clonazePAM 1 MG tablet Commonly known as:  KLONOPIN Take 1 mg by mouth 2 (two) times daily.   diltiazem 120 MG tablet Commonly known as:  CARDIZEM Take 1 tablet (120 mg total) by mouth 4 (four) times daily.   DULoxetine 60 MG capsule Commonly known as:  CYMBALTA Take 60 mg by mouth 2 (two) times daily.   furosemide 20 MG tablet Commonly known as:  LASIX Take 40 mg by mouth daily.   metFORMIN 500 MG (MOD) 24 hr tablet Commonly known as:  GLUMETZA Take 500 mg by mouth daily with breakfast. Reported on 06/26/2015   mirtazapine 45 MG tablet Commonly known as:  REMERON Take 45 mg by mouth at bedtime.   mometasone-formoterol 100-5 MCG/ACT Aero Commonly known as:  DULERA Inhale 2 puffs into the lungs 2 (two) times daily.   OXYCONTIN 20 mg 12 hr tablet Generic drug:  oxyCODONE Take 20 mg by mouth  every 12 (twelve) hours.   Oxycodone HCl 10 MG Tabs Take 10 mg by mouth every 6 (six) hours as needed.   predniSONE 10 MG tablet Commonly known as:  DELTASONE Take 1 tablet (10 mg total) by mouth daily with breakfast. predinosone taper start with '60mg'$  taper by '10mg'$  until complete What changed:  how much to take  additional instructions   risperiDONE 2 MG tablet Commonly known as:  RISPERDAL Take 2 mg by mouth at bedtime.   roflumilast 500 MCG Tabs tablet Commonly known as:  DALIRESP Take 500 mcg by mouth daily.   tiotropium 18 MCG inhalation  capsule Commonly known as:  SPIRIVA Place 18 mcg into inhaler and inhale daily.          Total Time in preparing paper work, data evaluation and todays exam - 35 minutes  Dustin Flock M.D on 10/23/2015 at 12:48 PM  [facility]   Office  [phone]

## 2015-10-23 NOTE — Clinical Social Work Note (Signed)
Patient to be d/c'ed today to [facility].  Patient and family agreeable to plans will transport via facility transportation.  Evette Cristal, MSW Mon-Fri 8a-4:30p [phone]

## 2015-10-23 NOTE — Care Management (Signed)
[facility] arrived with new O2 tank for DC home

## 2015-10-24 ENCOUNTER — Encounter: Payer: Self-pay | Admitting: *Deleted

## 2015-10-24 ENCOUNTER — Emergency Department: Payer: Medicaid Other

## 2015-10-24 ENCOUNTER — Inpatient Hospital Stay
Admission: EM | Admit: 2015-10-24 | Discharge: 2015-10-27 | DRG: 191 | Disposition: A | Discharge status: Home / Self Care | Payer: Medicaid Other | Attending: Internal Medicine | Admitting: Internal Medicine

## 2015-10-24 DIAGNOSIS — Z800 Family history of malignant neoplasm of digestive organs: Secondary | ICD-10-CM | POA: Diagnosis not present

## 2015-10-24 DIAGNOSIS — J441 Chronic obstructive pulmonary disease with (acute) exacerbation: Secondary | ICD-10-CM | POA: Diagnosis present

## 2015-10-24 DIAGNOSIS — J9611 Chronic respiratory failure with hypoxia: Secondary | ICD-10-CM | POA: Diagnosis present

## 2015-10-24 DIAGNOSIS — F419 Anxiety disorder, unspecified: Secondary | ICD-10-CM | POA: Diagnosis present

## 2015-10-24 DIAGNOSIS — I10 Essential (primary) hypertension: Secondary | ICD-10-CM | POA: Diagnosis present

## 2015-10-24 DIAGNOSIS — F329 Major depressive disorder, single episode, unspecified: Secondary | ICD-10-CM | POA: Diagnosis present

## 2015-10-24 DIAGNOSIS — Z66 Do not resuscitate: Secondary | ICD-10-CM | POA: Diagnosis present

## 2015-10-24 DIAGNOSIS — F17200 Nicotine dependence, unspecified, uncomplicated: Secondary | ICD-10-CM | POA: Diagnosis present

## 2015-10-24 DIAGNOSIS — Z96642 Presence of left artificial hip joint: Secondary | ICD-10-CM | POA: Diagnosis present

## 2015-10-24 DIAGNOSIS — R0902 Hypoxemia: Secondary | ICD-10-CM | POA: Diagnosis present

## 2015-10-24 DIAGNOSIS — T380X5A Adverse effect of glucocorticoids and synthetic analogues, initial encounter: Secondary | ICD-10-CM | POA: Diagnosis present

## 2015-10-24 DIAGNOSIS — J209 Acute bronchitis, unspecified: Secondary | ICD-10-CM | POA: Diagnosis present

## 2015-10-24 DIAGNOSIS — Z8049 Family history of malignant neoplasm of other genital organs: Secondary | ICD-10-CM

## 2015-10-24 DIAGNOSIS — Z7984 Long term (current) use of oral hypoglycemic drugs: Secondary | ICD-10-CM

## 2015-10-24 DIAGNOSIS — M549 Dorsalgia, unspecified: Secondary | ICD-10-CM | POA: Diagnosis present

## 2015-10-24 DIAGNOSIS — G8921 Chronic pain due to trauma: Secondary | ICD-10-CM | POA: Diagnosis present

## 2015-10-24 DIAGNOSIS — J440 Chronic obstructive pulmonary disease with acute lower respiratory infection: Secondary | ICD-10-CM | POA: Diagnosis present

## 2015-10-24 DIAGNOSIS — E119 Type 2 diabetes mellitus without complications: Secondary | ICD-10-CM | POA: Diagnosis present

## 2015-10-24 DIAGNOSIS — Z9981 Dependence on supplemental oxygen: Secondary | ICD-10-CM

## 2015-10-24 DIAGNOSIS — R000 Tachycardia, unspecified: Secondary | ICD-10-CM | POA: Diagnosis present

## 2015-10-24 DIAGNOSIS — J449 Chronic obstructive pulmonary disease, unspecified: Secondary | ICD-10-CM | POA: Diagnosis present

## 2015-10-24 DIAGNOSIS — Z79899 Other long term (current) drug therapy: Secondary | ICD-10-CM | POA: Diagnosis not present

## 2015-10-24 LAB — GLUCOSE, CAPILLARY
GLUCOSE-CAPILLARY: 282 mg/dL — AB (ref 65–99)
Glucose-Capillary: 264 mg/dL — ABNORMAL HIGH (ref 65–99)

## 2015-10-24 LAB — COMPREHENSIVE METABOLIC PANEL
ALK PHOS: 57 U/L (ref 38–126)
ALT: 250 U/L — AB (ref 17–63)
ANION GAP: 6 (ref 5–15)
AST: 126 U/L — ABNORMAL HIGH (ref 15–41)
Albumin: 3.8 g/dL (ref 3.5–5.0)
BILIRUBIN TOTAL: 0.8 mg/dL (ref 0.3–1.2)
BUN: 10 mg/dL (ref 6–20)
CALCIUM: 8.8 mg/dL — AB (ref 8.9–10.3)
CO2: 42 mmol/L — AB (ref 22–32)
CREATININE: 0.56 mg/dL — AB (ref 0.61–1.24)
Chloride: 87 mmol/L — ABNORMAL LOW (ref 101–111)
GFR calc non Af Amer: >60 mL/min (ref >60)
Glucose, Bld: 275 mg/dL — ABNORMAL HIGH (ref 65–99)
Potassium: 4 mmol/L (ref 3.5–5.1)
SODIUM: 135 mmol/L (ref 135–145)
TOTAL PROTEIN: 7.1 g/dL (ref 6.5–8.1)

## 2015-10-24 LAB — CBC WITH DIFFERENTIAL/PLATELET
Basophils Absolute: 0 10*3/uL (ref 0–0.1)
Basophils Relative: 0 %
EOS ABS: 0.1 10*3/uL (ref 0–0.7)
Eosinophils Relative: 1 %
HEMATOCRIT: 46.4 % (ref 40.0–52.0)
HEMOGLOBIN: 15.8 g/dL (ref 13.0–18.0)
LYMPHS ABS: 0.8 10*3/uL — AB (ref 1.0–3.6)
LYMPHS PCT: 12 %
MCH: 29.8 pg (ref 26.0–34.0)
MCHC: 34.1 g/dL (ref 32.0–36.0)
MCV: 87.5 fL (ref 80.0–100.0)
MONOS PCT: 11 %
Monocytes Absolute: 0.7 10*3/uL (ref 0.2–1.0)
NEUTROS ABS: 5.1 10*3/uL (ref 1.4–6.5)
NEUTROS PCT: 76 %
Platelets: 132 10*3/uL — ABNORMAL LOW (ref 150–440)
RBC: 5.31 MIL/uL (ref 4.40–5.90)
RDW: 13.9 % (ref 11.5–14.5)
WBC: 6.7 10*3/uL (ref 3.8–10.6)

## 2015-10-24 LAB — BRAIN NATRIURETIC PEPTIDE: B NATRIURETIC PEPTIDE 5: 30 pg/mL (ref 0.0–100.0)

## 2015-10-24 LAB — MRSA PCR SCREENING: MRSA BY PCR: NEGATIVE

## 2015-10-24 LAB — TROPONIN I: Troponin I: <0.03 ng/mL (ref <0.03)

## 2015-10-24 MED ORDER — ONDANSETRON HCL 4 MG PO TABS: 4.0000 mg | ORAL_TABLET | Freq: Four times a day (QID) | ORAL | Status: DC | PRN

## 2015-10-24 MED ORDER — OXYCODONE HCL ER 10 MG PO T12A
20.0000 mg | EXTENDED_RELEASE_TABLET | Freq: Two times a day (BID) | ORAL | Status: DC
  Administered 2015-10-24 – 2015-10-27 (×6): 20 mg via ORAL
  Filled 2015-10-24 (×6): qty 2

## 2015-10-24 MED ORDER — AZITHROMYCIN 500 MG PO TABS
500.0000 mg | ORAL_TABLET | Freq: Every day | ORAL | Status: DC
  Administered 2015-10-24 – 2015-10-25 (×2): 500 mg via ORAL
  Filled 2015-10-24 (×2): qty 1

## 2015-10-24 MED ORDER — METFORMIN HCL ER 500 MG PO TB24
500.0000 mg | ORAL_TABLET | Freq: Two times a day (BID) | ORAL | Status: DC
  Administered 2015-10-24 – 2015-10-25 (×2): 500 mg via ORAL
  Filled 2015-10-24 (×2): qty 1

## 2015-10-24 MED ORDER — ACETAMINOPHEN 325 MG PO TABS: 650.0000 mg | ORAL_TABLET | ORAL | Status: DC | PRN

## 2015-10-24 MED ORDER — MOMETASONE FURO-FORMOTEROL FUM 100-5 MCG/ACT IN AERO
2.0000 | INHALATION_SPRAY | Freq: Two times a day (BID) | RESPIRATORY_TRACT | Status: DC
  Administered 2015-10-24 – 2015-10-27 (×6): 2 via RESPIRATORY_TRACT
  Filled 2015-10-24: qty 8.8

## 2015-10-24 MED ORDER — FUROSEMIDE 40 MG PO TABS
40.0000 mg | ORAL_TABLET | Freq: Every day | ORAL | Status: DC
  Administered 2015-10-25 – 2015-10-27 (×3): 40 mg via ORAL
  Filled 2015-10-24 (×3): qty 1

## 2015-10-24 MED ORDER — SENNA 8.6 MG PO TABS: 2.0000 | ORAL_TABLET | Freq: Every day | ORAL | Status: DC | PRN

## 2015-10-24 MED ORDER — ACETAMINOPHEN 325 MG PO TABS: 650.0000 mg | ORAL_TABLET | Freq: Four times a day (QID) | ORAL | Status: DC | PRN

## 2015-10-24 MED ORDER — INSULIN ASPART 100 UNIT/ML ~~LOC~~ SOLN
0.0000 [IU] | Freq: Three times a day (TID) | SUBCUTANEOUS | Status: DC
  Administered 2015-10-24: 5 [IU] via SUBCUTANEOUS
  Administered 2015-10-25: 7 [IU] via SUBCUTANEOUS
  Administered 2015-10-25: 3 [IU] via SUBCUTANEOUS
  Administered 2015-10-25: 7 [IU] via SUBCUTANEOUS
  Administered 2015-10-26: 2 [IU] via SUBCUTANEOUS
  Administered 2015-10-26: 9 [IU] via SUBCUTANEOUS
  Administered 2015-10-26: 5 [IU] via SUBCUTANEOUS
  Administered 2015-10-27: 2 [IU] via SUBCUTANEOUS
  Filled 2015-10-24: qty 7
  Filled 2015-10-24: qty 2
  Filled 2015-10-24 (×2): qty 5
  Filled 2015-10-24: qty 9
  Filled 2015-10-24: qty 3
  Filled 2015-10-24: qty 7
  Filled 2015-10-24: qty 2

## 2015-10-24 MED ORDER — MIRTAZAPINE 15 MG PO TABS
45.0000 mg | ORAL_TABLET | Freq: Every day | ORAL | Status: DC
  Administered 2015-10-24 – 2015-10-26 (×3): 45 mg via ORAL
  Filled 2015-10-24 (×3): qty 3

## 2015-10-24 MED ORDER — DOCUSATE SODIUM 100 MG PO CAPS
100.0000 mg | ORAL_CAPSULE | Freq: Two times a day (BID) | ORAL | Status: DC
  Administered 2015-10-24 – 2015-10-26 (×6): 100 mg via ORAL
  Filled 2015-10-24 (×6): qty 1

## 2015-10-24 MED ORDER — CLONAZEPAM 0.5 MG PO TABS
1.0000 mg | ORAL_TABLET | Freq: Two times a day (BID) | ORAL | Status: DC
  Administered 2015-10-24 – 2015-10-27 (×6): 1 mg via ORAL
  Filled 2015-10-24 (×6): qty 2

## 2015-10-24 MED ORDER — NICOTINE 21 MG/24HR TD PT24
21.0000 mg | MEDICATED_PATCH | Freq: Every day | TRANSDERMAL | Status: DC
  Administered 2015-10-25 – 2015-10-27 (×3): 21 mg via TRANSDERMAL
  Filled 2015-10-24 (×3): qty 1

## 2015-10-24 MED ORDER — POLYETHYLENE GLYCOL 3350 17 G PO PACK
17.0000 g | PACK | Freq: Every day | ORAL | Status: DC
  Filled 2015-10-24: qty 1

## 2015-10-24 MED ORDER — METHYLPREDNISOLONE SODIUM SUCC 125 MG IJ SOLR
60.0000 mg | Freq: Three times a day (TID) | INTRAMUSCULAR | Status: DC
  Administered 2015-10-24 – 2015-10-27 (×8): 60 mg via INTRAVENOUS
  Filled 2015-10-24 (×8): qty 2

## 2015-10-24 MED ORDER — MORPHINE SULFATE (CONCENTRATE) 10 MG/0.5ML PO SOLN
5.0000 mg | ORAL | Status: DC | PRN
  Administered 2015-10-25: 5 mg via ORAL
  Filled 2015-10-24: qty 1

## 2015-10-24 MED ORDER — INSULIN ASPART 100 UNIT/ML ~~LOC~~ SOLN
0.0000 [IU] | Freq: Every day | SUBCUTANEOUS | Status: DC
Start: 2015-10-24 — End: 2015-10-27
  Administered 2015-10-24 – 2015-10-26 (×2): 3 [IU] via SUBCUTANEOUS
  Filled 2015-10-24 (×2): qty 3

## 2015-10-24 MED ORDER — ACETAMINOPHEN 650 MG RE SUPP: 650.0000 mg | Freq: Four times a day (QID) | RECTAL | Status: DC | PRN

## 2015-10-24 MED ORDER — ALBUTEROL SULFATE (2.5 MG/3ML) 0.083% IN NEBU: 2.5000 mg | INHALATION_SOLUTION | RESPIRATORY_TRACT | Status: DC | PRN

## 2015-10-24 MED ORDER — IPRATROPIUM-ALBUTEROL 0.5-2.5 (3) MG/3ML IN SOLN
3.0000 mL | RESPIRATORY_TRACT | Status: DC
  Administered 2015-10-24 – 2015-10-26 (×10): 3 mL via RESPIRATORY_TRACT
  Filled 2015-10-24 (×11): qty 3

## 2015-10-24 MED ORDER — TIOTROPIUM BROMIDE MONOHYDRATE 18 MCG IN CAPS
18.0000 ug | ORAL_CAPSULE | Freq: Every day | RESPIRATORY_TRACT | Status: DC
Start: 2015-10-25 — End: 2015-10-27
  Administered 2015-10-25 – 2015-10-27 (×3): 18 ug via RESPIRATORY_TRACT
  Filled 2015-10-24: qty 5

## 2015-10-24 MED ORDER — METHYLPREDNISOLONE SODIUM SUCC 125 MG IJ SOLR
125.0000 mg | Freq: Once | INTRAMUSCULAR | Status: AC
  Administered 2015-10-24: 125 mg via INTRAVENOUS
  Filled 2015-10-24: qty 2

## 2015-10-24 MED ORDER — ENOXAPARIN SODIUM 40 MG/0.4ML ~~LOC~~ SOLN
40.0000 mg | SUBCUTANEOUS | Status: DC
  Administered 2015-10-24 – 2015-10-26 (×3): 40 mg via SUBCUTANEOUS
  Filled 2015-10-24 (×3): qty 0.4

## 2015-10-24 MED ORDER — DULOXETINE HCL 60 MG PO CPEP
60.0000 mg | ORAL_CAPSULE | Freq: Two times a day (BID) | ORAL | Status: DC
  Administered 2015-10-24 – 2015-10-26 (×5): 60 mg via ORAL
  Filled 2015-10-24 (×6): qty 1

## 2015-10-24 MED ORDER — ROFLUMILAST 500 MCG PO TABS
500.0000 ug | ORAL_TABLET | Freq: Every day | ORAL | Status: DC
  Administered 2015-10-25 – 2015-10-27 (×3): 500 ug via ORAL
  Filled 2015-10-24 (×3): qty 1

## 2015-10-24 MED ORDER — DILTIAZEM HCL 30 MG PO TABS
120.0000 mg | ORAL_TABLET | Freq: Four times a day (QID) | ORAL | Status: DC
  Administered 2015-10-24 – 2015-10-27 (×11): 120 mg via ORAL
  Filled 2015-10-24 (×12): qty 4

## 2015-10-24 MED ORDER — OXYCODONE HCL 5 MG PO TABS
10.0000 mg | ORAL_TABLET | Freq: Four times a day (QID) | ORAL | Status: DC | PRN
  Administered 2015-10-24 – 2015-10-26 (×7): 10 mg via ORAL
  Filled 2015-10-24 (×7): qty 2

## 2015-10-24 MED ORDER — BUPROPION HCL ER (SR) 100 MG PO TB12
100.0000 mg | ORAL_TABLET | ORAL | Status: DC
  Administered 2015-10-25 – 2015-10-27 (×3): 100 mg via ORAL
  Filled 2015-10-24 (×4): qty 1

## 2015-10-24 MED ORDER — ONDANSETRON HCL 4 MG/2ML IJ SOLN: 4.0000 mg | Freq: Four times a day (QID) | INTRAMUSCULAR | Status: DC | PRN

## 2015-10-24 MED ORDER — RISPERIDONE 1 MG PO TABS
2.0000 mg | ORAL_TABLET | Freq: Every day | ORAL | Status: DC
  Administered 2015-10-24 – 2015-10-26 (×3): 2 mg via ORAL
  Filled 2015-10-24 (×3): qty 2
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
## 2015-10-24 NOTE — Progress Notes (Signed)
Pt arrived via stretcher from the Er, in no distress, assissted in transfer to thebed. O2 on via NRB at 15l,this writer paged respiratory therapy to address this. Pt stated tha he is a co2 retainer, sat on arrival is 100%. Skin is warm and dry, intact. Lungs qith expiratory wheezes bilat, respirations are unlabored and shallow. S1S2 auscultated, abdomen is soft, bs heard.ppp, no edema noted. Pt immediately on arrival asked for pain medicine, pt received pain med per MD order. PIV #20 intact to top of R hand. Meal was ordered for pt, pt stated he would have stayed home but he couldn't breathe, pt denied sob on arrival, denied nasal congestion. Pt oriented to room and call bell.

## 2015-10-24 NOTE — ED Provider Notes (Signed)
[facility] Emergency Department Provider Note  ____________________________________________   I have reviewed the triage vital signs and the nursing notes.   HISTORY  Chief Complaint Shortness of Breath    HPI Craig Wright is a 50 y.o. male was discharged yesterday from this hospital. Patient has a history of end-stage COPD on 5 L nasal cannula at baseline. He states he went home and became acutely short of breath last night. He had a gradual difficulty with his home oxygen. His sats were 84% when EMS catheter. He denies productive cough. However he was significantly wheezing when they arrived. They did give him albuterol, we changed him to a facemask urine he is doing much better. Denies chest pain or leg swelling.      Past Medical History:  Diagnosis Date  . Anxiety disorder   . Asthma   . COPD (chronic obstructive pulmonary disease) ([place])   . Diabetes mellitus without complication ([place])   . Hypertension     Patient Active Problem List   Diagnosis Date Noted  . Chronic respiratory failure with hypoxia ([place])   . Palliative care by specialist   . DNR (do not resuscitate)   . Weakness generalized   . Acute on chronic respiratory failure with hypoxia ([place]) 10/15/2015  . Dyspnea 06/07/2015    Past Surgical History:  Procedure Laterality Date  . KNEE SURGERY Left   . ORTHOPEDIC SURGERY     multiple  . SKIN GRAFT    . TOTAL HIP ARTHROPLASTY Left     Prior to Admission medications   Medication Sig Start Date End Date Taking? Authorizing Provider  acetaminophen (TYLENOL) 325 MG tablet Take 650 mg by mouth every 4 (four) hours as needed.   Yes Historical Provider, MD  albuterol (PROVENTIL) (2.5 MG/3ML) 0.083% nebulizer solution Take 2.5 mg by nebulization every 2 (two) hours as needed for wheezing or shortness of breath.   Yes Historical Provider, MD  albuterol-ipratropium (COMBIVENT) 18-103 MCG/ACT inhaler Inhale 2 puffs into the lungs  every 4 (four) hours as needed for wheezing or shortness of breath.   Yes Historical Provider, MD  buPROPion (WELLBUTRIN SR) 100 MG 12 hr tablet Take 100 mg by mouth every morning.   Yes Historical Provider, MD  clonazePAM (KLONOPIN) 1 MG tablet Take 1 mg by mouth 2 (two) times daily.   Yes Historical Provider, MD  diltiazem (CARDIZEM) 120 MG tablet Take 1 tablet (120 mg total) by mouth 4 (four) times daily. 10/23/15  Yes Dustin Flock, MD  DULoxetine (CYMBALTA) 60 MG capsule Take 60 mg by mouth 2 (two) times daily.    Yes Historical Provider, MD  furosemide (LASIX) 40 MG tablet Take 40 mg by mouth daily.    Yes Historical Provider, MD  metFORMIN (GLUMETZA) 500 MG (MOD) 24 hr tablet Take 500 mg by mouth 2 (two) times daily. Reported on 06/26/2015   Yes Historical Provider, MD  mirtazapine (REMERON) 45 MG tablet Take 45 mg by mouth at bedtime.   Yes Historical Provider, MD  mometasone-formoterol (DULERA) 100-5 MCG/ACT AERO Inhale 2 puffs into the lungs 2 (two) times daily. 06/28/15  Yes Fritzi Mandes, MD  nicotine (NICODERM CQ - DOSED IN MG/24 HOURS) 21 mg/24hr patch Place 21 mg onto the skin daily.   Yes Historical Provider, MD  oxyCODONE (OXYCONTIN) 20 mg 12 hr tablet Take 20 mg by mouth every 12 (twelve) hours.   Yes Historical Provider, MD  Oxycodone HCl 10 MG TABS Take 10 mg by mouth  every 6 (six) hours as needed.   Yes Historical Provider, MD  polyethylene glycol (MIRALAX / GLYCOLAX) packet Take 17 g by mouth daily.   Yes Historical Provider, MD  predniSONE (DELTASONE) 10 MG tablet Take 1 tablet (10 mg total) by mouth daily with breakfast. predinosone taper start with '60mg'$  taper by '10mg'$  until complete Patient taking differently: Take 10 mg by mouth daily as needed. Take 1 tablet daily as needed for respiration 10/23/15  Yes Dustin Flock, MD  risperiDONE (RISPERDAL) 2 MG tablet Take 2 mg by mouth at bedtime.   Yes Historical Provider, MD  roflumilast (DALIRESP) 500 MCG TABS tablet Take 500 mcg by mouth  daily.   Yes Historical Provider, MD  senna (SENNA-LAX) 8.6 MG tablet Take 2 tablets by mouth daily as needed for constipation.   Yes Historical Provider, MD  tiotropium (SPIRIVA) 18 MCG inhalation capsule Place 18 mcg into inhaler and inhale daily.   Yes Historical Provider, MD    Allergies Review of patient's allergies indicates no known allergies.  Family History  Problem Relation Age of Onset  . Cervical cancer Mother   . Colon cancer Father     Social History Social History  Substance Use Topics  . Smoking status: Current Some Day Smoker  . Smokeless tobacco: Never Used  . Alcohol use 0.0 oz/week    Review of Systems Constitutional: No fever/chills Eyes: No visual changes. ENT: No sore throat. No stiff neck no neck pain Cardiovascular: Denies chest pain. Respiratory: Positive shortness of breath. Gastrointestinal:   no vomiting.  No diarrhea.  No constipation. Genitourinary: Negative for dysuria. Musculoskeletal: Negative lower extremity swelling Skin: Negative for rash. Neurological: Negative for severe headaches, focal weakness or numbness. 10-point ROS otherwise negative.  ____________________________________________   PHYSICAL EXAM:  VITAL SIGNS: ED Triage Vitals  Enc Vitals Group     BP 10/24/15 0950 137/84     Pulse Rate 10/24/15 0950 (!) 134     Resp 10/24/15 0950 (!) 24     Temp 10/24/15 0950 98.7 F (37.1 C)     Temp Source 10/24/15 0950 Oral     SpO2 10/24/15 0946 (!) 83 %     Weight 10/24/15 0947 199 lb (90.3 kg)     Height 10/24/15 0947 6' (1.829 m)     Head Circumference --      Peak Flow --      Pain Score 10/24/15 0948 0     Pain Loc --      Pain Edu? --      Excl. in [place]? --     Constitutional: Alert and oriented. Not in distress at this time  Eyes: Conjunctivae are normal. PERRL. EOMI. Head: Atraumatic. Nose: No congestion/rhinnorhea. Mouth/Throat: Mucous membranes are moist.  Oropharynx non-erythematous. Neck: No stridor.    Nontender with no meningismus Cardiovascular: Normal rate, regular rhythm. Grossly normal heart sounds.  Good peripheral circulation. Respiratory: Normal respiratory effort.Mild tachypnea noted, occasional slight wheeze but good air motion on the facemask Abdominal: Soft and nontender. No distention. No guarding no rebound Back:  There is no focal tenderness or step off.  there is no midline tenderness there are no lesions noted. there is no CVA tenderness Musculoskeletal: No lower extremity tenderness, no upper extremity tenderness. No joint effusions, no DVT signs strong distal pulses no edema Neurologic:  Normal speech and language. No gross focal neurologic deficits are appreciated.  Skin:  Skin is warm, dry and intact. No rash noted. Psychiatric: Mood and affect are  normal. Speech and behavior are normal.  ____________________________________________   LABS (all labs ordered are listed, but only abnormal results are displayed)  Labs Reviewed  CBC WITH DIFFERENTIAL/PLATELET - Abnormal; Notable for the following:       Result Value   Platelets 132 (*)    Lymphs Abs 0.8 (*)    All other components within normal limits  COMPREHENSIVE METABOLIC PANEL - Abnormal; Notable for the following:    Chloride 87 (*)    CO2 42 (*)    Glucose, Bld 275 (*)    Creatinine, Ser 0.56 (*)    Calcium 8.8 (*)    AST 126 (*)    ALT 250 (*)    All other components within normal limits  CULTURE, BLOOD (ROUTINE X 2)  CULTURE, BLOOD (ROUTINE X 2)  TROPONIN I  BRAIN NATRIURETIC PEPTIDE   ____________________________________________  EKG  I personally interpreted any EKGs ordered by me or triage Sinus tachycardia no acute ST elevation or depression rate 127 ____________________________________________  RADIOLOGY  I reviewed any imaging ordered by me or triage that were performed during my shift and, if possible, patient and/or family made aware of any abnormal  findings. ____________________________________________   PROCEDURES  Procedure(s) performed: None  Procedures  Critical Care performed: None  ____________________________________________   INITIAL IMPRESSION / ASSESSMENT AND PLAN / ED COURSE  Pertinent labs & imaging results that were available during my care of the patient were reviewed by me and considered in my medical decision making (see chart for details).  Patient presents today with significant hypoxia terms of breath on his home oxygen. Patient has had difficulty with his home oxygen apparently since discharge yesterday. There is some question as to whether he was able to breathe through his nose he feels congested. Chest x-ray and vital signs are reassuring thus far here. We did give him oral oxygen as a posterior nasal knees doing better. Patient does not however safe for discharge home. I did restart his steroids there is no indication for acute intubation and I do not see indication for advancing antibiotics at this time.  Clinical Course   ____________________________________________   FINAL CLINICAL IMPRESSION(S) / ED DIAGNOSES  Final diagnoses:  None      This chart was dictated using voice recognition software.  Despite best efforts to proofread,  errors can occur which can change meaning.      Schuyler Amor, MD 10/24/15 1126

## 2015-10-24 NOTE — Clinical Social Work Note (Signed)
Clinical Social Work Assessment  Patient Details  Name: Craig Wright MRN: [id number] Date of Birth: 02-27-1965  Date of referral:  10/24/15               Reason for consult:  Other (Comment Required) (From [facility]. )                Permission sought to share information with:  Chartered certified accountant granted to share information::  Yes, Verbal Permission Granted  Name::      [place]::     Relationship::     Contact Information:     Housing/Transportation Living arrangements for the past 2 months:  [place] of Information:  Patient, Facility, [place], Parent Patient Interpreter Needed:  None Criminal Activity/Legal Involvement Pertinent to Current Situation/Hospitalization:  No - Comment as needed Significant Relationships:  Parents Lives with:  Facility Resident Do you feel safe going back to the place where you live?  Yes Need for family participation in patient care:  Yes (Comment)  Care giving concerns:  Patient is a resident at [facility] (fax: [phone]) located at [address] [place], [place] [address]. Phone [phone].     Social Worker assessment / plan:  Patient was discharged from [facility] on yesterday 10/23/15 back to [facility]. Per patient he could not breath and started having anxiety last night and called EMS to bring him back to [facility]. Patient was readmitted to [facility] today. CSW met with patient alone at bedside. Patient is currently on a non-re breather oxygen mask. Patient is on 5 liters oxygen at baseline. Patient reported that his mother Craig Wright is his HPOA and gave CSW permission to call her. Per patient he would like to return to [facility] with [place]. CSW briefly dicussed hospice and patient reported that he is not interested in hospice services at this time. CSW contacted patient's mother Craig Wright. Per mother patient was living with her  and had hospice services however he fired them because he did not like them. Per mother the hospice services were not helping patient. Mother did report that one good thing the hospice agency did was assist patient in getting into [facility]. Per mother patient has been a [employer] since January 2017. Mother is agreeable for patient to return to [facility] and reported that she may be able to transport.  CSW left [place] owner. CSW also spoke to [place] group home staff member. Per [place] patient can walk independently at baseline and is on oxygen. Per [place] patient can return to group home when stable. [place] reported that the group home can try to transport but if they can't he may need to come back via EMS or his mom may transport. CSW will continue to follow and assist as needed.   Employment status:  Disabled (Comment on whether or not currently receiving Disability) Insurance information:  Medicaid [place] PT Recommendations:  Not assessed at this time Information / Referral to community resources:  Other (Comment Required) (Patient will return to [facility]. )  Patient/Family's Response to care:  Patient is not interested in hospice services at this time and wants to return to [facility] with [place].   Patient/Family's Understanding of and Emotional Response to Diagnosis, Current Treatment, and Prognosis:  Patient and his mother were very pleasant and thanked  CSW for assistance.   Emotional Assessment Appearance:  Appears stated age Attitude/Demeanor/Rapport:    Affect (typically observed):  Accepting, Adaptable, Pleasant Orientation:  Oriented to Self, Oriented to Place, Oriented to  Time, Oriented to Situation Alcohol / Substance use:  Not Applicable Psych involvement (Current and /or in the community):  No (Comment)  Discharge Needs  Concerns to be addressed:  Discharge Planning Concerns Readmission within the last 30 days:   Yes Current discharge risk:  Chronically ill Barriers to Discharge:  Continued Medical Work up   [employer], Veronia Beets, LCSW 10/24/2015, 3:21 PM

## 2015-10-24 NOTE — ED Notes (Signed)
EDP at bedside  

## 2015-10-24 NOTE — ED Triage Notes (Signed)
Pt arrives via EMS from home with complaints of SOB, states he was just discharged from hospital yesterday for COPD exacerbation, 5L [place] at home, states SOB started last night, EMs gave 2 breathing txs in route

## 2015-10-24 NOTE — ED Notes (Signed)
Pt 86% on 5L [place], pt states nasal congestion, pt placed on NRB

## 2015-10-24 NOTE — ED Notes (Signed)
Pt resting in bed, eyes closed, resp even and unlabored, pt on NRB, pt given ice chips

## 2015-10-24 NOTE — NC FL2 (Signed)
[place] LEVEL OF CARE SCREENING TOOL     IDENTIFICATION  Patient Name: Craig Wright Birthdate: 1965-03-26 Sex: male Admission Date (Current Location): 10/24/2015  [facility] and [place] Number:  [name]  ([id number]) Facility and Address:  [facility], [place] [address], [place], [place] [address]      Provider Number: [id number]  Attending Physician Name and Address:  Gladstone Lighter, MD  Relative Name and Phone Number:       Current Level of Care: Hospital Recommended Level of Care: [facility] Prior Approval Number:    Date Approved/Denied:   PASRR Number:  ( [id number] )  Discharge Plan: Domiciliary (Rest home)    Current Diagnoses: Patient Active Problem List   Diagnosis Date Noted  . COPD exacerbation ([place]) 10/24/2015  . Chronic respiratory failure with hypoxia ([place])   . Palliative care by specialist   . DNR (do not resuscitate)   . Weakness generalized   . Acute on chronic respiratory failure with hypoxia ([place]) 10/15/2015  . Dyspnea 06/07/2015    Orientation RESPIRATION BLADDER Height & Weight     Self, Time, Situation, Place  O2 (5 Liters Oxygen Chronic ) Continent Weight: 186 lb 3.2 oz (84.5 kg) Height:  6' (182.9 cm)  BEHAVIORAL SYMPTOMS/MOOD NEUROLOGICAL BOWEL NUTRITION STATUS   (none)  (none) Continent Diet (Diet: 2 Grams Sodium. )  AMBULATORY STATUS COMMUNICATION OF NEEDS Skin   Limited Assist Verbally Normal                       Personal Care Assistance Level of Assistance  Bathing, Feeding, Dressing Bathing Assistance: Limited assistance Feeding assistance: Independent Dressing Assistance: Limited assistance     Functional Limitations Info  Sight, Hearing, Speech Sight Info: Adequate Hearing Info: Adequate Speech Info: Adequate    SPECIAL CARE FACTORS FREQUENCY                       Contractures      Additional Factors Info  Code Status, Allergies, Psychotropic,  Insulin Sliding Scale Code Status Info:  (DNR ) Allergies Info:  (No Known Allergies. )   Insulin Sliding Scale Info:  (NovoLog )       Current Medications (10/24/2015):  This is the current hospital active medication list Current Facility-Administered Medications  Medication Dose Route Frequency Provider Last Rate Last Dose  . acetaminophen (TYLENOL) tablet 650 mg  650 mg Oral Q6H PRN Gladstone Lighter, MD       Or  . acetaminophen (TYLENOL) suppository 650 mg  650 mg Rectal Q6H PRN Gladstone Lighter, MD      . albuterol (PROVENTIL) (2.5 MG/3ML) 0.083% nebulizer solution 2.5 mg  2.5 mg Nebulization Q2H PRN Gladstone Lighter, MD      . azithromycin (ZITHROMAX) tablet 500 mg  500 mg Oral Daily Gladstone Lighter, MD   500 mg at 10/24/15 1345  . [START ON 10/25/2015] buPROPion (WELLBUTRIN SR) 12 hr tablet 100 mg  100 mg Oral BH-q7a Gladstone Lighter, MD      . clonazePAM [place]bye [place]) tablet 1 mg  1 mg Oral BID Gladstone Lighter, MD      . diltiazem (CARDIZEM) tablet 120 mg  120 mg Oral QID Gladstone Lighter, MD   120 mg at 10/24/15 1345  . docusate sodium (COLACE) capsule 100 mg  100 mg Oral BID Gladstone Lighter, MD   100 mg at 10/24/15 1345  . DULoxetine (CYMBALTA) DR capsule 60 mg  60 mg Oral BID Gladstone Lighter, MD      . enoxaparin (LOVENOX) injection 40 mg  40 mg Subcutaneous Q24H Gladstone Lighter, MD      . Derrill Memo ON 10/25/2015] furosemide (LASIX) tablet 40 mg  40 mg Oral Daily Gladstone Lighter, MD      . insulin aspart (novoLOG) injection 0-5 Units  0-5 Units Subcutaneous QHS Gladstone Lighter, MD      . insulin aspart (novoLOG) injection 0-9 Units  0-9 Units Subcutaneous TID WC Gladstone Lighter, MD      . ipratropium-albuterol (DUONEB) 0.5-2.5 (3) MG/3ML nebulizer solution 3 mL  3 mL Nebulization Q4H Gladstone Lighter, MD      . metFORMIN (GLUCOPHAGE-XR) 24 hr tablet 500 mg  500 mg Oral BID WC Gladstone Lighter, MD      . methylPREDNISolone sodium succinate (SOLU-MEDROL) 125 mg/2 mL  injection 60 mg  60 mg Intravenous Q8H Gladstone Lighter, MD      . mirtazapine (REMERON) tablet 45 mg  45 mg Oral QHS Gladstone Lighter, MD      . mometasone-formoterol (DULERA) 100-5 MCG/ACT inhaler 2 puff  2 puff Inhalation BID Gladstone Lighter, MD      . morphine CONCENTRATE 10 MG/0.5ML oral solution 5 mg  5 mg Oral Q2H PRN Gladstone Lighter, MD      . Derrill Memo ON 10/25/2015] nicotine (NICODERM CQ - dosed in mg/24 hours) patch 21 mg  21 mg Transdermal Daily Gladstone Lighter, MD      . ondansetron (ZOFRAN) tablet 4 mg  4 mg Oral Q6H PRN Gladstone Lighter, MD       Or  . ondansetron (ZOFRAN) injection 4 mg  4 mg Intravenous Q6H PRN Gladstone Lighter, MD      . oxyCODONE (Oxy IR/ROXICODONE) immediate release tablet 10 mg  10 mg Oral Q6H PRN Gladstone Lighter, MD   10 mg at 10/24/15 1345  . oxyCODONE (OXYCONTIN) 12 hr tablet 20 mg  20 mg Oral Q12H Gladstone Lighter, MD      . Derrill Memo ON 10/25/2015] polyethylene glycol (MIRALAX / GLYCOLAX) packet 17 g  17 g Oral Daily Gladstone Lighter, MD      . risperiDONE (RISPERDAL) tablet 2 mg  2 mg Oral QHS Gladstone Lighter, MD      . Derrill Memo ON 10/25/2015] roflumilast (DALIRESP) tablet 500 mcg  500 mcg Oral Daily Gladstone Lighter, MD      . senna (SENOKOT) tablet 17.2 mg  2 tablet Oral Daily PRN Gladstone Lighter, MD      . Derrill Memo ON 10/25/2015] tiotropium (SPIRIVA) inhalation capsule 18 mcg  18 mcg Inhalation Daily Gladstone Lighter, MD         Discharge Medications: Please see discharge summary for a list of discharge medications.  Relevant Imaging Results:  Relevant Lab Results:   Additional Information  (SSN: [id number])  Neave Lenger, Veronia Beets, LCSW

## 2015-10-24 NOTE — ED Notes (Signed)
Pt tolerating NRB well, states more comfort, pt resp rate slowed down, mother at bedside, will continue to monitor closely

## 2015-10-25 LAB — BASIC METABOLIC PANEL
Anion gap: 5 (ref 5–15)
BUN: 15 mg/dL (ref 6–20)
CHLORIDE: 86 mmol/L — AB (ref 101–111)
CO2: 45 mmol/L — ABNORMAL HIGH (ref 22–32)
CREATININE: 0.53 mg/dL — AB (ref 0.61–1.24)
Calcium: 8.6 mg/dL — ABNORMAL LOW (ref 8.9–10.3)
GFR calc Af Amer: >60 mL/min (ref >60)
GLUCOSE: 228 mg/dL — AB (ref 65–99)
Potassium: 4.2 mmol/L (ref 3.5–5.1)
SODIUM: 136 mmol/L (ref 135–145)

## 2015-10-25 LAB — GLUCOSE, CAPILLARY
GLUCOSE-CAPILLARY: 325 mg/dL — AB (ref 65–99)
GLUCOSE-CAPILLARY: 163 mg/dL — AB (ref 65–99)
Glucose-Capillary: 225 mg/dL — ABNORMAL HIGH (ref 65–99)
Glucose-Capillary: 346 mg/dL — ABNORMAL HIGH (ref 65–99)

## 2015-10-25 LAB — CBC
HCT: 41.7 % (ref 40.0–52.0)
Hemoglobin: 14.2 g/dL (ref 13.0–18.0)
MCH: 29.5 pg (ref 26.0–34.0)
MCHC: 34.1 g/dL (ref 32.0–36.0)
MCV: 86.6 fL (ref 80.0–100.0)
PLATELETS: 114 10*3/uL — AB (ref 150–440)
RBC: 4.82 MIL/uL (ref 4.40–5.90)
RDW: 13.6 % (ref 11.5–14.5)
WBC: 4.8 10*3/uL (ref 3.8–10.6)

## 2015-10-25 MED ORDER — INSULIN GLARGINE 100 UNIT/ML ~~LOC~~ SOLN
10.0000 [IU] | Freq: Every day | SUBCUTANEOUS | Status: DC
  Administered 2015-10-25 – 2015-10-26 (×2): 10 [IU] via SUBCUTANEOUS
  Filled 2015-10-25 (×4): qty 0.1

## 2015-10-25 MED ORDER — METFORMIN HCL ER 500 MG PO TB24
1000.0000 mg | ORAL_TABLET | Freq: Two times a day (BID) | ORAL | Status: DC
  Administered 2015-10-25 – 2015-10-27 (×4): 1000 mg via ORAL
  Filled 2015-10-25 (×4): qty 2
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
## 2015-10-25 NOTE — Progress Notes (Signed)
Inpatient Diabetes Program Recommendations  AACE/ADA: New Consensus Statement on Inpatient Glycemic Control (2015)  Target Ranges:  Prepandial:   less than 140 mg/dL      Peak postprandial:   less than 180 mg/dL (1-2 hours)      Critically ill patients:  140 - 180 mg/dL  Results for CAELAN, BRANDEN (MRN [id number]) as of 10/25/2015 09:14  Ref. Range 10/24/2015 17:32 10/24/2015 20:57 10/25/2015 07:27  Glucose-Capillary Latest Ref Range: 65 - 99 mg/dL 264 (H) 282 (H) 225 (H)    Review of Glycemic Control  Diabetes history: DM2 Outpatient Diabetes medications: Metfomrin 500 mg BID Current orders for Inpatient glycemic control: Novolog 0-9 units TID with meals, Novolog 0-5 units QHS, Metformin XR 500 mg BID  Inpatient Diabetes Program Recommendations: Insulin - Basal: If steroids are continued, please consider ordering Lantus 10 units Q24H starting now. Insulin - Meal Coverage: If steroids are continued, please consider ordering Novolog 4 units TID with meals for meal coverage if patient is eating at least 50% of meals.  Thanks, Barnie Alderman, RN, MSN, CDE Diabetes Coordinator Inpatient Diabetes Program [phone] (Team Pager from [place] to [place]) [phone] ([place] office) [phone] [facility] office) [phone] [facility] office)

## 2015-10-25 NOTE — Care Management Note (Addendum)
Case Management Note  Patient Details  Name: Craig Wright MRN: [id number] Date of Birth: 07/04/1965  Subjective/Objective:    Case discussed with attending physician and also with CSW.   Discharge plan is for patient to return to group home at discharge and be followed by [place]. RN and Respiratory therapist. COPD protocol              Action/Plan: Referal placed with Edward Qualia at [facility]. CSW following.  Expected Discharge Date:                  Expected Discharge Plan:  Group Home  In-House Referral:  Clinical Social Work  Discharge planning Services  CM Consult  Post Acute Care Choice:    Choice offered to:     DME Arranged:    DME Agency:     HH Arranged:  RN, Respirator Therapy [place] Agency:  [place]  Status of Service:  In process, will continue to follow  If discussed at Long Length of Stay Meetings, dates discussed:    Additional Comments:  Alvie Heidelberg, RN 10/25/2015, 11:26 AM

## 2015-10-26 LAB — GLUCOSE, CAPILLARY
GLUCOSE-CAPILLARY: 188 mg/dL — AB (ref 65–99)
GLUCOSE-CAPILLARY: 363 mg/dL — AB (ref 65–99)
Glucose-Capillary: 300 mg/dL — ABNORMAL HIGH (ref 65–99)
Glucose-Capillary: 263 mg/dL — ABNORMAL HIGH (ref 65–99)

## 2015-10-26 MED ORDER — IPRATROPIUM-ALBUTEROL 0.5-2.5 (3) MG/3ML IN SOLN
3.0000 mL | Freq: Four times a day (QID) | RESPIRATORY_TRACT | Status: DC
  Administered 2015-10-26 – 2015-10-27 (×3): 3 mL via RESPIRATORY_TRACT
  Filled 2015-10-26 (×4): qty 3
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
## 2015-10-27 LAB — GLUCOSE, CAPILLARY: Glucose-Capillary: 197 mg/dL — ABNORMAL HIGH (ref 65–99)

## 2015-10-27 MED ORDER — METFORMIN HCL ER (MOD) 500 MG PO TB24: 1000.0000 mg | ORAL_TABLET | Freq: Two times a day (BID) | ORAL | 0 refills | Status: AC

## 2015-10-27 NOTE — Progress Notes (Addendum)
Patient's mother here to take him to his group home. She brought his oxygen for transport. CM packet given to mother. Nurse to call Mariann Laster at [facility] after 1pm to give report, per her request. IV removed, patient's belongings packed.  Nurse tech helped patient out.

## 2015-10-27 NOTE — Discharge Summary (Signed)
Craig Wright, 50 y.o., DOB 04-29-1965, MRN [id number]. Admission date: 10/24/2015 Discharge Date 10/27/2015 Primary MD No PCP Per Patient Admitting Physician Gladstone Lighter, MD  Admission Diagnosis  Hypoxia [R09.02] COPD exacerbation ([place]) [J44.1]  Discharge Diagnosis   Active Problems:   Acute on chronic respiratory failure with hypoxia ([place])   Chronic respiratory failure with hypoxia ([place])    DNR (do not resuscitate)   Weakness generalized   Anxiety disorder   Sinus tachycardia   Essential  hypertension         Hospital Course patient is a 50 year old white male with COPD with chronic respiratory failure on 5 L of oxygen at baseline presented with shortness of breath. Patient was just in the hospital and was discharged home day prior. He currently resides in a group home. Patient stated that he started having acute shortness of breath and once EMS gave and put a full face mask on him his symptoms resolved. He was again admitted for acute on chronic COPD exasperation. Treated with steroids nebulizers. Since he arrived on the floor from admission his respiratory status remained back to baseline. He was continued and monitored for 2 days in the hospital his breathing is back to baseline currently on 5 L. As noted during his previous hospitalization patient refused hospice services. His overall prognosis is very poor and is at very high risk of recurrent admissions to the hospital.          Consults  none  Significant Tests:  See full reports for all details     Dg Chest 1 View  Result Date: 10/20/2015 CLINICAL DATA:  Shortness of breath. EXAM: CHEST 1 VIEW COMPARISON:  10/19/2015 FINDINGS: Mild hyperinflation/COPD. Heart is borderline in size. No confluent airspace opacities or effusions. No acute bony abnormality. IMPRESSION: COPD.  No change. Electronically Signed   By: Rolm Baptise M.D.   On: 10/20/2015 07:55   Dg Chest 1 View  Result Date: 10/19/2015 CLINICAL DATA:   Acute on chronic respiratory failure with hypoxemia EXAM: CHEST 1 VIEW COMPARISON:  10/18/2015 FINDINGS: COPD with pulmonary hyperinflation. Negative for infiltrate or effusion. Negative for heart failure. No change from the prior study. IMPRESSION: COPD.  No acute consolidation and no interval change. Electronically Signed   By: Franchot Gallo M.D.   On: 10/19/2015 07:17   Dg Chest 1 View  Result Date: 10/18/2015 CLINICAL DATA:  Acute on chronic respiratory failure with hypoxemia. COPD. EXAM: CHEST 1 VIEW COMPARISON:  10/15/2015 FINDINGS: The heart size and mediastinal contours are within normal limits. Pulmonary hyperinflation again demonstrated. No evidence of pulmonary infiltrate or edema. No evidence of pneumothorax or pleural effusion. Surgical clips again seen within the right axilla in skin staples in the right chest wall. IMPRESSION: Stable pulmonary hyperinflation, consistent with history of COPD. No acute findings. Electronically Signed   By: Earle Gell M.D.   On: 10/18/2015 09:22   Dg Chest 2 View  Result Date: 10/24/2015 CLINICAL DATA:  50 year old who was discharged from the hospital yesterday after a COPD exacerbation, presenting with recurrent shortness of breath that began last night. Current smoker. Oxygen dependent COPD. Current history diabetes and hypertension. EXAM: CHEST  2 VIEW COMPARISON:  10/20/2015, 10/19/2014 and earlier, including CT chest 06/07/2015. FINDINGS: Cardiac silhouette normal in size, unchanged. Hilar and mediastinal contours unremarkable. Mild hyperinflation. Mildly prominent bronchovascular markings diffusely and mild central peribronchial thickening, unchanged since the examination 4 days ago to go to increased since examinations in 2014. Lungs otherwise clear. Pulmonary vascularity  normal. No pleural effusions. Degenerative changes involving the thoracic spine. Surgical clips in the right axilla. IMPRESSION: Mild changes of acute bronchitis and/or asthma without  focal airspace pneumonia. Electronically Signed   By: Evangeline Dakin M.D.   On: 10/24/2015 11:08   Dg Chest Port 1 View  Result Date: 10/15/2015 CLINICAL DATA:  Productive cough, fever, shortness of breath. EXAM: PORTABLE CHEST 1 VIEW COMPARISON:  Radiograph Jun 26, 2015. FINDINGS: The heart size and mediastinal contours are within normal limits. No pneumothorax or pleural effusion is noted. Stable scarring is noted in both lung bases. No acute pulmonary disease is noted. The visualized skeletal structures are unremarkable. IMPRESSION: No acute cardiopulmonary abnormality seen. Electronically Signed   By: Marijo Conception, M.D.   On: 10/15/2015 15:33       Today   Subjective:   Craig Wright  breathing back to baseline denies any complaints was to go home  Objective:   Blood pressure 121/60, pulse 70, temperature 97.8 F (36.6 C), temperature source Oral, resp. rate 18, height 6' (1.829 m), weight 186 lb 3.2 oz (84.5 kg), SpO2 100 %.  .  Intake/Output Summary (Last 24 hours) at 10/27/15 1004 Last data filed at 10/26/15 1700  Gross per 24 hour  Intake                0 ml  Output                0 ml  Net                0 ml    Exam VITAL SIGNS: Blood pressure 121/60, pulse 70, temperature 97.8 F (36.6 C), temperature source Oral, resp. rate 18, height 6' (1.829 m), weight 186 lb 3.2 oz (84.5 kg), SpO2 100 %.  GENERAL:  50 y.o.-year-old patient lying in the bed with no acute distress.  EYES: Pupils equal, round, reactive to light and accommodation. No scleral icterus. Extraocular muscles intact.  HEENT: Head atraumatic, normocephalic. Oropharynx and nasopharynx clear.  NECK:  Supple, no jugular venous distention. No thyroid enlargement, no tenderness.  LUNGS: Decreased breath sounds bilaterally no sensory muscle usage CARDIOVASCULAR: S1, S2 normal. No murmurs, rubs, or gallops.  ABDOMEN: Soft, nontender, nondistended. Bowel sounds present. No organomegaly or mass.  EXTREMITIES:  No pedal edema, cyanosis, or clubbing.  NEUROLOGIC: Cranial nerves II through XII are intact. Muscle strength 5/5 in all extremities. Sensation intact. Gait not checked.  PSYCHIATRIC: The patient is alert and oriented x 3.  SKIN: No obvious rash, lesion, or ulcer.   Data Review     CBC w Diff:  Lab Results  Component Value Date   WBC 4.8 10/25/2015   HGB 14.2 10/25/2015   HGB 15.8 12/26/2012   HCT 41.7 10/25/2015   HCT 46.4 12/26/2012   PLT 114 (L) 10/25/2015   PLT 205 12/26/2012   LYMPHOPCT 12 10/24/2015   LYMPHOPCT 5.5 07/30/2012   MONOPCT 11 10/24/2015   MONOPCT 1.0 07/30/2012   EOSPCT 1 10/24/2015   EOSPCT 0.0 07/30/2012   BASOPCT 0 10/24/2015   BASOPCT 0.1 07/30/2012   CMP:  Lab Results  Component Value Date   NA 136 10/25/2015   NA 132 (L) 12/26/2012   K 4.2 10/25/2015   K 4.4 12/26/2012   CL 86 (L) 10/25/2015   CL 99 12/26/2012   CO2 45 (H) 10/25/2015   CO2 35 (H) 12/26/2012   BUN 15 10/25/2015   BUN 7 12/26/2012   CREATININE 0.53 (  L) 10/25/2015   CREATININE 0.74 12/26/2012   PROT 7.1 10/24/2015   PROT 7.9 12/26/2012   ALBUMIN 3.8 10/24/2015   ALBUMIN 4.1 12/26/2012   BILITOT 0.8 10/24/2015   BILITOT 0.4 12/26/2012   ALKPHOS 57 10/24/2015   ALKPHOS 69 12/26/2012   AST 126 (H) 10/24/2015   AST 31 12/26/2012   ALT 250 (H) 10/24/2015   ALT 38 12/26/2012  .  Micro Results Recent Results (from the past 240 hour(s))  Culture, blood (routine x 2)     Status: None (Preliminary result)   Collection Time: 10/24/15 10:13 AM  Result Value Ref Range Status   Specimen Description BLOOD RIGHT ASSIST CONTROL  Final   Special Requests BOTTLES DRAWN AEROBIC AND ANAEROBIC 4CCAERO,2CCANA  Final   Culture NO GROWTH 3 DAYS  Final   Report Status PENDING  Incomplete  Culture, blood (routine x 2)     Status: None (Preliminary result)   Collection Time: 10/24/15 10:13 AM  Result Value Ref Range Status   Specimen Description BLOOD LEFT HAND  Final   Special Requests  BOTTLES DRAWN AEROBIC AND ANAEROBIC 6CCAERO,2CCANA  Final   Culture NO GROWTH 3 DAYS  Final   Report Status PENDING  Incomplete  MRSA PCR Screening     Status: None   Collection Time: 10/24/15  2:00 PM  Result Value Ref Range Status   MRSA by PCR NEGATIVE NEGATIVE Final    Comment:        The GeneXpert MRSA Assay (FDA approved for NASAL specimens only), is one component of a comprehensive MRSA colonization surveillance program. It is not intended to diagnose MRSA infection nor to guide or monitor treatment for MRSA infections.         Code Status Orders        Start     Ordered   10/20/15 1052  Do not attempt resuscitation (DNR)  Continuous    Question Answer Comment  In the event of cardiac or respiratory ARREST Do not call a "code blue"   In the event of cardiac or respiratory ARREST Do not perform Intubation, CPR, defibrillation or ACLS   In the event of cardiac or respiratory ARREST Use medication by any route, position, wound care, and other measures to relive pain and suffering. May use oxygen, suction and manual treatment of airway obstruction as needed for comfort.      10/20/15 1051    Code Status History    Date Active Date Inactive Code Status Order ID Comments User Context   10/19/2015  8:55 AM 10/20/2015 10:51 AM Full Code [id number]  Dustin Flock, MD Inpatient   10/18/2015 12:29 PM 10/19/2015  8:55 AM DNR [id number]  Laverle Hobby, MD Inpatient   10/18/2015  8:53 AM 10/18/2015 12:29 PM Full Code [id number]  Loletha Grayer, MD Inpatient   10/15/2015  4:43 PM 10/18/2015  8:53 AM DNR [id number]  Lytle Butte, MD ED   10/15/2015  4:43 PM 10/15/2015  4:43 PM Full Code [id number]  Lytle Butte, MD ED   06/26/2015 10:13 AM 06/28/2015  6:05 PM DNR [id number]  Bettey Costa, MD Inpatient   06/26/2015  6:05 AM 06/26/2015 10:13 AM Full Code [id number]  Saundra Shelling, MD Inpatient   06/08/2015 12:15 AM 06/10/2015  4:32 PM Full Code [id number]  Saundra Shelling, MD Inpatient    Advance  Directive Documentation   Flowsheet Row Most Recent Value  Type of Advance Directive  Living will  Pre-existing out of facility DNR order (yellow  form or pink MOST form)  No data  "MOST" Form in Place?  No data          Follow-up Information    pcp Follow up in 3 day(s).   Why:  has appointment next week          Discharge Medications     Medication List    TAKE these medications   acetaminophen 325 MG tablet Commonly known as:  TYLENOL Take 650 mg by mouth every 4 (four) hours as needed.   albuterol (2.5 MG/3ML) 0.083% nebulizer solution Commonly known as:  PROVENTIL Take 2.5 mg by nebulization every 2 (two) hours as needed for wheezing or shortness of breath.   albuterol-ipratropium 18-103 MCG/ACT inhaler Commonly known as:  COMBIVENT Inhale 2 puffs into the lungs every 4 (four) hours as needed for wheezing or shortness of breath.   buPROPion 100 MG 12 hr tablet Commonly known as:  WELLBUTRIN SR Take 100 mg by mouth every morning.   clonazePAM 1 MG tablet Commonly known as:  KLONOPIN Take 1 mg by mouth 2 (two) times daily.   diltiazem 120 MG tablet Commonly known as:  CARDIZEM Take 1 tablet (120 mg total) by mouth 4 (four) times daily.   DULoxetine 60 MG capsule Commonly known as:  CYMBALTA Take 60 mg by mouth 2 (two) times daily.   furosemide 40 MG tablet Commonly known as:  LASIX Take 40 mg by mouth daily.   metFORMIN 500 MG (MOD) 24 hr tablet Commonly known as:  GLUMETZA Take 2 tablets (1,000 mg total) by mouth 2 (two) times daily. Reported on 06/26/2015 What changed:  how much to take   mirtazapine 45 MG tablet Commonly known as:  REMERON Take 45 mg by mouth at bedtime.   mometasone-formoterol 100-5 MCG/ACT Aero Commonly known as:  DULERA Inhale 2 puffs into the lungs 2 (two) times daily.   nicotine 21 mg/24hr patch Commonly known as:  NICODERM CQ - dosed in mg/24 hours Place 21 mg onto the skin daily.   OXYCONTIN 20 mg 12 hr  tablet Generic drug:  oxyCODONE Take 20 mg by mouth every 12 (twelve) hours.   Oxycodone HCl 10 MG Tabs Take 10 mg by mouth every 6 (six) hours as needed.   polyethylene glycol packet Commonly known as:  MIRALAX / GLYCOLAX Take 17 g by mouth daily.   predniSONE 10 MG tablet Commonly known as:  DELTASONE Take 1 tablet (10 mg total) by mouth daily with breakfast. predinosone taper start with '60mg'$  taper by '10mg'$  until complete What changed:  when to take this  reasons to take this  additional instructions   risperiDONE 2 MG tablet Commonly known as:  RISPERDAL Take 2 mg by mouth at bedtime.   roflumilast 500 MCG Tabs tablet Commonly known as:  DALIRESP Take 500 mcg by mouth daily.   SENNA-LAX 8.6 MG tablet Generic drug:  senna Take 2 tablets by mouth daily as needed for constipation.   tiotropium 18 MCG inhalation capsule Commonly known as:  SPIRIVA Place 18 mcg into inhaler and inhale daily.          Total Time in preparing paper work, data evaluation and todays exam - 35 minutes  Dustin Flock M.D on 10/27/2015 at 10:04 AM  [facility]   Office  [phone]

## 2015-10-27 NOTE — Care Management Note (Signed)
Case Management Note  Patient Details  Name: Craig Wright MRN: [id number] Date of Birth: [birth year]  Subjective/Objective:   Discussed discharge planning with patient, [facility]-SW, and Craig Wright at [facility]. A home health referral has been faxed to [place] requesting RN and Respiratory Care services. [name]'s Mother has his portable oxygen tank and will use it while transporting [name]. PCP=Kwan Williams.                  Action/Plan:   Expected Discharge Date:                  Expected Discharge Plan:  Group Home  In-House Referral:  Clinical Social Work  Discharge planning Services  CM Consult  Post Acute Care Choice:    Choice offered to:     DME Arranged:    DME Agency:     HH Arranged:  RN, Respirator Therapy [place] Agency:  [place]  Status of Service:  In process, will continue to follow  If discussed at Long Length of Stay Meetings, dates discussed:    Additional Comments:  Aleiyah Halpin A, RN 10/27/2015, 9:26 AM

## 2015-10-27 NOTE — Discharge Instructions (Signed)
°  DIET:  Cardiac diet, diabetic diet  DISCHARGE CONDITION:  Stable  ACTIVITY:  Activity as tolerated  OXYGEN:  Home Oxygen: yes   Oxygen Delivery: 5 liters/min via Patient connected to nasal cannula oxygen  DISCHARGE LOCATION:  home    ADDITIONAL DISCHARGE INSTRUCTION:   If you experience worsening of your admission symptoms, develop shortness of breath, life threatening emergency, suicidal or homicidal thoughts you must seek medical attention immediately by calling 911 or calling your MD immediately  if symptoms less severe.  You Must read complete instructions/literature along with all the possible adverse reactions/side effects for all the Medicines you take and that have been prescribed to you. Take any new Medicines after you have completely understood and accpet all the possible adverse reactions/side effects.   Please note  You were cared for by a hospitalist during your hospital stay. If you have any questions about your discharge medications or the care you received while you were in the hospital after you are discharged, you can call the unit and asked to speak with the hospitalist on call if the hospitalist that took care of you is not available. Once you are discharged, your primary care physician will handle any further medical issues. Please note that NO REFILLS for any discharge medications will be authorized once you are discharged, as it is imperative that you return to your primary care physician (or establish a relationship with a primary care physician if you do not have one) for your aftercare needs so that they can reassess your need for medications and monitor your lab values.

## 2015-10-29 LAB — CULTURE, BLOOD (ROUTINE X 2)
CULTURE: NO GROWTH
Culture: NO GROWTH

## 2015-12-09 ENCOUNTER — Emergency Department: Payer: Medicaid Other

## 2015-12-09 ENCOUNTER — Inpatient Hospital Stay
Admission: EM | Admit: 2015-12-09 | Discharge: 2015-12-12 | DRG: 191 | Disposition: A | Discharge status: Home / Self Care | Payer: Medicaid Other | Attending: Internal Medicine | Admitting: Internal Medicine

## 2015-12-09 ENCOUNTER — Encounter: Payer: Self-pay | Admitting: Emergency Medicine

## 2015-12-09 DIAGNOSIS — R000 Tachycardia, unspecified: Secondary | ICD-10-CM | POA: Diagnosis present

## 2015-12-09 DIAGNOSIS — Z9981 Dependence on supplemental oxygen: Secondary | ICD-10-CM

## 2015-12-09 DIAGNOSIS — F329 Major depressive disorder, single episode, unspecified: Secondary | ICD-10-CM | POA: Diagnosis present

## 2015-12-09 DIAGNOSIS — I10 Essential (primary) hypertension: Secondary | ICD-10-CM | POA: Diagnosis present

## 2015-12-09 DIAGNOSIS — Z96642 Presence of left artificial hip joint: Secondary | ICD-10-CM | POA: Diagnosis present

## 2015-12-09 DIAGNOSIS — F17210 Nicotine dependence, cigarettes, uncomplicated: Secondary | ICD-10-CM | POA: Diagnosis present

## 2015-12-09 DIAGNOSIS — J9612 Chronic respiratory failure with hypercapnia: Secondary | ICD-10-CM | POA: Diagnosis present

## 2015-12-09 DIAGNOSIS — T502X5A Adverse effect of carbonic-anhydrase inhibitors, benzothiadiazides and other diuretics, initial encounter: Secondary | ICD-10-CM | POA: Diagnosis present

## 2015-12-09 DIAGNOSIS — E876 Hypokalemia: Secondary | ICD-10-CM | POA: Diagnosis present

## 2015-12-09 DIAGNOSIS — Z66 Do not resuscitate: Secondary | ICD-10-CM | POA: Diagnosis present

## 2015-12-09 DIAGNOSIS — J441 Chronic obstructive pulmonary disease with (acute) exacerbation: Secondary | ICD-10-CM

## 2015-12-09 DIAGNOSIS — E860 Dehydration: Secondary | ICD-10-CM | POA: Diagnosis present

## 2015-12-09 DIAGNOSIS — F419 Anxiety disorder, unspecified: Secondary | ICD-10-CM | POA: Diagnosis present

## 2015-12-09 DIAGNOSIS — Z79899 Other long term (current) drug therapy: Secondary | ICD-10-CM | POA: Diagnosis not present

## 2015-12-09 DIAGNOSIS — E119 Type 2 diabetes mellitus without complications: Secondary | ICD-10-CM | POA: Diagnosis present

## 2015-12-09 DIAGNOSIS — Z7951 Long term (current) use of inhaled steroids: Secondary | ICD-10-CM

## 2015-12-09 DIAGNOSIS — G8929 Other chronic pain: Secondary | ICD-10-CM | POA: Diagnosis present

## 2015-12-09 DIAGNOSIS — J449 Chronic obstructive pulmonary disease, unspecified: Secondary | ICD-10-CM | POA: Diagnosis present

## 2015-12-09 DIAGNOSIS — Z7984 Long term (current) use of oral hypoglycemic drugs: Secondary | ICD-10-CM

## 2015-12-09 DIAGNOSIS — R778 Other specified abnormalities of plasma proteins: Secondary | ICD-10-CM | POA: Diagnosis present

## 2015-12-09 DIAGNOSIS — R079 Chest pain, unspecified: Secondary | ICD-10-CM | POA: Diagnosis present

## 2015-12-09 DIAGNOSIS — J9611 Chronic respiratory failure with hypoxia: Secondary | ICD-10-CM | POA: Diagnosis present

## 2015-12-09 LAB — CBC WITH DIFFERENTIAL/PLATELET
BASOS PCT: 1 %
Basophils Absolute: 0 10*3/uL (ref 0–0.1)
EOS ABS: 0.1 10*3/uL (ref 0–0.7)
Eosinophils Relative: 2 %
HCT: 36.7 % — ABNORMAL LOW (ref 40.0–52.0)
HEMOGLOBIN: 12.7 g/dL — AB (ref 13.0–18.0)
LYMPHS ABS: 0.7 10*3/uL — AB (ref 1.0–3.6)
Lymphocytes Relative: 12 %
MCH: 30 pg (ref 26.0–34.0)
MCHC: 34.5 g/dL (ref 32.0–36.0)
MCV: 87 fL (ref 80.0–100.0)
MONO ABS: 0.7 10*3/uL (ref 0.2–1.0)
MONOS PCT: 11 %
NEUTROS PCT: 74 %
Neutro Abs: 4.3 10*3/uL (ref 1.4–6.5)
Platelets: 150 10*3/uL (ref 150–440)
RBC: 4.22 MIL/uL — ABNORMAL LOW (ref 4.40–5.90)
RDW: 14.5 % (ref 11.5–14.5)
WBC: 5.8 10*3/uL (ref 3.8–10.6)

## 2015-12-09 LAB — BRAIN NATRIURETIC PEPTIDE: B NATRIURETIC PEPTIDE 5: 34 pg/mL (ref 0.0–100.0)

## 2015-12-09 LAB — GLUCOSE, CAPILLARY
GLUCOSE-CAPILLARY: 314 mg/dL — AB (ref 65–99)
GLUCOSE-CAPILLARY: 280 mg/dL — AB (ref 65–99)
Glucose-Capillary: 230 mg/dL — ABNORMAL HIGH (ref 65–99)

## 2015-12-09 LAB — BLOOD GAS, VENOUS
Acid-Base Excess: 11.1 mmol/L — ABNORMAL HIGH (ref 0.0–2.0)
BICARBONATE: 38.4 mmol/L — AB (ref 20.0–28.0)
O2 SAT: 91 %
PATIENT TEMPERATURE: 37
PO2 VEN: 61 mmHg — AB (ref 32.0–45.0)
pCO2, Ven: 62 mmHg — ABNORMAL HIGH (ref 44.0–60.0)
pH, Ven: 7.4 (ref 7.250–7.430)

## 2015-12-09 LAB — BASIC METABOLIC PANEL
Anion gap: 6 (ref 5–15)
CALCIUM: 8.4 mg/dL — AB (ref 8.9–10.3)
CHLORIDE: 96 mmol/L — AB (ref 101–111)
CO2: 33 mmol/L — AB (ref 22–32)
CREATININE: 0.56 mg/dL — AB (ref 0.61–1.24)
GFR calc Af Amer: >60 mL/min (ref >60)
GFR calc non Af Amer: >60 mL/min (ref >60)
Glucose, Bld: 154 mg/dL — ABNORMAL HIGH (ref 65–99)
Potassium: 3.1 mmol/L — ABNORMAL LOW (ref 3.5–5.1)
SODIUM: 135 mmol/L (ref 135–145)

## 2015-12-09 LAB — TROPONIN I: Troponin I: 0.05 ng/mL (ref <0.03)

## 2015-12-09 LAB — MRSA PCR SCREENING: MRSA BY PCR: NEGATIVE

## 2015-12-09 MED ORDER — FUROSEMIDE 40 MG PO TABS: 40.0000 mg | ORAL_TABLET | Freq: Every day | ORAL | Status: DC

## 2015-12-09 MED ORDER — INFLUENZA VAC SPLIT QUAD 0.5 ML IM SUSY
0.5000 mL | PREFILLED_SYRINGE | INTRAMUSCULAR | Status: AC
  Administered 2015-12-12: 0.5 mL via INTRAMUSCULAR
  Filled 2015-12-09: qty 0.5

## 2015-12-09 MED ORDER — TIOTROPIUM BROMIDE MONOHYDRATE 18 MCG IN CAPS
18.0000 ug | ORAL_CAPSULE | Freq: Every morning | RESPIRATORY_TRACT | Status: DC
  Administered 2015-12-10 – 2015-12-12 (×3): 18 ug via RESPIRATORY_TRACT
  Filled 2015-12-09: qty 5

## 2015-12-09 MED ORDER — MIRTAZAPINE 15 MG PO TABS
45.0000 mg | ORAL_TABLET | Freq: Every day | ORAL | Status: DC
  Administered 2015-12-09 – 2015-12-11 (×3): 45 mg via ORAL
  Filled 2015-12-09 (×3): qty 3

## 2015-12-09 MED ORDER — IPRATROPIUM-ALBUTEROL 0.5-2.5 (3) MG/3ML IN SOLN: 3.0000 mL | RESPIRATORY_TRACT | Status: DC | PRN

## 2015-12-09 MED ORDER — METHYLPREDNISOLONE SODIUM SUCC 125 MG IJ SOLR
125.0000 mg | Freq: Once | INTRAMUSCULAR | Status: AC
  Administered 2015-12-09: 125 mg via INTRAVENOUS
  Filled 2015-12-09: qty 2

## 2015-12-09 MED ORDER — ONDANSETRON HCL 4 MG PO TABS: 4.0000 mg | ORAL_TABLET | Freq: Four times a day (QID) | ORAL | Status: DC | PRN

## 2015-12-09 MED ORDER — ONDANSETRON HCL 4 MG/2ML IJ SOLN: 4.0000 mg | Freq: Four times a day (QID) | INTRAMUSCULAR | Status: DC | PRN

## 2015-12-09 MED ORDER — SENNA 8.6 MG PO TABS: 2.0000 | ORAL_TABLET | Freq: Every day | ORAL | Status: DC | PRN

## 2015-12-09 MED ORDER — DILTIAZEM HCL ER COATED BEADS 180 MG PO CP24
180.0000 mg | ORAL_CAPSULE | Freq: Every day | ORAL | Status: DC
Start: 2015-12-09 — End: 2015-12-11
  Administered 2015-12-09 – 2015-12-11 (×3): 180 mg via ORAL
  Filled 2015-12-09 (×3): qty 1

## 2015-12-09 MED ORDER — ACETAMINOPHEN 325 MG PO TABS: 650.0000 mg | ORAL_TABLET | Freq: Four times a day (QID) | ORAL | Status: DC | PRN

## 2015-12-09 MED ORDER — INSULIN ASPART 100 UNIT/ML ~~LOC~~ SOLN
0.0000 [IU] | Freq: Three times a day (TID) | SUBCUTANEOUS | Status: DC
  Administered 2015-12-09: 8 [IU] via SUBCUTANEOUS
  Administered 2015-12-10 (×2): 3 [IU] via SUBCUTANEOUS
  Administered 2015-12-11 (×2): 5 [IU] via SUBCUTANEOUS
  Administered 2015-12-11 – 2015-12-12 (×3): 3 [IU] via SUBCUTANEOUS
  Filled 2015-12-09 (×2): qty 5
  Filled 2015-12-09 (×2): qty 3
  Filled 2015-12-09: qty 8
  Filled 2015-12-09 (×3): qty 3

## 2015-12-09 MED ORDER — DOCUSATE SODIUM 100 MG PO CAPS
100.0000 mg | ORAL_CAPSULE | Freq: Two times a day (BID) | ORAL | Status: DC
  Administered 2015-12-10 – 2015-12-12 (×4): 100 mg via ORAL
  Filled 2015-12-09 (×6): qty 1

## 2015-12-09 MED ORDER — POTASSIUM CHLORIDE 10 MEQ/100ML IV SOLN
10.0000 meq | INTRAVENOUS | Status: AC
  Administered 2015-12-09: 10 meq via INTRAVENOUS
  Filled 2015-12-09 (×5): qty 100

## 2015-12-09 MED ORDER — RISPERIDONE 1 MG PO TABS
2.0000 mg | ORAL_TABLET | Freq: Every day | ORAL | Status: DC
  Administered 2015-12-09 – 2015-12-11 (×3): 2 mg via ORAL
  Filled 2015-12-09 (×3): qty 2

## 2015-12-09 MED ORDER — ALBUTEROL SULFATE (2.5 MG/3ML) 0.083% IN NEBU
2.5000 mg | INHALATION_SOLUTION | RESPIRATORY_TRACT | Status: DC | PRN
  Filled 2015-12-09: qty 3

## 2015-12-09 MED ORDER — MUPIROCIN 2 % EX OINT
1.0000 "application " | TOPICAL_OINTMENT | Freq: Two times a day (BID) | CUTANEOUS | Status: DC
  Filled 2015-12-09: qty 22

## 2015-12-09 MED ORDER — ACETAMINOPHEN 650 MG RE SUPP: 650.0000 mg | Freq: Four times a day (QID) | RECTAL | Status: DC | PRN

## 2015-12-09 MED ORDER — DILTIAZEM HCL 60 MG PO TABS: 120.0000 mg | ORAL_TABLET | Freq: Four times a day (QID) | ORAL | Status: DC

## 2015-12-09 MED ORDER — METHYLPREDNISOLONE SODIUM SUCC 125 MG IJ SOLR
60.0000 mg | INTRAMUSCULAR | Status: DC
  Administered 2015-12-09 – 2015-12-10 (×2): 60 mg via INTRAVENOUS
  Filled 2015-12-09 (×2): qty 2

## 2015-12-09 MED ORDER — DULOXETINE HCL 60 MG PO CPEP
120.0000 mg | ORAL_CAPSULE | Freq: Every day | ORAL | Status: DC
  Administered 2015-12-10 – 2015-12-12 (×3): 120 mg via ORAL
  Filled 2015-12-09 (×3): qty 2

## 2015-12-09 MED ORDER — OXYCODONE-ACETAMINOPHEN 5-325 MG PO TABS
2.0000 | ORAL_TABLET | Freq: Once | ORAL | Status: AC
  Administered 2015-12-09: 2 via ORAL

## 2015-12-09 MED ORDER — SODIUM CHLORIDE 0.9 % IV SOLN
INTRAVENOUS | Status: AC
  Administered 2015-12-09 – 2015-12-10 (×2): via INTRAVENOUS

## 2015-12-09 MED ORDER — BISACODYL 5 MG PO TBEC: 5.0000 mg | DELAYED_RELEASE_TABLET | Freq: Every day | ORAL | Status: DC | PRN

## 2015-12-09 MED ORDER — ROFLUMILAST 500 MCG PO TABS
500.0000 ug | ORAL_TABLET | Freq: Every day | ORAL | Status: DC
  Administered 2015-12-10 – 2015-12-12 (×3): 500 ug via ORAL
  Filled 2015-12-09 (×4): qty 1

## 2015-12-09 MED ORDER — ENOXAPARIN SODIUM 40 MG/0.4ML ~~LOC~~ SOLN
40.0000 mg | SUBCUTANEOUS | Status: DC
  Administered 2015-12-10 – 2015-12-11 (×2): 40 mg via SUBCUTANEOUS
  Filled 2015-12-09 (×2): qty 0.4

## 2015-12-09 MED ORDER — MOMETASONE FURO-FORMOTEROL FUM 100-5 MCG/ACT IN AERO
2.0000 | INHALATION_SPRAY | Freq: Two times a day (BID) | RESPIRATORY_TRACT | Status: DC
  Administered 2015-12-09 – 2015-12-12 (×7): 2 via RESPIRATORY_TRACT
  Filled 2015-12-09: qty 8.8

## 2015-12-09 MED ORDER — LEVALBUTEROL HCL 1.25 MG/0.5ML IN NEBU
1.2500 mg | INHALATION_SOLUTION | Freq: Four times a day (QID) | RESPIRATORY_TRACT | Status: DC
  Administered 2015-12-10 – 2015-12-12 (×10): 1.25 mg via RESPIRATORY_TRACT
  Filled 2015-12-09 (×11): qty 0.5

## 2015-12-09 MED ORDER — HYDROCODONE-ACETAMINOPHEN 5-325 MG PO TABS
1.0000 | ORAL_TABLET | ORAL | Status: DC | PRN
  Administered 2015-12-09: 1 via ORAL
  Administered 2015-12-10 – 2015-12-12 (×7): 2 via ORAL
  Filled 2015-12-09 (×2): qty 2
  Filled 2015-12-09: qty 1
  Filled 2015-12-09 (×5): qty 2

## 2015-12-09 MED ORDER — IPRATROPIUM-ALBUTEROL 0.5-2.5 (3) MG/3ML IN SOLN
3.0000 mL | Freq: Once | RESPIRATORY_TRACT | Status: AC
  Administered 2015-12-09: 3 mL via RESPIRATORY_TRACT
  Filled 2015-12-09: qty 3

## 2015-12-09 MED ORDER — METFORMIN HCL ER 500 MG PO TB24
1000.0000 mg | ORAL_TABLET | Freq: Two times a day (BID) | ORAL | Status: DC
  Administered 2015-12-09 – 2015-12-10 (×2): 1000 mg via ORAL
  Filled 2015-12-09 (×2): qty 2

## 2015-12-09 MED ORDER — BUPROPION HCL ER (SR) 100 MG PO TB12
100.0000 mg | ORAL_TABLET | Freq: Every morning | ORAL | Status: DC
  Administered 2015-12-09 – 2015-12-12 (×4): 100 mg via ORAL
  Filled 2015-12-09 (×4): qty 1

## 2015-12-09 MED ORDER — CHLORHEXIDINE GLUCONATE CLOTH 2 % EX PADS: 6.0000 | MEDICATED_PAD | Freq: Every day | CUTANEOUS | Status: DC

## 2015-12-09 MED ORDER — POTASSIUM CHLORIDE 10 MEQ/100ML IV SOLN
10.0000 meq | INTRAVENOUS | Status: AC
  Administered 2015-12-09 (×3): 10 meq via INTRAVENOUS
  Filled 2015-12-09 (×3): qty 100

## 2015-12-09 MED ORDER — IPRATROPIUM-ALBUTEROL 18-103 MCG/ACT IN AERO: 2.0000 | INHALATION_SPRAY | RESPIRATORY_TRACT | Status: DC | PRN

## 2015-12-09 MED ORDER — OXYCODONE-ACETAMINOPHEN 5-325 MG PO TABS
ORAL_TABLET | ORAL | Status: AC
  Administered 2015-12-09: 2 via ORAL
  Filled 2015-12-09: qty 2

## 2015-12-09 MED ORDER — OXYCODONE HCL ER 10 MG PO T12A
20.0000 mg | EXTENDED_RELEASE_TABLET | Freq: Two times a day (BID) | ORAL | Status: DC
  Administered 2015-12-09 – 2015-12-12 (×6): 20 mg via ORAL
  Filled 2015-12-09 (×6): qty 2

## 2015-12-09 MED ORDER — POLYETHYLENE GLYCOL 3350 17 G PO PACK
17.0000 g | PACK | Freq: Every day | ORAL | Status: DC
  Administered 2015-12-12: 17 g via ORAL
  Filled 2015-12-09 (×2): qty 1

## 2015-12-09 NOTE — ED Provider Notes (Addendum)
[facility] Emergency Department Provider Note        Time seen: ----------------------------------------- 8:50 AM on 12/09/2015 -----------------------------------------    I have reviewed the triage vital signs and the nursing notes.   HISTORY  Chief Complaint No chief complaint on file.    HPI Craig Wright is a 50 y.o. male who presents to the ER for near syncope and shortness of breath. Patient reportedly wears 3 L of oxygen all the time, but has increased recently to 5 L because he feels better. This morning he felt his heart was racing, felt like he was going to pass out. He states his heart has raced before. He denies any other recent illness or complaint other than diarrhea this morning. He denies recent changes in his medicines. He is not sure who his cardiologist is.   Past Medical History:  Diagnosis Date  . Anxiety disorder   . Asthma   . COPD (chronic obstructive pulmonary disease) ([place])   . Diabetes mellitus without complication ([place])   . Hypertension     Patient Active Problem List   Diagnosis Date Noted  . COPD exacerbation ([place]) 10/24/2015  . Chronic respiratory failure with hypoxia ([place])   . Palliative care by specialist   . DNR (do not resuscitate)   . Weakness generalized   . Acute on chronic respiratory failure with hypoxia ([place]) 10/15/2015  . Dyspnea 06/07/2015    Past Surgical History:  Procedure Laterality Date  . KNEE SURGERY Left   . ORTHOPEDIC SURGERY     multiple  . SKIN GRAFT    . TOTAL HIP ARTHROPLASTY Left     Allergies Review of patient's allergies indicates no known allergies.  Social History Social History  Substance Use Topics  . Smoking status: Current Some Day Smoker  . Smokeless tobacco: Never Used  . Alcohol use 0.0 oz/week    Review of Systems Constitutional: Negative for fever. Cardiovascular: Negative for chest pain.Positive for tachycardia Respiratory: Positive for shortness of  breath Gastrointestinal: Negative for abdominal pain, vomiting and diarrhea. Genitourinary: Negative for dysuria. Musculoskeletal: Negative for back pain. Skin: Negative for rash. Neurological: Negative for headaches, positive for weakness  10-point ROS otherwise negative.  ____________________________________________   PHYSICAL EXAM:  VITAL SIGNS: ED Triage Vitals  Enc Vitals Group     BP      Pulse      Resp      Temp      Temp src      SpO2      Weight      Height      Head Circumference      Peak Flow      Pain Score      Pain Loc      Pain Edu?      Excl. in [place]?    Constitutional: Alert and oriented. Chronically ill, in mild distress Eyes: Conjunctivae are normal. PERRL. Normal extraocular movements. ENT   Head: Normocephalic and atraumatic.   Nose: No congestion/rhinnorhea.   Mouth/Throat: Mucous membranes are moist.   Neck: No stridor. Cardiovascular: Rapid rate, regular rhythm. No murmurs, rubs, or gallops. Respiratory: Tachypnea with rhonchi and rales bilaterally Gastrointestinal: Soft and nontender. Normal bowel sounds Musculoskeletal: Nontender with normal range of motion in all extremities. Mild lower extremity edema Neurologic:  Normal speech and language. No gross focal neurologic deficits are appreciated.  Skin:  Skin is warm, dry and intact. No rash noted. Psychiatric: Mood and affect are normal. Speech  and behavior are normal.  ____________________________________________  EKG: Interpreted by me. Sinus tachycardia with a rate of 133 bpm, normal PR interval, normal QRS, normal QT, normal axis  ____________________________________________  ED COURSE:  Pertinent labs & imaging results that were available during my care of the patient were reviewed by me and considered in my medical decision making (see chart for details). Clinical Course  Patient presents to the ER with a combination of tachycardia, dyspnea and near syncope. We will  assess with labs and imaging. Uncertain etiology at this time. CRITICAL CARE Performed by: Earleen Newport   Total critical care time: 30 minutes  Critical care time was exclusive of separately billable procedures and treating other patients.  Critical care was necessary to treat or prevent imminent or life-threatening deterioration.  Critical care was time spent personally by me on the following activities: development of treatment plan with patient and/or surrogate as well as nursing, discussions with consultants, evaluation of patient's response to treatment, examination of patient, obtaining history from patient or surrogate, ordering and performing treatments and interventions, ordering and review of laboratory studies, ordering and review of radiographic studies, pulse oximetry and re-evaluation of patient's condition.   Procedures ____________________________________________   LABS (pertinent positives/negatives)  Labs Reviewed  CBC WITH DIFFERENTIAL/PLATELET - Abnormal; Notable for the following:       Result Value   RBC 4.22 (*)    Hemoglobin 12.7 (*)    HCT 36.7 (*)    Lymphs Abs 0.7 (*)    All other components within normal limits  BASIC METABOLIC PANEL - Abnormal; Notable for the following:    Potassium 3.1 (*)    Chloride 96 (*)    CO2 33 (*)    Glucose, Bld 154 (*)    BUN <5 (*)    Creatinine, Ser 0.56 (*)    Calcium 8.4 (*)    All other components within normal limits  TROPONIN I - Abnormal; Notable for the following:    Troponin I 0.05 (*)    All other components within normal limits  BLOOD GAS, VENOUS - Abnormal; Notable for the following:    pCO2, Ven 62 (*)    pO2, Ven 61.0 (*)    Bicarbonate 38.4 (*)    Acid-Base Excess 11.1 (*)    All other components within normal limits  BRAIN NATRIURETIC PEPTIDE    RADIOLOGY  Chest x-ray IMPRESSION: Mild cardiomegaly. No focal pulmonary infiltrate. No pleural effusion or  pneumothorax.  ____________________________________________  FINAL ASSESSMENT AND PLAN  COPD, tachycardia  Plan: Patient with labs and imaging as dictated above. Patient is requesting at least one night in the hospital because when he walks around he feels so short of breath. His lab work is grossly within normal limits from his prior. 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 I will discuss with the hospitalist for observation.   Earleen Newport, MD   Note: This dictation was prepared with Dragon dictation. Any transcriptional errors that result from this process are unintentional    Earleen Newport, MD 12/09/15 [place], MD 12/19/15 0800

## 2015-12-09 NOTE — ED Triage Notes (Signed)
Arrives to ED from [facility].  States awoke this morning at around 0330 with difficulty breathing and chest tightness.  Also c/o feeling heart race.  EMS report similar symptoms about one month ago, which patient was hospitalized for.  Patient wears home oxygen at 3L/[place], states symptoms feel better with oxygen at 5l/[place]

## 2015-12-09 NOTE — H&P (Addendum)
[facility] at [place] NAME: Craig Wright    MR#:  [id number]  DATE OF BIRTH:  [birth year]  DATE OF ADMISSION:  12/09/2015  PRIMARY CARE PHYSICIAN: No PCP Per Patient   REQUESTING/REFERRING PHYSICIAN: Dr. Lenise Arena  CHIEF COMPLAINT: Shortness of breath    Chief Complaint  Patient presents with  . Chest Pain    HISTORY OF PRESENT ILLNESS:  Craig Wright  is a 50 y.o. 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 Patient came because of respiratory distress. Found to have tachycardia heart rate up to 1 20 bpm. O2 sats are at 98% on 4 L. Admitting the patient for COPD exacerbation. Patient says he was fine yesterday afternoon. Shortness of breath since last night. Has some cough. No fever. Had diarrhea yesterday but the results today. No abdominal pain, no nausea, no vomiting.  PAST MEDICAL HISTORY:   Past Medical History:  Diagnosis Date  . Anxiety disorder   . Asthma   . COPD (chronic obstructive pulmonary disease) ([place])   . Diabetes mellitus without complication ([place])   . Hypertension     PAST SURGICAL HISTOIRY:   Past Surgical History:  Procedure Laterality Date  . KNEE SURGERY Left   . ORTHOPEDIC SURGERY     multiple  . SKIN GRAFT    . TOTAL HIP ARTHROPLASTY Left     SOCIAL HISTORY:   Social History  Substance Use Topics  . Smoking status: Current Some Day Smoker  . Smokeless tobacco: Never Used  . Alcohol use 0.0 oz/week    FAMILY HISTORY:   Family History  Problem Relation Age of Onset  . Cervical cancer Mother   . Colon cancer Father     DRUG ALLERGIES:  No Known Allergies  REVIEW OF SYSTEMS:  CONSTITUTIONAL: No fever, fatigue or weakness.  EYES: No blurred or double vision.  EARS, NOSE, AND THROAT: No tinnitus or ear pain.  RESPIRATORY: Cough,  shortness of breath, chest tightness since last night. Patient felt dizzy this morning.  CARDIOVASCULAR: No chest pain, orthopnea, edema.  GASTROINTESTINAL: No nausea, vomiting, diarrhea or abdominal pain.  GENITOURINARY: No dysuria, hematuria.  ENDOCRINE: No polyuria, nocturia,  HEMATOLOGY: No anemia, easy bruising or bleeding SKIN: No rash or lesion. MUSCULOSKELETAL: No joint pain or arthritis.   NEUROLOGIC: No tingling, numbness, weakness.  PSYCHIATRY: Has history of anxiety, depression.  MEDICATIONS AT HOME:   Prior to Admission medications   Medication Sig Start Date End Date Taking? Authorizing Provider  acetaminophen (TYLENOL) 325 MG tablet Take 650 mg by mouth every 4 (four) hours as needed.   Yes Historical Provider, MD  albuterol (PROVENTIL) (2.5 MG/3ML) 0.083% nebulizer solution Take 2.5 mg by nebulization every 2 (two) hours as needed for wheezing or shortness of breath.   Yes Historical Provider, MD  albuterol-ipratropium (COMBIVENT) 18-103 MCG/ACT inhaler Inhale 2 puffs into the lungs every 4 (four) hours as needed for wheezing or shortness of breath.   Yes Historical Provider, MD  buPROPion (WELLBUTRIN SR) 100 MG 12 hr tablet Take 100 mg by mouth every morning.   Yes Historical Provider, MD  diltiazem (CARDIZEM) 120 MG tablet Take 1 tablet (120 mg total) by mouth 4 (four) times daily. 10/23/15  Yes Dustin Flock, MD  DULoxetine (CYMBALTA) 60 MG capsule Take 120 mg by mouth daily.    Yes  Historical Provider, MD  furosemide (LASIX) 40 MG tablet Take 40 mg by mouth daily.    Yes Historical Provider, MD  metFORMIN (GLUMETZA) 500 MG (MOD) 24 hr tablet Take 2 tablets (1,000 mg total) by mouth 2 (two) times daily. Reported on 06/26/2015 10/27/15  Yes Dustin Flock, MD  mirtazapine (REMERON) 45 MG tablet Take 45 mg by mouth at bedtime.   Yes Historical Provider, MD  mometasone-formoterol (DULERA) 100-5 MCG/ACT AERO Inhale 2 puffs into the lungs 2 (two) times daily. 06/28/15  Yes Fritzi Mandes,  MD  oxyCODONE (OXYCONTIN) 20 mg 12 hr tablet Take 20 mg by mouth every 12 (twelve) hours.   Yes Historical Provider, MD  Oxycodone HCl 10 MG TABS Take 10 mg by mouth every 6 (six) hours as needed.   Yes Historical Provider, MD  polyethylene glycol (MIRALAX / GLYCOLAX) packet Take 17 g by mouth daily.   Yes Historical Provider, MD  risperiDONE (RISPERDAL) 2 MG tablet Take 2 mg by mouth at bedtime.   Yes Historical Provider, MD  roflumilast (DALIRESP) 500 MCG TABS tablet Take 500 mcg by mouth daily.   Yes Historical Provider, MD  tiotropium (SPIRIVA) 18 MCG inhalation capsule Place 18 mcg into inhaler and inhale daily.   Yes Historical Provider, MD  senna (SENNA-LAX) 8.6 MG tablet Take 2 tablets by mouth daily as needed for constipation.    Historical Provider, MD      VITAL SIGNS:  Blood pressure 130/84, pulse (!) 128, temperature 97.8 F (36.6 C), temperature source Oral, resp. rate (!) 23, height 6' (1.829 m), weight 83 kg (183 lb), SpO2 96 %.  PHYSICAL EXAMINATION:  GENERAL:  50 y.o.-year-old patient lying in the bed with no acute distress.  EYES: Pupils equal, round, reactive to light and accommodation. No scleral icterus. Extraocular muscles intact.  HEENT: Head atraumatic, normocephalic. Oropharynx and nasopharynx clear.  NECK:  Supple, no jugular venous distention. No thyroid enlargement, no tenderness.  LUNGS:Decreased breath sounds bilaterally, very faint expiratory wheeze in both lungs. no rales,rhonchi or crepitation. No use of accessory muscles of respiration.  CARDIOVASCULAR: S1, S2 normal .Tachycardic.Marland Kitchen No murmurs, rubs, or gallops.  ABDOMEN: Soft, nontender, nondistended. Bowel sounds present. No organomegaly or mass.  EXTREMITIES: No pedal edema, cyanosis, or clubbing.  NEUROLOGIC: Cranial nerves II through XII are intact. Muscle strength 5/5 in all extremities. Sensation intact. Gait not checked.  PSYCHIATRIC: The patient is alert and oriented x 3.  SKIN: No obvious rash,  lesion, or ulcer.   LABORATORY PANEL:   CBC  Recent Labs Lab 12/09/15 0858  WBC 5.8  HGB 12.7*  HCT 36.7*  PLT 150   ------------------------------------------------------------------------------------------------------------------  Chemistries   Recent Labs Lab 12/09/15 0858  NA 135  K 3.1*  CL 96*  CO2 33*  GLUCOSE 154*  BUN <5*  CREATININE 0.56*  CALCIUM 8.4*   ------------------------------------------------------------------------------------------------------------------  Cardiac Enzymes  Recent Labs Lab 12/09/15 0858  TROPONINI 0.05*   ------------------------------------------------------------------------------------------------------------------  RADIOLOGY:  Dg Chest Port 1 View  Result Date: 12/09/2015 CLINICAL DATA:  Difficulty breathing.  Chest tightness. EXAM: PORTABLE CHEST 1 VIEW COMPARISON:  10/24/2015. FINDINGS: Surgical staples right chest. Mediastinum and hilar structures normal. Cardiomegaly with normal pulmonary vascularity. No focal infiltrate. No pleural effusion or pneumothorax . IMPRESSION: Mild cardiomegaly. No focal pulmonary infiltrate. No pleural effusion or pneumothorax. Electronically Signed   By: Marcello Moores  Register   On: 12/09/2015 09:14    EKG:   Orders placed or performed during the hospital encounter of 12/09/15  .  ED EKG  . ED EKG  . EKG 12-Lead  . EKG 12-Lead  Sinus tachycardia 1 30 bpm no ST-T changes.  IMPRESSION AND PLAN:  1.COPD exacerbation: Admitted to hospitalist service, started on now IV steroids, continue Xopenex nebulizer every 6 hours, because of tachycardia avoid albuterol.Craig Wright is clear, no indication for antibiotics at this time. Continue oxygen 4-5 L to keep sats above 90%. #2 sinus tachycardia secondary to respiratory distress: Patient already is on Cardizem CD 120 mg daily, increased to 180 mg daily, monitored on telemetry. Start gentle hydration as he clinically looks dehydrated. #3 hypokalemia  secondary to diuretics, diarrhea induced:replace  the potassium, hold her diuretics. 4.For history of anxiety, depression: Continue home medications 5.GI, DVT prophylaxis. 6. Chronic hypercapnia with CO2 around 61: He is alert, awake. Monitor closely. #7 slightly elevated troponins likely respiratory distress. Cycle the troponins 2 more sets. Monitor on telemetry. #8 diabetes mellitus type 2: Continue metformin, SSI with coverage.  All the records are reviewed and case discussed with ED provider. Management plans discussed with the patient, family and they are in agreement.  CODE STATUS: full  TOTAL TIME TAKING CARE OF THIS PATIENT: 77mnutes.    Craig LeschesM.D on 12/09/2015 at 12:10 PM  Between 7am to 6pm - Pager - [phone]  After 6pm go to [email or web address] - password [email or web address] [facility]  Office  [phone] CC: Primary care physician; No PCP Per Patient  Note: This dictation was prepared with Dragon dictation along with smaller phrase technology. Any transcriptional errors that result from this process are unintentional.

## 2015-12-09 NOTE — ED Notes (Signed)
AAOx3.  Skin warm and dry.  NAD 

## 2015-12-10 LAB — CBC
HCT: 36.2 % — ABNORMAL LOW (ref 40.0–52.0)
Hemoglobin: 12.1 g/dL — ABNORMAL LOW (ref 13.0–18.0)
MCH: 29.2 pg (ref 26.0–34.0)
MCHC: 33.4 g/dL (ref 32.0–36.0)
MCV: 87.5 fL (ref 80.0–100.0)
PLATELETS: 138 10*3/uL — AB (ref 150–440)
RBC: 4.14 MIL/uL — ABNORMAL LOW (ref 4.40–5.90)
RDW: 14.3 % (ref 11.5–14.5)
WBC: 5.2 10*3/uL (ref 3.8–10.6)

## 2015-12-10 LAB — GLUCOSE, CAPILLARY
GLUCOSE-CAPILLARY: 173 mg/dL — AB (ref 65–99)
Glucose-Capillary: 110 mg/dL — ABNORMAL HIGH (ref 65–99)
Glucose-Capillary: 184 mg/dL — ABNORMAL HIGH (ref 65–99)
Glucose-Capillary: 252 mg/dL — ABNORMAL HIGH (ref 65–99)

## 2015-12-10 LAB — TROPONIN I
TROPONIN I: 0.03 ng/mL — AB (ref <0.03)
Troponin I: 0.03 ng/mL (ref <0.03)

## 2015-12-10 LAB — BASIC METABOLIC PANEL
Anion gap: 6 (ref 5–15)
BUN: 9 mg/dL (ref 6–20)
CALCIUM: 8.4 mg/dL — AB (ref 8.9–10.3)
CO2: 32 mmol/L (ref 22–32)
CREATININE: 0.54 mg/dL — AB (ref 0.61–1.24)
Chloride: 101 mmol/L (ref 101–111)
GFR calc Af Amer: >60 mL/min (ref >60)
Glucose, Bld: 151 mg/dL — ABNORMAL HIGH (ref 65–99)
Potassium: 3.8 mmol/L (ref 3.5–5.1)
SODIUM: 139 mmol/L (ref 135–145)

## 2015-12-10 MED ORDER — INSULIN GLARGINE 100 UNIT/ML ~~LOC~~ SOLN
10.0000 [IU] | Freq: Every day | SUBCUTANEOUS | Status: DC
  Administered 2015-12-10 – 2015-12-11 (×2): 10 [IU] via SUBCUTANEOUS
  Filled 2015-12-10 (×3): qty 0.1

## 2015-12-10 MED ORDER — INSULIN ASPART 100 UNIT/ML ~~LOC~~ SOLN
4.0000 [IU] | Freq: Three times a day (TID) | SUBCUTANEOUS | Status: DC
  Administered 2015-12-10 – 2015-12-12 (×6): 4 [IU] via SUBCUTANEOUS
  Filled 2015-12-10 (×6): qty 4

## 2015-12-10 MED ORDER — NICOTINE 21 MG/24HR TD PT24
21.0000 mg | MEDICATED_PATCH | Freq: Every day | TRANSDERMAL | Status: DC
  Administered 2015-12-10 – 2015-12-12 (×3): 21 mg via TRANSDERMAL
  Filled 2015-12-10 (×3): qty 1

## 2015-12-10 MED ORDER — METHYLPREDNISOLONE SODIUM SUCC 125 MG IJ SOLR
60.0000 mg | Freq: Two times a day (BID) | INTRAMUSCULAR | Status: DC
  Administered 2015-12-10 – 2015-12-11 (×2): 60 mg via INTRAVENOUS
  Filled 2015-12-10 (×2): qty 2

## 2015-12-10 NOTE — Care Management Note (Signed)
Case Management Note  Patient Details  Name: Craig Wright MRN: [id number] Date of Birth: [birth year]  Subjective/Objective:   RNCM assessment for discharge planning. Patient from [facility] He is active with [facility] for SN and SW. they have been notified of admission. Met with patient at bedside. He remains on O2 @ 5L.  He uses no DME. Discussed hospice services with patient and he refused. " I have already been there with them and Im not doing that again". He would like to continue with [facility] but states they do not come out often. Requested patient be made [place] for increased SN visits in hope of decreasing hospitalizations. Corene Cornea with [facility] notified. PCP is Damita Lack            Action/Plan: HRI with [facility].   Expected Discharge Date:                  Expected Discharge Plan:  [place]  In-House Referral:     Discharge planning Services  CM Consult  Post Acute Care Choice:  Home Health, Resumption of Svcs/PTA Provider Choice offered to:     DME Arranged:    DME Agency:     HH Arranged:  RN, Social Work [employer] Agency:  [place]  Status of Service:  In process, will continue to follow  If discussed at Long Length of Stay Meetings, dates discussed:    Additional Comments:  Jolly Mango, RN 12/10/2015, 9:16 AM

## 2015-12-10 NOTE — Progress Notes (Addendum)
Inpatient Diabetes Program Recommendations  AACE/ADA: New Consensus Statement on Inpatient Glycemic Control (2015)  Target Ranges:  Prepandial:   less than 140 mg/dL      Peak postprandial:   less than 180 mg/dL (1-2 hours)      Critically ill patients:  140 - 180 mg/dL   Lab Results  Component Value Date   GLUCAP 110 (H) 12/10/2015   HGBA1C 8.0 (H) 10/15/2015    Review of Glycemic Control  Results for RAJON, BISIG (MRN [id number]) as of 12/10/2015 10:08  Ref. Range 12/09/2015 15:24 12/09/2015 16:26 12/09/2015 21:27 12/10/2015 07:32  Glucose-Capillary Latest Ref Range: 65 - 99 mg/dL 314 (H) 280 (H) 230 (H) 110 (H)    Diabetes history: Type 2 Outpatient Diabetes medications: Metformin '1000mg'$  bid Current orders for Inpatient glycemic control: Metformin '1000mg'$  bid, Novolog 0-15 units tid  * steroids '60mg'$  q24hours  Inpatient Diabetes Program Recommendations:  If steroids are continued, please consider ordering Lantus 10 units Q24H starting now. If steroids are continued, please consider ordering Novolog 4 units TID with meals for meal coverage if patient is eating at least 50% of meals.  Please change diet to carb modified/heart healthy   Gentry Fitz, RN, [place], [place], CDE Diabetes Coordinator Inpatient Diabetes Program  [phone] (Team Pager) [phone] ([place]) 12/10/2015 10:18 AM

## 2015-12-10 NOTE — Clinical Social Work Note (Signed)
MSW aknowledged receipt of consult that patient is from group home [facility].  MSW to meet with patient and complete assessment at a later time.  Jones Broom. Kati Riggenbach, MSW [phone]  Mon-Fri 8a-4:30p 12/10/2015 5:17 PM

## 2015-12-10 NOTE — Progress Notes (Signed)
[facility] at [place] NAME: Craig Wright    MR#:  [id number]  DATE OF BIRTH:  01-03-1966  SUBJECTIVE:  CHIEF COMPLAINT:  Patient's shortness of breath is better than yesterday but still reporting cough and shortness of breath with exertion. Still continues to smoke one pack in 36 hours  REVIEW OF SYSTEMS:  CONSTITUTIONAL: No fever, fatigue or weakness.  EYES: No blurred or double vision.  EARS, NOSE, AND THROAT: No tinnitus or ear pain.  RESPIRATORY: Reports cough, shortness of breath, wheezing with exertion, denies  hemoptysis.  CARDIOVASCULAR: No chest pain, orthopnea, edema.  GASTROINTESTINAL: No nausea, vomiting, diarrhea or abdominal pain.  GENITOURINARY: No dysuria, hematuria.  ENDOCRINE: No polyuria, nocturia,  HEMATOLOGY: No anemia, easy bruising or bleeding SKIN: No rash or lesion. MUSCULOSKELETAL: No joint pain or arthritis.   NEUROLOGIC: No tingling, numbness, weakness.  PSYCHIATRY: No anxiety or depression.   DRUG ALLERGIES:  No Known Allergies  VITALS:  Blood pressure (!) 180/78, pulse (!) 121, temperature 97.4 F (36.3 C), temperature source Oral, resp. rate (!) 22, height 6' (1.829 m), weight 86.6 kg (190 lb 14.4 oz), SpO2 95 %.  PHYSICAL EXAMINATION:  GENERAL:  50 y.o.-year-old patient lying in the bed with no acute distress.  EYES: Pupils equal, round, reactive to light and accommodation. No scleral icterus. Extraocular muscles intact.  HEENT: Head atraumatic, normocephalic. Oropharynx and nasopharynx clear.  NECK:  Supple, no jugular venous distention. No thyroid enlargement, no tenderness.  LUNGS: Diminished coarse bronchial breath sounds bilaterally, diffuse wheezing, no rales,rhonchi or crepitation. No use of accessory muscles of respiration.  CARDIOVASCULAR: S1, S2 normal. No murmurs, rubs, or gallops.  ABDOMEN: Soft, nontender, nondistended. Bowel sounds present. No organomegaly or mass.  EXTREMITIES:  No pedal edema, cyanosis, or clubbing.  NEUROLOGIC: Cranial nerves II through XII are intact. Muscle strength 5/5 in all extremities. Sensation intact. Gait not checked.  PSYCHIATRIC: The patient is alert and oriented x 3.  SKIN: No obvious rash, lesion, or ulcer.    LABORATORY PANEL:   CBC  Recent Labs Lab 12/10/15 0342  WBC 5.2  HGB 12.1*  HCT 36.2*  PLT 138*   ------------------------------------------------------------------------------------------------------------------  Chemistries   Recent Labs Lab 12/10/15 0342  NA 139  K 3.8  CL 101  CO2 32  GLUCOSE 151*  BUN 9  CREATININE 0.54*  CALCIUM 8.4*   ------------------------------------------------------------------------------------------------------------------  Cardiac Enzymes  Recent Labs Lab 12/09/15 0858  TROPONINI 0.05*   ------------------------------------------------------------------------------------------------------------------  RADIOLOGY:  Dg Chest Port 1 View  Result Date: 12/09/2015 CLINICAL DATA:  Difficulty breathing.  Chest tightness. EXAM: PORTABLE CHEST 1 VIEW COMPARISON:  10/24/2015. FINDINGS: Surgical staples right chest. Mediastinum and hilar structures normal. Cardiomegaly with normal pulmonary vascularity. No focal infiltrate. No pleural effusion or pneumothorax . IMPRESSION: Mild cardiomegaly. No focal pulmonary infiltrate. No pleural effusion or pneumothorax. Electronically Signed   By: Marcello Moores  Register   On: 12/09/2015 09:14    EKG:   Orders placed or performed during the hospital encounter of 12/09/15  . ED EKG  . ED EKG  . EKG 12-Lead  . EKG 12-Lead    ASSESSMENT AND PLAN:    1.COPD exacerbation: Continue IV steroids, continue Xopenex nebulizer every 6 hours, because of tachycardia avoid albuterol.[place] is clear, no indication for antibiotics at this time. Continue oxygen 4-5 L to keep sats above 90%. Patient chronically lives on 5 L of oxygen at home  #2 sinus  tachycardia secondary to respiratory distress:  Patient already is on Cardizem CD 120 mg daily, increased to 180 mg daily, monitor on telemetry.  gentle hydration as he clinically looks dehydrated.  #3 hypokalemia secondary to diuretics, diarrhea induced:replaced  the potassium, hold diuretics. Patient's potassium today is at 3.8  4. history of anxiety, depression: Continue home medications  5.GI, DVT prophylaxis.  6. Chronic hypercapnia with CO2 around 61: He is alert, awake. Monitor closely.  #7 slightly elevated troponins likely respiratory distress. Cycle the troponins  Monitor on telemetry.  #8 diabetes mellitus type 2: Started patient on Lantus for basal coverage and hold his home medication metformin, SSI with coverage.    All the records are reviewed and case discussed with Care Management/Social Workerr. Management plans discussed with the patient, family and they are in agreement.  CODE STATUS: fc  TOTAL TIME TAKING CARE OF THIS PATIENT: 36  minutes.   POSSIBLE D/C IN 2-3 DAYS, DEPENDING ON CLINICAL CONDITION.  Note: This dictation was prepared with Dragon dictation along with smaller phrase technology. Any transcriptional errors that result from this process are unintentional.   Nicholes Mango M.D on 12/10/2015 at 2:59 PM  Between 7am to 6pm - Pager - [phone] After 6pm go to [email or web address] - password [email or web address] [facility]  [place] [facility]  Office  [phone]  CC: Primary care physician; No PCP Per Patient

## 2015-12-11 LAB — GLUCOSE, CAPILLARY
GLUCOSE-CAPILLARY: 224 mg/dL — AB (ref 65–99)
Glucose-Capillary: 181 mg/dL — ABNORMAL HIGH (ref 65–99)
Glucose-Capillary: 229 mg/dL — ABNORMAL HIGH (ref 65–99)
Glucose-Capillary: 196 mg/dL — ABNORMAL HIGH (ref 65–99)

## 2015-12-11 MED ORDER — METHYLPREDNISOLONE SODIUM SUCC 40 MG IJ SOLR
40.0000 mg | Freq: Two times a day (BID) | INTRAMUSCULAR | Status: DC
  Administered 2015-12-11 – 2015-12-12 (×2): 40 mg via INTRAVENOUS
  Filled 2015-12-11 (×2): qty 1

## 2015-12-11 MED ORDER — DILTIAZEM HCL ER COATED BEADS 240 MG PO CP24
240.0000 mg | ORAL_CAPSULE | Freq: Every day | ORAL | Status: DC
  Administered 2015-12-12: 240 mg via ORAL
  Filled 2015-12-11: qty 1

## 2015-12-11 NOTE — Progress Notes (Signed)
[place] at [place] NAME: Craig Wright    MR#:  [id number]  DATE OF BIRTH:  08-19-1965  SUBJECTIVE:  CHIEF COMPLAINT:  Patient's shortness of breath is better and Cough is minimal  Still continues to smoke one pack in 36 hours  REVIEW OF SYSTEMS:  CONSTITUTIONAL: No fever, fatigue or weakness.  EYES: No blurred or double vision.  EARS, NOSE, AND THROAT: No tinnitus or ear pain.  RESPIRATORY: Reports cough, shortness of breath, wheezing with exertion, denies  hemoptysis.  CARDIOVASCULAR: No chest pain, orthopnea, edema.  GASTROINTESTINAL: No nausea, vomiting, diarrhea or abdominal pain.  GENITOURINARY: No dysuria, hematuria.  ENDOCRINE: No polyuria, nocturia,  HEMATOLOGY: No anemia, easy bruising or bleeding SKIN: No rash or lesion. MUSCULOSKELETAL: No joint pain or arthritis.   NEUROLOGIC: No tingling, numbness, weakness.  PSYCHIATRY: No anxiety or depression.   DRUG ALLERGIES:  No Known Allergies  VITALS:  Blood pressure (!) 152/91, pulse (!) 115, temperature 97.9 F (36.6 C), temperature source Oral, resp. rate 18, height 6' (1.829 m), weight 88.2 kg (194 lb 6.4 oz), SpO2 93 %.  PHYSICAL EXAMINATION:  GENERAL:  50 y.o.-year-old patient lying in the bed with no acute distress.  EYES: Pupils equal, round, reactive to light and accommodation. No scleral icterus. Extraocular muscles intact.  HEENT: Head atraumatic, normocephalic. Oropharynx and nasopharynx clear.  NECK:  Supple, no jugular venous distention. No thyroid enlargement, no tenderness.  LUNGS: Diminished coarse bronchial breath sounds bilaterally, diffuse wheezing, no rales,rhonchi or crepitation. No use of accessory muscles of respiration.  CARDIOVASCULAR: S1, S2 normal. No murmurs, rubs, or gallops.  ABDOMEN: Soft, nontender, nondistended. Bowel sounds present. No organomegaly or mass.  EXTREMITIES: No pedal edema, cyanosis, or clubbing.  NEUROLOGIC: Cranial  nerves II through XII are intact. Muscle strength 5/5 in all extremities. Sensation intact. Gait not checked.  PSYCHIATRIC: The patient is alert and oriented x 3.  SKIN: No obvious rash, lesion, or ulcer.    LABORATORY PANEL:   CBC  Recent Labs Lab 12/10/15 0342  WBC 5.2  HGB 12.1*  HCT 36.2*  PLT 138*   ------------------------------------------------------------------------------------------------------------------  Chemistries   Recent Labs Lab 12/10/15 0342  NA 139  K 3.8  CL 101  CO2 32  GLUCOSE 151*  BUN 9  CREATININE 0.54*  CALCIUM 8.4*   ------------------------------------------------------------------------------------------------------------------  Cardiac Enzymes  Recent Labs Lab 12/10/15 2138  TROPONINI 0.03*   ------------------------------------------------------------------------------------------------------------------  RADIOLOGY:  No results found.  EKG:   Orders placed or performed during the hospital encounter of 12/09/15  . ED EKG  . ED EKG  . EKG 12-Lead  . EKG 12-Lead    ASSESSMENT AND PLAN:    1.COPD exacerbation: Continue IV steroidsTapered down to 40 mg IV every 12, continue Xopenex nebulizer every 6 hours, because of tachycardia avoid albuterol.[place] is clear, no indication for antibiotics at this time. Continue oxygen 4-5 L to keep sats above 90%. Patient chronically lives on 5 L of oxygen at home Check laboratory pulse ox  iS   #2 sinus tachycardia secondary to respiratory distress: Patient already is on Cardizem CD 120 mg daily, increased to 240 mg daily, monitor on telemetry. Status post IV fluids  #3 hypokalemia secondary to diuretics, diarrhea induced:replaced  the potassium, hold diuretics. Patient's potassium  is at 3.8  4. history of anxiety, depression: Continue home medications  5.GI, DVT prophylaxis.  6. Chronic hypercapnia with CO2 around 61  Chronic hypoxic respiratory failureHe is alert,  awake.  Monitor closely. lives on 5 L of home oxygen  #7 slightly elevated troponins likely respiratory distress. Cycle the troponins  Monitor on telemetry.  #8 diabetes mellitus type 2: Started patient on Lantus for basal coverage and hold his home medication metformin, SSI with coverage.  #Tobacco abuse disorder consult patient to quit smoking for 3-5 minutes Start him on nicotine patch will provide nicotine patch prescription at the time of discharge  All the records are reviewed and case discussed with Care Management/Social Workerr. Management plans discussed with the patient, family and they are in agreement.  CODE STATUS: fc  TOTAL TIME TAKING CARE OF THIS PATIENT: 36  minutes.   POSSIBLE D/C IN 2-3 DAYS, DEPENDING ON CLINICAL CONDITION.  Note: This dictation was prepared with Dragon dictation along with smaller phrase technology. Any transcriptional errors that result from this process are unintentional.   Nicholes Mango M.D on 12/11/2015 at 3:20 PM  Between 7am to 6pm - Pager - [phone] After 6pm go to [email or web address] - password [email or web address] [facility]  [place] [facility]  Office  [phone]  CC: Primary care physician; No PCP Per Patient

## 2015-12-11 NOTE — Clinical Social Work Note (Signed)
MSW attempted to see patient to complete assessment, but he was sleeping and did not want to be disturbed.  MSW will attempt to see patient at a later time.  Jones Broom. Braydn Carneiro, MSW [phone]  Mon-Fri 8a-4:30p 12/11/2015 9:57 AM

## 2015-12-11 NOTE — Clinical Social Work Note (Signed)
MSW met with patient who confirmed he is from [facility] group home and patient stated he has been living there for approximately one year.  Patient stated he is in agreement to returning back to group home once he is medically ready for discharge and orders have been received.  MSW attempted to contact group home supervisor Lavada Mesi at [phone] and left a message awaiting for call back from her.  MSW to complete formal assessment and FL2 at a later time.  MSW to continue to follow patient's progress throughout discharge planning.  Jones Broom. Colter Magowan, MSW [phone]  Mon-Fri 8a-4:30p 12/11/2015 5:15 PM

## 2015-12-12 LAB — GLUCOSE, CAPILLARY
Glucose-Capillary: 162 mg/dL — ABNORMAL HIGH (ref 65–99)
Glucose-Capillary: 173 mg/dL — ABNORMAL HIGH (ref 65–99)

## 2015-12-12 MED ORDER — DILTIAZEM HCL ER COATED BEADS 240 MG PO CP24: 240.0000 mg | ORAL_CAPSULE | Freq: Every day | ORAL | 0 refills | Status: AC

## 2015-12-12 MED ORDER — PREDNISONE 10 MG (21) PO TBPK: 10.0000 mg | ORAL_TABLET | Freq: Every day | ORAL | 0 refills | Status: DC

## 2015-12-12 MED ORDER — NICOTINE 21 MG/24HR TD PT24: 21.0000 mg | MEDICATED_PATCH | Freq: Every day | TRANSDERMAL | 0 refills | Status: AC

## 2015-12-12 MED ORDER — IPRATROPIUM-ALBUTEROL 18-103 MCG/ACT IN AERO: 2.0000 | INHALATION_SPRAY | RESPIRATORY_TRACT | 0 refills | Status: DC | PRN

## 2015-12-12 NOTE — Clinical Social Work Note (Signed)
MSW spoke to [place] patient can return once FL2 has been signed and discharge summary has been completed.  MSW to facilitate discharge planning, patient states his mother will pick him up and bring him back to group home.  Jones Broom. Joellen Tullos, MSW [phone]  Mon-Fri 8a-4:30p 12/12/2015 11:46 AM

## 2015-12-12 NOTE — Care Management Note (Signed)
Case Management Note  Patient Details  Name: Craig Wright MRN: [id number] Date of Birth: February 12, 1966  Subjective/Objective:  Discharge today.                   Action/Plan: Resumption of care with [facility]. Requested [place]  Expected Discharge Date:     12/12/2015             Expected Discharge Plan:  [place]  In-House Referral:     Discharge planning Services  CM Consult  Post Acute Care Choice:  Home Health, Resumption of Svcs/PTA Provider Choice offered to:  Patient  DME Arranged:    DME Agency:     HH Arranged:  RN, Social Work [employer] Agency:  [place]  Status of Service:  Completed, signed off  If discussed at [employer], dates discussed:    Additional Comments:  Jolly Mango, RN 12/12/2015, 10:01 AM

## 2015-12-12 NOTE — NC FL2 (Signed)
[place] LEVEL OF CARE SCREENING TOOL     IDENTIFICATION  Patient Name: Craig Wright Birthdate: [birth year] Sex: male Admission Date (Current Location): 12/09/2015  [facility] and [place] Number:  Engineering geologist and Address:  [facility] [place], [place] [address] [place], [place] [address]      Provider Number: [id number]  Attending Physician Name and Address:  Nicholes Mango, MD  Relative Name and Phone Number:     Sydnee Levans Mother [phone]  [phone]       Current Level of Care: Hospital Recommended Level of Care: [place] Prior Approval Number:    Date Approved/Denied:   PASRR Number:    Discharge Plan: Domiciliary (Rest home)    Current Diagnoses: Patient Active Problem List   Diagnosis Date Noted  . COPD exacerbation ([place]) 10/24/2015  . Chronic respiratory failure with hypoxia ([place])   . Palliative care by specialist   . DNR (do not resuscitate)   . Weakness generalized   . Acute on chronic respiratory failure with hypoxia ([place]) 10/15/2015  . Dyspnea 06/07/2015    Orientation RESPIRATION BLADDER Height & Weight     Self, Time, Situation, Place  O2 Continent Weight: 193 lb 9 oz (87.8 kg) Height:  6' (182.9 cm)  BEHAVIORAL SYMPTOMS/MOOD NEUROLOGICAL BOWEL NUTRITION STATUS      Continent Diet  AMBULATORY STATUS COMMUNICATION OF NEEDS Skin   Limited Assist Verbally Normal                       Personal Care Assistance Level of Assistance  Bathing, Feeding, Dressing Bathing Assistance: Limited assistance Feeding assistance: Limited assistance Dressing Assistance: Limited assistance     Functional Limitations Info  Sight, Hearing, Speech Sight Info: Adequate Hearing Info: Adequate Speech Info: Adequate    SPECIAL [place] RN and Social Work                   Contractures Contractures Info: Not present    Additional Factors Info  Code Status, Allergies,  Psychotropic, Insulin Sliding Scale Code Status Info: Full Allergies Info: NKA Psychotropic Info: buPROPion (WELLBUTRIN SR) 12 hr tablet 100 mg, risperiDONE (RISPERDAL) tablet 2 mg Insulin Sliding Scale Info: insulin aspart (novoLOG) injection 0-15 Units 3x a day with meals, insulin glargine (LANTUS) injection 10 Units Dose: 10 Units Freq: Daily at bedtime Route: [place]       Current Medications (12/12/2015):  This is the current hospital active medication list Current Facility-Administered Medications  Medication Dose Route Frequency Provider Last Rate Last Dose  . acetaminophen (TYLENOL) tablet 650 mg  650 mg Oral Q6H PRN Epifanio Lesches, MD       Or  . acetaminophen (TYLENOL) suppository 650 mg  650 mg Rectal Q6H PRN Epifanio Lesches, MD      . albuterol (PROVENTIL) (2.5 MG/3ML) 0.083% nebulizer solution 2.5 mg  2.5 mg Nebulization Q2H PRN Epifanio Lesches, MD      . buPROPion [facility] SR) 12 hr tablet 100 mg  100 mg Oral q morning - 10a Epifanio Lesches, MD   100 mg at 12/12/15 0919  . diltiazem (CARDIZEM CD) 24 hr capsule 240 mg  240 mg Oral Daily Nicholes Mango, MD   240 mg at 12/12/15 0928  . docusate sodium (COLACE) capsule 100 mg  100 mg Oral BID Epifanio Lesches, MD   100 mg at 12/12/15 0919  . DULoxetine (CYMBALTA) DR capsule 120 mg  120 mg Oral Daily Epifanio Lesches, MD   120 mg at 12/12/15 0921  . enoxaparin (LOVENOX) injection 40 mg  40 mg Subcutaneous Q24H Epifanio Lesches, MD   40 mg at 12/11/15 1348  . HYDROcodone-acetaminophen (NORCO/VICODIN) 5-325 MG per tablet 1-2 tablet  1-2 tablet Oral Q4H PRN Epifanio Lesches, MD   2 tablet at 12/12/15 0450  . insulin aspart (novoLOG) injection 0-15 Units  0-15 Units Subcutaneous TID WC Epifanio Lesches, MD   3 Units at 12/12/15 0802  . insulin aspart (novoLOG) injection 4 Units  4 Units Subcutaneous TID WC Nicholes Mango, MD   4 Units at 12/12/15 0802  . insulin glargine (LANTUS) injection 10 Units  10 Units  Subcutaneous QHS Nicholes Mango, MD   10 Units at 12/11/15 2120  . ipratropium-albuterol (DUONEB) 0.5-2.5 (3) MG/3ML nebulizer solution 3 mL  3 mL Nebulization Q4H PRN Epifanio Lesches, MD      . levalbuterol Penne Lash) nebulizer solution 1.25 mg  1.25 mg Nebulization Q6H Epifanio Lesches, MD   1.25 mg at 12/12/15 0906  . methylPREDNISolone sodium succinate (SOLU-MEDROL) 40 mg/mL injection 40 mg  40 mg Intravenous Q12H Nicholes Mango, MD   40 mg at 12/12/15 0802  . mirtazapine (REMERON) tablet 45 mg  45 mg Oral QHS Epifanio Lesches, MD   45 mg at 12/11/15 2120  . mometasone-formoterol (DULERA) 100-5 MCG/ACT inhaler 2 puff  2 puff Inhalation BID Epifanio Lesches, MD   2 puff at 12/12/15 0919  . nicotine (NICODERM CQ - dosed in mg/24 hours) patch 21 mg  21 mg Transdermal Daily Nicholes Mango, MD   21 mg at 12/12/15 0923  . ondansetron (ZOFRAN) tablet 4 mg  4 mg Oral Q6H PRN Epifanio Lesches, MD       Or  . ondansetron (ZOFRAN) injection 4 mg  4 mg Intravenous Q6H PRN Epifanio Lesches, MD      . oxyCODONE (OXYCONTIN) 12 hr tablet 20 mg  20 mg Oral Q12H Epifanio Lesches, MD   20 mg at 12/12/15 0918  . polyethylene glycol (MIRALAX / GLYCOLAX) packet 17 g  17 g Oral Daily Epifanio Lesches, MD   17 g at 12/12/15 0920  . risperiDONE (RISPERDAL) tablet 2 mg  2 mg Oral QHS Epifanio Lesches, MD   2 mg at 12/11/15 2120  . roflumilast (DALIRESP) tablet 500 mcg  500 mcg Oral Daily Epifanio Lesches, MD   500 mcg at 12/12/15 0918  . senna (SENOKOT) tablet 17.2 mg  2 tablet Oral Daily PRN Epifanio Lesches, MD      . tiotropium (SPIRIVA) inhalation capsule 18 mcg  18 mcg Inhalation q morning - 10a Epifanio Lesches, MD   18 mcg at 12/12/15 0920     Discharge Medications: Please see discharge summary for a list of discharge medications.  Relevant Imaging Results:  Relevant Lab Results:   Additional Information SSN [id number]  Ross Ludwig

## 2015-12-12 NOTE — Discharge Summary (Addendum)
[facility] [place] NAME: Craig Wright    MR#:  [id number]  DATE OF BIRTH:  03/01/1965  DATE OF ADMISSION:  12/09/2015 ADMITTING PHYSICIAN: Epifanio Lesches, MD  DATE OF DISCHARGE: 12/12/2015  PRIMARY CARE PHYSICIAN: No PCP Per Patient    ADMISSION DIAGNOSIS:  Tachycardia [R00.0] Chronic obstructive pulmonary disease with acute exacerbation (HCC) [J44.1]  DISCHARGE DIAGNOSIS:  Active Problems:   COPD exacerbation (HCC) Chronic hypoxic respiratory failure lives on 5 L of oxygen  SECONDARY DIAGNOSIS:   Past Medical History:  Diagnosis Date  . Anxiety disorder   . Asthma   . COPD (chronic obstructive pulmonary disease) ([place])   . Diabetes mellitus without complication ([place])   . Hypertension     HOSPITAL COURSE:  Patient came in admitted with shortness of breath for COPD exacerbation please review history and physical for details  1.COPD exacerbation: Responded well to  IV steroids Provided Xopenex neb  treatments provided .[place] is clear, no indication for antibiotics at this time.  Continue oxygen 4-5 L to keep sats above 90%. Patient chronically lives on 5 L of oxygen at home Today ambulatory pulse ox is 92% on 5 L Discharge home with by mouth steroids and patient is to continue his inhalers  #2 sinus tachycardia secondary to respiratory distress: Patient already is on Cardizem CD 120 mg daily, increased to 240 mg daily, monitor on telemetry. Status post IV fluids  #3 hypokalemia secondary to diuretics, diarrhea induced:replaced the potassium, hold diuretics. Patient's potassium  is at 3.8  4. history of anxiety, depression: Continue home medications  5.GI, DVT prophylaxis.  6.Chronic hypercapnia with CO2 around 61  Chronic hypoxic respiratory failureHe is alert, awake. Monitor closely. lives on 5 L of home oxygen  #7 slightly elevated troponins likely respiratory distress. Cycle the troponins   Monitor on telemetry.  #8 diabetes mellitus type 2: Started patient on Lantus for basal coverage and hold his home medication metformin, SSIwith coverage.  #Tobacco abuse disorder consult patient to quit smoking for 3-5 minutes Start him on nicotine patch will provide nicotine patch prescription at the time of discharge  DISCHARGE CONDITIONS:  Fair  CONSULTS OBTAINED:     PROCEDURES None  DRUG ALLERGIES:  No Known Allergies  DISCHARGE MEDICATIONS:   Current Discharge Medication List    START taking these medications   Details  diltiazem (CARDIZEM CD) 240 MG 24 hr capsule Take 1 capsule (240 mg total) by mouth daily. Qty: 30 capsule, Refills: 0    nicotine (NICODERM CQ - DOSED IN MG/24 HOURS) 21 mg/24hr patch Place 1 patch (21 mg total) onto the skin daily. Qty: 28 patch, Refills: 0    predniSONE (STERAPRED UNI-PAK 21 TAB) 10 MG (21) TBPK tablet Take 1 tablet (10 mg total) by mouth daily. Take 6 tablets by mouth for 1 day followed by  5 tablets by mouth for 1 day followed by  4 tablets by mouth for 1 day followed by  3 tablets by mouth for 1 day followed by  2 tablets by mouth for 1 day followed by  1 tablet by mouth for a day and stop Qty: 21 tablet, Refills: 0      CONTINUE these medications which have CHANGED   Details  albuterol-ipratropium (COMBIVENT) 18-103 MCG/ACT inhaler Inhale 2 puffs into the lungs every 4 (four) hours as needed for wheezing or shortness of breath. Qty: 1 Inhaler, Refills: 0      CONTINUE these medications which  have NOT CHANGED   Details  acetaminophen (TYLENOL) 325 MG tablet Take 650 mg by mouth every 4 (four) hours as needed.    albuterol (PROVENTIL) (2.5 MG/3ML) 0.083% nebulizer solution Take 2.5 mg by nebulization every 2 (two) hours as needed for wheezing or shortness of breath.    buPROPion (WELLBUTRIN SR) 100 MG 12 hr tablet Take 100 mg by mouth every morning.    DULoxetine (CYMBALTA) 60 MG capsule Take 120 mg by mouth daily.      furosemide (LASIX) 40 MG tablet Take 40 mg by mouth daily.     metFORMIN (GLUMETZA) 500 MG (MOD) 24 hr tablet Take 2 tablets (1,000 mg total) by mouth 2 (two) times daily. Reported on 06/26/2015 Qty: 60 tablet, Refills: 0    mirtazapine (REMERON) 45 MG tablet Take 45 mg by mouth at bedtime.    mometasone-formoterol (DULERA) 100-5 MCG/ACT AERO Inhale 2 puffs into the lungs 2 (two) times daily. Qty: 1 Inhaler, Refills: 0    oxyCODONE (OXYCONTIN) 20 mg 12 hr tablet Take 20 mg by mouth every 12 (twelve) hours.    Oxycodone HCl 10 MG TABS Take 10 mg by mouth every 6 (six) hours as needed.    polyethylene glycol (MIRALAX / GLYCOLAX) packet Take 17 g by mouth daily.    risperiDONE (RISPERDAL) 2 MG tablet Take 2 mg by mouth at bedtime.    roflumilast (DALIRESP) 500 MCG TABS tablet Take 500 mcg by mouth daily.    tiotropium (SPIRIVA) 18 MCG inhalation capsule Place 18 mcg into inhaler and inhale daily.    senna (SENNA-LAX) 8.6 MG tablet Take 2 tablets by mouth daily as needed for constipation.      STOP taking these medications     diltiazem (CARDIZEM) 120 MG tablet          DISCHARGE INSTRUCTIONS:   Continue 5 L of oxygen via nasal cannula Follow-up with [facility] Stop smoking  DIET:  Diabetic diet  DISCHARGE CONDITION:  Fair  ACTIVITY:  Activity as tolerated  OXYGEN:  Home Oxygen: Yes.     Oxygen Delivery: 5 liters/min via Patient connected to nasal cannula oxygen  DISCHARGE LOCATION:   group home   If you experience worsening of your admission symptoms, develop shortness of breath, life threatening emergency, suicidal or homicidal thoughts you must seek medical attention immediately by calling 911 or calling your MD immediately  if symptoms less severe.  You Must read complete instructions/literature along with all the possible adverse reactions/side effects for all the Medicines you take and that have been prescribed  to you. Take any new Medicines after you have completely understood and accpet all the possible adverse reactions/side effects.   Please note  You were cared for by a hospitalist during your hospital stay. If you have any questions about your discharge medications or the care you received while you were in the hospital after you are discharged, you can call the unit and asked to speak with the hospitalist on call if the hospitalist that took care of you is not available. Once you are discharged, your primary care physician will handle any further medical issues. Please note that NO REFILLS for any discharge medications will be authorized once you are discharged, as it is imperative that you return to your primary care physician (or establish a relationship with a primary care physician if you do not have one) for your aftercare needs so that they can reassess your  need for medications and monitor your lab values.     Today  Chief Complaint  Patient presents with  . Chest Pain   Patient is doing fine. Denies any chest pain. Shortness of breath at his baseline. Does not walk more than 20 feet at home. Patient ambulated in the hallway and pulse ox was at 92% on 5 L of oxygen  ROS:  CONSTITUTIONAL: Denies fevers, chills. Denies any fatigue, weakness.  EYES: Denies blurry vision, double vision, eye pain. EARS, NOSE, THROAT: Denies tinnitus, ear pain, hearing loss. RESPIRATORY: Denies cough, wheeze, shortness of breath.  CARDIOVASCULAR: Denies chest pain, palpitations, edema.  GASTROINTESTINAL: Denies nausea, vomiting, diarrhea, abdominal pain. Denies bright red blood per rectum. GENITOURINARY: Denies dysuria, hematuria. ENDOCRINE: Denies nocturia or thyroid problems. HEMATOLOGIC AND LYMPHATIC: Denies easy bruising or bleeding. SKIN: Denies rash or lesion. MUSCULOSKELETAL: Denies pain in neck, back, shoulder, knees, hips or arthritic symptoms.  NEUROLOGIC: Denies paralysis, paresthesias.   PSYCHIATRIC: Denies anxiety or depressive symptoms.   VITAL SIGNS:  Blood pressure (!) 144/87, pulse 95, temperature 98 F (36.7 C), resp. rate 18, height 6' (1.829 m), weight 87.8 kg (193 lb 9 oz), SpO2 95 %.  I/O:    Intake/Output Summary (Last 24 hours) at 12/12/15 1221 Last data filed at 12/12/15 0900  Gross per 24 hour  Intake             1080 ml  Output             1795 ml  Net             -715 ml    PHYSICAL EXAMINATION:  GENERAL:  50 y.o.-year-old patient lying in the bed with no acute distress.  EYES: Pupils equal, round, reactive to light and accommodation. No scleral icterus. Extraocular muscles intact.  HEENT: Head atraumatic, normocephalic. Oropharynx and nasopharynx clear.  NECK:  Supple, no jugular venous distention. No thyroid enlargement, no tenderness.  LUNGS: Moderate breath sounds bilaterally, no wheezing, rales,rhonchi or crepitation. No use of accessory muscles of respiration.  CARDIOVASCULAR: S1, S2 normal. No murmurs, rubs, or gallops.  ABDOMEN: Soft, non-tender, non-distended. Bowel sounds present. No organomegaly or mass.  EXTREMITIES: No pedal edema, cyanosis, or clubbing.  NEUROLOGIC: Cranial nerves II through XII are intact. Muscle strength 5/5 in all extremities. Sensation intact. Gait not checked.  PSYCHIATRIC: The patient is alert and oriented x 3.  SKIN: No obvious rash, lesion, or ulcer.   DATA REVIEW:   CBC  Recent Labs Lab 12/10/15 0342  WBC 5.2  HGB 12.1*  HCT 36.2*  PLT 138*    Chemistries   Recent Labs Lab 12/10/15 0342  NA 139  K 3.8  CL 101  CO2 32  GLUCOSE 151*  BUN 9  CREATININE 0.54*  CALCIUM 8.4*    Cardiac Enzymes  Recent Labs Lab 12/10/15 2138  TROPONINI 0.03*    Microbiology Results  Results for orders placed or performed during the hospital encounter of 12/09/15  MRSA PCR Screening     Status: None   Collection Time: 12/09/15  7:23 PM  Result Value Ref Range Status   MRSA by PCR NEGATIVE  NEGATIVE Final    Comment:        The GeneXpert MRSA Assay (FDA approved for NASAL specimens only), is one component of a comprehensive MRSA colonization surveillance program. It is not intended to diagnose MRSA infection nor to guide or monitor treatment for MRSA infections.     RADIOLOGY:  Dg Chest [facility]  1 View  Result Date: 12/09/2015 CLINICAL DATA:  Difficulty breathing.  Chest tightness. EXAM: PORTABLE CHEST 1 VIEW COMPARISON:  10/24/2015. FINDINGS: Surgical staples right chest. Mediastinum and hilar structures normal. Cardiomegaly with normal pulmonary vascularity. No focal infiltrate. No pleural effusion or pneumothorax . IMPRESSION: Mild cardiomegaly. No focal pulmonary infiltrate. No pleural effusion or pneumothorax. Electronically Signed   By: Marcello Moores  Register   On: 12/09/2015 09:14    EKG:   Orders placed or performed during the hospital encounter of 12/09/15  . ED EKG  . ED EKG  . EKG 12-Lead  . EKG 12-Lead      Management plans discussed with the patient, family and they are in agreement.  CODE STATUS:     Code Status Orders        Start     Ordered   12/09/15 1137  Full code  Continuous     12/09/15 1139    Code Status History    Date Active Date Inactive Code Status Order ID Comments User Context   10/24/2015  1:10 PM 10/27/2015  2:10 PM DNR [id number]  Gladstone Lighter, MD ED   10/20/2015 10:51 AM 10/23/2015  4:37 PM DNR [id number]  Laverle Hobby, MD Inpatient   10/19/2015  8:55 AM 10/20/2015 10:51 AM Full Code [id number]  Dustin Flock, MD Inpatient   10/18/2015 12:29 PM 10/19/2015  8:55 AM DNR [id number]  Laverle Hobby, MD Inpatient   10/18/2015  8:53 AM 10/18/2015 12:29 PM Full Code [id number]  Loletha Grayer, MD Inpatient   10/15/2015  4:43 PM 10/18/2015  8:53 AM DNR [id number]  Lytle Butte, MD ED   10/15/2015  4:43 PM 10/15/2015  4:43 PM Full Code [id number]  Lytle Butte, MD ED   06/26/2015 10:13 AM 06/28/2015  6:05 PM DNR [id number]  Bettey Costa, MD  Inpatient   06/26/2015  6:05 AM 06/26/2015 10:13 AM Full Code [id number]  Saundra Shelling, MD Inpatient   06/08/2015 12:15 AM 06/10/2015  4:32 PM Full Code [id number]  Saundra Shelling, MD Inpatient      TOTAL TIME TAKING CARE OF THIS PATIENT: 45  minutes.   Note: This dictation was prepared with Dragon dictation along with smaller phrase technology. Any transcriptional errors that result from this process are unintentional.   '@MEC'$ @  on 12/12/2015 at 12:21 PM  Between 7am to 6pm - Pager - [phone]  After 6pm go to [email or web address] - password [email or web address] [facility]  [place] [facility]  Office  [phone]  CC: Primary care physician; No PCP Per Patient

## 2015-12-12 NOTE — Care Management (Signed)
Barrier to discharge: COPD excerebration with IV steroids. Tachycardia due to respiratory distress, cardizem increased. Will return to [facility] with [place]

## 2015-12-12 NOTE — Progress Notes (Signed)
Pt discharged to [facility] via wc.  Instructions and rx given to pt.  Questions answered.  No distress.

## 2015-12-12 NOTE — Clinical Social Work Note (Signed)
Clinical Social Work Assessment  Patient Details  Name: Craig Wright MRN: [id number] Date of Birth: September 08, 1965  Date of referral:  12/12/15               Reason for consult:  Facility Placement                Permission sought to share information with:  Facility Sport and exercise psychologist Permission granted to share information::  Yes, Verbal Permission Granted  Name::     Sydnee Levans Mother [phone]  [phone]   Agency::  [facility]  Relationship::     Contact Information:     Housing/Transportation Living arrangements for the past 2 months:  [place] of Information:  Patient Patient Interpreter Needed:  None Criminal Activity/Legal Involvement Pertinent to Current Situation/Hospitalization:  No - Comment as needed Significant Relationships:  Other Family Members Lives with:  Other (Comment) (Group Home) Do you feel safe going back to the place where you live?  Yes Need for family participation in patient care:  No (Coment)  Care giving concerns:  Patient does not have any concerns about returning back to group home.   Social Worker assessment / plan:  Patient is a 50 year old male who lives in a group home.  Patient is alert and oriented x4 and able to express his feelings.  Patient states he has been living in the group home for a few years and he is mostly satisfied with it.  Patient expressed he does not have any issues returning back to group home.  Patient gave permission to MSW to contact group home to facilitate discharge planning.  Employment status:  Disabled (Comment on whether or not currently receiving Disability) Insurance information:  [place]icaid [place] PT Recommendations:  Not assessed at this time Information / Referral to community resources:     Patient/Family's Response to care:  Patient in agreement to returning back to group home.  Patient/Family's Understanding of and Emotional Response to Diagnosis, Current Treatment, and  Prognosis:  Patient is aware of current treatment plan and diagnosis.  Emotional Assessment Appearance:  Appears stated age Attitude/Demeanor/Rapport:    Affect (typically observed):  Appropriate, Blunt Orientation:  Oriented to Self, Oriented to Place, Oriented to  Time, Oriented to Situation Alcohol / Substance use:  Tobacco Use Psych involvement (Current and /or in the community):  Yes (Comment)  Discharge Needs  Concerns to be addressed:  No discharge needs identified Readmission within the last 30 days:  No Current discharge risk:  None Barriers to Discharge:  No Barriers Identified   Ross Ludwig 12/12/2015, 3:27 PM

## 2015-12-16 NOTE — Progress Notes (Signed)
[facility]  Patient Status: Closed, patient became belligerent with [place] SW on 10/20 and proceeded to make them leave. Refused services. Patient was HRI, notified CM.   Florene Glen 12/16/2015, 8:58 AM

## 2015-12-19 ENCOUNTER — Emergency Department: Payer: Medicaid Other

## 2015-12-19 ENCOUNTER — Emergency Department
Admission: EM | Admit: 2015-12-19 | Discharge: 2015-12-19 | Disposition: A | Discharge status: Home / Self Care | Payer: Medicaid Other | Attending: Emergency Medicine | Admitting: Emergency Medicine

## 2015-12-19 DIAGNOSIS — J441 Chronic obstructive pulmonary disease with (acute) exacerbation: Secondary | ICD-10-CM | POA: Diagnosis not present

## 2015-12-19 DIAGNOSIS — J45909 Unspecified asthma, uncomplicated: Secondary | ICD-10-CM | POA: Insufficient documentation

## 2015-12-19 DIAGNOSIS — R0602 Shortness of breath: Secondary | ICD-10-CM | POA: Diagnosis present

## 2015-12-19 DIAGNOSIS — F17210 Nicotine dependence, cigarettes, uncomplicated: Secondary | ICD-10-CM | POA: Diagnosis not present

## 2015-12-19 DIAGNOSIS — Z7984 Long term (current) use of oral hypoglycemic drugs: Secondary | ICD-10-CM | POA: Diagnosis not present

## 2015-12-19 DIAGNOSIS — I2510 Atherosclerotic heart disease of native coronary artery without angina pectoris: Secondary | ICD-10-CM | POA: Insufficient documentation

## 2015-12-19 DIAGNOSIS — E119 Type 2 diabetes mellitus without complications: Secondary | ICD-10-CM | POA: Diagnosis not present

## 2015-12-19 DIAGNOSIS — I10 Essential (primary) hypertension: Secondary | ICD-10-CM | POA: Diagnosis not present

## 2015-12-19 DIAGNOSIS — R1084 Generalized abdominal pain: Secondary | ICD-10-CM | POA: Diagnosis not present

## 2015-12-19 HISTORY — DX: Unspecified atrial fibrillation: I48.91

## 2015-12-19 HISTORY — DX: Atherosclerotic heart disease of native coronary artery without angina pectoris: I25.10

## 2015-12-19 LAB — BASIC METABOLIC PANEL
Anion gap: 8 (ref 5–15)
BUN: 5 mg/dL — AB (ref 6–20)
CALCIUM: 8.8 mg/dL — AB (ref 8.9–10.3)
CHLORIDE: 92 mmol/L — AB (ref 101–111)
CO2: 39 mmol/L — AB (ref 22–32)
CREATININE: 0.64 mg/dL (ref 0.61–1.24)
GFR calc non Af Amer: >60 mL/min (ref >60)
Glucose, Bld: 197 mg/dL — ABNORMAL HIGH (ref 65–99)
Potassium: 3.2 mmol/L — ABNORMAL LOW (ref 3.5–5.1)
SODIUM: 139 mmol/L (ref 135–145)

## 2015-12-19 LAB — CBC
HCT: 39.3 % — ABNORMAL LOW (ref 40.0–52.0)
Hemoglobin: 12.9 g/dL — ABNORMAL LOW (ref 13.0–18.0)
MCH: 29.4 pg (ref 26.0–34.0)
MCHC: 32.7 g/dL (ref 32.0–36.0)
MCV: 90 fL (ref 80.0–100.0)
PLATELETS: 118 10*3/uL — AB (ref 150–440)
RBC: 4.36 MIL/uL — AB (ref 4.40–5.90)
RDW: 15.4 % — AB (ref 11.5–14.5)
WBC: 4.7 10*3/uL (ref 3.8–10.6)

## 2015-12-19 LAB — HEPATIC FUNCTION PANEL
ALK PHOS: 58 U/L (ref 38–126)
ALT: 104 U/L — AB (ref 17–63)
AST: 68 U/L — ABNORMAL HIGH (ref 15–41)
Albumin: 3.3 g/dL — ABNORMAL LOW (ref 3.5–5.0)
BILIRUBIN DIRECT: 0.5 mg/dL (ref 0.1–0.5)
BILIRUBIN INDIRECT: 0.3 mg/dL (ref 0.3–0.9)
BILIRUBIN TOTAL: 0.8 mg/dL (ref 0.3–1.2)
Total Protein: 6.4 g/dL — ABNORMAL LOW (ref 6.5–8.1)

## 2015-12-19 LAB — BRAIN NATRIURETIC PEPTIDE: B NATRIURETIC PEPTIDE 5: 60 pg/mL (ref 0.0–100.0)

## 2015-12-19 LAB — TROPONIN I: Troponin I: <0.03 ng/mL (ref <0.03)

## 2015-12-19 LAB — FIBRIN DERIVATIVES D-DIMER (ARMC ONLY): Fibrin derivatives D-dimer (ARMC): 478 (ref 0–499)

## 2015-12-19 MED ORDER — ALBUTEROL SULFATE (2.5 MG/3ML) 0.083% IN NEBU: 5.0000 mg | INHALATION_SOLUTION | Freq: Once | RESPIRATORY_TRACT | Status: DC

## 2015-12-19 MED ORDER — IPRATROPIUM-ALBUTEROL 0.5-2.5 (3) MG/3ML IN SOLN
3.0000 mL | Freq: Once | RESPIRATORY_TRACT | Status: DC
  Filled 2015-12-19: qty 3

## 2015-12-19 MED ORDER — FUROSEMIDE 10 MG/ML IJ SOLN
20.0000 mg | Freq: Once | INTRAMUSCULAR | Status: AC
  Administered 2015-12-19: 20 mg via INTRAVENOUS
  Filled 2015-12-19: qty 4

## 2015-12-19 MED ORDER — PREDNISONE 20 MG PO TABS: 40.0000 mg | ORAL_TABLET | Freq: Every day | ORAL | 0 refills | Status: AC

## 2015-12-19 NOTE — ED Provider Notes (Signed)
Time Seen: Approximately *1751  I have reviewed the triage notes  Chief Complaint: Shortness of Breath   History of Present Illness: Craig Wright is a 50 y.o. male who presents with acute respiratory distress. Patient has a history of COPD and is on supplemental oxygen therapy. EMS was notified due to the wet cough and shortness of breath throughout the day. He was given 2 DuoNeb's and 2 albuterol nebulizations prior to arrival and 125 mg of IV Solu-Medrol. He appears to be normal pulse oximetry now on his typical 3 L nasal cannula. An sprain concerned today to this historian seems to be some diffuse abdominal pain and swelling. He does have a history of hepatitis C   Past Medical History:  Diagnosis Date  . A-fib ([place])   . Anxiety disorder   . Asthma   . COPD (chronic obstructive pulmonary disease) ([place])   . Coronary artery disease   . Diabetes mellitus without complication ([place])   . Hypertension     Patient Active Problem List   Diagnosis Date Noted  . COPD exacerbation ([place]) 10/24/2015  . Chronic respiratory failure with hypoxia ([place])   . Palliative care by specialist   . DNR (do not resuscitate)   . Weakness generalized   . Acute on chronic respiratory failure with hypoxia ([place]) 10/15/2015  . Dyspnea 06/07/2015    Past Surgical History:  Procedure Laterality Date  . KNEE SURGERY Left   . ORTHOPEDIC SURGERY     multiple  . SKIN GRAFT    . TOTAL HIP ARTHROPLASTY Left     Past Surgical History:  Procedure Laterality Date  . KNEE SURGERY Left   . ORTHOPEDIC SURGERY     multiple  . SKIN GRAFT    . TOTAL HIP ARTHROPLASTY Left     Current Outpatient Rx  . Order #: [id number] Class: Historical Med  . Order #: [id number] Class: Historical Med  . Order #: [id number] Class: Print  . Order #: [id number] Class: Historical Med  . Order #: [id number] Class: Print  . Order #: [id number] Class: Historical Med  . Order #: [id number] Class: Historical Med  . Order #:  [id number] Class: Print  . Order #: [id number] Class: Historical Med  . Order #: [id number] Class: Normal  . Order #: [id number] Class: Print  . Order #: [id number] Class: Historical Med  . Order #: [id number] Class: Historical Med  . Order #: [id number] Class: Historical Med  . [START ON 12/20/2015] Order #: [id number] Class: Print  . Order #: [id number] Class: Historical Med  . Order #: [id number] Class: Historical Med  . Order #: [id number] Class: Historical Med  . Order #: [id number] Class: Historical Med    Allergies:  Review of patient's allergies indicates no known allergies.  Family History: Family History  Problem Relation Age of Onset  . Cervical cancer Mother   . Colon cancer Father     Social History: Social History  Substance Use Topics  . Smoking status: Current Some Day Smoker    Packs/day: 1.00    Years: 40.00    Types: Cigarettes  . Smokeless tobacco: Never Used  . Alcohol use No     Review of Systems:   10 point review of systems was performed and was otherwise negative:  Constitutional: No fever Eyes: No visual disturbances ENT: No sore throat, ear pain Cardiac: No chest pain Respiratory: No shortness of breath, Without stridor Abdomen: No abdominal pain, no vomiting, No diarrhea Endocrine: No weight loss, No night sweats Extremities: No change in his chronic peripheral edema Skin: No rashes, easy bruising Neurologic:  No focal weakness, trouble with speech or swollowing Urologic: No dysuria, Hematuria, or urinary frequency Cough is been clear sputum  Physical Exam:  ED Triage Vitals  Enc Vitals Group     BP 12/19/15 1714 (!) 155/89     Pulse Rate 12/19/15 1714 (!) 107     Resp 12/19/15 1714 (!) 24     Temp 12/19/15 1714 (!) 96.2 F (35.7 C)     Temp Source 12/19/15 1714 Tympanic     SpO2 12/19/15 1714 95 %     Weight 12/19/15 1717 193 lb (87.5 kg)     Height 12/19/15 1717 6' (1.829 m)     Head Circumference --      Peak Flow --      Pain Score 12/19/15  1718 5     Pain Loc --      Pain Edu? --      Excl. in [place]? --     General: Awake , Alert , and Oriented times 3; GCS 15 Head: Normal cephalic , atraumatic Eyes: Pupils equal , round, reactive to light Nose/Throat: No nasal drainage, patent upper airway without erythema or exudate.  Neck: Supple, Full range of motion, No anterior adenopathy or palpable thyroid masses Lungs: Diminished breath sounds bilaterally to bases with some mild rhonchi and no rales or wheezing are noted  Heart: Irregular rate, irregular rhythm without murmurs , gallops , or rubs Abdomen: Soft, non tender without rebound, guarding , or rigidity; bowel sounds positive and symmetric in all 4 quadrants. No organomegaly .  No obvious fluid wave      Extremities: Circumferential peripheral edema bilateral lower extremities appears equal Neurologic: normal ambulation, Motor symmetric without deficits, sensory intact Skin: warm, dry, no rashes   Labs:   All laboratory work was reviewed including any pertinent negatives or positives listed below:  Labs Reviewed  BASIC METABOLIC PANEL - Abnormal; Notable for the following:       Result Value   Potassium 3.2 (*)    Chloride 92 (*)    CO2 39 (*)    Glucose, Bld 197 (*)    BUN 5 (*)    Calcium 8.8 (*)    All other components within normal limits  CBC - Abnormal; Notable for the following:    RBC 4.36 (*)    Hemoglobin 12.9 (*)    HCT 39.3 (*)    RDW 15.4 (*)    Platelets 118 (*)    All other components within normal limits  HEPATIC FUNCTION PANEL - Abnormal; Notable for the following:    Total Protein 6.4 (*)    Albumin 3.3 (*)    AST 68 (*)    ALT 104 (*)    All other components within normal limits  BRAIN NATRIURETIC PEPTIDE  FIBRIN DERIVATIVES D-DIMER ([facility] ONLY)  TROPONIN I    EKG:  ED ECG REPORT I, Daymon Larsen, the attending physician, personally viewed and interpreted this ECG.  Date: 12/19/2015 EKG Time: *1509 Rate: 117 Rhythm: Sinus  tachycardia with occasional PACs QRS Axis: normal Intervals: normal ST/T Wave abnormalities: normal Conduction Disturbances: none Narrative Interpretation: unremarkable No acute ischemic changes   Radiology:  "Dg Chest 2 View  Result Date: 12/19/2015 CLINICAL DATA:  Shortness of breath since this morning EXAM: CHEST  2 VIEW COMPARISON:  December 09, 2015 FINDINGS: The heart size and mediastinal contours are within normal limits. There is no focal infiltrate, pulmonary edema, or pleural effusion. The visualized skeletal structures are stable.  Surgical clips are identified in the right axilla unchanged. IMPRESSION: No active cardiopulmonary disease. Electronically Signed   By: Abelardo Diesel M.D.   On: 12/19/2015 18:26   Dg Chest Port 1 View  Result Date: 12/09/2015 CLINICAL DATA:  Difficulty breathing.  Chest tightness. EXAM: PORTABLE CHEST 1 VIEW COMPARISON:  10/24/2015. FINDINGS: Surgical staples right chest. Mediastinum and hilar structures normal. Cardiomegaly with normal pulmonary vascularity. No focal infiltrate. No pleural effusion or pneumothorax . IMPRESSION: Mild cardiomegaly. No focal pulmonary infiltrate. No pleural effusion or pneumothorax. Electronically Signed   By: Marcello Moores  Register   On: 12/09/2015 09:14   Ct Renal Stone Study  Result Date: 12/19/2015 CLINICAL DATA:  Diffuse abdominal pain and flank pain. EXAM: CT ABDOMEN AND PELVIS WITHOUT CONTRAST TECHNIQUE: Multidetector CT imaging of the abdomen and pelvis was performed following the standard protocol without IV contrast. COMPARISON:  None. FINDINGS: Lower chest: No acute findings. Hepatobiliary: No mass visualized on this unenhanced exam. Gallbladder is unremarkable. Pancreas: No mass or inflammatory process visualized on this unenhanced exam. Spleen: Upper limits of normal in size measuring approximately 13 cm in length. Adrenals/Urinary tract: No evidence of urolithiasis or hydronephrosis. Unremarkable appearance of  bladder. Stomach/Bowel: No evidence of obstruction, inflammatory process, or abnormal fluid collections. Vascular/Lymphatic: No pathologically enlarged lymph nodes identified. No evidence of abdominal aortic aneurysm. Aortic atherosclerosis. Reproductive:  No mass or other significant abnormality. Other: Mild ascites seen in the right upper quadrant of the abdomen and pelvis. Musculoskeletal: No suspicious bone lesions identified. Old bilateral pubic rami fracture deformities are noted as well as surgical fixation hardware in the left ilium and a bipolar left hip prosthesis. IMPRESSION: Mild ascites within the abdomen and pelvis.  Mild splenomegaly. No mass, lymphadenopathy, or inflammatory process identified. Aortic atherosclerosis. Electronically Signed   By: Earle Gell M.D.   On: 12/19/2015 18:59  "  I personally reviewed the radiologic studies    ED Course:  Patient's stay here was unremarkable and had symptomatic improvement and was given one single DuoNeb here. His pulse oximetry remained stable and the patient was resting comfortably at the time of his discharge. He does have some mild ascites that I felt was unlikely of peritonitis at this time. I felt the rest of his concerns could be addressed on an outpatient basis. Clinical Course     Assessment: * Acute exacerbation of chronic obstructive pulmonary disease    Final Clinical Impression:   Final diagnoses:  Abdominal pain, diffuse  COPD exacerbation ([place])     Plan: * Outpatient " Discharge Medication List as of 12/19/2015  9:36 PM    START taking these medications   Details  predniSONE (DELTASONE) 20 MG tablet Take 2 tablets (40 mg total) by mouth daily., Starting Fri 12/20/2015, Until Wed 12/25/2015, Print      " Patient was advised to return immediately if condition worsens. Patient was advised to follow up with their primary care physician or other specialized physicians involved in their outpatient care. The  patient and/or family member/power of attorney had laboratory results reviewed at the bedside. All questions and concerns were addressed and appropriate discharge instructions were distributed by the nursing staff.             Daymon Larsen, MD 12/19/15 [phone]

## 2015-12-19 NOTE — ED Triage Notes (Signed)
Pt arrived via EMS with 4th neb in progress. Wet loose cough noted. Pt state clear sputum.Pt states SOB the entire day. EMS provided duoneb X 2 and alb neb X 2.PIV started per RAC and 125 mg solumedrol ivsp given.

## 2015-12-19 NOTE — Discharge Instructions (Signed)
Please return immediately if condition worsens. Please contact her primary physician or the physician you were given for referral. If you have any specialist physicians involved in her treatment and plan please also contact them. Thank you for using [facility]. Patient's chest x-ray did not show any new findings nor his abdominal CT other than what appears to be chronic cirrhosis with ascites. The patient require outpatient assessment and possible scheduling of peritoneal drainage. This can be done on an outpatient basis if the abdomen continues to retain fluid

## 2015-12-19 NOTE — ED Notes (Signed)
Dr Marcelene Butte in room with pt, Pt now c/o abd pain also

## 2015-12-20 DIAGNOSIS — B182 Chronic viral hepatitis C: Secondary | ICD-10-CM | POA: Insufficient documentation

## 2015-12-29 ENCOUNTER — Emergency Department: Payer: Medicaid Other

## 2015-12-29 ENCOUNTER — Emergency Department
Admission: EM | Admit: 2015-12-29 | Discharge: 2015-12-29 | Disposition: A | Discharge status: Home / Self Care | Payer: Medicaid Other | Attending: Emergency Medicine | Admitting: Emergency Medicine

## 2015-12-29 DIAGNOSIS — I110 Hypertensive heart disease with heart failure: Secondary | ICD-10-CM | POA: Insufficient documentation

## 2015-12-29 DIAGNOSIS — R0602 Shortness of breath: Secondary | ICD-10-CM | POA: Diagnosis present

## 2015-12-29 DIAGNOSIS — Z7984 Long term (current) use of oral hypoglycemic drugs: Secondary | ICD-10-CM | POA: Diagnosis not present

## 2015-12-29 DIAGNOSIS — J45909 Unspecified asthma, uncomplicated: Secondary | ICD-10-CM | POA: Insufficient documentation

## 2015-12-29 DIAGNOSIS — I509 Heart failure, unspecified: Secondary | ICD-10-CM | POA: Diagnosis not present

## 2015-12-29 DIAGNOSIS — E119 Type 2 diabetes mellitus without complications: Secondary | ICD-10-CM | POA: Insufficient documentation

## 2015-12-29 DIAGNOSIS — F17210 Nicotine dependence, cigarettes, uncomplicated: Secondary | ICD-10-CM | POA: Insufficient documentation

## 2015-12-29 DIAGNOSIS — I2510 Atherosclerotic heart disease of native coronary artery without angina pectoris: Secondary | ICD-10-CM | POA: Insufficient documentation

## 2015-12-29 DIAGNOSIS — J441 Chronic obstructive pulmonary disease with (acute) exacerbation: Secondary | ICD-10-CM | POA: Diagnosis not present

## 2015-12-29 DIAGNOSIS — R1011 Right upper quadrant pain: Secondary | ICD-10-CM | POA: Diagnosis not present

## 2015-12-29 DIAGNOSIS — Z79899 Other long term (current) drug therapy: Secondary | ICD-10-CM | POA: Diagnosis not present

## 2015-12-29 HISTORY — DX: Heart failure, unspecified: I50.9

## 2015-12-29 LAB — CBC WITH DIFFERENTIAL/PLATELET
Basophils Absolute: 0 10*3/uL (ref 0–0.1)
Basophils Relative: 0 %
Eosinophils Absolute: 0 10*3/uL (ref 0–0.7)
Eosinophils Relative: 1 %
HEMATOCRIT: 39 % — AB (ref 40.0–52.0)
Hemoglobin: 12.9 g/dL — ABNORMAL LOW (ref 13.0–18.0)
LYMPHS ABS: 0.6 10*3/uL — AB (ref 1.0–3.6)
LYMPHS PCT: 14 %
MCH: 30 pg (ref 26.0–34.0)
MCHC: 33.1 g/dL (ref 32.0–36.0)
MCV: 90.7 fL (ref 80.0–100.0)
MONO ABS: 0.4 10*3/uL (ref 0.2–1.0)
MONOS PCT: 10 %
NEUTROS ABS: 2.8 10*3/uL (ref 1.4–6.5)
Neutrophils Relative %: 75 %
Platelets: 81 10*3/uL — ABNORMAL LOW (ref 150–440)
RBC: 4.3 MIL/uL — ABNORMAL LOW (ref 4.40–5.90)
RDW: 15.7 % — AB (ref 11.5–14.5)
WBC: 3.8 10*3/uL (ref 3.8–10.6)

## 2015-12-29 LAB — HEPATIC FUNCTION PANEL
ALK PHOS: 61 U/L (ref 38–126)
ALT: 73 U/L — ABNORMAL HIGH (ref 17–63)
AST: 64 U/L — ABNORMAL HIGH (ref 15–41)
Albumin: 3.1 g/dL — ABNORMAL LOW (ref 3.5–5.0)
BILIRUBIN DIRECT: 2.9 mg/dL — AB (ref 0.1–0.5)
BILIRUBIN TOTAL: 4.8 mg/dL — AB (ref 0.3–1.2)
Indirect Bilirubin: 1.9 mg/dL — ABNORMAL HIGH (ref 0.3–0.9)
Total Protein: 5.8 g/dL — ABNORMAL LOW (ref 6.5–8.1)

## 2015-12-29 LAB — BASIC METABOLIC PANEL
Anion gap: 6 (ref 5–15)
CHLORIDE: 93 mmol/L — AB (ref 101–111)
CO2: 38 mmol/L — AB (ref 22–32)
Calcium: 8.6 mg/dL — ABNORMAL LOW (ref 8.9–10.3)
Creatinine, Ser: 0.51 mg/dL — ABNORMAL LOW (ref 0.61–1.24)
GFR calc Af Amer: >60 mL/min (ref >60)
GLUCOSE: 137 mg/dL — AB (ref 65–99)
POTASSIUM: 3.1 mmol/L — AB (ref 3.5–5.1)
Sodium: 137 mmol/L (ref 135–145)

## 2015-12-29 LAB — TROPONIN I: Troponin I: <0.03 ng/mL (ref <0.03)

## 2015-12-29 LAB — BRAIN NATRIURETIC PEPTIDE: B NATRIURETIC PEPTIDE 5: 36 pg/mL (ref 0.0–100.0)

## 2015-12-29 LAB — LIPASE, BLOOD: Lipase: 11 U/L (ref 11–51)

## 2015-12-29 MED ORDER — METHYLPREDNISOLONE SODIUM SUCC 125 MG IJ SOLR
125.0000 mg | Freq: Once | INTRAMUSCULAR | Status: AC
  Administered 2015-12-29: 125 mg via INTRAVENOUS
  Filled 2015-12-29: qty 2

## 2015-12-29 MED ORDER — PREDNISONE 20 MG PO TABS: 60.0000 mg | ORAL_TABLET | Freq: Every day | ORAL | 0 refills | Status: DC

## 2015-12-29 MED ORDER — IPRATROPIUM-ALBUTEROL 0.5-2.5 (3) MG/3ML IN SOLN
3.0000 mL | Freq: Once | RESPIRATORY_TRACT | Status: AC
  Administered 2015-12-29: 3 mL via RESPIRATORY_TRACT
  Filled 2015-12-29: qty 3

## 2015-12-29 MED ORDER — MAGNESIUM SULFATE 2 GM/50ML IV SOLN
2.0000 g | Freq: Once | INTRAVENOUS | Status: AC
  Administered 2015-12-29: 2 g via INTRAVENOUS
  Filled 2015-12-29: qty 50

## 2015-12-29 MED ORDER — ALBUTEROL SULFATE (2.5 MG/3ML) 0.083% IN NEBU
2.5000 mg | INHALATION_SOLUTION | Freq: Once | RESPIRATORY_TRACT | Status: AC
  Administered 2015-12-29: 2.5 mg via RESPIRATORY_TRACT
  Filled 2015-12-29: qty 3

## 2015-12-29 NOTE — ED Notes (Signed)
MD Dahlia Client at bedside.

## 2015-12-29 NOTE — ED Notes (Signed)
Patient continues to rest in room with NAD noted. VSS as charted. PO fluids given; patient toileted. No further needs. Will continue to monitor.

## 2015-12-29 NOTE — ED Notes (Signed)
Spoke with Jana Half on the phone who is an employee at the group home she will there to arrive pt. Given discharge instructions and follow up to her also.

## 2015-12-29 NOTE — ED Notes (Signed)
Care of patient assumed at this time. Patient resting in room with NAD noted. No verbalized needs. Will continue to monitor.

## 2015-12-29 NOTE — ED Provider Notes (Signed)
[facility] Emergency Department Provider Note   ____________________________________________   First MD Initiated Contact with Patient 12/29/15 0128     (approximate)  I have reviewed the triage vital signs and the nursing notes.   HISTORY  Chief Complaint Shortness of Breath    HPI Craig Wright is a 50 y.o. male who comes into the hospital today with emphysema. The patient has been short of breath for the past 2-3 days. The patient reports that he took a breathing treatment at home but it did not help much. He's been coughing more than normal. The patient endorses some chest pain as well as some abdominal pain which she states is not that bad. The patient was 5 L of O2 at home which is his baseline. EMS had stated that the patient had CHF and he does have some swelling in his legs but he reports that this is his emphysema. The patient couldn't tolerate the symptoms anymore so he decided to come into the hospital for evaluation.   Past Medical History:  Diagnosis Date  . A-fib ([place])   . Anxiety disorder   . Asthma   . CHF (congestive heart failure) ([place])   . COPD (chronic obstructive pulmonary disease) ([place])   . Coronary artery disease   . Diabetes mellitus without complication ([place])   . Hypertension     Patient Active Problem List   Diagnosis Date Noted  . COPD exacerbation ([place]) 10/24/2015  . Chronic respiratory failure with hypoxia ([place])   . Palliative care by specialist   . DNR (do not resuscitate)   . Weakness generalized   . Acute on chronic respiratory failure with hypoxia ([place]) 10/15/2015  . Dyspnea 06/07/2015    Past Surgical History:  Procedure Laterality Date  . KNEE SURGERY Left   . ORTHOPEDIC SURGERY     multiple  . SKIN GRAFT    . TOTAL HIP ARTHROPLASTY Left     Prior to Admission medications   Medication Sig Start Date End Date Taking? Authorizing Provider  acetaminophen (TYLENOL) 325 MG tablet Take 650 mg by  mouth every 4 (four) hours as needed.    Historical Provider, MD  albuterol (PROVENTIL) (2.5 MG/3ML) 0.083% nebulizer solution Take 2.5 mg by nebulization every 2 (two) hours as needed for wheezing or shortness of breath.    Historical Provider, MD  albuterol-ipratropium (COMBIVENT) 18-103 MCG/ACT inhaler Inhale 2 puffs into the lungs every 4 (four) hours as needed for wheezing or shortness of breath. 12/12/15   Nicholes Mango, MD  buPROPion (WELLBUTRIN SR) 100 MG 12 hr tablet Take 100 mg by mouth every morning.    Historical Provider, MD  diltiazem (CARDIZEM CD) 240 MG 24 hr capsule Take 1 capsule (240 mg total) by mouth daily. 12/13/15   Nicholes Mango, MD  DULoxetine (CYMBALTA) 60 MG capsule Take 120 mg by mouth daily.     Historical Provider, MD  furosemide (LASIX) 40 MG tablet Take 40 mg by mouth daily.     Historical Provider, MD  metFORMIN (GLUMETZA) 500 MG (MOD) 24 hr tablet Take 2 tablets (1,000 mg total) by mouth 2 (two) times daily. Reported on 06/26/2015 10/27/15   Dustin Flock, MD  mirtazapine (REMERON) 45 MG tablet Take 45 mg by mouth at bedtime.    Historical Provider, MD  mometasone-formoterol (DULERA) 100-5 MCG/ACT AERO Inhale 2 puffs into the lungs 2 (two) times daily. 06/28/15   Fritzi Mandes, MD  nicotine (NICODERM CQ - DOSED IN MG/24 HOURS)  21 mg/24hr patch Place 1 patch (21 mg total) onto the skin daily. 12/13/15   Nicholes Mango, MD  oxyCODONE (OXYCONTIN) 20 mg 12 hr tablet Take 20 mg by mouth every 12 (twelve) hours.    Historical Provider, MD  Oxycodone HCl 10 MG TABS Take 10 mg by mouth every 6 (six) hours as needed.    Historical Provider, MD  polyethylene glycol (MIRALAX / GLYCOLAX) packet Take 17 g by mouth daily.    Historical Provider, MD  predniSONE (DELTASONE) 20 MG tablet Take 3 tablets (60 mg total) by mouth daily. 12/29/15   Loney Hering, MD  risperiDONE (RISPERDAL) 2 MG tablet Take 2 mg by mouth at bedtime.    Historical Provider, MD  roflumilast (DALIRESP) 500 MCG TABS  tablet Take 500 mcg by mouth daily.    Historical Provider, MD  senna (SENNA-LAX) 8.6 MG tablet Take 2 tablets by mouth daily as needed for constipation.    Historical Provider, MD  tiotropium (SPIRIVA) 18 MCG inhalation capsule Place 18 mcg into inhaler and inhale daily.    Historical Provider, MD    Allergies Patient has no known allergies.  Family History  Problem Relation Age of Onset  . Cervical cancer Mother   . Colon cancer Father     Social History Social History  Substance Use Topics  . Smoking status: Current Some Day Smoker    Packs/day: 1.00    Years: 40.00    Types: Cigarettes  . Smokeless tobacco: Never Used  . Alcohol use No    Review of Systems Constitutional: No fever/chills Eyes: No visual changes. ENT: No sore throat. Cardiovascular:  chest pain. Respiratory:  shortness of breath. Gastrointestinal: No abdominal pain.  No nausea, no vomiting.  No diarrhea.  No constipation. Genitourinary: Negative for dysuria. Musculoskeletal: Negative for back pain. Skin: Negative for rash. Neurological: Negative for headaches, focal weakness or numbness.  10-point ROS otherwise negative.  ____________________________________________   PHYSICAL EXAM:  VITAL SIGNS: ED Triage Vitals  Enc Vitals Group     BP 12/29/15 0121 125/86     Pulse Rate 12/29/15 0121 (!) 110     Resp 12/29/15 0121 (!) 22     Temp 12/29/15 0121 97.9 F (36.6 C)     Temp Source 12/29/15 0121 Oral     SpO2 12/29/15 0115 93 %     Weight 12/29/15 0122 (P) 182 lb (82.6 kg)     Height 12/29/15 0122 (P) 6' (1.829 m)     Head Circumference --      Peak Flow --      Pain Score 12/29/15 0130 8     Pain Loc --      Pain Edu? --      Excl. in [place]? --     Constitutional: Alert and oriented. Well appearing and in Mild distress. Eyes: Conjunctivae are normal. PERRL. EOMI. Head: Atraumatic. Nose: No congestion/rhinnorhea. Mouth/Throat: Mucous membranes are moist.  Oropharynx  non-erythematous. Cardiovascular: Normal rate, regular rhythm. Grossly normal heart sounds.  Good peripheral circulation. Respiratory: increased respiratory effort.  No retractions. Some mild expiratory wheezes. Gastrointestinal: Soft and nontender. No distention. Positive bowel sounds Musculoskeletal: No lower extremity tenderness nor edema.   Neurologic:  Normal speech and language.  Skin:  Skin is warm, dry and intact.  Psychiatric: Mood and affect are normal.   ____________________________________________   LABS (all labs ordered are listed, but only abnormal results are displayed)  Labs Reviewed  BASIC METABOLIC PANEL - Abnormal; Notable for  the following:       Result Value   Potassium 3.1 (*)    Chloride 93 (*)    CO2 38 (*)    Glucose, Bld 137 (*)    BUN <5 (*)    Creatinine, Ser 0.51 (*)    Calcium 8.6 (*)    All other components within normal limits  CBC WITH DIFFERENTIAL/PLATELET - Abnormal; Notable for the following:    RBC 4.30 (*)    Hemoglobin 12.9 (*)    HCT 39.0 (*)    RDW 15.7 (*)    Platelets 81 (*)    Lymphs Abs 0.6 (*)    All other components within normal limits  HEPATIC FUNCTION PANEL - Abnormal; Notable for the following:    Total Protein 5.8 (*)    Albumin 3.1 (*)    AST 64 (*)    ALT 73 (*)    Total Bilirubin 4.8 (*)    Bilirubin, Direct 2.9 (*)    Indirect Bilirubin 1.9 (*)    All other components within normal limits  TROPONIN I  BRAIN NATRIURETIC PEPTIDE  LIPASE, BLOOD  BLOOD GAS, VENOUS   ____________________________________________  EKG  ED ECG REPORT I, Loney Hering, the attending physician, personally viewed and interpreted this ECG.   Date: 12/29/2015  EKG Time: 120  Rate: 110  Rhythm: normal sinus rhythm  Axis: right axis deviation  Intervals:none  ST&T Change: normal  ____________________________________________  RADIOLOGY  CXR [place] abd  RUQ ____________________________________________   PROCEDURES  Procedure(s) performed: None  Procedures  Critical Care performed: No  ____________________________________________   INITIAL IMPRESSION / ASSESSMENT AND PLAN / ED COURSE  Pertinent labs & imaging results that were available during my care of the patient were reviewed by me and considered in my medical decision making (see chart for details).  This is a 50 year old male who comes into the hospital today with some shortness of breath. He was resting comfortably but I did give him a DuoNeb treatment as well as a dose of Solu-Medrol. I check some blood work and the patient's bilirubin is 4.8. He did report he had some abdominal pain. I will send the patient for an ultrasound of his abdomen to determine if he has some biliary disease causing his hyperbilirubinemia. I will reassess the patient.  Clinical Course as of Dec 28 824  Nancy Fetter Dec 29, 2015  0408 No acute cardiopulmonary process seen. DG Chest Port 1 View [AW]  [phone] 1. No sonographic evidence of acute cholecystitis. 2. Lobular liver pons 4 with adjacent small volume ascites may indicate hepatic cirrhosis.   US Abdomen Limited RUQ [AW]    Clinical Course User Index [AW] Loney Hering, MD   Looking at the patient's blood work he appears to be a chronic retainer. His ultrasound does not show any cause for his elevated bilirubin level. I feel that he should get this reassess as he is not in any acute distress with his abdomen. I did go back to listen to the patient and he does still have a little bit of wheezing. I will give him another albuterol neb as well as a DuoNeb and give him a dose of magnesium sulfate. If he continues to do well he will then be discharged home.  ____________________________________________   FINAL CLINICAL IMPRESSION(S) / ED DIAGNOSES  Final diagnoses:  COPD exacerbation ([place])  Shortness of breath  Hyperbilirubinemia      NEW  MEDICATIONS STARTED DURING THIS VISIT:  New Prescriptions  PREDNISONE (DELTASONE) 20 MG TABLET    Take 3 tablets (60 mg total) by mouth daily.     Note:  This document was prepared using Dragon voice recognition software and may include unintentional dictation errors.    Loney Hering, MD 12/29/15 [phone]

## 2015-12-29 NOTE — ED Notes (Signed)
Transport on scene to take him back to [facility]

## 2015-12-29 NOTE — ED Triage Notes (Signed)
Per EMS: Pt c/o SOB. Pt has hx of CHF, COPD, respiratory failure and diabetes. PT had bedbugs on patient and throughout house.

## 2015-12-29 NOTE — Discharge Instructions (Signed)
Your liver enzymes had some elevation this evening. Her ultrasound is unremarkable. The remainder of your workup is also unremarkable. I feel that he need to have her liver enzymes reevaluated. I will place her on some steroids to help with this breathing difficulty as it is improved currently. Please follow-up with the acute care clinic or your primary care physician.

## 2015-12-29 NOTE — ED Notes (Signed)
Pt waiting on ride. Report given to [place], RN

## 2015-12-29 NOTE — ED Notes (Signed)
Pt changed into clean hospital gown. Clothes double bagged. Pt awaiting EMS transport back home.

## 2016-01-23 ENCOUNTER — Emergency Department: Payer: Medicaid Other

## 2016-01-23 ENCOUNTER — Inpatient Hospital Stay
Admission: EM | Admit: 2016-01-23 | Discharge: 2016-01-26 | DRG: 444 | Disposition: A | Discharge status: Home / Self Care | Payer: Medicaid Other | Attending: Internal Medicine | Admitting: Internal Medicine

## 2016-01-23 DIAGNOSIS — E119 Type 2 diabetes mellitus without complications: Secondary | ICD-10-CM | POA: Diagnosis present

## 2016-01-23 DIAGNOSIS — I110 Hypertensive heart disease with heart failure: Secondary | ICD-10-CM | POA: Diagnosis present

## 2016-01-23 DIAGNOSIS — Z7984 Long term (current) use of oral hypoglycemic drugs: Secondary | ICD-10-CM

## 2016-01-23 DIAGNOSIS — C3431 Malignant neoplasm of lower lobe, right bronchus or lung: Secondary | ICD-10-CM | POA: Diagnosis present

## 2016-01-23 DIAGNOSIS — Z79891 Long term (current) use of opiate analgesic: Secondary | ICD-10-CM

## 2016-01-23 DIAGNOSIS — F329 Major depressive disorder, single episode, unspecified: Secondary | ICD-10-CM | POA: Diagnosis present

## 2016-01-23 DIAGNOSIS — J441 Chronic obstructive pulmonary disease with (acute) exacerbation: Secondary | ICD-10-CM

## 2016-01-23 DIAGNOSIS — I482 Chronic atrial fibrillation: Secondary | ICD-10-CM | POA: Diagnosis present

## 2016-01-23 DIAGNOSIS — F17210 Nicotine dependence, cigarettes, uncomplicated: Secondary | ICD-10-CM | POA: Diagnosis present

## 2016-01-23 DIAGNOSIS — Z9981 Dependence on supplemental oxygen: Secondary | ICD-10-CM

## 2016-01-23 DIAGNOSIS — K810 Acute cholecystitis: Principal | ICD-10-CM

## 2016-01-23 DIAGNOSIS — M549 Dorsalgia, unspecified: Secondary | ICD-10-CM | POA: Diagnosis present

## 2016-01-23 DIAGNOSIS — R634 Abnormal weight loss: Secondary | ICD-10-CM | POA: Diagnosis present

## 2016-01-23 DIAGNOSIS — I2510 Atherosclerotic heart disease of native coronary artery without angina pectoris: Secondary | ICD-10-CM | POA: Diagnosis present

## 2016-01-23 DIAGNOSIS — I509 Heart failure, unspecified: Secondary | ICD-10-CM | POA: Diagnosis present

## 2016-01-23 DIAGNOSIS — Z96642 Presence of left artificial hip joint: Secondary | ICD-10-CM | POA: Diagnosis present

## 2016-01-23 DIAGNOSIS — J9621 Acute and chronic respiratory failure with hypoxia: Secondary | ICD-10-CM | POA: Diagnosis present

## 2016-01-23 DIAGNOSIS — R109 Unspecified abdominal pain: Secondary | ICD-10-CM | POA: Diagnosis not present

## 2016-01-23 DIAGNOSIS — F419 Anxiety disorder, unspecified: Secondary | ICD-10-CM | POA: Diagnosis present

## 2016-01-23 DIAGNOSIS — Z7952 Long term (current) use of systemic steroids: Secondary | ICD-10-CM | POA: Diagnosis not present

## 2016-01-23 DIAGNOSIS — R918 Other nonspecific abnormal finding of lung field: Secondary | ICD-10-CM | POA: Diagnosis not present

## 2016-01-23 DIAGNOSIS — R911 Solitary pulmonary nodule: Secondary | ICD-10-CM | POA: Diagnosis present

## 2016-01-23 DIAGNOSIS — G8929 Other chronic pain: Secondary | ICD-10-CM | POA: Diagnosis present

## 2016-01-23 DIAGNOSIS — Z66 Do not resuscitate: Secondary | ICD-10-CM | POA: Diagnosis present

## 2016-01-23 DIAGNOSIS — Z79899 Other long term (current) drug therapy: Secondary | ICD-10-CM

## 2016-01-23 DIAGNOSIS — R6510 Systemic inflammatory response syndrome (SIRS) of non-infectious origin without acute organ dysfunction: Secondary | ICD-10-CM | POA: Diagnosis present

## 2016-01-23 LAB — CBC
HCT: 40.6 % (ref 40.0–52.0)
Hemoglobin: 13.7 g/dL (ref 13.0–18.0)
MCH: 30.3 pg (ref 26.0–34.0)
MCHC: 33.9 g/dL (ref 32.0–36.0)
MCV: 89.5 fL (ref 80.0–100.0)
PLATELETS: 191 10*3/uL (ref 150–440)
RBC: 4.54 MIL/uL (ref 4.40–5.90)
RDW: 15.1 % — ABNORMAL HIGH (ref 11.5–14.5)
WBC: 6.3 10*3/uL (ref 3.8–10.6)

## 2016-01-23 LAB — BASIC METABOLIC PANEL
Anion gap: 7 (ref 5–15)
BUN: 7 mg/dL (ref 6–20)
CALCIUM: 9.6 mg/dL (ref 8.9–10.3)
CO2: 35 mmol/L — ABNORMAL HIGH (ref 22–32)
CREATININE: 0.85 mg/dL (ref 0.61–1.24)
Chloride: 95 mmol/L — ABNORMAL LOW (ref 101–111)
GFR calc non Af Amer: >60 mL/min (ref >60)
Glucose, Bld: 142 mg/dL — ABNORMAL HIGH (ref 65–99)
Potassium: 3.9 mmol/L (ref 3.5–5.1)
SODIUM: 137 mmol/L (ref 135–145)

## 2016-01-23 LAB — TROPONIN I: Troponin I: <0.03 ng/mL (ref <0.03)

## 2016-01-23 LAB — HEPATIC FUNCTION PANEL
ALT: 69 U/L — ABNORMAL HIGH (ref 17–63)
AST: 77 U/L — AB (ref 15–41)
Albumin: 3.8 g/dL (ref 3.5–5.0)
Alkaline Phosphatase: 75 U/L (ref 38–126)
BILIRUBIN DIRECT: 0.4 mg/dL (ref 0.1–0.5)
BILIRUBIN INDIRECT: 0.7 mg/dL (ref 0.3–0.9)
Total Bilirubin: 1.1 mg/dL (ref 0.3–1.2)
Total Protein: 7 g/dL (ref 6.5–8.1)

## 2016-01-23 LAB — INFLUENZA PANEL BY PCR (TYPE A & B)
INFLAPCR: NEGATIVE
INFLBPCR: NEGATIVE

## 2016-01-23 LAB — BRAIN NATRIURETIC PEPTIDE: B Natriuretic Peptide: 47 pg/mL (ref 0.0–100.0)

## 2016-01-23 LAB — LIPASE, BLOOD: LIPASE: 18 U/L (ref 11–51)

## 2016-01-23 MED ORDER — CIPROFLOXACIN IN D5W 400 MG/200ML IV SOLN
400.0000 mg | Freq: Once | INTRAVENOUS | Status: DC
Start: 2016-01-23 — End: 2016-01-23

## 2016-01-23 MED ORDER — SODIUM CHLORIDE 0.9 % IV BOLUS (SEPSIS)
500.0000 mL | Freq: Once | INTRAVENOUS | Status: AC
  Administered 2016-01-23: 500 mL via INTRAVENOUS

## 2016-01-23 MED ORDER — POTASSIUM CHLORIDE CRYS ER 20 MEQ PO TBCR
20.0000 meq | EXTENDED_RELEASE_TABLET | Freq: Two times a day (BID) | ORAL | Status: DC
  Administered 2016-01-23 – 2016-01-26 (×6): 20 meq via ORAL
  Filled 2016-01-23 (×6): qty 1

## 2016-01-23 MED ORDER — ALBUTEROL SULFATE (2.5 MG/3ML) 0.083% IN NEBU
10.0000 mg | INHALATION_SOLUTION | Freq: Once | RESPIRATORY_TRACT | Status: AC
  Administered 2016-01-23: 10 mg via RESPIRATORY_TRACT
  Filled 2016-01-23: qty 12

## 2016-01-23 MED ORDER — ROFLUMILAST 500 MCG PO TABS
500.0000 ug | ORAL_TABLET | Freq: Every day | ORAL | Status: DC
  Administered 2016-01-24 – 2016-01-26 (×3): 500 ug via ORAL
  Filled 2016-01-23 (×3): qty 1

## 2016-01-23 MED ORDER — OXYCODONE HCL 5 MG PO TABS
10.0000 mg | ORAL_TABLET | Freq: Four times a day (QID) | ORAL | Status: DC | PRN
  Administered 2016-01-24 – 2016-01-25 (×5): 10 mg via ORAL
  Filled 2016-01-23 (×6): qty 2

## 2016-01-23 MED ORDER — ONDANSETRON HCL 4 MG PO TABS: 4.0000 mg | ORAL_TABLET | Freq: Four times a day (QID) | ORAL | Status: DC | PRN

## 2016-01-23 MED ORDER — ENOXAPARIN SODIUM 40 MG/0.4ML ~~LOC~~ SOLN
40.0000 mg | SUBCUTANEOUS | Status: DC
  Filled 2016-01-23 (×3): qty 0.4

## 2016-01-23 MED ORDER — LEVOFLOXACIN 750 MG PO TABS
750.0000 mg | ORAL_TABLET | Freq: Once | ORAL | Status: AC
  Administered 2016-01-23: 750 mg via ORAL
  Filled 2016-01-23: qty 1

## 2016-01-23 MED ORDER — ACETAMINOPHEN 650 MG RE SUPP: 650.0000 mg | Freq: Four times a day (QID) | RECTAL | Status: DC | PRN

## 2016-01-23 MED ORDER — ONDANSETRON 4 MG PO TBDP: 4.0000 mg | ORAL_TABLET | Freq: Three times a day (TID) | ORAL | Status: DC | PRN

## 2016-01-23 MED ORDER — METHYLPREDNISOLONE SODIUM SUCC 125 MG IJ SOLR
125.0000 mg | Freq: Once | INTRAMUSCULAR | Status: AC
  Administered 2016-01-23: 125 mg via INTRAVENOUS
  Filled 2016-01-23: qty 2

## 2016-01-23 MED ORDER — SODIUM CHLORIDE 0.9 % IV SOLN
INTRAVENOUS | Status: DC
  Administered 2016-01-23 – 2016-01-24 (×2): via INTRAVENOUS

## 2016-01-23 MED ORDER — OXYMETAZOLINE HCL 0.05 % NA SOLN
1.0000 | Freq: Once | NASAL | Status: AC
  Administered 2016-01-23: 1 via NASAL
  Filled 2016-01-23: qty 15

## 2016-01-23 MED ORDER — FUROSEMIDE 40 MG PO TABS
40.0000 mg | ORAL_TABLET | Freq: Every day | ORAL | Status: DC
  Administered 2016-01-24 – 2016-01-26 (×3): 40 mg via ORAL
  Filled 2016-01-23 (×3): qty 1

## 2016-01-23 MED ORDER — SODIUM CHLORIDE 0.9 % IV SOLN: 3.0000 g | Freq: Four times a day (QID) | INTRAVENOUS | Status: DC

## 2016-01-23 MED ORDER — ONDANSETRON HCL 4 MG/2ML IJ SOLN: 4.0000 mg | Freq: Four times a day (QID) | INTRAMUSCULAR | Status: DC | PRN

## 2016-01-23 MED ORDER — ONDANSETRON HCL 4 MG/2ML IJ SOLN
4.0000 mg | Freq: Once | INTRAMUSCULAR | Status: AC
  Administered 2016-01-23: 4 mg via INTRAVENOUS
  Filled 2016-01-23: qty 2

## 2016-01-23 MED ORDER — SENNOSIDES-DOCUSATE SODIUM 8.6-50 MG PO TABS: 1.0000 | ORAL_TABLET | Freq: Every evening | ORAL | Status: DC | PRN

## 2016-01-23 MED ORDER — CLONAZEPAM 1 MG PO TABS
1.0000 mg | ORAL_TABLET | Freq: Two times a day (BID) | ORAL | Status: DC
  Administered 2016-01-23 – 2016-01-26 (×6): 1 mg via ORAL
  Filled 2016-01-23 (×6): qty 1

## 2016-01-23 MED ORDER — TRAMADOL HCL 50 MG PO TABS
50.0000 mg | ORAL_TABLET | Freq: Four times a day (QID) | ORAL | Status: DC | PRN
  Filled 2016-01-23: qty 1

## 2016-01-23 MED ORDER — NICOTINE 21 MG/24HR TD PT24
21.0000 mg | MEDICATED_PATCH | Freq: Every day | TRANSDERMAL | Status: DC
  Administered 2016-01-24 – 2016-01-25 (×2): 21 mg via TRANSDERMAL
  Filled 2016-01-23 (×2): qty 1

## 2016-01-23 MED ORDER — DULOXETINE HCL 60 MG PO CPEP
60.0000 mg | ORAL_CAPSULE | Freq: Every day | ORAL | Status: DC
  Administered 2016-01-24 – 2016-01-26 (×3): 60 mg via ORAL
  Filled 2016-01-23 (×3): qty 1

## 2016-01-23 MED ORDER — IPRATROPIUM-ALBUTEROL 0.5-2.5 (3) MG/3ML IN SOLN
3.0000 mL | Freq: Once | RESPIRATORY_TRACT | Status: AC
  Administered 2016-01-23: 3 mL via RESPIRATORY_TRACT
  Filled 2016-01-23: qty 3

## 2016-01-23 MED ORDER — MIRTAZAPINE 15 MG PO TABS
15.0000 mg | ORAL_TABLET | Freq: Every day | ORAL | Status: DC
  Administered 2016-01-23 – 2016-01-25 (×3): 15 mg via ORAL
  Filled 2016-01-23 (×3): qty 1

## 2016-01-23 MED ORDER — SENNA 8.6 MG PO TABS: 2.0000 | ORAL_TABLET | Freq: Every day | ORAL | Status: DC | PRN

## 2016-01-23 MED ORDER — SODIUM CHLORIDE 0.9 % IV SOLN
3.0000 g | Freq: Four times a day (QID) | INTRAVENOUS | Status: DC
  Administered 2016-01-23 – 2016-01-25 (×7): 3 g via INTRAVENOUS
  Filled 2016-01-23 (×11): qty 3

## 2016-01-23 MED ORDER — MORPHINE SULFATE (PF) 4 MG/ML IV SOLN
4.0000 mg | Freq: Once | INTRAVENOUS | Status: AC
  Administered 2016-01-23: 4 mg via INTRAVENOUS
  Filled 2016-01-23: qty 1

## 2016-01-23 MED ORDER — DILTIAZEM HCL ER COATED BEADS 120 MG PO CP24
240.0000 mg | ORAL_CAPSULE | Freq: Every day | ORAL | Status: DC
  Administered 2016-01-24 – 2016-01-26 (×3): 240 mg via ORAL
  Filled 2016-01-23 (×3): qty 2

## 2016-01-23 MED ORDER — METHYLPREDNISOLONE SODIUM SUCC 125 MG IJ SOLR
60.0000 mg | INTRAMUSCULAR | Status: DC
  Administered 2016-01-24 – 2016-01-25 (×2): 60 mg via INTRAVENOUS
  Filled 2016-01-23 (×2): qty 2

## 2016-01-23 MED ORDER — OXYCODONE HCL ER 10 MG PO T12A
20.0000 mg | EXTENDED_RELEASE_TABLET | Freq: Two times a day (BID) | ORAL | Status: DC
  Administered 2016-01-23 – 2016-01-26 (×6): 20 mg via ORAL
  Filled 2016-01-23 (×6): qty 2

## 2016-01-23 MED ORDER — MAGNESIUM OXIDE 400 (241.3 MG) MG PO TABS
400.0000 mg | ORAL_TABLET | Freq: Every day | ORAL | Status: DC
  Administered 2016-01-24 – 2016-01-26 (×3): 400 mg via ORAL
  Filled 2016-01-23 (×3): qty 1

## 2016-01-23 MED ORDER — IOPAMIDOL (ISOVUE-370) INJECTION 76%
75.0000 mL | Freq: Once | INTRAVENOUS | Status: AC | PRN
  Administered 2016-01-23: 75 mL via INTRAVENOUS

## 2016-01-23 MED ORDER — ORAL CARE MOUTH RINSE
15.0000 mL | Freq: Two times a day (BID) | OROMUCOSAL | Status: DC
  Administered 2016-01-24: 15 mL via OROMUCOSAL

## 2016-01-23 MED ORDER — BUPROPION HCL ER (SR) 100 MG PO TB12
100.0000 mg | ORAL_TABLET | ORAL | Status: DC
  Administered 2016-01-24 – 2016-01-26 (×3): 100 mg via ORAL
  Filled 2016-01-23 (×3): qty 1

## 2016-01-23 MED ORDER — IPRATROPIUM-ALBUTEROL 0.5-2.5 (3) MG/3ML IN SOLN
3.0000 mL | Freq: Once | RESPIRATORY_TRACT | Status: AC
Start: 2016-01-23 — End: 2016-01-23
  Administered 2016-01-23: 3 mL via RESPIRATORY_TRACT
  Filled 2016-01-23: qty 3

## 2016-01-23 MED ORDER — ACETAMINOPHEN 325 MG PO TABS: 650.0000 mg | ORAL_TABLET | Freq: Four times a day (QID) | ORAL | Status: DC | PRN

## 2016-01-23 MED ORDER — POLYETHYLENE GLYCOL 3350 17 G PO PACK
17.0000 g | PACK | Freq: Every day | ORAL | Status: DC
  Administered 2016-01-26: 17 g via ORAL
  Filled 2016-01-23 (×2): qty 1

## 2016-01-23 MED ORDER — MOMETASONE FURO-FORMOTEROL FUM 100-5 MCG/ACT IN AERO
2.0000 | INHALATION_SPRAY | Freq: Two times a day (BID) | RESPIRATORY_TRACT | Status: DC
  Administered 2016-01-24 – 2016-01-26 (×4): 2 via RESPIRATORY_TRACT
  Filled 2016-01-23: qty 8.8

## 2016-01-23 MED ORDER — IPRATROPIUM-ALBUTEROL 0.5-2.5 (3) MG/3ML IN SOLN: 3.0000 mL | RESPIRATORY_TRACT | Status: DC | PRN

## 2016-01-23 MED ORDER — METFORMIN HCL ER 500 MG PO TB24
1000.0000 mg | ORAL_TABLET | Freq: Two times a day (BID) | ORAL | Status: DC
  Administered 2016-01-24 – 2016-01-26 (×5): 1000 mg via ORAL
  Filled 2016-01-23 (×5): qty 2

## 2016-01-23 NOTE — ED Notes (Signed)
Pt resting in bed at this time. NAD Noted. Respirations even and unlabored.

## 2016-01-23 NOTE — ED Triage Notes (Signed)
Pt comes into the ED via EMS from [place] living with c/o increased SOB for the past week with pt from "my stomach all the way up to my sinuses" pt is on 5L [place]  Continuous .Marland Kitchen

## 2016-01-23 NOTE — ED Notes (Signed)
Ems pt to the lobby , on 5 liters O2 chronically  , SHOB , from a local group home , with [facility] , increased after having a cigarette ,  CBG 120, Sats 95% , had x1 breathing at home  , EMS administered x1 breathing tx PTA

## 2016-01-23 NOTE — Consult Note (Signed)
Date of Consultation:  01/23/2016  Requesting Physician:  Fritzi Mandes, MD  Reason for Consultation:  Acalculous Cholecystitis  History of Present Illness: Craig Wright is a 50 y.o. male who presents with a three-day history of worsening shortness of breath with productive cough and epigastric to right upper quadrant abdominal pain. Patient has a history of significant COPD with 5 L of nasal cannula at home on baseline. He is being admitted to the medical team for COPD exacerbation but on the patient's workup he was also noted to have a thickened gallbladder on his CT scan of the chest. Follow-up ultrasound revealed a diffuse gallbladder wall thickening with no cholelithiasis.  Patient denies having any fevers but feeling chills. Denies any chest pain but has been having worsening shortness of breath over the last few days with his cough. Denies any nausea or vomiting. Does endorse abdominal pain in the epigastric area. Denies any constipation or diarrhea. Denies any dysuria or hematuria. Of note patient is DNR/DNI   Past Medical History: Past Medical History:  Diagnosis Date  . A-fib ([place])   . Anxiety disorder   . Asthma   . CHF (congestive heart failure) ([place])   . COPD (chronic obstructive pulmonary disease) ([place])   . Coronary artery disease   . Diabetes mellitus without complication ([place])   . Hypertension      Past Surgical History: Past Surgical History:  Procedure Laterality Date  . KNEE SURGERY Left   . ORTHOPEDIC SURGERY     multiple  . SKIN GRAFT    . TOTAL HIP ARTHROPLASTY Left     Home Medications: Prior to Admission medications   Medication Sig Start Date End Date Taking? Authorizing Provider  acetaminophen (TYLENOL) 325 MG tablet Take 650 mg by mouth every 4 (four) hours as needed.    Historical Provider, MD  albuterol (PROVENTIL) (2.5 MG/3ML) 0.083% nebulizer solution Take 2.5 mg by nebulization every 2 (two) hours as needed for wheezing or shortness of  breath.    Historical Provider, MD  albuterol-ipratropium (COMBIVENT) 18-103 MCG/ACT inhaler Inhale 2 puffs into the lungs every 4 (four) hours as needed for wheezing or shortness of breath. 12/12/15   Nicholes Mango, MD  buPROPion (WELLBUTRIN SR) 100 MG 12 hr tablet Take 100 mg by mouth every morning.    Historical Provider, MD  diltiazem (CARDIZEM CD) 240 MG 24 hr capsule Take 1 capsule (240 mg total) by mouth daily. 12/13/15   Nicholes Mango, MD  DULoxetine (CYMBALTA) 60 MG capsule Take 120 mg by mouth daily.     Historical Provider, MD  furosemide (LASIX) 40 MG tablet Take 40 mg by mouth daily.     Historical Provider, MD  metFORMIN (GLUMETZA) 500 MG (MOD) 24 hr tablet Take 2 tablets (1,000 mg total) by mouth 2 (two) times daily. Reported on 06/26/2015 10/27/15   Dustin Flock, MD  mirtazapine (REMERON) 45 MG tablet Take 45 mg by mouth at bedtime.    Historical Provider, MD  mometasone-formoterol (DULERA) 100-5 MCG/ACT AERO Inhale 2 puffs into the lungs 2 (two) times daily. 06/28/15   Fritzi Mandes, MD  nicotine (NICODERM CQ - DOSED IN MG/24 HOURS) 21 mg/24hr patch Place 1 patch (21 mg total) onto the skin daily. 12/13/15   Nicholes Mango, MD  oxyCODONE (OXYCONTIN) 20 mg 12 hr tablet Take 20 mg by mouth every 12 (twelve) hours.    Historical Provider, MD  Oxycodone HCl 10 MG TABS Take 10 mg by mouth every 6 (six) hours as  needed.    Historical Provider, MD  polyethylene glycol (MIRALAX / GLYCOLAX) packet Take 17 g by mouth daily.    Historical Provider, MD  predniSONE (DELTASONE) 20 MG tablet Take 3 tablets (60 mg total) by mouth daily. 12/29/15   Loney Hering, MD  roflumilast (DALIRESP) 500 MCG TABS tablet Take 500 mcg by mouth daily.    Historical Provider, MD  senna (SENNA-LAX) 8.6 MG tablet Take 2 tablets by mouth daily as needed for constipation.    Historical Provider, MD    Allergies: No Known Allergies  Social History:  reports that he has been smoking Cigarettes.  He has a 40.00 pack-year  smoking history. He has never used smokeless tobacco. He reports that he does not drink alcohol or use drugs.   Family History: Family History  Problem Relation Age of Onset  . Cervical cancer Mother   . Colon cancer Father     Review of Systems: Review of Systems  Constitutional: Positive for chills. Negative for fever.  HENT: Negative for hearing loss.   Eyes: Negative for blurred vision.  Respiratory: Positive for cough, sputum production and shortness of breath.   Cardiovascular: Positive for leg swelling. Negative for chest pain.  Gastrointestinal: Positive for abdominal pain. Negative for constipation, diarrhea, heartburn, nausea and vomiting.  Genitourinary: Negative for dysuria and hematuria.  Musculoskeletal: Negative for myalgias.  Skin: Negative for rash.  Neurological: Negative for dizziness.  Psychiatric/Behavioral: Negative for depression.  All other systems reviewed and are negative.   Physical Exam BP 118/83   Pulse 100   Temp 97.9 F (36.6 C) (Oral)   Resp (!) 21   Ht 6' (1.829 m)   Wt 84.8 kg (187 lb)   SpO2 96%   BMI 25.36 kg/m  CONSTITUTIONAL: No acute distress HEENT:  Normocephalic, atraumatic, extraocular motion intact. NECK: Trachea is midline, and there is no jugular venous distension. RESPIRATORY:  Lungs are clear, and breath sounds are equal bilaterally. Nasal cannula at 5 L CARDIOVASCULAR: Heart is regular without murmurs, gallops, or rubs. GI: The abdomen is soft, nondistended, mildly tender to palpation in the epigastric area. Negative Murphy sign. MUSCULOSKELETAL:  Patient has bilateral lower extremity edema, pitting. SKIN: Skin turgor is normal. There are no pathologic skin lesions. He has a left upper extremity skin graft scar. NEUROLOGIC:  Motor and sensation is grossly normal.  Cranial nerves are grossly intact. PSYCH:  Alert and oriented to person, place and time. Affect is normal.  Laboratory Analysis: Results for orders placed or  performed during the hospital encounter of 01/23/16 (from the past 24 hour(s))  Basic metabolic panel     Status: Abnormal   Collection Time: 01/23/16  1:13 PM  Result Value Ref Range   Sodium 137 135 - 145 mmol/L   Potassium 3.9 3.5 - 5.1 mmol/L   Chloride 95 (L) 101 - 111 mmol/L   CO2 35 (H) 22 - 32 mmol/L   Glucose, Bld 142 (H) 65 - 99 mg/dL   BUN 7 6 - 20 mg/dL   Creatinine, Ser 0.85 0.61 - 1.24 mg/dL   Calcium 9.6 8.9 - 10.3 mg/dL   GFR calc non Af Amer >60 >60 mL/min   GFR calc Af Amer >60 >60 mL/min   Anion gap 7 5 - 15  CBC     Status: Abnormal   Collection Time: 01/23/16  1:13 PM  Result Value Ref Range   WBC 6.3 3.8 - 10.6 K/uL   RBC 4.54 4.40 - 5.90  MIL/uL   Hemoglobin 13.7 13.0 - 18.0 g/dL   HCT 40.6 40.0 - 52.0 %   MCV 89.5 80.0 - 100.0 fL   MCH 30.3 26.0 - 34.0 pg   MCHC 33.9 32.0 - 36.0 g/dL   RDW 15.1 (H) 11.5 - 14.5 %   Platelets 191 150 - 440 K/uL  Troponin I     Status: None   Collection Time: 01/23/16  1:13 PM  Result Value Ref Range   Troponin I <0.03 <0.03 ng/mL  Brain natriuretic peptide     Status: None   Collection Time: 01/23/16  1:13 PM  Result Value Ref Range   B Natriuretic Peptide 47.0 0.0 - 100.0 pg/mL  Hepatic function panel     Status: Abnormal   Collection Time: 01/23/16  1:13 PM  Result Value Ref Range   Total Protein 7.0 6.5 - 8.1 g/dL   Albumin 3.8 3.5 - 5.0 g/dL   AST 77 (H) 15 - 41 U/L   ALT 69 (H) 17 - 63 U/L   Alkaline Phosphatase 75 38 - 126 U/L   Total Bilirubin 1.1 0.3 - 1.2 mg/dL   Bilirubin, Direct 0.4 0.1 - 0.5 mg/dL   Indirect Bilirubin 0.7 0.3 - 0.9 mg/dL  Lipase, blood     Status: None   Collection Time: 01/23/16  1:13 PM  Result Value Ref Range   Lipase 18 11 - 51 U/L  Influenza panel by PCR (type A & B, H1N1)     Status: None   Collection Time: 01/23/16  1:46 PM  Result Value Ref Range   Influenza A By PCR NEGATIVE NEGATIVE   Influenza B By PCR NEGATIVE NEGATIVE  Troponin I     Status: None   Collection Time:  01/23/16  4:27 PM  Result Value Ref Range   Troponin I <0.03 <0.03 ng/mL    Imaging: Dg Chest 2 View  Result Date: 01/23/2016 CLINICAL DATA:  Short of breath EXAM: CHEST  2 VIEW COMPARISON:  12/29/2015 FINDINGS: COPD with pulmonary hyperinflation. Cardiac enlargement without heart failure. Negative for infiltrate effusion or mass. IMPRESSION: No active cardiopulmonary disease. Electronically Signed   By: Franchot Gallo M.D.   On: 01/23/2016 14:34   Ct Angio Chest Pe W And/or Wo Contrast  Result Date: 01/23/2016 CLINICAL DATA:  Increased shortness of breath for 1 week, tachycardia, asymmetric leg swelling, history of COPD, hypertension, coronary artery disease, atrial fibrillation, CHF, smoker EXAM: CT ANGIOGRAPHY CHEST WITH CONTRAST TECHNIQUE: Multidetector CT imaging of the chest was performed using the standard protocol during bolus administration of intravenous contrast. Multiplanar CT image reconstructions and MIPs were obtained to evaluate the vascular anatomy. CONTRAST:  75 cc Isovue 370 IV COMPARISON:  06/07/2015 FINDINGS: Cardiovascular: Aneurysmal dilatation of the ascending thoracic aorta 4.0 cm diameter image 49. No evidence of aortic dissection. Pulmonary arteries well opacified and patent. No evidence of pulmonary embolism. Minimal pericardial effusion. Mediastinum/Nodes: Question mild diffuse wall thickening of the thoracic esophagus versus artifact from underdistention. Few normal size mediastinal and hilar nodes with a borderline enlarged RIGHT hilar node 10 mm short axis image 52. Base of cervical region unremarkable. Small subcutaneous nodule anterior chest wall LEFT of midline at the level of the aortic arch 17 x 14 mm image 35 not significantly. Lungs/Pleura: Spiculated mass RIGHT lower lobe 2.7 x 1.9 x 2.0 cm containing small focus of central cavitation highly suspicious for a pulmonary neoplasm. This represents interval increase in size since the prior CT when this measured  2.0 cm  in greatest size and was thin/air-filled. Question 4 mm scar LEFT lower lobe image 54. Minimal atelectasis at base of lingula. Remaining lungs clear. No infiltrate, pleural effusion or pneumothorax. Upper Abdomen: Questionable gallbladder wall thickening without calcification. No biliary dilatation. Stomach decompressed, with suboptimal assessment of gastric wall thickness. Musculoskeletal: No definite acute osseous findings. Review of the MIP images confirms the above findings. IMPRESSION: No evidence pulmonary embolism. 2.7 x 1.9 x 2.0 cm diameter spiculated mass in RIGHT lower lobe with minimal central cavitation highly suspicious for a primary pulmonary neoplasm. Tissue diagnosis recommended. Single borderline enlarged RIGHT hilar lymph node 10 mm short axis. Questionable gallbladder wall thickening, unable to exclude acute cholecystitis with this appearance ; recommend correlation with LFTs and if clinically indicated can assess by RIGHT upper quadrant sonography. 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 Recommend annual imaging followup by CTA or MRA. This recommendation follows 2010 ACCF/AHA/AATS/ACR/ASA/SCA/SCAI/SIR/STS/SVM Guidelines for the Diagnosis and Management of Patients with Thoracic Aortic Disease. Circulation. 2010; 121: V409-W119 Electronically Signed   By: Lavonia Dana M.D.   On: 01/23/2016 15:39   US Abdomen Limited Ruq  Result Date: 01/23/2016 CLINICAL DATA:  Abdominal pain EXAM: US ABDOMEN LIMITED - RIGHT UPPER QUADRANT COMPARISON:  12/29/2015 FINDINGS: Gallbladder: The gallbladder wall is thickened measuring 6.6 mm in maximum thickness. No gallstones. Negative sonographic Murphy's sign. Common bile duct: Diameter: 5 mm Liver: No focal lesion identified. Within normal limits in parenchymal echogenicity. IMPRESSION: 1. Diffuse gallbladder wall thickening. The appearance is nonspecific but may reflect 8 calculus  cholecystitis. If further imaging is clinically indicated at this time a hepatobiliary scan may be helpful to assess patency of the cystic duct. Electronically Signed   By: Kerby Moors M.D.   On: 01/23/2016 17:16    Assessment and Plan: This is a 50 y.o. male who presents with COPD exacerbation and possible acalculous cholecystitis. Patient is being admitted to the medical team for management given his severe COPD. From a surgical standpoint recommend obtaining a HIDA scan in order to assess for any cystic duct obstruction. If that were the case he would require a percutaneous cholecystostomy tube. Otherwise continue with nothing by mouth with IV fluid hydration and IV antibiotics. No acute surgical needs at this point but will continue following along with you.   Melvyn Neth, [place]

## 2016-01-23 NOTE — ED Notes (Signed)
Patient taken to CT.

## 2016-01-23 NOTE — ED Provider Notes (Signed)
[facility] Emergency Department Provider Note  ____________________________________________  Time seen: Approximately 1:29 PM  I have reviewed the triage vital signs and the nursing notes.   HISTORY  Chief Complaint Shortness of Breath   HPI Craig Wright is a 50 y.o. 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 Patient endorses 3 days of worsening shortness of breath, cough productive of clear sputum, chills, nausea. No vomiting or diarrhea, no rash. Patient is also complaining of chest tightness that improves with his breathing treatments. Currently mild chest tightness that is substernal and epigastric, constant and nonradiating. Patient continues to smoke. He endorses compliance with his medications. He received one breathing treatment per EMS and one in the waiting room with minimal improvement of his shortness of breath. Patient also complaining of b/l LE swelling R> L which according to patient is his baseline. No personal or fh or PE/DVT however patient is very sedentary. Patient is also complaining of epigastric abdominal pain which is chronic for him, sharp, 8/10. Vaccines including flu uptodate.  Past Medical History:  Diagnosis Date  . A-fib ([place])   . Anxiety disorder   . Asthma   . CHF (congestive heart failure) ([place])   . COPD (chronic obstructive pulmonary disease) ([place])   . Coronary artery disease   . Diabetes mellitus without complication ([place])   . Hypertension     Patient Active Problem List   Diagnosis Date Noted  . COPD exacerbation ([place]) 10/24/2015  . Chronic respiratory failure with hypoxia ([place])   . Palliative care by specialist   . DNR (do not resuscitate)   . Weakness generalized   . Acute on chronic respiratory failure with hypoxia ([place]) 10/15/2015  . Dyspnea  06/07/2015    Past Surgical History:  Procedure Laterality Date  . KNEE SURGERY Left   . ORTHOPEDIC SURGERY     multiple  . SKIN GRAFT    . TOTAL HIP ARTHROPLASTY Left     Prior to Admission medications   Medication Sig Start Date End Date Taking? Authorizing Provider  acetaminophen (TYLENOL) 325 MG tablet Take 650 mg by mouth every 4 (four) hours as needed.    Historical Provider, MD  albuterol (PROVENTIL) (2.5 MG/3ML) 0.083% nebulizer solution Take 2.5 mg by nebulization every 2 (two) hours as needed for wheezing or shortness of breath.    Historical Provider, MD  albuterol-ipratropium (COMBIVENT) 18-103 MCG/ACT inhaler Inhale 2 puffs into the lungs every 4 (four) hours as needed for wheezing or shortness of breath. 12/12/15   Nicholes Mango, MD  buPROPion (WELLBUTRIN SR) 100 MG 12 hr tablet Take 100 mg by mouth every morning.    Historical Provider, MD  diltiazem (CARDIZEM CD) 240 MG 24 hr capsule Take 1 capsule (240 mg total) by mouth daily. 12/13/15   Nicholes Mango, MD  DULoxetine (CYMBALTA) 60 MG capsule Take 120 mg by mouth daily.     Historical Provider, MD  furosemide (LASIX) 40 MG tablet Take 40 mg by mouth daily.     Historical Provider, MD  metFORMIN (GLUMETZA) 500 MG (MOD) 24 hr tablet Take 2 tablets (1,000 mg total) by mouth 2 (two) times daily. Reported on 06/26/2015 10/27/15   Dustin Flock, MD  mirtazapine (REMERON) 45 MG tablet Take 45 mg by mouth at bedtime.    Historical Provider, MD  mometasone-formoterol (DULERA) 100-5 MCG/ACT AERO  Inhale 2 puffs into the lungs 2 (two) times daily. 06/28/15   Fritzi Mandes, MD  nicotine (NICODERM CQ - DOSED IN MG/24 HOURS) 21 mg/24hr patch Place 1 patch (21 mg total) onto the skin daily. 12/13/15   Nicholes Mango, MD  oxyCODONE (OXYCONTIN) 20 mg 12 hr tablet Take 20 mg by mouth every 12 (twelve) hours.    Historical Provider, MD  Oxycodone HCl 10 MG TABS Take 10 mg by mouth every 6 (six) hours as needed.    Historical Provider, MD  polyethylene  glycol (MIRALAX / GLYCOLAX) packet Take 17 g by mouth daily.    Historical Provider, MD  predniSONE (DELTASONE) 20 MG tablet Take 3 tablets (60 mg total) by mouth daily. 12/29/15   Loney Hering, MD  risperiDONE (RISPERDAL) 2 MG tablet Take 2 mg by mouth at bedtime.    Historical Provider, MD  roflumilast (DALIRESP) 500 MCG TABS tablet Take 500 mcg by mouth daily.    Historical Provider, MD  senna (SENNA-LAX) 8.6 MG tablet Take 2 tablets by mouth daily as needed for constipation.    Historical Provider, MD  tiotropium (SPIRIVA) 18 MCG inhalation capsule Place 18 mcg into inhaler and inhale daily.    Historical Provider, MD    Allergies Patient has no known allergies.  Family History  Problem Relation Age of Onset  . Cervical cancer Mother   . Colon cancer Father     Social History Social History  Substance Use Topics  . Smoking status: Current Some Day Smoker    Packs/day: 1.00    Years: 40.00    Types: Cigarettes  . Smokeless tobacco: Never Used  . Alcohol use No    Review of Systems  Constitutional: Negative for fever. Eyes: Negative for visual changes. ENT: Negative for sore throat. Neck: No neck pain  Cardiovascular: + chest tightness Respiratory: + shortness of breath and cough Gastrointestinal: Negative for abdominal pain, vomiting or diarrhea. Genitourinary: Negative for dysuria. Musculoskeletal: Negative for back pain. + b/l LE swelling Skin: Negative for rash. Neurological: Negative for headaches, weakness or numbness. Psych: No SI or HI  ____________________________________________   PHYSICAL EXAM:  VITAL SIGNS: ED Triage Vitals  Enc Vitals Group     BP 01/23/16 1310 139/88     Pulse Rate 01/23/16 1310 (!) 126     Resp 01/23/16 1310 (!) 22     Temp 01/23/16 1310 97.9 F (36.6 C)     Temp Source 01/23/16 1310 Oral     SpO2 01/23/16 1310 98 %     Weight 01/23/16 1310 187 lb (84.8 kg)     Height 01/23/16 1310 6' (1.829 m)     Head Circumference --       Peak Flow --      Pain Score 01/23/16 1311 7     Pain Loc --      Pain Edu? --      Excl. in [place]? --     Constitutional: Alert and oriented. Well appearing and in no apparent distress. HEENT:      Head: Normocephalic and atraumatic.         Eyes: Conjunctivae are normal. Sclera is non-icteric. EOMI. PERRL      Mouth/Throat: Mucous membranes are moist.       Neck: Supple with no signs of meningismus. Cardiovascular: Tachycardic with regular rhythm. No murmurs, gallops, or rubs. 2+ symmetrical distal pulses are present in all extremities. No JVD. Respiratory: Tachypneic, sating 98% on baseline 5L [place], severely  diminished air movement bilaterally, no crackles or wheezing appreciated. Gastrointestinal: Soft, non tender, and non distended with positive bowel sounds. No rebound or guarding. Genitourinary: No CVA tenderness. Musculoskeletal: b/l pitting edema R>L Neurologic: Normal speech and language. Face is symmetric. Moving all extremities. No gross focal neurologic deficits are appreciated. Skin: Skin is warm, dry and intact. No rash noted. Psychiatric: Mood and affect are normal. Speech and behavior are normal.  ____________________________________________   LABS (all labs ordered are listed, but only abnormal results are displayed)  Labs Reviewed  BASIC METABOLIC PANEL - Abnormal; Notable for the following:       Result Value   Chloride 95 (*)    CO2 35 (*)    Glucose, Bld 142 (*)    All other components within normal limits  CBC - Abnormal; Notable for the following:    RDW 15.1 (*)    All other components within normal limits  HEPATIC FUNCTION PANEL - Abnormal; Notable for the following:    AST 77 (*)    ALT 69 (*)    All other components within normal limits  TROPONIN I  BRAIN NATRIURETIC PEPTIDE  INFLUENZA PANEL BY PCR (TYPE A & B, H1N1)  TROPONIN I  LIPASE, BLOOD   ____________________________________________  EKG  ED ECG REPORT I, Rudene Re, the  attending physician, personally viewed and interpreted this ECG.  Sinus tachycardia, rate of 128, normal intervals, right axis deviation, no ST elevations or depressions. Unchanged from baseline ____________________________________________  RADIOLOGY  CTA chest: No evidence pulmonary embolism.  2.7 x 1.9 x 2.0 cm diameter spiculated mass in RIGHT lower lobe with minimal central cavitation highly suspicious for a primary pulmonary neoplasm.  Tissue diagnosis recommended.  Single borderline enlarged RIGHT hilar lymph node 10 mm short axis.  Questionable gallbladder wall thickening, unable to exclude acute cholecystitis with this appearance ; recommend correlation with LFTs and if clinically indicated can assess by RIGHT upper quadrant sonography.  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 Recommend annual imaging followup by CTA or MRA. This recommendation follows 2010 ACCF/AHA/AATS/ACR/ASA/SCA/SCAI/SIR/STS/SVM Guidelines for the Diagnosis and Management of Patients with Thoracic Aortic Disease. Circulation. 2010; 121: L798-X211   RUQ [place]: . Diffuse gallbladder wall thickening. The appearance is nonspecific but may reflect 8 calculus cholecystitis. If further imaging is clinically indicated at this time a hepatobiliary scan may be helpful to assess patency of the cystic duct. ____________________________________________   PROCEDURES  Procedure(s) performed: None Procedures Critical Care performed: yes  CRITICAL CARE Performed by: Rudene Re  ?  Total critical care time: 45 min  Critical care time was exclusive of separately billable procedures and treating other patients.  Critical care was necessary to treat or prevent imminent or life-threatening deterioration.  Critical care was time spent personally by me on the following activities: development of treatment plan with patient and/or  surrogate as well as nursing, discussions with consultants, evaluation of patient's response to treatment, examination of patient, obtaining history from patient or surrogate, ordering and performing treatments and interventions, ordering and review of laboratory studies, ordering and review of radiographic studies, pulse oximetry and re-evaluation of patient's condition.  ____________________________________________   INITIAL IMPRESSION / ASSESSMENT AND PLAN / ED COURSE   50 y.o. 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 Biaxin is including flu are up to date. Patient has mildly increased work of breathing with respiratory rate of 22, satting well on 5 L nasal cannula which is his baseline, patient has severely diminished air movement bilaterally with no crackles or wheezing at this time, patient is in sinus tachycardia and has asymmetric lower extremity edema concerning for possible pulmonary embolism. We'll give duonebs, Solu-Medrol, CXR, flu swab, CTA of the chest. Will give gentle hydration.  Clinical Course as of Jan 22 1814  Thu Jan 23, 2016  1451 Patient moving better air and now with some expiratory wheezes. We'll give another breathing treatment. Kidney function is within normal limits. We'll do CT of the chest to rule out a PE. I have discussed risks and benefits of Levaquin versus a Z-Pak with patient who understands the risk of tendinitis and tendon rupture and has opted for Levaquin.  [CV]  2094 CT showed a spiculated right lung mass concerning for malignancy. Also showed concern for possible cholecystitis. Patient underwent a right upper quadrant ultrasound that is concerning for acalculous cholecystitis. I discussed with Dr. Hampton Abbot, surgery who recommended admission for IV abx, HIDA scan  in the am and if positive patient will need perc chole tube for drainage of the GB. Patient was informed of all these findings. Patient already received levaquin, will add augmentin,  [CV]    Clinical Course User Index [CV] Rudene Re, MD    Pertinent labs & imaging results that were available during my care of the patient were reviewed by me and considered in my medical decision making (see chart for details).    ____________________________________________   FINAL CLINICAL IMPRESSION(S) / ED DIAGNOSES  Final diagnoses:  Abdominal pain  COPD exacerbation (HCC)  Acute acalculous cholecystitis  Lung mass      NEW MEDICATIONS STARTED DURING THIS VISIT:  New Prescriptions   No medications on file     Note:  This document was prepared using Dragon voice recognition software and may include unintentional dictation errors.    Rudene Re, MD 01/23/16 1816

## 2016-01-23 NOTE — ED Notes (Signed)
Report given to [place], Therapist, sports.

## 2016-01-23 NOTE — H&P (Addendum)
Craig Wright    MR#:  [id number]  DATE OF BIRTH:  June 06, 1965  DATE OF ADMISSION:  01/23/2016  PRIMARY CARE PHYSICIAN: Pcp Not In System   REQUESTING/REFERRING PHYSICIAN: Dr. Alfred Levins  CHIEF COMPLAINT:   Increasing shortness of breath and right upper quadrant abdominal pain HISTORY OF PRESENT ILLNESS:  Craig Wright  is a 50 y.o. male with a known history of Atrial fibrillation, COPD on 5 L of nasal Oxygen with ongoing tobacco abuse, history of CHF, CAD, diabetes and hypertension comes to the emergency room from [facility] group home with complaints of increasing shortness of breath and wheezing. Patient was found to have sats down in the 80s. he was given several nebulizer treatments. Currently sats are 90-94 on 5 L nasal cannula. Patient also had right upper quadrant abdominal pain and epigastric pain. Workup in the emergency room showed CT of the abdomen showing spiculated mass in the right lower lobe and gallbladder wall thickening. Further testing included ultrasound of the abdomen which shows acalculous cholecystitis. Patient has elevated transaminases. His bilirubin is normal. He received IV Levaquin for his respiratory symptoms and Unasyn for his abdominal symptoms. He is a no code DO NOT RESUSCITATE carries a yellow dark facility DO NOT RESUSCITATE form no family members present. He is being admitted for further evaluation and management.  PAST MEDICAL HISTORY:   Past Medical History:  Diagnosis Date  . A-fib ([place])   . Anxiety disorder   . Asthma   . CHF (congestive heart failure) ([place])   . COPD (chronic obstructive pulmonary disease) ([place])   . Coronary artery disease   . Diabetes mellitus without complication ([place])   . Hypertension     PAST SURGICAL HISTOIRY:   Past Surgical History:  Procedure Laterality Date  . KNEE SURGERY Left   . ORTHOPEDIC SURGERY     multiple  . SKIN GRAFT     . TOTAL HIP ARTHROPLASTY Left     SOCIAL HISTORY:   Social History  Substance Use Topics  . Smoking status: Current Some Day Smoker    Packs/day: 1.00    Years: 40.00    Types: Cigarettes  . Smokeless tobacco: Never Used  . Alcohol use No    FAMILY HISTORY:   Family History  Problem Relation Age of Onset  . Cervical cancer Mother   . Colon cancer Father     DRUG ALLERGIES:  No Known Allergies  REVIEW OF SYSTEMS:  Review of Systems  Constitutional: Negative for chills, fever and weight loss.  HENT: Negative for ear discharge, ear pain and nosebleeds.   Eyes: Negative for blurred vision, pain and discharge.  Respiratory: Positive for shortness of breath. Negative for sputum production, wheezing and stridor.   Cardiovascular: Negative for chest pain, palpitations, orthopnea and PND.  Gastrointestinal: Positive for abdominal pain and nausea. Negative for diarrhea and vomiting.  Genitourinary: Negative for frequency and urgency.  Musculoskeletal: Negative for back pain and joint pain.  Neurological: Positive for weakness. Negative for sensory change, speech change and focal weakness.  Psychiatric/Behavioral: Negative for depression and hallucinations. The patient is not nervous/anxious.      MEDICATIONS AT HOME:   Prior to Admission medications   Medication Sig Start Date End Date Taking? Authorizing Provider  acetaminophen (TYLENOL) 325 MG tablet Take 650 mg by mouth every 4 (four) hours as needed.    Historical Provider, MD  albuterol (PROVENTIL) (2.5 MG/3ML) 0.083% nebulizer  solution Take 2.5 mg by nebulization every 2 (two) hours as needed for wheezing or shortness of breath.    Historical Provider, MD  albuterol-ipratropium (COMBIVENT) 18-103 MCG/ACT inhaler Inhale 2 puffs into the lungs every 4 (four) hours as needed for wheezing or shortness of breath. 12/12/15   Nicholes Mango, MD  buPROPion (WELLBUTRIN SR) 100 MG 12 hr tablet Take 100 mg by mouth every morning.     Historical Provider, MD  diltiazem (CARDIZEM CD) 240 MG 24 hr capsule Take 1 capsule (240 mg total) by mouth daily. 12/13/15   Nicholes Mango, MD  DULoxetine (CYMBALTA) 60 MG capsule Take 120 mg by mouth daily.     Historical Provider, MD  furosemide (LASIX) 40 MG tablet Take 40 mg by mouth daily.     Historical Provider, MD  metFORMIN (GLUMETZA) 500 MG (MOD) 24 hr tablet Take 2 tablets (1,000 mg total) by mouth 2 (two) times daily. Reported on 06/26/2015 10/27/15   Dustin Flock, MD  mirtazapine (REMERON) 45 MG tablet Take 45 mg by mouth at bedtime.    Historical Provider, MD  mometasone-formoterol (DULERA) 100-5 MCG/ACT AERO Inhale 2 puffs into the lungs 2 (two) times daily. 06/28/15   Fritzi Mandes, MD  nicotine (NICODERM CQ - DOSED IN MG/24 HOURS) 21 mg/24hr patch Place 1 patch (21 mg total) onto the skin daily. 12/13/15   Nicholes Mango, MD  oxyCODONE (OXYCONTIN) 20 mg 12 hr tablet Take 20 mg by mouth every 12 (twelve) hours.    Historical Provider, MD  Oxycodone HCl 10 MG TABS Take 10 mg by mouth every 6 (six) hours as needed.    Historical Provider, MD  polyethylene glycol (MIRALAX / GLYCOLAX) packet Take 17 g by mouth daily.    Historical Provider, MD  predniSONE (DELTASONE) 20 MG tablet Take 3 tablets (60 mg total) by mouth daily. 12/29/15   Loney Hering, MD  risperiDONE (RISPERDAL) 2 MG tablet Take 2 mg by mouth at bedtime.    Historical Provider, MD  roflumilast (DALIRESP) 500 MCG TABS tablet Take 500 mcg by mouth daily.    Historical Provider, MD  senna (SENNA-LAX) 8.6 MG tablet Take 2 tablets by mouth daily as needed for constipation.    Historical Provider, MD  tiotropium (SPIRIVA) 18 MCG inhalation capsule Place 18 mcg into inhaler and inhale daily.    Historical Provider, MD      VITAL SIGNS:  Blood pressure 118/83, pulse 100, temperature 97.9 F (36.6 C), temperature source Oral, resp. rate (!) 21, height 6' (1.829 m), weight 84.8 kg (187 lb), SpO2 96 %.  PHYSICAL EXAMINATION:   GENERAL:  50 y.o.-year-old patient lying in the bed with mild acute distress.  EYES: Pupils equal, round, reactive to light and accommodation. No scleral icterus. Extraocular muscles intact.  HEENT: Head atraumatic, normocephalic. Oropharynx and nasopharynx clear.  NECK:  Supple, no jugular venous distention. No thyroid enlargement, no tenderness.  LUNGS: Distant breath sounds bilaterally, no wheezing, rales,rhonchi or crepitation. No use of accessory muscles of respiration.  CARDIOVASCULAR: S1, S2 normal. No murmurs, rubs, or gallops. Tachycardia ABDOMEN: Soft, nontender, nondistended. Bowel sounds present. No organomegaly or mass.  EXTREMITIES: No pedal edema, cyanosis, or clubbing.  NEUROLOGIC: Cranial nerves II through XII are intact. Muscle strength 5/5 in all extremities. Sensation intact. Gait not checked.  PSYCHIATRIC:  patient is alert and oriented x 3.  SKIN: No obvious rash, lesion, or ulcer.   LABORATORY PANEL:   CBC  Recent Labs Lab 01/23/16 1313  WBC  6.3  HGB 13.7  HCT 40.6  PLT 191   ------------------------------------------------------------------------------------------------------------------  Chemistries   Recent Labs Lab 01/23/16 1313  NA 137  K 3.9  CL 95*  CO2 35*  GLUCOSE 142*  BUN 7  CREATININE 0.85  CALCIUM 9.6  AST 77*  ALT 69*  ALKPHOS 75  BILITOT 1.1   ------------------------------------------------------------------------------------------------------------------  Cardiac Enzymes  Recent Labs Lab 01/23/16 1627  TROPONINI <0.03   ------------------------------------------------------------------------------------------------------------------  RADIOLOGY:  Dg Chest 2 View  Result Date: 01/23/2016 CLINICAL DATA:  Short of breath EXAM: CHEST  2 VIEW COMPARISON:  12/29/2015 FINDINGS: COPD with pulmonary hyperinflation. Cardiac enlargement without heart failure. Negative for infiltrate effusion or mass. IMPRESSION: No active  cardiopulmonary disease. Electronically Signed   By: Franchot Gallo M.D.   On: 01/23/2016 14:34   Ct Angio Chest Pe W And/or Wo Contrast  Result Date: 01/23/2016 CLINICAL DATA:  Increased shortness of breath for 1 week, tachycardia, asymmetric leg swelling, history of COPD, hypertension, coronary artery disease, atrial fibrillation, CHF, smoker EXAM: CT ANGIOGRAPHY CHEST WITH CONTRAST TECHNIQUE: Multidetector CT imaging of the chest was performed using the standard protocol during bolus administration of intravenous contrast. Multiplanar CT image reconstructions and MIPs were obtained to evaluate the vascular anatomy. CONTRAST:  75 cc Isovue 370 IV COMPARISON:  06/07/2015 FINDINGS: Cardiovascular: Aneurysmal dilatation of the ascending thoracic aorta 4.0 cm diameter image 49. No evidence of aortic dissection. Pulmonary arteries well opacified and patent. No evidence of pulmonary embolism. Minimal pericardial effusion. Mediastinum/Nodes: Question mild diffuse wall thickening of the thoracic esophagus versus artifact from underdistention. Few normal size mediastinal and hilar nodes with a borderline enlarged RIGHT hilar node 10 mm short axis image 52. Base of cervical region unremarkable. Small subcutaneous nodule anterior chest wall LEFT of midline at the level of the aortic arch 17 x 14 mm image 35 not significantly. Lungs/Pleura: Spiculated mass RIGHT lower lobe 2.7 x 1.9 x 2.0 cm containing small focus of central cavitation highly suspicious for a pulmonary neoplasm. This represents interval increase in size since the prior CT when this measured 2.0 cm in greatest size and was thin/air-filled. Question 4 mm scar LEFT lower lobe image 54. Minimal atelectasis at base of lingula. Remaining lungs clear. No infiltrate, pleural effusion or pneumothorax. Upper Abdomen: Questionable gallbladder wall thickening without calcification. No biliary dilatation. Stomach decompressed, with suboptimal assessment of gastric  wall thickness. Musculoskeletal: No definite acute osseous findings. Review of the MIP images confirms the above findings. IMPRESSION: No evidence pulmonary embolism. 2.7 x 1.9 x 2.0 cm diameter spiculated mass in RIGHT lower lobe with minimal central cavitation highly suspicious for a primary pulmonary neoplasm. Tissue diagnosis recommended. Single borderline enlarged RIGHT hilar lymph node 10 mm short axis. Questionable gallbladder wall thickening, unable to exclude acute cholecystitis with this appearance ; recommend correlation with LFTs and if clinically indicated can assess by RIGHT upper quadrant sonography. 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 Recommend annual imaging followup by CTA or MRA. This recommendation follows 2010 ACCF/AHA/AATS/ACR/ASA/SCA/SCAI/SIR/STS/SVM Guidelines for the Diagnosis and Management of Patients with Thoracic Aortic Disease. Circulation. 2010; 121: R604-V409 Electronically Signed   By: Lavonia Dana M.D.   On: 01/23/2016 15:39   US Abdomen Limited Ruq  Result Date: 01/23/2016 CLINICAL DATA:  Abdominal pain EXAM: US ABDOMEN LIMITED - RIGHT UPPER QUADRANT COMPARISON:  12/29/2015 FINDINGS: Gallbladder: The gallbladder wall is thickened measuring 6.6 mm in maximum thickness. No gallstones. Negative sonographic Murphy's sign.  Common bile duct: Diameter: 5 mm Liver: No focal lesion identified. Within normal limits in parenchymal echogenicity. IMPRESSION: 1. Diffuse gallbladder wall thickening. The appearance is nonspecific but may reflect 8 calculus cholecystitis. If further imaging is clinically indicated at this time a hepatobiliary scan may be helpful to assess patency of the cystic duct. Electronically Signed   By: Kerby Moors M.D.   On: 01/23/2016 17:16    EKG:   Sinus tachycardia. Pulmonary disease pattern  IMPRESSION AND PLAN:   Craig Wright  is a 50 y.o. male with a known history of  Atrial fibrillation, COPD on 5 L of nasal Oxygen with ongoing tobacco abuse, history of CHF, CAD, diabetes and hypertension comes to the emergency room from [facility] group [facility] with complaints of increasing shortness of breath and wheezing. Patient was found to have sats down in the 80s  1. Acute on chronic hypoxic respiratory failure secondary to COPD exacerbation in the setting of ongoing heavy tobacco abuse -Admit to medical floor -IV Solu-Medrol, nebulizers, inhalers, continue 5 L of nasal cannula oxygen which is patient's chronic dose  2. Right lower lobe spiculated mass increased in size highly suspicious for neoplasm -Consider oncology consultation  3. Acalculous cholecystitis -Complains of right upper quadrant abdominal pain and ultrasound of the abdomen suggestive of gallbladder wall thickening -Consult surgery. Recommend HIDA scan tomorrow. Given poor respiratory status patient not a candidate for surgery. Cholecystostomy per INR is recommended if HIDA scan is positive -Empiric IV Unasyn  4. Tobacco abuse advised sedation for minutes spent in patient appears non-motivated  5. Chronic A. Fib -Mildly tachycardic continue home meds  6. Chronic pain/back pain -Continue oral pain meds  7. DVT prophylaxis subcutaneous Lovenox  8. 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 Care management/CSW for discharge planning  All the records are reviewed and case discussed with ED provider. Management plans discussed with the patient, family and they are in agreement.  CODE STATUS: DNR/DNI. Patient carries out of facility will form  TOTAL CRITICAL TIME TAKING CARE OF THIS PATIENT: 50 minutes.    Craig Wright M.D on 01/23/2016 at 7:05 PM  Between 7am to 6pm - Pager - [phone]  After 6pm go to [email or web address] - password [email or web address] [facility]   Office  [phone]  CC: Primary care physician; Pcp Not In System

## 2016-01-24 ENCOUNTER — Inpatient Hospital Stay: Payer: Medicaid Other

## 2016-01-24 DIAGNOSIS — R918 Other nonspecific abnormal finding of lung field: Secondary | ICD-10-CM

## 2016-01-24 DIAGNOSIS — Z8049 Family history of malignant neoplasm of other genital organs: Secondary | ICD-10-CM

## 2016-01-24 DIAGNOSIS — I110 Hypertensive heart disease with heart failure: Secondary | ICD-10-CM

## 2016-01-24 DIAGNOSIS — Z808 Family history of malignant neoplasm of other organs or systems: Secondary | ICD-10-CM

## 2016-01-24 DIAGNOSIS — Z9981 Dependence on supplemental oxygen: Secondary | ICD-10-CM

## 2016-01-24 DIAGNOSIS — Z800 Family history of malignant neoplasm of digestive organs: Secondary | ICD-10-CM

## 2016-01-24 DIAGNOSIS — F17210 Nicotine dependence, cigarettes, uncomplicated: Secondary | ICD-10-CM

## 2016-01-24 DIAGNOSIS — J441 Chronic obstructive pulmonary disease with (acute) exacerbation: Secondary | ICD-10-CM

## 2016-01-24 DIAGNOSIS — J9621 Acute and chronic respiratory failure with hypoxia: Secondary | ICD-10-CM

## 2016-01-24 DIAGNOSIS — R634 Abnormal weight loss: Secondary | ICD-10-CM

## 2016-01-24 DIAGNOSIS — Z79899 Other long term (current) drug therapy: Secondary | ICD-10-CM

## 2016-01-24 DIAGNOSIS — I4891 Unspecified atrial fibrillation: Secondary | ICD-10-CM

## 2016-01-24 DIAGNOSIS — I2510 Atherosclerotic heart disease of native coronary artery without angina pectoris: Secondary | ICD-10-CM

## 2016-01-24 DIAGNOSIS — K810 Acute cholecystitis: Principal | ICD-10-CM

## 2016-01-24 NOTE — Clinical Social Work Note (Signed)
Clinical Social Work Assessment  Patient Details  Name: Craig Wright MRN: [id number] Date of Birth: 02/25/1965  Date of referral:  01/24/16               Reason for consult:  Facility Placement                Permission sought to share information with:  Facility Sport and exercise psychologist, Family Supports Permission granted to share information::  Yes, Verbal Permission Granted  Name::        Agency::     Relationship::     Contact Information:     Housing/Transportation Living arrangements for the past 2 months:  Group Home Source of Information:  Patient, Parent Patient Interpreter Needed:  None Criminal Activity/Legal Involvement Pertinent to Current Situation/Hospitalization:  No - Comment as needed Significant Relationships:  Parents Lives with:  Facility Resident Do you feel safe going back to the place where you live?  Yes Need for family participation in patient care:  No (Coment)  Care giving concerns:  Patient is independent in adl's.    Social Worker assessment / plan:  Patient is a resident at [facility]. Patient was in the hospital recently in October. CSW met with patient and his mother this morning and explained the role and purpose of visit. Patient states that he intends to return to [place] at discharge. Patient noticeably not feeling well but was pleasant and able to answer my questions appropriately. Patient nor his mother had any concerns,   Employment status:  Disabled (Comment on whether or not currently receiving Disability) Insurance information:  Medicaid In [place] PT Recommendations:    Information / Referral to community resources:     Patient/Family's Response to care:  Patient and mother expressed appreciation for CSW assistance.  Patient/Family's Understanding of and Emotional Response to Diagnosis, Current Treatment, and Prognosis:  Patient has limited insight into his condition as he refused a test this morning that would have helped to  possibly diagnosis where his pain was coming from. Patient stated he just did not feel like doing it. MD met with patient afterwards and spoke with him.   Emotional Assessment Appearance:  Appears stated age Attitude/Demeanor/Rapport:   (pleasant and cooperative) Affect (typically observed):  Calm, Pleasant Orientation:  Oriented to Self, Oriented to Place, Oriented to  Time, Oriented to Situation Alcohol / Substance use:  Not Applicable Psych involvement (Current and /or in the community):  No (Comment)  Discharge Needs  Concerns to be addressed:  Care Coordination Readmission within the last 30 days:  No Current discharge risk:  None Barriers to Discharge:  No Barriers Identified   Shela Leff, LCSW 01/24/2016, 10:49 AM

## 2016-01-24 NOTE — Progress Notes (Signed)
01/24/2016  Subjective: Patient was admitted yesterday with COPD exacerbation and workup concerning for possible acalculous cholecystitis.  This morning, the patient refused getting HIDA scan done as he "didnt want to lay down for 4 hours".  Currently he denies any nausea or vomiting and denies any abdominal pain.  Vital signs: Temp:  [97.9 F (36.6 C)-98.1 F (36.7 C)] 98.1 F (36.7 C) (12/01 0522) Pulse Rate:  [65-126] 73 (12/01 0522) Resp:  [16-27] 20 (12/01 0522) BP: (108-139)/(55-95) 108/55 (12/01 0522) SpO2:  [90 %-100 %] 98 % (12/01 0522) Weight:  [71.6 kg (157 lb 14.4 oz)-84.8 kg (187 lb)] 71.6 kg (157 lb 14.4 oz) (11/30 2203)   Intake/Output: 11/30 0701 - 12/01 0700 In: 776.4 [I.V.:676.4; IV Piggyback:100] Out: -     Physical Exam: Constitutional:  No acute distress, appears more comfortable Abdomen:  Soft, non-distended, non-tender to palpation.  Negative Murphy's sign.   Labs:   Recent Labs  01/23/16 1313  WBC 6.3  HGB 13.7  HCT 40.6  PLT 191    Recent Labs  01/23/16 1313  NA 137  K 3.9  CL 95*  CO2 35*  GLUCOSE 142*  BUN 7  CREATININE 0.85  CALCIUM 9.6   No results for input(s): LABPROT, INR in the last 72 hours.  Imaging: Dg Chest 2 View  Result Date: 01/23/2016 CLINICAL DATA:  Short of breath EXAM: CHEST  2 VIEW COMPARISON:  12/29/2015 FINDINGS: COPD with pulmonary hyperinflation. Cardiac enlargement without heart failure. Negative for infiltrate effusion or mass. IMPRESSION: No active cardiopulmonary disease. Electronically Signed   By: Franchot Gallo M.D.   On: 01/23/2016 14:34   Ct Angio Chest Pe W And/or Wo Contrast  Result Date: 01/23/2016 CLINICAL DATA:  Increased shortness of breath for 1 week, tachycardia, asymmetric leg swelling, history of COPD, hypertension, coronary artery disease, atrial fibrillation, CHF, smoker EXAM: CT ANGIOGRAPHY CHEST WITH CONTRAST TECHNIQUE: Multidetector CT imaging of the chest was performed using the  standard protocol during bolus administration of intravenous contrast. Multiplanar CT image reconstructions and MIPs were obtained to evaluate the vascular anatomy. CONTRAST:  75 cc Isovue 370 IV COMPARISON:  06/07/2015 FINDINGS: Cardiovascular: Aneurysmal dilatation of the ascending thoracic aorta 4.0 cm diameter image 49. No evidence of aortic dissection. Pulmonary arteries well opacified and patent. No evidence of pulmonary embolism. Minimal pericardial effusion. Mediastinum/Nodes: Question mild diffuse wall thickening of the thoracic esophagus versus artifact from underdistention. Few normal size mediastinal and hilar nodes with a borderline enlarged RIGHT hilar node 10 mm short axis image 52. Base of cervical region unremarkable. Small subcutaneous nodule anterior chest wall LEFT of midline at the level of the aortic arch 17 x 14 mm image 35 not significantly. Lungs/Pleura: Spiculated mass RIGHT lower lobe 2.7 x 1.9 x 2.0 cm containing small focus of central cavitation highly suspicious for a pulmonary neoplasm. This represents interval increase in size since the prior CT when this measured 2.0 cm in greatest size and was thin/air-filled. Question 4 mm scar LEFT lower lobe image 54. Minimal atelectasis at base of lingula. Remaining lungs clear. No infiltrate, pleural effusion or pneumothorax. Upper Abdomen: Questionable gallbladder wall thickening without calcification. No biliary dilatation. Stomach decompressed, with suboptimal assessment of gastric wall thickness. Musculoskeletal: No definite acute osseous findings. Review of the MIP images confirms the above findings. IMPRESSION: No evidence pulmonary embolism. 2.7 x 1.9 x 2.0 cm diameter spiculated mass in RIGHT lower lobe with minimal central cavitation highly suspicious for a primary pulmonary neoplasm. Tissue diagnosis recommended.  Single borderline enlarged RIGHT hilar lymph node 10 mm short axis. Questionable gallbladder wall thickening, unable to  exclude acute cholecystitis with this appearance ; recommend correlation with LFTs and if clinically indicated can assess by RIGHT upper quadrant sonography. 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 Recommend annual imaging followup by CTA or MRA. This recommendation follows 2010 ACCF/AHA/AATS/ACR/ASA/SCA/SCAI/SIR/STS/SVM Guidelines for the Diagnosis and Management of Patients with Thoracic Aortic Disease. Circulation. 2010; 121: F110-Y111 Electronically Signed   By: Lavonia Dana M.D.   On: 01/23/2016 15:39   US Abdomen Limited Ruq  Result Date: 01/23/2016 CLINICAL DATA:  Abdominal pain EXAM: US ABDOMEN LIMITED - RIGHT UPPER QUADRANT COMPARISON:  12/29/2015 FINDINGS: Gallbladder: The gallbladder wall is thickened measuring 6.6 mm in maximum thickness. No gallstones. Negative sonographic Murphy's sign. Common bile duct: Diameter: 5 mm Liver: No focal lesion identified. Within normal limits in parenchymal echogenicity. IMPRESSION: 1. Diffuse gallbladder wall thickening. The appearance is nonspecific but may reflect 8 calculus cholecystitis. If further imaging is clinically indicated at this time a hepatobiliary scan may be helpful to assess patency of the cystic duct. Electronically Signed   By: Kerby Moors M.D.   On: 01/23/2016 17:16    Assessment/Plan: 50 yo male with COPD exacerbation and possible acalculous cholecystitis.  --Patient refused HIDA scan, but currently clinically improved and asymptomatic.  May do trial of po challenge with clear liquids and if he tolerates, possibly advance diet. --Continue antibiotics. --No acute surgical needs at this time.   Melvyn Neth, [place] 7a-7p:  [place] 7p-7a:  [phone]

## 2016-01-24 NOTE — Progress Notes (Signed)
Pt complained of pain before PRN pain meds were due. Pulled '10mg'$  oxycodone IR as ordered.  Pt was asleep when I entered the room.  Placed meds in pharmacy return bin,witnessed by Alanson Puls, RN. Charlane Ferretti, pharmacy tech brought to my attention that '20mg'$  oxycontin ER was documented as returned.  It appears that I documented returning the morning dose of oxycontin ER that pt took instead of the PRN dose of oxycodone IR as intended.  Pharm tech Kenney Houseman Allred took the oxycodone IR from return bin to return to pharmacy.  Spoke with Barbaraann Share in pharmacy and followed her instructions to resolve discrepancy in the pyxis.  This was witnessed by Darrol Jump, RN.Dola Argyle, RN

## 2016-01-24 NOTE — Progress Notes (Signed)
Patient ID: Craig Wright, male   DOB: 01/22/1966, 50 y.o.   MRN: [id number]  [facility] PROGRESS NOTE  BOB DAVERSA ELF:[id number] DOB: Oct 26, 1965 DOA: 01/23/2016 PCP: Pcp Not In System  HPI/Subjective: Patient came in yesterday with shortness of breath and right upper quadrant abdominal pain. Patient states that his pain is gone. He is breathing a little bit better. States he always wheezes. Patient refused HIDA scan this morning.  Objective: Vitals:   01/23/16 2203 01/24/16 0522  BP: 112/64 (!) 108/55  Pulse: 69 73  Resp: (!) 22 20  Temp: 98.1 F (36.7 C) 98.1 F (36.7 C)    Filed Weights   01/23/16 1310 01/23/16 2203  Weight: 84.8 kg (187 lb) 71.6 kg (157 lb 14.4 oz)    ROS: Review of Systems  Constitutional: Negative for chills and fever.  Eyes: Negative for blurred vision.  Respiratory: Positive for cough, shortness of breath and wheezing.   Cardiovascular: Negative for chest pain.  Gastrointestinal: Negative for abdominal pain, constipation, diarrhea, nausea and vomiting.  Genitourinary: Negative for dysuria.  Musculoskeletal: Negative for joint pain.  Neurological: Negative for dizziness and headaches.   Exam: Physical Exam  Constitutional: He is oriented to person, place, and time.  HENT:  Nose: No mucosal edema.  Mouth/Throat: No oropharyngeal exudate or posterior oropharyngeal edema.  Eyes: Conjunctivae, EOM and lids are normal. Pupils are equal, round, and reactive to light.  Neck: No JVD present. Carotid bruit is not present. No edema present. No thyroid mass and no thyromegaly present.  Cardiovascular: S1 normal and S2 normal.  Exam reveals no gallop.   No murmur heard. Pulses:      Dorsalis pedis pulses are 2+ on the right side, and 2+ on the left side.  Respiratory: No respiratory distress. He has decreased breath sounds in the right middle field, the right lower field, the left upper field, the left middle field and the left lower field.  He has wheezes in the right middle field, the right lower field, the left middle field and the left lower field. He has no rhonchi. He has no rales.  GI: Soft. Bowel sounds are normal. There is no tenderness.  Musculoskeletal:       Right ankle: He exhibits no swelling.       Left ankle: He exhibits no swelling.  Lymphadenopathy:    He has no cervical adenopathy.  Neurological: He is alert and oriented to person, place, and time. No cranial nerve deficit.  Skin: Skin is warm. No rash noted. Nails show no clubbing.  Psychiatric: He has a normal mood and affect.      Data Reviewed: Basic Metabolic Panel:  Recent Labs Lab 01/23/16 1313  NA 137  K 3.9  CL 95*  CO2 35*  GLUCOSE 142*  BUN 7  CREATININE 0.85  CALCIUM 9.6   Liver Function Tests:  Recent Labs Lab 01/23/16 1313  AST 77*  ALT 69*  ALKPHOS 75  BILITOT 1.1  PROT 7.0  ALBUMIN 3.8    Recent Labs Lab 01/23/16 1313  LIPASE 18   CBC:  Recent Labs Lab 01/23/16 1313  WBC 6.3  HGB 13.7  HCT 40.6  MCV 89.5  PLT 191   Cardiac Enzymes:  Recent Labs Lab 01/23/16 1313 01/23/16 1627  TROPONINI <0.03 <0.03   BNP (last 3 results)  Recent Labs  12/19/15 1754 12/29/15 0134 01/23/16 1313  BNP 60.0 36.0 47.0      Studies: Dg Chest 2 View  Result Date: 01/23/2016 CLINICAL DATA:  Short of breath EXAM: CHEST  2 VIEW COMPARISON:  12/29/2015 FINDINGS: COPD with pulmonary hyperinflation. Cardiac enlargement without heart failure. Negative for infiltrate effusion or mass. IMPRESSION: No active cardiopulmonary disease. Electronically Signed   By: Franchot Gallo M.D.   On: 01/23/2016 14:34   Ct Angio Chest Pe W And/or Wo Contrast  Result Date: 01/23/2016 CLINICAL DATA:  Increased shortness of breath for 1 week, tachycardia, asymmetric leg swelling, history of COPD, hypertension, coronary artery disease, atrial fibrillation, CHF, smoker EXAM: CT ANGIOGRAPHY CHEST WITH CONTRAST TECHNIQUE: Multidetector CT  imaging of the chest was performed using the standard protocol during bolus administration of intravenous contrast. Multiplanar CT image reconstructions and MIPs were obtained to evaluate the vascular anatomy. CONTRAST:  75 cc Isovue 370 IV COMPARISON:  06/07/2015 FINDINGS: Cardiovascular: Aneurysmal dilatation of the ascending thoracic aorta 4.0 cm diameter image 49. No evidence of aortic dissection. Pulmonary arteries well opacified and patent. No evidence of pulmonary embolism. Minimal pericardial effusion. Mediastinum/Nodes: Question mild diffuse wall thickening of the thoracic esophagus versus artifact from underdistention. Few normal size mediastinal and hilar nodes with a borderline enlarged RIGHT hilar node 10 mm short axis image 52. Base of cervical region unremarkable. Small subcutaneous nodule anterior chest wall LEFT of midline at the level of the aortic arch 17 x 14 mm image 35 not significantly. Lungs/Pleura: Spiculated mass RIGHT lower lobe 2.7 x 1.9 x 2.0 cm containing small focus of central cavitation highly suspicious for a pulmonary neoplasm. This represents interval increase in size since the prior CT when this measured 2.0 cm in greatest size and was thin/air-filled. Question 4 mm scar LEFT lower lobe image 54. Minimal atelectasis at base of lingula. Remaining lungs clear. No infiltrate, pleural effusion or pneumothorax. Upper Abdomen: Questionable gallbladder wall thickening without calcification. No biliary dilatation. Stomach decompressed, with suboptimal assessment of gastric wall thickness. Musculoskeletal: No definite acute osseous findings. Review of the MIP images confirms the above findings. IMPRESSION: No evidence pulmonary embolism. 2.7 x 1.9 x 2.0 cm diameter spiculated mass in RIGHT lower lobe with minimal central cavitation highly suspicious for a primary pulmonary neoplasm. Tissue diagnosis recommended. Single borderline enlarged RIGHT hilar lymph node 10 mm short axis.  Questionable gallbladder wall thickening, unable to exclude acute cholecystitis with this appearance ; recommend correlation with LFTs and if clinically indicated can assess by RIGHT upper quadrant sonography. 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 Recommend annual imaging followup by CTA or MRA. This recommendation follows 2010 ACCF/AHA/AATS/ACR/ASA/SCA/SCAI/SIR/STS/SVM Guidelines for the Diagnosis and Management of Patients with Thoracic Aortic Disease. Circulation. 2010; 121: H476-L465 Electronically Signed   By: Lavonia Dana M.D.   On: 01/23/2016 15:39   US Abdomen Limited Ruq  Result Date: 01/23/2016 CLINICAL DATA:  Abdominal pain EXAM: US ABDOMEN LIMITED - RIGHT UPPER QUADRANT COMPARISON:  12/29/2015 FINDINGS: Gallbladder: The gallbladder wall is thickened measuring 6.6 mm in maximum thickness. No gallstones. Negative sonographic Murphy's sign. Common bile duct: Diameter: 5 mm Liver: No focal lesion identified. Within normal limits in parenchymal echogenicity. IMPRESSION: 1. Diffuse gallbladder wall thickening. The appearance is nonspecific but may reflect 8 calculus cholecystitis. If further imaging is clinically indicated at this time a hepatobiliary scan may be helpful to assess patency of the cystic duct. Electronically Signed   By: Kerby Moors M.D.   On: 01/23/2016 17:16    Scheduled Meds: . ampicillin-sulbactam (UNASYN) IV  3 g Intravenous Q6H  .  buPROPion  100 mg Oral BH-q7a  . clonazePAM  1 mg Oral BID  . diltiazem  240 mg Oral Daily  . DULoxetine  60 mg Oral Daily  . enoxaparin (LOVENOX) injection  40 mg Subcutaneous Q24H  . furosemide  40 mg Oral Daily  . magnesium oxide  400 mg Oral Daily  . mouth rinse  15 mL Mouth Rinse BID  . metFORMIN  1,000 mg Oral BID WC  . methylPREDNISolone (SOLU-MEDROL) injection  60 mg Intravenous Q24H  . mirtazapine  15 mg Oral QHS  . mometasone-formoterol  2 puff Inhalation  BID  . nicotine  21 mg Transdermal Daily  . oxyCODONE  20 mg Oral Q12H  . polyethylene glycol  17 g Oral Daily  . potassium chloride SA  20 mEq Oral BID  . roflumilast  500 mcg Oral Daily    Assessment/Plan:  1. COPD exacerbation. Continue Solu-Medrol and nebulizer treatments. Patient on empiric antibiotics. 2. Chronic respiratory failure on 5 L of oxygen. Continue his normal oxygen. 3. Right upper quadrant abdominal pain this has resolved. Patient refused HIDA scan. Advance diet to solid food and see what happens. Stop IV fluids. 4. Spiculated lung nodule. Will need outpatient oncology follow-up for PET CT scan. Patient is not a good candidate for major procedure. 5. Type 2 diabetes mellitus. Sugars will be high on steroids. Continue metformin 6. Tobacco abuse on nicotine patch 7. Chronic pain on oxycodone 8. Anxiety, depression on Xanax and Wellbutrin and Remeron 9. History of CHF stop fluids and continue his Lasix. Currently no signs. 10. History of A. fib on Cardizem  Code Status:     Code Status Orders        Start     Ordered   01/23/16 2204  Do not attempt resuscitation (DNR)  Continuous    Question Answer Comment  In the event of cardiac or respiratory ARREST Do not call a "code blue"   In the event of cardiac or respiratory ARREST Do not perform Intubation, CPR, defibrillation or ACLS   In the event of cardiac or respiratory ARREST Use medication by any route, position, wound care, and other measures to relive pain and suffering. May use oxygen, suction and manual treatment of airway obstruction as needed for comfort.      01/23/16 2203    Code Status History    Date Active Date Inactive Code Status Order ID Comments User Context   12/09/2015 11:40 AM 12/12/2015  4:07 PM Full Code [id number]  Epifanio Lesches, MD ED   10/24/2015  1:10 PM 10/27/2015  2:10 PM DNR [id number]  Gladstone Lighter, MD ED   10/20/2015 10:51 AM 10/23/2015  4:37 PM DNR [id number]  Laverle Hobby, MD Inpatient   10/19/2015  8:55 AM 10/20/2015 10:51 AM Full Code [id number]  Dustin Flock, MD Inpatient   10/18/2015 12:29 PM 10/19/2015  8:55 AM DNR [id number]  Laverle Hobby, MD Inpatient   10/18/2015  8:53 AM 10/18/2015 12:29 PM Full Code [id number]  Loletha Grayer, MD Inpatient   10/15/2015  4:43 PM 10/18/2015  8:53 AM DNR [id number]  Lytle Butte, MD ED   10/15/2015  4:43 PM 10/15/2015  4:43 PM Full Code [id number]  Lytle Butte, MD ED   06/26/2015 10:13 AM 06/28/2015  6:05 PM DNR [id number]  Bettey Costa, MD Inpatient   06/26/2015  6:05 AM 06/26/2015 10:13 AM Full Code [id number]  Saundra Shelling, MD Inpatient   06/08/2015 12:15 AM 06/10/2015  4:32 PM Full Code [id number]  Saundra Shelling, MD Inpatient    Advance Directive Documentation   Flowsheet Row Most Recent Value  Type of Advance Directive  Healthcare Power of Attorney, Living will, Out of facility DNR (pink MOST or yellow form)  Pre-existing out of facility DNR order (yellow form or pink MOST form)  Yellow form placed in chart (order not valid for inpatient use)  "MOST" Form in Place?  No data     Family Communication: Mother at the bedside Disposition Plan: Potentially home over the weekend  Consultants:  Gen. surgery  Antibiotics:  Unasyn  Time spent: 28 minutes  [place], [place]

## 2016-01-24 NOTE — Progress Notes (Signed)
Nutrition Brief Note  Patient identified on the Malnutrition Screening Tool (MST) Report  Body mass index is 21.42 kg/m.  Pt reports weight has been stable.   Current diet order is Heart Healthy, patient is consuming approximately 100% of meals at this time. Pt reports appetite is good and eating well prior to admission. Labs and medications reviewed.   No nutrition interventions warranted at this time. If nutrition issues arise, please consult RD.   Kerman Passey [place], [place], LDN [phone] Pager  [phone] Weekend/On-Call Pager

## 2016-01-25 MED ORDER — AMOXICILLIN-POT CLAVULANATE 875-125 MG PO TABS
1.0000 | ORAL_TABLET | Freq: Two times a day (BID) | ORAL | Status: DC
  Administered 2016-01-25 – 2016-01-26 (×3): 1 via ORAL
  Filled 2016-01-25 (×3): qty 1

## 2016-01-25 MED ORDER — OXYCODONE HCL 5 MG PO TABS
10.0000 mg | ORAL_TABLET | ORAL | Status: DC | PRN
  Administered 2016-01-25: 10 mg via ORAL
  Filled 2016-01-25: qty 2

## 2016-01-25 NOTE — Progress Notes (Signed)
01/25/2016  Subjective: Patient refused HIDA scan and his diet has been advanced. He has been tolerating this well with no further abdominal pain. Denies any nausea.   Vital signs: Temp:  [98.1 F (36.7 C)-98.2 F (36.8 C)] 98.1 F (36.7 C) (12/02 1405) Pulse Rate:  [63-88] 88 (12/02 1405) Resp:  [16-20] 16 (12/02 1405) BP: (124-141)/(70-74) 141/74 (12/02 1405) SpO2:  [95 %-100 %] 99 % (12/02 1405)   Intake/Output: 12/01 0701 - 12/02 0700 In: 984 [P.O.:784; IV Piggyback:200] Out: 2275 [Urine:2275] Last BM Date: 01/22/16  Physical Exam: Constitutional: No acute distress Abdomen: Soft, nondistended, nontender to palpation. Negative Murphy sign.  Labs:   Recent Labs  01/23/16 1313  WBC 6.3  HGB 13.7  HCT 40.6  PLT 191    Recent Labs  01/23/16 1313  NA 137  K 3.9  CL 95*  CO2 35*  GLUCOSE 142*  BUN 7  CREATININE 0.85  CALCIUM 9.6   No results for input(s): LABPROT, INR in the last 72 hours.  Imaging: No results found.  Assessment/Plan: 51 year old male admitted with a COPD exacerbation and possible cholecystitis although this has improved with conservative measures.  -Continue antibiotics for possible acalculous cholecystitis. Would recommend a total course of 10 days for this. -Patient is tolerating a diet with no further abdominal pain. Will sign off.  Please feel free to call if any questions or concerns.   Melvyn Neth, [place] 7a-7p:  [place] 7p-7a:  [phone]

## 2016-01-25 NOTE — Progress Notes (Signed)
PT Cancellation Note  Patient Details Name: Craig Wright MRN: [id number] DOB: [birth year]   Cancelled Treatment:    Reason Eval/Treat Not Completed: Patient declined, no reason specified (States he is fine, no needs.  Nsg aware of his decision).  Will check tomorrow to see if he has changed his mind   Ramond Dial 01/25/2016, 12:03 PM    Mee Hives, PT MS Acute Rehab Dept. Number: [place]

## 2016-01-25 NOTE — Consult Note (Signed)
[facility]  Date of admission:  01/23/2016  Inpatient day:  01/24/2016  Consulting physician:  Dr.Sona Posey Pronto   Reason for Consultation:  Right lower lobe spiculated mass noted on CT scan.  Chief Complaint: Craig Wright is a 50 y.o. male who was admitted through the emergency room with acute on chronic hypoxic respiratory failure secondary to COPD and acalculous cholecystitis.  HPI:  The patient notes a greater than 40 pack year smoking history.  He currently smokes 1 pack a day.  He is on 5 liter/min oxygen via nasal cannula at baseline.  He presented to the ER with increasing shortness of breath and right upper quadrant abdominal pain.   Chest CT angiogram on 01/23/2016 revealed no evidence of pulmonary embolism.  There was a 2.7 x 1.9 x 2.0 cm spiculated right lower lobe lung lesion with minimal central cavitation highly suspicious for a primary pulmonary neoplasm.  There was a sngle 1.0 cm enlarged RIGHT hilar lymph node.  RUQ ultrasound on 01/23/2016 revealed diffuse gallbladder wall thickening.  He has been seen by surgery.  He declined HIDA scan.  He stats that his abdominal pain is better.  He has been treated with solumedrol, nebulizers, and empiric antibiotics (Unasyn).   Past Medical History:  Diagnosis Date  . A-fib ([place])   . Anxiety disorder   . Asthma   . CHF (congestive heart failure) ([place])   . COPD (chronic obstructive pulmonary disease) ([place])   . Coronary artery disease   . Diabetes mellitus without complication ([place])   . Hypertension     Past Surgical History:  Procedure Laterality Date  . KNEE SURGERY Left   . ORTHOPEDIC SURGERY     multiple  . SKIN GRAFT    . TOTAL HIP ARTHROPLASTY Left     Family History  Problem Relation Age of Onset  . Cervical cancer Mother   . Colon cancer Father     Social History:  reports that he has been smoking Cigarettes.  He has a 40.00 pack-year smoking history. He has never used smokeless  tobacco. He reports that he does not drink alcohol or use drugs.  The patient lives at [facility] group home in [place].  He states that he has lived there for 1 year, but is unaware of the circumstances "spur of the moment".  His family consists of his mother.  He has no children.  He is disabled.  He is alone today.  Allergies: No Known Allergies  Medications Prior to Admission  Medication Sig Dispense Refill  . acetaminophen (TYLENOL) 325 MG tablet Take 650 mg by mouth every 4 (four) hours as needed.    Marland Kitchen buPROPion (WELLBUTRIN SR) 100 MG 12 hr tablet Take 100 mg by mouth every morning.    . clonazePAM (KLONOPIN) 1 MG tablet Take 1 mg by mouth 2 (two) times daily.    Marland Kitchen diltiazem (CARDIZEM CD) 240 MG 24 hr capsule Take 1 capsule (240 mg total) by mouth daily. 30 capsule 0  . DULoxetine (CYMBALTA) 60 MG capsule Take 60 mg by mouth daily.     . furosemide (LASIX) 40 MG tablet Take 40 mg by mouth daily.     Marland Kitchen ipratropium-albuterol (DUONEB) 0.5-2.5 (3) MG/3ML SOLN Take 3 mLs by nebulization every 4 (four) hours as needed.    . magnesium oxide (MAG-OX) 400 MG tablet Take 400 mg by mouth daily.    . metFORMIN (GLUMETZA) 500 MG (MOD) 24 hr tablet Take 2 tablets (1,000  mg total) by mouth 2 (two) times daily. Reported on 06/26/2015 60 tablet 0  . mirtazapine (REMERON) 15 MG tablet Take 15 mg by mouth at bedtime.     . mometasone-formoterol (DULERA) 100-5 MCG/ACT AERO Inhale 2 puffs into the lungs 2 (two) times daily. 1 Inhaler 0  . nicotine (NICODERM CQ - DOSED IN MG/24 HOURS) 21 mg/24hr patch Place 1 patch (21 mg total) onto the skin daily. 28 patch 0  . ondansetron (ZOFRAN-ODT) 4 MG disintegrating tablet Take 4 mg by mouth every 8 (eight) hours as needed for nausea or vomiting.    Marland Kitchen oxyCODONE (OXYCONTIN) 20 mg 12 hr tablet Take 20 mg by mouth every 12 (twelve) hours.    . Oxycodone HCl 10 MG TABS Take 10 mg by mouth every 6 (six) hours as needed.    . polyethylene glycol (MIRALAX / GLYCOLAX) packet Take  17 g by mouth daily.    . potassium chloride SA (K-DUR,KLOR-CON) 20 MEQ tablet Take 20 mEq by mouth 2 (two) times daily.    . roflumilast (DALIRESP) 500 MCG TABS tablet Take 500 mcg by mouth daily.    Marland Kitchen senna (SENNA-LAX) 8.6 MG tablet Take 2 tablets by mouth daily as needed for constipation.      Review of Systems: GENERAL:  Feels "ok".  No fevers or sweats.  Weight loss of 40-50 pounds in past year. PERFORMANCE STATUS (ECOG): 2 HEENT:  No visual changes, runny nose, sore throat, mouth sores or tenderness. Lungs: Shortness of breath.  Cough clear.  No hemoptysis.  No history of TB or exposure. Cardiac:  No chest pain, palpitations, orthopnea, or PND. GI:  Upper abdominal pain, improved.  Fair appetite.  No nausea, vomiting, diarrhea, constipation, melena or hematochezia. GU:  No urgency, frequency, dysuria, or hematuria. Musculoskeletal:  No back pain.  No joint pain.  No muscle tenderness. Extremities:  No pain or swelling. Skin:  No rashes or skin changes. Neuro:  No headache, numbness or weakness, balance or coordination issues. Endocrine:  No diabetes, thyroid issues, hot flashes or night sweats. Psych:  No mood changes, depression or anxiety. Pain:  No focal pain. Review of systems:  All other systems reviewed and found to be negative.  Physical Exam:  Blood pressure 124/70, pulse 63, temperature 98.1 F (36.7 C), temperature source Oral, resp. rate 16, height 6' (1.829 m), weight 157 lb 14.4 oz (71.6 kg), SpO2 100 %.  GENERAL:  Well developed, well nourished, gentleman lying comfortably on the medical unit in no acute distress.  He avoids eye contact. MENTAL STATUS:  Alert and oriented to person, place and time. HEAD: Short hair.  Mustache. Normocephalic, atraumatic, face symmetric, no Cushingoid features. EYES:  Pupils equal round and reactive to light and accomodation.  No conjunctivitis or scleral icterus. ENT:  Nasal cannula in place. RESPIRATORY:  Poor respiratory excursion.   Diffuse soft wheezes.  No rales or rhonchi. CARDIOVASCULAR:  Regular rate and rhythm without murmur, rub or gallop. ABDOMEN:  Soft, non-tender, with active bowel sounds, and no hepatosplenomegaly.  No masses. SKIN:  Tattoos.  No rashes, ulcers or lesions. EXTREMITIES: Right ankle edema.  No skin discoloration or tenderness.  No palpable cords. LYMPH NODES: No palpable cervical, supraclavicular, axillary or inguinal adenopathy  NEUROLOGICAL: Unremarkable.  Moves all 4 extremities. PSYCH:  Avoids eye contact.   Results for orders placed or performed during the hospital encounter of 01/23/16 (from the past 48 hour(s))  Basic metabolic panel     Status: Abnormal  Collection Time: 01/23/16  1:13 PM  Result Value Ref Range   Sodium 137 135 - 145 mmol/L   Potassium 3.9 3.5 - 5.1 mmol/L   Chloride 95 (L) 101 - 111 mmol/L   CO2 35 (H) 22 - 32 mmol/L   Glucose, Bld 142 (H) 65 - 99 mg/dL   BUN 7 6 - 20 mg/dL   Creatinine, Ser 0.85 0.61 - 1.24 mg/dL   Calcium 9.6 8.9 - 10.3 mg/dL   GFR calc non Af Amer >60 >60 mL/min   GFR calc Af Amer >60 >60 mL/min    Comment: (NOTE) The eGFR has been calculated using the CKD EPI equation. This calculation has not been validated in all clinical situations. eGFR's persistently <60 mL/min signify possible Chronic Kidney Disease.    Anion gap 7 5 - 15  CBC     Status: Abnormal   Collection Time: 01/23/16  1:13 PM  Result Value Ref Range   WBC 6.3 3.8 - 10.6 K/uL   RBC 4.54 4.40 - 5.90 MIL/uL   Hemoglobin 13.7 13.0 - 18.0 g/dL   HCT 40.6 40.0 - 52.0 %   MCV 89.5 80.0 - 100.0 fL   MCH 30.3 26.0 - 34.0 pg   MCHC 33.9 32.0 - 36.0 g/dL   RDW 15.1 (H) 11.5 - 14.5 %   Platelets 191 150 - 440 K/uL  Troponin I     Status: None   Collection Time: 01/23/16  1:13 PM  Result Value Ref Range   Troponin I <0.03 <0.03 ng/mL  Brain natriuretic peptide     Status: None   Collection Time: 01/23/16  1:13 PM  Result Value Ref Range   B Natriuretic Peptide 47.0 0.0  - 100.0 pg/mL  Hepatic function panel     Status: Abnormal   Collection Time: 01/23/16  1:13 PM  Result Value Ref Range   Total Protein 7.0 6.5 - 8.1 g/dL   Albumin 3.8 3.5 - 5.0 g/dL   AST 77 (H) 15 - 41 U/L   ALT 69 (H) 17 - 63 U/L   Alkaline Phosphatase 75 38 - 126 U/L   Total Bilirubin 1.1 0.3 - 1.2 mg/dL   Bilirubin, Direct 0.4 0.1 - 0.5 mg/dL   Indirect Bilirubin 0.7 0.3 - 0.9 mg/dL  Lipase, blood     Status: None   Collection Time: 01/23/16  1:13 PM  Result Value Ref Range   Lipase 18 11 - 51 U/L  Influenza panel by PCR (type A & B, H1N1)     Status: None   Collection Time: 01/23/16  1:46 PM  Result Value Ref Range   Influenza A By PCR NEGATIVE NEGATIVE   Influenza B By PCR NEGATIVE NEGATIVE    Comment: (NOTE) The Xpert Xpress Flu assay is intended as an aid in the diagnosis of  influenza and should not be used as a sole basis for treatment.  This  assay is FDA approved for nasopharyngeal swab specimens only. Nasal  washings and aspirates are unacceptable for Xpert Xpress Flu testing.   Troponin I     Status: None   Collection Time: 01/23/16  4:27 PM  Result Value Ref Range   Troponin I <0.03 <0.03 ng/mL   Dg Chest 2 View  Result Date: 01/23/2016 CLINICAL DATA:  Short of breath EXAM: CHEST  2 VIEW COMPARISON:  12/29/2015 FINDINGS: COPD with pulmonary hyperinflation. Cardiac enlargement without heart failure. Negative for infiltrate effusion or mass. IMPRESSION: No active cardiopulmonary disease. Electronically  Signed   By: Franchot Gallo M.D.   On: 01/23/2016 14:34   Ct Angio Chest Pe W And/or Wo Contrast  Result Date: 01/23/2016 CLINICAL DATA:  Increased shortness of breath for 1 week, tachycardia, asymmetric leg swelling, history of COPD, hypertension, coronary artery disease, atrial fibrillation, CHF, smoker EXAM: CT ANGIOGRAPHY CHEST WITH CONTRAST TECHNIQUE: Multidetector CT imaging of the chest was performed using the standard protocol during bolus administration  of intravenous contrast. Multiplanar CT image reconstructions and MIPs were obtained to evaluate the vascular anatomy. CONTRAST:  75 cc Isovue 370 IV COMPARISON:  06/07/2015 FINDINGS: Cardiovascular: Aneurysmal dilatation of the ascending thoracic aorta 4.0 cm diameter image 49. No evidence of aortic dissection. Pulmonary arteries well opacified and patent. No evidence of pulmonary embolism. Minimal pericardial effusion. Mediastinum/Nodes: Question mild diffuse wall thickening of the thoracic esophagus versus artifact from underdistention. Few normal size mediastinal and hilar nodes with a borderline enlarged RIGHT hilar node 10 mm short axis image 52. Base of cervical region unremarkable. Small subcutaneous nodule anterior chest wall LEFT of midline at the level of the aortic arch 17 x 14 mm image 35 not significantly. Lungs/Pleura: Spiculated mass RIGHT lower lobe 2.7 x 1.9 x 2.0 cm containing small focus of central cavitation highly suspicious for a pulmonary neoplasm. This represents interval increase in size since the prior CT when this measured 2.0 cm in greatest size and was thin/air-filled. Question 4 mm scar LEFT lower lobe image 54. Minimal atelectasis at base of lingula. Remaining lungs clear. No infiltrate, pleural effusion or pneumothorax. Upper Abdomen: Questionable gallbladder wall thickening without calcification. No biliary dilatation. Stomach decompressed, with suboptimal assessment of gastric wall thickness. Musculoskeletal: No definite acute osseous findings. Review of the MIP images confirms the above findings. IMPRESSION: No evidence pulmonary embolism. 2.7 x 1.9 x 2.0 cm diameter spiculated mass in RIGHT lower lobe with minimal central cavitation highly suspicious for a primary pulmonary neoplasm. Tissue diagnosis recommended. Single borderline enlarged RIGHT hilar lymph node 10 mm short axis. Questionable gallbladder wall thickening, unable to exclude acute cholecystitis with this appearance  ; recommend correlation with LFTs and if clinically indicated can assess by RIGHT upper quadrant sonography. 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 Recommend annual imaging followup by CTA or MRA. This recommendation follows 2010 ACCF/AHA/AATS/ACR/ASA/SCA/SCAI/SIR/STS/SVM Guidelines for the Diagnosis and Management of Patients with Thoracic Aortic Disease. Circulation. 2010; 121: X902-I097 Electronically Signed   By: Lavonia Dana M.D.   On: 01/23/2016 15:39   US Abdomen Limited Ruq  Result Date: 01/23/2016 CLINICAL DATA:  Abdominal pain EXAM: US ABDOMEN LIMITED - RIGHT UPPER QUADRANT COMPARISON:  12/29/2015 FINDINGS: Gallbladder: The gallbladder wall is thickened measuring 6.6 mm in maximum thickness. No gallstones. Negative sonographic Murphy's sign. Common bile duct: Diameter: 5 mm Liver: No focal lesion identified. Within normal limits in parenchymal echogenicity. IMPRESSION: 1. Diffuse gallbladder wall thickening. The appearance is nonspecific but may reflect 8 calculus cholecystitis. If further imaging is clinically indicated at this time a hepatobiliary scan may be helpful to assess patency of the cystic duct. Electronically Signed   By: Kerby Moors M.D.   On: 01/23/2016 17:16    Assessment:  The patient is a 50 y.o. gentleman with probable right lower lobe lung cancer.  He has a 40 pack year smoking history and is on chronic oxygen via  at 5 liters/min.  Chest CT angiogram on 01/23/2016 revealed no evidence of pulmonary embolism.  There was a  2.7 x 1.9 x 2.0 cm spiculated right lower lobe lung lesion with minimal central cavitation highly suspicious for a primary pulmonary neoplasm.  There was a sngle 1.0 cm enlarged RIGHT hilar lymph node.  He lives in a group home.  He is disabled.  Symptomatically, he notes a 40-50 pound weight loss in the past year.  He declined HIDA scan to evaluate RUQ pain.  Plan:   1.   Oncology:  Discussed images with patient in detail.  Discuss concern for primary lung cancer.  Discussed PET scan.  Patient agreeable to outpatient imaging.  Discussed obtaining tissue for biopsy after PET scan.  Patient agreeable.  Preliminary discussions held regarding treatment of lung cancer.  A few questions were asked and answered.  2.  Disposition:  Anticipate completion of work-up in outpatient department.  Thank you for allowing me to participate in Craig Wright 's care.  I will follow him closely with you while hospitalized and after discharge in the outpatient department.   Lequita Asal, MD  01/24/2016

## 2016-01-25 NOTE — Progress Notes (Signed)
Patient ID: Craig Wright, male   DOB: 06/14/1965, 50 y.o.   MRN: [id number]  [place] PROGRESS NOTE  Craig Wright UXN:[id number] DOB: 1965-11-01 DOA: 01/23/2016 PCP: Pcp Not In System  HPI/Subjective: Abdominal pain resolved breathing with improvement  Objective: Vitals:   01/25/16 0453 01/25/16 1405  BP: 124/70 (!) 141/74  Pulse: 63 88  Resp: 16 16  Temp: 98.1 F (36.7 C) 98.1 F (36.7 C)    Filed Weights   01/23/16 1310 01/23/16 2203  Weight: 187 lb (84.8 kg) 157 lb 14.4 oz (71.6 kg)    ROS: Review of Systems  Constitutional: Negative for chills and fever.  Eyes: Negative for blurred vision.  Respiratory: Positive for cough, shortness of breath and wheezing.   Cardiovascular: Negative for chest pain.  Gastrointestinal: Negative for abdominal pain, constipation, diarrhea, nausea and vomiting.  Genitourinary: Negative for dysuria.  Musculoskeletal: Negative for joint pain.  Neurological: Negative for dizziness and headaches.   Exam: Physical Exam  Constitutional: He is oriented to person, place, and time.  HENT:  Nose: No mucosal edema.  Mouth/Throat: No oropharyngeal exudate or posterior oropharyngeal edema.  Eyes: Conjunctivae, EOM and lids are normal. Pupils are equal, round, and reactive to light.  Neck: No JVD present. Carotid bruit is not present. No edema present. No thyroid mass and no thyromegaly present.  Cardiovascular: S1 normal and S2 normal.  Exam reveals no gallop.   No murmur heard. Pulses:      Dorsalis pedis pulses are 2+ on the right side, and 2+ on the left side.  Respiratory: No respiratory distress. He has decreased breath sounds in the right middle field, the right lower field, the left upper field, the left middle field and the left lower field. He has wheezes in the right middle field, the right lower field, the left middle field and the left lower field. He has no rhonchi. He has no rales.  GI: Soft. Bowel sounds are normal.  There is no tenderness.  Musculoskeletal:       Right ankle: He exhibits no swelling.       Left ankle: He exhibits no swelling.  Lymphadenopathy:    He has no cervical adenopathy.  Neurological: He is alert and oriented to person, place, and time. No cranial nerve deficit.  Skin: Skin is warm. No rash noted. Nails show no clubbing.  Psychiatric: He has a normal mood and affect.      Data Reviewed: Basic Metabolic Panel:  Recent Labs Lab 01/23/16 1313  NA 137  K 3.9  CL 95*  CO2 35*  GLUCOSE 142*  BUN 7  CREATININE 0.85  CALCIUM 9.6   Liver Function Tests:  Recent Labs Lab 01/23/16 1313  AST 77*  ALT 69*  ALKPHOS 75  BILITOT 1.1  PROT 7.0  ALBUMIN 3.8    Recent Labs Lab 01/23/16 1313  LIPASE 18   CBC:  Recent Labs Lab 01/23/16 1313  WBC 6.3  HGB 13.7  HCT 40.6  MCV 89.5  PLT 191   Cardiac Enzymes:  Recent Labs Lab 01/23/16 1313 01/23/16 1627  TROPONINI <0.03 <0.03   BNP (last 3 results)  Recent Labs  12/19/15 1754 12/29/15 0134 01/23/16 1313  BNP 60.0 36.0 47.0      Studies: Dg Chest 2 View  Result Date: 01/23/2016 CLINICAL DATA:  Short of breath EXAM: CHEST  2 VIEW COMPARISON:  12/29/2015 FINDINGS: COPD with pulmonary hyperinflation. Cardiac enlargement without heart failure. Negative for infiltrate effusion or mass.  IMPRESSION: No active cardiopulmonary disease. Electronically Signed   By: Franchot Gallo M.D.   On: 01/23/2016 14:34   Ct Angio Chest Pe W And/or Wo Contrast  Result Date: 01/23/2016 CLINICAL DATA:  Increased shortness of breath for 1 week, tachycardia, asymmetric leg swelling, history of COPD, hypertension, coronary artery disease, atrial fibrillation, CHF, smoker EXAM: CT ANGIOGRAPHY CHEST WITH CONTRAST TECHNIQUE: Multidetector CT imaging of the chest was performed using the standard protocol during bolus administration of intravenous contrast. Multiplanar CT image reconstructions and MIPs were obtained to evaluate  the vascular anatomy. CONTRAST:  75 cc Isovue 370 IV COMPARISON:  06/07/2015 FINDINGS: Cardiovascular: Aneurysmal dilatation of the ascending thoracic aorta 4.0 cm diameter image 49. No evidence of aortic dissection. Pulmonary arteries well opacified and patent. No evidence of pulmonary embolism. Minimal pericardial effusion. Mediastinum/Nodes: Question mild diffuse wall thickening of the thoracic esophagus versus artifact from underdistention. Few normal size mediastinal and hilar nodes with a borderline enlarged RIGHT hilar node 10 mm short axis image 52. Base of cervical region unremarkable. Small subcutaneous nodule anterior chest wall LEFT of midline at the level of the aortic arch 17 x 14 mm image 35 not significantly. Lungs/Pleura: Spiculated mass RIGHT lower lobe 2.7 x 1.9 x 2.0 cm containing small focus of central cavitation highly suspicious for a pulmonary neoplasm. This represents interval increase in size since the prior CT when this measured 2.0 cm in greatest size and was thin/air-filled. Question 4 mm scar LEFT lower lobe image 54. Minimal atelectasis at base of lingula. Remaining lungs clear. No infiltrate, pleural effusion or pneumothorax. Upper Abdomen: Questionable gallbladder wall thickening without calcification. No biliary dilatation. Stomach decompressed, with suboptimal assessment of gastric wall thickness. Musculoskeletal: No definite acute osseous findings. Review of the MIP images confirms the above findings. IMPRESSION: No evidence pulmonary embolism. 2.7 x 1.9 x 2.0 cm diameter spiculated mass in RIGHT lower lobe with minimal central cavitation highly suspicious for a primary pulmonary neoplasm. Tissue diagnosis recommended. Single borderline enlarged RIGHT hilar lymph node 10 mm short axis. Questionable gallbladder wall thickening, unable to exclude acute cholecystitis with this appearance ; recommend correlation with LFTs and if clinically indicated can assess by RIGHT upper quadrant  sonography. 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 Recommend annual imaging followup by CTA or MRA. This recommendation follows 2010 ACCF/AHA/AATS/ACR/ASA/SCA/SCAI/SIR/STS/SVM Guidelines for the Diagnosis and Management of Patients with Thoracic Aortic Disease. Circulation. 2010; 121: S854-O270 Electronically Signed   By: Lavonia Dana M.D.   On: 01/23/2016 15:39   US Abdomen Limited Ruq  Result Date: 01/23/2016 CLINICAL DATA:  Abdominal pain EXAM: US ABDOMEN LIMITED - RIGHT UPPER QUADRANT COMPARISON:  12/29/2015 FINDINGS: Gallbladder: The gallbladder wall is thickened measuring 6.6 mm in maximum thickness. No gallstones. Negative sonographic Murphy's sign. Common bile duct: Diameter: 5 mm Liver: No focal lesion identified. Within normal limits in parenchymal echogenicity. IMPRESSION: 1. Diffuse gallbladder wall thickening. The appearance is nonspecific but may reflect 8 calculus cholecystitis. If further imaging is clinically indicated at this time a hepatobiliary scan may be helpful to assess patency of the cystic duct. Electronically Signed   By: Kerby Moors M.D.   On: 01/23/2016 17:16    Scheduled Meds: . amoxicillin-clavulanate  1 tablet Oral Q12H  . buPROPion  100 mg Oral BH-q7a  . clonazePAM  1 mg Oral BID  . diltiazem  240 mg Oral Daily  . DULoxetine  60 mg Oral Daily  . enoxaparin (LOVENOX)  injection  40 mg Subcutaneous Q24H  . furosemide  40 mg Oral Daily  . magnesium oxide  400 mg Oral Daily  . mouth rinse  15 mL Mouth Rinse BID  . metFORMIN  1,000 mg Oral BID WC  . methylPREDNISolone (SOLU-MEDROL) injection  60 mg Intravenous Q24H  . mirtazapine  15 mg Oral QHS  . mometasone-formoterol  2 puff Inhalation BID  . nicotine  21 mg Transdermal Daily  . oxyCODONE  20 mg Oral Q12H  . polyethylene glycol  17 g Oral Daily  . potassium chloride SA  20 mEq Oral BID  . roflumilast  500 mcg Oral Daily     Assessment/Plan:  1. Acute on chronic COPD exacerbation.Slow to improve Continue Solu-Medrol and nebulizer treatments. Patient on empiric antibiotics. 2. Chronic respiratory failure on 5 L of oxygen. Continue his normal oxygen. 3. Right upper quadrant abdominal pain this has resolved. Patient refused HIDA scan. Advance diet to solid food and see what happens. Stop IV fluids. We'll change antibiotics to Augmentin 4. Spiculated lung nodule. Will need outpatient oncology follow-up for PET CT scan.  procedure. 5. Type 2 diabetes mellitus. Sugars will be high on steroids. Continue metformin 6. Tobacco abuse on nicotine patch 7. Chronic pain on oxycodone 8. Anxiety, depression on Xanax and Wellbutrin and Remeron 9. History of CHF stop fluids and continue his Lasix. Currently no signs. 10. History of A. fib on Cardizem  Code Status:     Code Status Orders        Start     Ordered   01/23/16 2204  Do not attempt resuscitation (DNR)  Continuous    Question Answer Comment  In the event of cardiac or respiratory ARREST Do not call a "code blue"   In the event of cardiac or respiratory ARREST Do not perform Intubation, CPR, defibrillation or ACLS   In the event of cardiac or respiratory ARREST Use medication by any route, position, wound care, and other measures to relive pain and suffering. May use oxygen, suction and manual treatment of airway obstruction as needed for comfort.      01/23/16 2203    Code Status History    Date Active Date Inactive Code Status Order ID Comments User Context   12/09/2015 11:40 AM 12/12/2015  4:07 PM Full Code [id number]  Epifanio Lesches, MD ED   10/24/2015  1:10 PM 10/27/2015  2:10 PM DNR [id number]  Gladstone Lighter, MD ED   10/20/2015 10:51 AM 10/23/2015  4:37 PM DNR [id number]  Laverle Hobby, MD Inpatient   10/19/2015  8:55 AM 10/20/2015 10:51 AM Full Code [id number]  Dustin Flock, MD Inpatient   10/18/2015 12:29 PM 10/19/2015  8:55 AM DNR [id number]   Laverle Hobby, MD Inpatient   10/18/2015  8:53 AM 10/18/2015 12:29 PM Full Code [id number]  Loletha Grayer, MD Inpatient   10/15/2015  4:43 PM 10/18/2015  8:53 AM DNR [id number]  Lytle Butte, MD ED   10/15/2015  4:43 PM 10/15/2015  4:43 PM Full Code [id number]  Lytle Butte, MD ED   06/26/2015 10:13 AM 06/28/2015  6:05 PM DNR [id number]  Bettey Costa, MD Inpatient   06/26/2015  6:05 AM 06/26/2015 10:13 AM Full Code [id number]  Saundra Shelling, MD Inpatient   06/08/2015 12:15 AM 06/10/2015  4:32 PM Full Code [id number]  Saundra Shelling, MD Inpatient    Advance Directive Documentation   Flowsheet Row Most Recent Value  Type of Advance Directive  Healthcare Power of [place], Living will, Out  of facility DNR (pink MOST or yellow form)  Pre-existing out of facility DNR order (yellow form or pink MOST form)  Yellow form placed in chart (order not valid for inpatient use)  "MOST" Form in Place?  No data     Family Communication: Mother at the bedside Disposition Plan: Potentially home over the weekend  Consultants:  Gen. surgery  Antibiotics:  Augmentin  Time spent: 28 minutes  [place], [facility]

## 2016-01-26 MED ORDER — AMOXICILLIN-POT CLAVULANATE 875-125 MG PO TABS: 1.0000 | ORAL_TABLET | Freq: Two times a day (BID) | ORAL | 0 refills | Status: AC

## 2016-01-26 NOTE — Progress Notes (Signed)
PT Screen Note  Patient Details Name: KASSON LAMERE MRN: [id number] DOB: 1965/12/22   Cancelled Treatment:     Patient admitted for COPD exacerbation and possible cholecystitis. He is on 5L of O2 at baseline, noted to be at 98% on 5L. Reports no falls, able to ambulate around RN station while maneuvering O2 tank, appears to have L sided leg pain/weakness, but able to change directions and look laterally with no loss of balance. Upon returning to his room, PT noted that there was blood on the floor outside of his room, escorted back to his bed and contacted RN staff, while cleaning hallway with soap and water. Patient began to become verbally abusive with therapist and told therapist to "get the hell outta my room". Given the above PT will discharge from caseload as patient appears at baseline.   Kerman Passey, PT, DPT    01/26/2016, 9:07 AM

## 2016-01-26 NOTE — Discharge Summary (Signed)
[facility] [place], 50 y.o., DOB Jan 23, 1966, MRN [id number]. Admission date: 01/23/2016 Discharge Date 01/26/2016 Primary MD Pcp Not In System Admitting Physician Fritzi Mandes, MD  Admission Diagnosis  Acute acalculous cholecystitis [K81.0] Lung mass [R91.8] COPD exacerbation ([place]) [J44.1] Abdominal pain [R10.9]  Discharge Diagnosis   Active Problems:   SIRS (systemic inflammatory response syndrome) (HCC)   Abdominal pain possibly related to cholecystitis patient refused HIDA scan   Acute on chronic respiratory failure   Acute on chronic COPD exasperation Spiculated lung mass needs outpatient oncology follow-up Atrial fibrillation Anxiety disorder Coronary artery disease Diabetes type 2 Essential hypertension       Hospital Course Craig Wright  is a 50 y.o. male with a known history of Atrial fibrillation, COPD on 5 L of nasal Oxygen with ongoing tobacco abuse, history of CHF, CAD, diabetes and hypertension comes to the emergency room from [facility] home with complaints of increasing shortness of breath and wheezing. He was also complaining of right upper quadrant abdominal pain. Therefore patient was admitted for further evaluation and therapy for his COPD exasperation as well as his abdominal pain. He had a evaluation with ultrasound which suggested possible cholecystitis surgery saw the patient recommended a HIDA scan however patient refused. His abdominal pain subsequently resolved he is not having any fevers WBC count is normal. Therefore he will finish course of antibiotics. Patient's breathing is much improved. And is currently at back to baseline. Patient also was noted to have a spiculated lung mass and was seen by oncology recommends outpatient follow-up and will need a PET scan. Patient is DO NOT RESUSCITATE and has severe lung disease. His prognosis is very poor.           Consults  general surgery,  oncology  Significant Tests:  See full reports for all details    Dg Chest 2 View  Result Date: 01/23/2016 CLINICAL DATA:  Short of breath EXAM: CHEST  2 VIEW COMPARISON:  12/29/2015 FINDINGS: COPD with pulmonary hyperinflation. Cardiac enlargement without heart failure. Negative for infiltrate effusion or mass. IMPRESSION: No active cardiopulmonary disease. Electronically Signed   By: Franchot Gallo M.D.   On: 01/23/2016 14:34   Ct Angio Chest Pe W And/or Wo Contrast  Result Date: 01/23/2016 CLINICAL DATA:  Increased shortness of breath for 1 week, tachycardia, asymmetric leg swelling, history of COPD, hypertension, coronary artery disease, atrial fibrillation, CHF, smoker EXAM: CT ANGIOGRAPHY CHEST WITH CONTRAST TECHNIQUE: Multidetector CT imaging of the chest was performed using the standard protocol during bolus administration of intravenous contrast. Multiplanar CT image reconstructions and MIPs were obtained to evaluate the vascular anatomy. CONTRAST:  75 cc Isovue 370 IV COMPARISON:  06/07/2015 FINDINGS: Cardiovascular: Aneurysmal dilatation of the ascending thoracic aorta 4.0 cm diameter image 49. No evidence of aortic dissection. Pulmonary arteries well opacified and patent. No evidence of pulmonary embolism. Minimal pericardial effusion. Mediastinum/Nodes: Question mild diffuse wall thickening of the thoracic esophagus versus artifact from underdistention. Few normal size mediastinal and hilar nodes with a borderline enlarged RIGHT hilar node 10 mm short axis image 52. Base of cervical region unremarkable. Small subcutaneous nodule anterior chest wall LEFT of midline at the level of the aortic arch 17 x 14 mm image 35 not significantly. Lungs/Pleura: Spiculated mass RIGHT lower lobe 2.7 x 1.9 x 2.0 cm containing small focus of central cavitation highly suspicious for a pulmonary neoplasm. This represents interval increase in size since the prior CT when  this measured 2.0 cm in greatest size and  was thin/air-filled. Question 4 mm scar LEFT lower lobe image 54. Minimal atelectasis at base of lingula. Remaining lungs clear. No infiltrate, pleural effusion or pneumothorax. Upper Abdomen: Questionable gallbladder wall thickening without calcification. No biliary dilatation. Stomach decompressed, with suboptimal assessment of gastric wall thickness. Musculoskeletal: No definite acute osseous findings. Review of the MIP images confirms the above findings. IMPRESSION: No evidence pulmonary embolism. 2.7 x 1.9 x 2.0 cm diameter spiculated mass in RIGHT lower lobe with minimal central cavitation highly suspicious for a primary pulmonary neoplasm. Tissue diagnosis recommended. Single borderline enlarged RIGHT hilar lymph node 10 mm short axis. Questionable gallbladder wall thickening, unable to exclude acute cholecystitis with this appearance ; recommend correlation with LFTs and if clinically indicated can assess by RIGHT upper quadrant sonography. 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 Recommend annual imaging followup by CTA or MRA. This recommendation follows 2010 ACCF/AHA/AATS/ACR/ASA/SCA/SCAI/SIR/STS/SVM Guidelines for the Diagnosis and Management of Patients with Thoracic Aortic Disease. Circulation. 2010; 121: R916-B846 Electronically Signed   By: Lavonia Dana M.D.   On: 01/23/2016 15:39   Dg Chest Port 1 View  Result Date: 12/29/2015 CLINICAL DATA:  Acute onset of shortness of breath. Initial encounter. EXAM: PORTABLE CHEST 1 VIEW COMPARISON:  Chest radiograph performed 12/19/2015 FINDINGS: The lungs are well-aerated and clear. There is no evidence of focal opacification, pleural effusion or pneumothorax. The cardiomediastinal silhouette is within normal limits. No acute osseous abnormalities are seen. Clips are noted overlying the right axilla. IMPRESSION: No acute cardiopulmonary process seen. Electronically Signed   By: Garald Balding M.D.   On: 12/29/2015 01:53   US Abdomen Limited Ruq  Result Date: 01/23/2016 CLINICAL DATA:  Abdominal pain EXAM: US ABDOMEN LIMITED - RIGHT UPPER QUADRANT COMPARISON:  12/29/2015 FINDINGS: Gallbladder: The gallbladder wall is thickened measuring 6.6 mm in maximum thickness. No gallstones. Negative sonographic Murphy's sign. Common bile duct: Diameter: 5 mm Liver: No focal lesion identified. Within normal limits in parenchymal echogenicity. IMPRESSION: 1. Diffuse gallbladder wall thickening. The appearance is nonspecific but may reflect 8 calculus cholecystitis. If further imaging is clinically indicated at this time a hepatobiliary scan may be helpful to assess patency of the cystic duct. Electronically Signed   By: Kerby Moors M.D.   On: 01/23/2016 17:16   US Abdomen Limited Ruq  Result Date: 12/29/2015 CLINICAL DATA:  Right upper quadrant abdominal pain. Elevated bilirubin. EXAM: US ABDOMEN LIMITED - RIGHT UPPER QUADRANT COMPARISON:  Abdominal CT 12/19/2015 FINDINGS: Gallbladder: There is no cholelithiasis. No positive sonographic Percell Miller sign was demonstrated by the sonographer. No pericholecystic fluid. There is mild nonspecific wall thickening. Common bile duct: Diameter: 4 mm Liver: There is a calcified 5 mm lesion at the hepatic dome. There is small volume ascites. The liver contour is mildly lobulated. IMPRESSION: 1. No sonographic evidence of acute cholecystitis. 2. Lobular liver pons 4 with adjacent small volume ascites may indicate hepatic cirrhosis. Electronically Signed   By: Ulyses Jarred M.D.   On: 12/29/2015 06:02       Today   Subjective:   Craig Wright  patient feeling better denies any abdominal pain shortness of breath back to baseline.  Objective:   Blood pressure 124/69, pulse 60, temperature 97.8 F (36.6 C), temperature source Oral, resp. rate 18, height 6' (1.829 m), weight 157 lb 14.4 oz (71.6 kg), SpO2 100 %.  .  Intake/Output Summary (Last 24 hours)  at 01/26/16 1316 Last data filed at 01/26/16 1000  Gross per 24 hour  Intake              840 ml  Output              600 ml  Net              240 ml    Exam VITAL SIGNS: Blood pressure 124/69, pulse 60, temperature 97.8 F (36.6 C), temperature source Oral, resp. rate 18, height 6' (1.829 m), weight 157 lb 14.4 oz (71.6 kg), SpO2 100 %.  GENERAL:  50 y.o.-year-old patient lying in the bed with no acute distress.  EYES: Pupils equal, round, reactive to light and accommodation. No scleral icterus. Extraocular muscles intact.  HEENT: Head atraumatic, normocephalic. Oropharynx and nasopharynx clear.  NECK:  Supple, no jugular venous distention. No thyroid enlargement, no tenderness.  LUNGS: Normal breath sounds bilaterally, no wheezing, rales,rhonchi or crepitation. No use of accessory muscles of respiration.  CARDIOVASCULAR: S1, S2 normal. No murmurs, rubs, or gallops.  ABDOMEN: Soft, nontender, nondistended. Bowel sounds present. No organomegaly or mass.  EXTREMITIES: No pedal edema, cyanosis, or clubbing.  NEUROLOGIC: Cranial nerves II through XII are intact. Muscle strength 5/5 in all extremities. Sensation intact. Gait not checked.  PSYCHIATRIC: The patient is alert and oriented x 3.  SKIN: No obvious rash, lesion, or ulcer.   Data Review     CBC w Diff: Lab Results  Component Value Date   WBC 6.3 01/23/2016   HGB 13.7 01/23/2016   HGB 15.8 12/26/2012   HCT 40.6 01/23/2016   HCT 46.4 12/26/2012   PLT 191 01/23/2016   PLT 205 12/26/2012   LYMPHOPCT 14 12/29/2015   LYMPHOPCT 5.5 07/30/2012   MONOPCT 10 12/29/2015   MONOPCT 1.0 07/30/2012   EOSPCT 1 12/29/2015   EOSPCT 0.0 07/30/2012   BASOPCT 0 12/29/2015   BASOPCT 0.1 07/30/2012   CMP: Lab Results  Component Value Date   NA 137 01/23/2016   NA 132 (L) 12/26/2012   K 3.9 01/23/2016   K 4.4 12/26/2012   CL 95 (L) 01/23/2016   CL 99 12/26/2012   CO2 35 (H) 01/23/2016   CO2 35 (H) 12/26/2012   BUN 7 01/23/2016    BUN 7 12/26/2012   CREATININE 0.85 01/23/2016   CREATININE 0.74 12/26/2012   PROT 7.0 01/23/2016   PROT 7.9 12/26/2012   ALBUMIN 3.8 01/23/2016   ALBUMIN 4.1 12/26/2012   BILITOT 1.1 01/23/2016   BILITOT 0.4 12/26/2012   ALKPHOS 75 01/23/2016   ALKPHOS 69 12/26/2012   AST 77 (H) 01/23/2016   AST 31 12/26/2012   ALT 69 (H) 01/23/2016   ALT 38 12/26/2012  .  Micro Results No results found for this or any previous visit (from the past 240 hour(s)).      Code Status Orders        Start     Ordered   01/23/16 2204  Do not attempt resuscitation (DNR)  Continuous    Question Answer Comment  In the event of cardiac or respiratory ARREST Do not call a "code blue"   In the event of cardiac or respiratory ARREST Do not perform Intubation, CPR, defibrillation or ACLS   In the event of cardiac or respiratory ARREST Use medication by any route, position, wound care, and other measures to relive pain and suffering. May use oxygen, suction and manual treatment of airway obstruction as needed for comfort.  01/23/16 2203    Code Status History    Date Active Date Inactive Code Status Order ID Comments User Context   12/09/2015 11:40 AM 12/12/2015  4:07 PM Full Code [id number]  Epifanio Lesches, MD ED   10/24/2015  1:10 PM 10/27/2015  2:10 PM DNR [id number]  Gladstone Lighter, MD ED   10/20/2015 10:51 AM 10/23/2015  4:37 PM DNR [id number]  Laverle Hobby, MD Inpatient   10/19/2015  8:55 AM 10/20/2015 10:51 AM Full Code [id number]  Dustin Flock, MD Inpatient   10/18/2015 12:29 PM 10/19/2015  8:55 AM DNR [id number]  Laverle Hobby, MD Inpatient   10/18/2015  8:53 AM 10/18/2015 12:29 PM Full Code [id number]  Loletha Grayer, MD Inpatient   10/15/2015  4:43 PM 10/18/2015  8:53 AM DNR [id number]  Lytle Butte, MD ED   10/15/2015  4:43 PM 10/15/2015  4:43 PM Full Code [id number]  Lytle Butte, MD ED   06/26/2015 10:13 AM 06/28/2015  6:05 PM DNR [id number]  Bettey Costa, MD Inpatient   06/26/2015  6:05  AM 06/26/2015 10:13 AM Full Code [id number]  Saundra Shelling, MD Inpatient   06/08/2015 12:15 AM 06/10/2015  4:32 PM Full Code [id number]  Saundra Shelling, MD Inpatient    Advance Directive Documentation   Flowsheet Row Most Recent Value  Type of Advance Directive  Healthcare Power of Attorney, Living will, Out of facility DNR (pink MOST or yellow form)  Pre-existing out of facility DNR order (yellow form or pink MOST form)  Yellow form placed in chart (order not valid for inpatient use)  "MOST" Form in Place?  No data          Follow-up Information    Lequita Asal, MD Follow up in 1 week(s).   Specialty:  Hematology and Oncology Why:  lung spiculated mass Contact information: [place] [id number] [phone]        pcp Follow up in 7 day(s).           Discharge Medications     Medication List    TAKE these medications   acetaminophen 325 MG tablet Commonly known as:  TYLENOL Take 650 mg by mouth every 4 (four) hours as needed.   amoxicillin-clavulanate 875-125 MG tablet Commonly known as:  AUGMENTIN Take 1 tablet by mouth every 12 (twelve) hours.   buPROPion 100 MG 12 hr tablet Commonly known as:  WELLBUTRIN SR Take 100 mg by mouth every morning.   clonazePAM 1 MG tablet Commonly known as:  KLONOPIN Take 1 mg by mouth 2 (two) times daily.   diltiazem 240 MG 24 hr capsule Commonly known as:  CARDIZEM CD Take 1 capsule (240 mg total) by mouth daily.   DULoxetine 60 MG capsule Commonly known as:  CYMBALTA Take 60 mg by mouth daily.   furosemide 40 MG tablet Commonly known as:  LASIX Take 40 mg by mouth daily.   ipratropium-albuterol 0.5-2.5 (3) MG/3ML Soln Commonly known as:  DUONEB Take 3 mLs by nebulization every 4 (four) hours as needed.   magnesium oxide 400 MG tablet Commonly known as:  MAG-OX Take 400 mg by mouth daily.   metFORMIN 500 MG (MOD) 24 hr tablet Commonly known as:  GLUMETZA Take 2 tablets (1,000 mg  total) by mouth 2 (two) times daily. Reported on 06/26/2015   mirtazapine 15 MG tablet Commonly known as:  REMERON Take 15 mg by mouth at bedtime.   mometasone-formoterol 100-5 MCG/ACT Aero Commonly known as:  DULERA Inhale 2 puffs into the lungs 2 (two) times daily.   nicotine 21 mg/24hr patch Commonly known as:  NICODERM CQ - dosed in mg/24 hours Place 1 patch (21 mg total) onto the skin daily.   ondansetron 4 MG disintegrating tablet Commonly known as:  ZOFRAN-ODT Take 4 mg by mouth every 8 (eight) hours as needed for nausea or vomiting.   OXYCONTIN 20 mg 12 hr tablet Generic drug:  oxyCODONE Take 20 mg by mouth every 12 (twelve) hours.   Oxycodone HCl 10 MG Tabs Take 10 mg by mouth every 6 (six) hours as needed.   polyethylene glycol packet Commonly known as:  MIRALAX / GLYCOLAX Take 17 g by mouth daily.   potassium chloride SA 20 MEQ tablet Commonly known as:  K-DUR,KLOR-CON Take 20 mEq by mouth 2 (two) times daily.   roflumilast 500 MCG Tabs tablet Commonly known as:  DALIRESP Take 500 mcg by mouth daily.   SENNA-LAX 8.6 MG tablet Generic drug:  senna Take 2 tablets by mouth daily as needed for constipation.          Total Time in preparing paper work, data evaluation and todays exam - 35 minutes  Dustin Flock M.D on 01/26/2016 at 1:16 PM  [facility]   Office  [phone]

## 2016-01-26 NOTE — Progress Notes (Signed)
Patient alert and oriented; denies pain; meds as ordered; Discharge  this shift; Discharge instructions given as ordered; patient and family voiced understanding; Discharge via w/c with O2 on and intact; denies distress.

## 2016-01-26 NOTE — Clinical Social Work Note (Addendum)
CSW has attempted to contact the facility representative to discuss dc planning and left a voicemail. The CSW will deliver the packet once the representative has confirmed transportation preferences and ability to receive the patient.  Patient was dc to his family. CSW will con't to follow pending need for paperwork to be sent to [facility].  Santiago Bumpers, MSW, LCSW-A [phone]

## 2016-02-26 ENCOUNTER — Inpatient Hospital Stay: Payer: Medicaid Other | Admitting: Hematology and Oncology

## 2016-02-26 ENCOUNTER — Telehealth: Payer: Self-pay | Admitting: *Deleted

## 2016-02-26 DIAGNOSIS — R634 Abnormal weight loss: Secondary | ICD-10-CM | POA: Insufficient documentation

## 2016-02-26 DIAGNOSIS — R918 Other nonspecific abnormal finding of lung field: Secondary | ICD-10-CM | POA: Insufficient documentation

## 2016-02-26 NOTE — Telephone Encounter (Signed)
Patient had hospital f/u appointment Wednesday, Jan. 3 regarding lung mass.  Patient did not show for appointment.  [place] and spoke with Ilsa Iha.  He was not aware of appointment.  Also did not receive new patient package that was mailed.  Rescheduled patient for next Wednesday, Jan. 10 @ 1:30 pm.  Will have scheduler mail new packet to Isaias Sakai, RN [address], [place], [place]  [address].  Mariann Laster is the RN that works in the facility.   Contacted scheduler for appointment and to have new packet mailed.

## 2016-02-26 NOTE — Progress Notes (Deleted)
[facility] day:  02/26/2016  Chief Complaint: BASEM YANNUZZI is a 51 y.o. male with a right lower lobe mass who is seen for follow-up after recent hospitalization.  HPI: The patient was admitted to [facility] from 01/23/2016 - 01/26/2016.  He presented to the ER with increasing shortness of breath and right upper quadrant abdominal pain. He has a greater than 40 pack year smoking history.  He currently smokes 1 pack a day.  He is on 5 liter/min oxygen via nasal cannula at baseline.   Chest CT angiogram on 01/23/2016 revealed no evidence of pulmonary embolism.  There was a 2.7 x 1.9 x 2.0 cm spiculated right lower lobe lung lesion with minimal central cavitation highly suspicious for a primary pulmonary neoplasm.  There was a sngle 1.0 cm enlarged RIGHT hilar lymph node.  RUQ ultrasound on 01/23/2016 revealed diffuse gallbladder wall thickening.  He has been seen by surgery.  He declined HIDA scan.  His abdominal pain resolved spontaneously.  He was treated with solumedrol, nebulizers, and empiric antibiotics (Unasyn).  The patient was seen in consultation on 01/23/2016. We discussed the concern for primary lung cancer.  We discussed an outpatient PET scan.  We discussed obtaining tissue for biopsy after PET scan.    Past Medical History:  Diagnosis Date  . A-fib ([place])   . Anxiety disorder   . Asthma   . CHF (congestive heart failure) ([place])   . COPD (chronic obstructive pulmonary disease) ([place])   . Coronary artery disease   . Diabetes mellitus without complication ([place])   . Hypertension     Past Surgical History:  Procedure Laterality Date  . KNEE SURGERY Left   . ORTHOPEDIC SURGERY     multiple  . SKIN GRAFT    . TOTAL HIP ARTHROPLASTY Left     Family History  Problem Relation Age of Onset  . Cervical cancer Mother   . Colon cancer Father     Social History:  reports that he has been smoking Cigarettes.  He has a 40.00 pack-year  smoking history. He has never used smokeless tobacco. He reports that he does not drink alcohol or use drugs.  The patient lives at [facility] group home in [place].  He states that he has lived there for 1 year, but is unaware of the circumstances "spur of the moment".  His family consists of his mother.  He has no children.  He is disabled.  The patient is accompanied by *** alone today.  Allergies: No Known Allergies  Current Medications: Current Outpatient Prescriptions  Medication Sig Dispense Refill  . acetaminophen (TYLENOL) 325 MG tablet Take 650 mg by mouth every 4 (four) hours as needed.    Marland Kitchen buPROPion (WELLBUTRIN SR) 100 MG 12 hr tablet Take 100 mg by mouth every morning.    . clonazePAM (KLONOPIN) 1 MG tablet Take 1 mg by mouth 2 (two) times daily.    Marland Kitchen diltiazem (CARDIZEM CD) 240 MG 24 hr capsule Take 1 capsule (240 mg total) by mouth daily. 30 capsule 0  . DULoxetine (CYMBALTA) 60 MG capsule Take 60 mg by mouth daily.     . furosemide (LASIX) 40 MG tablet Take 40 mg by mouth daily.     Marland Kitchen ipratropium-albuterol (DUONEB) 0.5-2.5 (3) MG/3ML SOLN Take 3 mLs by nebulization every 4 (four) hours as needed.    . magnesium oxide (MAG-OX) 400 MG tablet Take 400 mg by mouth daily.    Marland Kitchen  metFORMIN (GLUMETZA) 500 MG (MOD) 24 hr tablet Take 2 tablets (1,000 mg total) by mouth 2 (two) times daily. Reported on 06/26/2015 60 tablet 0  . mirtazapine (REMERON) 15 MG tablet Take 15 mg by mouth at bedtime.     . mometasone-formoterol (DULERA) 100-5 MCG/ACT AERO Inhale 2 puffs into the lungs 2 (two) times daily. 1 Inhaler 0  . nicotine (NICODERM CQ - DOSED IN MG/24 HOURS) 21 mg/24hr patch Place 1 patch (21 mg total) onto the skin daily. 28 patch 0  . ondansetron (ZOFRAN-ODT) 4 MG disintegrating tablet Take 4 mg by mouth every 8 (eight) hours as needed for nausea or vomiting.    Marland Kitchen oxyCODONE (OXYCONTIN) 20 mg 12 hr tablet Take 20 mg by mouth every 12 (twelve) hours.    . Oxycodone HCl 10 MG TABS Take 10 mg by  mouth every 6 (six) hours as needed.    . polyethylene glycol (MIRALAX / GLYCOLAX) packet Take 17 g by mouth daily.    . potassium chloride SA (K-DUR,KLOR-CON) 20 MEQ tablet Take 20 mEq by mouth 2 (two) times daily.    . roflumilast (DALIRESP) 500 MCG TABS tablet Take 500 mcg by mouth daily.    Marland Kitchen senna (SENNA-LAX) 8.6 MG tablet Take 2 tablets by mouth daily as needed for constipation.     No current facility-administered medications for this visit.     Review of Systems:  GENERAL:  Feels "ok".  No fevers or sweats.  Weight loss of 40-50 pounds in past year. PERFORMANCE STATUS (ECOG): 2 HEENT:  No visual changes, runny nose, sore throat, mouth sores or tenderness. Lungs: Shortness of breath.  Cough clear.  No hemoptysis.  No history of TB or exposure. Cardiac:  No chest pain, palpitations, orthopnea, or PND. GI:  Upper abdominal pain, improved.  Fair appetite.  No nausea, vomiting, diarrhea, constipation, melena or hematochezia. GU:  No urgency, frequency, dysuria, or hematuria. Musculoskeletal:  No back pain.  No joint pain.  No muscle tenderness. Extremities:  No pain or swelling. Skin:  No rashes or skin changes. Neuro:  No headache, numbness or weakness, balance or coordination issues. Endocrine:  No diabetes, thyroid issues, hot flashes or night sweats. Psych:  No mood changes, depression or anxiety. Pain:  No focal pain. Review of systems:  All other systems reviewed and   Physical Exam: There were no vitals taken for this visit. GENERAL:  Well developed, well nourished, gentleman sitting comfortably in the exam room in no acute distress.  He avoids eye contact. MENTAL STATUS:  Alert and oriented to person, place and time. HEAD: Short hair.  Mustache. Normocephalic, atraumatic, face symmetric, no Cushingoid features. EYES:  Pupils equal round and reactive to light and accomodation.  No conjunctivitis or scleral icterus. ENT:  Nasal cannula in place. RESPIRATORY:  Poor  respiratory excursion.  Diffuse soft wheezes.  No rales or rhonchi. CARDIOVASCULAR:  Regular rate and rhythm without murmur, rub or gallop. ABDOMEN:  Soft, non-tender, with active bowel sounds, and no hepatosplenomegaly.  No masses. SKIN:  Tattoos.  No rashes, ulcers or lesions. EXTREMITIES: Right ankle edema.  No skin discoloration or tenderness.  No palpable cords. LYMPH NODES: No palpable cervical, supraclavicular, axillary or inguinal adenopathy  NEUROLOGICAL: Unremarkable.  Moves all 4 extremities. PSYCH:  Avoids eye contact.   No visits with results within 3 Day(s) from this visit.  Latest known visit with results is:  Admission on 01/23/2016, Discharged on 01/26/2016  Component Date Value Ref Range Status  .  Sodium 01/23/2016 137  135 - 145 mmol/L Final  . Potassium 01/23/2016 3.9  3.5 - 5.1 mmol/L Final  . Chloride 01/23/2016 95* 101 - 111 mmol/L Final  . CO2 01/23/2016 35* 22 - 32 mmol/L Final  . Glucose, Bld 01/23/2016 142* 65 - 99 mg/dL Final  . BUN 01/23/2016 7  6 - 20 mg/dL Final  . Creatinine, Ser 01/23/2016 0.85  0.61 - 1.24 mg/dL Final  . Calcium 01/23/2016 9.6  8.9 - 10.3 mg/dL Final  . GFR calc non Af Amer 01/23/2016 >60  >60 mL/min Final  . GFR calc Af Amer 01/23/2016 >60  >60 mL/min Final   Comment: (NOTE) The eGFR has been calculated using the CKD EPI equation. This calculation has not been validated in all clinical situations. eGFR's persistently <60 mL/min signify possible Chronic Kidney Disease.   . Anion gap 01/23/2016 7  5 - 15 Final  . WBC 01/23/2016 6.3  3.8 - 10.6 K/uL Final  . RBC 01/23/2016 4.54  4.40 - 5.90 MIL/uL Final  . Hemoglobin 01/23/2016 13.7  13.0 - 18.0 g/dL Final  . HCT 01/23/2016 40.6  40.0 - 52.0 % Final  . MCV 01/23/2016 89.5  80.0 - 100.0 fL Final  . MCH 01/23/2016 30.3  26.0 - 34.0 pg Final  . MCHC 01/23/2016 33.9  32.0 - 36.0 g/dL Final  . RDW 01/23/2016 15.1* 11.5 - 14.5 % Final  . Platelets 01/23/2016 191  150 - 440 K/uL Final   . Troponin I 01/23/2016 <0.03  <0.03 ng/mL Final  . B Natriuretic Peptide 01/23/2016 47.0  0.0 - 100.0 pg/mL Final  . Influenza A By PCR 01/23/2016 NEGATIVE  NEGATIVE Final  . Influenza B By PCR 01/23/2016 NEGATIVE  NEGATIVE Final   Comment: (NOTE) The Xpert Xpress Flu assay is intended as an aid in the diagnosis of  influenza and should not be used as a sole basis for treatment.  This  assay is FDA approved for nasopharyngeal swab specimens only. Nasal  washings and aspirates are unacceptable for Xpert Xpress Flu testing.   . Troponin I 01/23/2016 <0.03  <0.03 ng/mL Final  . Total Protein 01/23/2016 7.0  6.5 - 8.1 g/dL Final  . Albumin 01/23/2016 3.8  3.5 - 5.0 g/dL Final  . AST 01/23/2016 77* 15 - 41 U/L Final  . ALT 01/23/2016 69* 17 - 63 U/L Final  . Alkaline Phosphatase 01/23/2016 75  38 - 126 U/L Final  . Total Bilirubin 01/23/2016 1.1  0.3 - 1.2 mg/dL Final  . Bilirubin, Direct 01/23/2016 0.4  0.1 - 0.5 mg/dL Final  . Indirect Bilirubin 01/23/2016 0.7  0.3 - 0.9 mg/dL Final  . Lipase 01/23/2016 18  11 - 51 U/L Final    Assessment:  MUHAMMED TEUTSCH is a 51 y.o. male with a right lower lobe lung mass.  He has a 40 pack year smoking history and is on chronic oxygen via [place] at 5 liters/min.  Chest CT angiogram on 01/23/2016 revealed no evidence of pulmonary embolism.  There was a 2.7 x 1.9 x 2.0 cm spiculated right lower lobe lung lesion with minimal central cavitation highly suspicious for a primary pulmonary neoplasm.  There was a sngle 1.0 cm enlarged RIGHT hilar lymph node.  He lives in a group home.  He is disabled.  He has a 40-50 pound weight loss in the past yest.  Symptomatically, he  Code status is DNR.  Plan: 1.  Review imaging from recent hospitalization.  1. ***  2. *** 3. *** 4. ***  Lequita Asal, MD  02/26/2016, 3:48 AM

## 2016-03-04 ENCOUNTER — Encounter: Payer: Self-pay | Admitting: Hematology and Oncology

## 2016-03-04 ENCOUNTER — Inpatient Hospital Stay: Payer: Medicaid Other | Attending: Hematology and Oncology | Admitting: Hematology and Oncology

## 2016-03-04 VITALS — BP 130/93 | HR 123 | Temp 98.1°F | Resp 18 | Ht 72.0 in | Wt 169.5 lb

## 2016-03-04 DIAGNOSIS — M545 Low back pain: Secondary | ICD-10-CM | POA: Insufficient documentation

## 2016-03-04 DIAGNOSIS — M25569 Pain in unspecified knee: Secondary | ICD-10-CM | POA: Insufficient documentation

## 2016-03-04 DIAGNOSIS — I4891 Unspecified atrial fibrillation: Secondary | ICD-10-CM

## 2016-03-04 DIAGNOSIS — J449 Chronic obstructive pulmonary disease, unspecified: Secondary | ICD-10-CM | POA: Diagnosis not present

## 2016-03-04 DIAGNOSIS — Z79899 Other long term (current) drug therapy: Secondary | ICD-10-CM | POA: Diagnosis not present

## 2016-03-04 DIAGNOSIS — I2510 Atherosclerotic heart disease of native coronary artery without angina pectoris: Secondary | ICD-10-CM | POA: Insufficient documentation

## 2016-03-04 DIAGNOSIS — E119 Type 2 diabetes mellitus without complications: Secondary | ICD-10-CM | POA: Diagnosis not present

## 2016-03-04 DIAGNOSIS — R634 Abnormal weight loss: Secondary | ICD-10-CM | POA: Diagnosis not present

## 2016-03-04 DIAGNOSIS — F17210 Nicotine dependence, cigarettes, uncomplicated: Secondary | ICD-10-CM | POA: Insufficient documentation

## 2016-03-04 DIAGNOSIS — R918 Other nonspecific abnormal finding of lung field: Secondary | ICD-10-CM | POA: Diagnosis present

## 2016-03-04 DIAGNOSIS — M25559 Pain in unspecified hip: Secondary | ICD-10-CM | POA: Insufficient documentation

## 2016-03-04 DIAGNOSIS — Z7984 Long term (current) use of oral hypoglycemic drugs: Secondary | ICD-10-CM | POA: Diagnosis not present

## 2016-03-04 DIAGNOSIS — G8929 Other chronic pain: Secondary | ICD-10-CM | POA: Insufficient documentation

## 2016-03-04 DIAGNOSIS — Z66 Do not resuscitate: Secondary | ICD-10-CM

## 2016-03-04 DIAGNOSIS — Z9981 Dependence on supplemental oxygen: Secondary | ICD-10-CM

## 2016-03-04 DIAGNOSIS — I110 Hypertensive heart disease with heart failure: Secondary | ICD-10-CM

## 2016-03-04 DIAGNOSIS — F419 Anxiety disorder, unspecified: Secondary | ICD-10-CM | POA: Insufficient documentation

## 2016-03-04 DIAGNOSIS — I509 Heart failure, unspecified: Secondary | ICD-10-CM | POA: Insufficient documentation
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
## 2016-03-04 NOTE — Progress Notes (Signed)
Patient here today as hospital follow up regarding lung mass.  HR 128  Recheck HR 123 - Patient states his HR is always elevated.

## 2016-03-12 ENCOUNTER — Ambulatory Visit: Payer: Medicaid Other

## 2016-03-12 ENCOUNTER — Inpatient Hospital Stay: Payer: Medicaid Other

## 2016-03-16 ENCOUNTER — Ambulatory Visit: Payer: Medicaid Other

## 2016-03-18 ENCOUNTER — Ambulatory Visit: Payer: Medicaid Other | Admitting: Hematology and Oncology

## 2016-03-19 ENCOUNTER — Inpatient Hospital Stay: Payer: Medicaid Other

## 2016-03-20 DIAGNOSIS — I712 Thoracic aortic aneurysm, without rupture: Secondary | ICD-10-CM | POA: Insufficient documentation

## 2016-03-20 DIAGNOSIS — R911 Solitary pulmonary nodule: Secondary | ICD-10-CM | POA: Insufficient documentation

## 2016-03-20 DIAGNOSIS — I7121 Aneurysm of the ascending aorta, without rupture: Secondary | ICD-10-CM | POA: Insufficient documentation

## 2016-03-23 ENCOUNTER — Ambulatory Visit
Admission: RE | Admit: 2016-03-23 | Discharge: 2016-03-23 | Disposition: A | Discharge status: Home / Self Care | Payer: Medicaid Other | Source: Ambulatory Visit | Attending: Hematology and Oncology | Admitting: Hematology and Oncology

## 2016-03-23 DIAGNOSIS — C771 Secondary and unspecified malignant neoplasm of intrathoracic lymph nodes: Secondary | ICD-10-CM | POA: Insufficient documentation

## 2016-03-23 DIAGNOSIS — J439 Emphysema, unspecified: Secondary | ICD-10-CM | POA: Diagnosis not present

## 2016-03-23 DIAGNOSIS — R918 Other nonspecific abnormal finding of lung field: Secondary | ICD-10-CM | POA: Diagnosis present

## 2016-03-23 LAB — GLUCOSE, CAPILLARY: Glucose-Capillary: 110 mg/dL — ABNORMAL HIGH (ref 65–99)

## 2016-03-23 MED ORDER — FLUDEOXYGLUCOSE F - 18 (FDG) INJECTION
13.0900 | Freq: Once | INTRAVENOUS | Status: AC | PRN
  Administered 2016-03-23: 13.09 via INTRAVENOUS

## 2016-03-25 ENCOUNTER — Telehealth: Payer: Self-pay | Admitting: Interventional Radiology

## 2016-03-25 ENCOUNTER — Encounter: Payer: Self-pay | Admitting: Hematology and Oncology

## 2016-03-25 ENCOUNTER — Inpatient Hospital Stay (HOSPITAL_BASED_OUTPATIENT_CLINIC_OR_DEPARTMENT_OTHER): Payer: Medicaid Other | Admitting: Hematology and Oncology

## 2016-03-25 ENCOUNTER — Ambulatory Visit: Payer: Medicaid Other | Admitting: Hematology and Oncology

## 2016-03-25 VITALS — BP 130/82 | HR 121 | Temp 96.6°F | Resp 18 | Wt 164.9 lb

## 2016-03-25 DIAGNOSIS — F17210 Nicotine dependence, cigarettes, uncomplicated: Secondary | ICD-10-CM

## 2016-03-25 DIAGNOSIS — I110 Hypertensive heart disease with heart failure: Secondary | ICD-10-CM

## 2016-03-25 DIAGNOSIS — I509 Heart failure, unspecified: Secondary | ICD-10-CM

## 2016-03-25 DIAGNOSIS — Z9981 Dependence on supplemental oxygen: Secondary | ICD-10-CM | POA: Diagnosis not present

## 2016-03-25 DIAGNOSIS — E119 Type 2 diabetes mellitus without complications: Secondary | ICD-10-CM

## 2016-03-25 DIAGNOSIS — Z79899 Other long term (current) drug therapy: Secondary | ICD-10-CM

## 2016-03-25 DIAGNOSIS — M545 Low back pain: Secondary | ICD-10-CM

## 2016-03-25 DIAGNOSIS — G8929 Other chronic pain: Secondary | ICD-10-CM

## 2016-03-25 DIAGNOSIS — I4891 Unspecified atrial fibrillation: Secondary | ICD-10-CM

## 2016-03-25 DIAGNOSIS — R918 Other nonspecific abnormal finding of lung field: Secondary | ICD-10-CM | POA: Diagnosis not present

## 2016-03-25 DIAGNOSIS — J449 Chronic obstructive pulmonary disease, unspecified: Secondary | ICD-10-CM

## 2016-03-25 DIAGNOSIS — F419 Anxiety disorder, unspecified: Secondary | ICD-10-CM

## 2016-03-25 DIAGNOSIS — Z7984 Long term (current) use of oral hypoglycemic drugs: Secondary | ICD-10-CM

## 2016-03-25 DIAGNOSIS — R634 Abnormal weight loss: Secondary | ICD-10-CM

## 2016-03-25 DIAGNOSIS — I2510 Atherosclerotic heart disease of native coronary artery without angina pectoris: Secondary | ICD-10-CM

## 2016-03-25 DIAGNOSIS — M25569 Pain in unspecified knee: Secondary | ICD-10-CM

## 2016-03-25 DIAGNOSIS — M25559 Pain in unspecified hip: Secondary | ICD-10-CM

## 2016-03-25 DIAGNOSIS — Z66 Do not resuscitate: Secondary | ICD-10-CM

## 2016-03-25 NOTE — Progress Notes (Signed)
Patient offers no complaints today. 
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
## 2016-03-25 NOTE — Telephone Encounter (Signed)
Reviewed PET over phone with Dr. Mike Gip. Rec consult pulmonary as the RLL lesion may be  accessible for EMB bronch biopsy. Percutanous approach is higher risk but also available.

## 2016-03-26 ENCOUNTER — Other Ambulatory Visit: Payer: Self-pay | Admitting: *Deleted

## 2016-03-26 ENCOUNTER — Encounter: Payer: Self-pay | Admitting: Internal Medicine

## 2016-03-26 ENCOUNTER — Other Ambulatory Visit: Payer: Self-pay | Admitting: Hematology and Oncology

## 2016-03-26 ENCOUNTER — Ambulatory Visit (INDEPENDENT_AMBULATORY_CARE_PROVIDER_SITE_OTHER): Payer: Medicaid Other | Admitting: Internal Medicine

## 2016-03-26 ENCOUNTER — Encounter
Admission: RE | Admit: 2016-03-26 | Discharge: 2016-03-26 | Disposition: A | Discharge status: Home / Self Care | Payer: Medicaid Other | Source: Ambulatory Visit | Attending: Internal Medicine | Admitting: Internal Medicine

## 2016-03-26 VITALS — BP 148/88 | HR 120 | Wt 165.0 lb

## 2016-03-26 DIAGNOSIS — R918 Other nonspecific abnormal finding of lung field: Secondary | ICD-10-CM

## 2016-03-26 DIAGNOSIS — I10 Essential (primary) hypertension: Secondary | ICD-10-CM | POA: Insufficient documentation

## 2016-03-26 DIAGNOSIS — Z01812 Encounter for preprocedural laboratory examination: Secondary | ICD-10-CM | POA: Diagnosis present

## 2016-03-26 DIAGNOSIS — Z01810 Encounter for preprocedural cardiovascular examination: Secondary | ICD-10-CM | POA: Insufficient documentation

## 2016-03-26 LAB — CBC
HCT: 36.5 % — ABNORMAL LOW (ref 40.0–52.0)
HEMOGLOBIN: 12.7 g/dL — AB (ref 13.0–18.0)
MCH: 30.2 pg (ref 26.0–34.0)
MCHC: 34.8 g/dL (ref 32.0–36.0)
MCV: 86.8 fL (ref 80.0–100.0)
Platelets: 178 10*3/uL (ref 150–440)
RBC: 4.21 MIL/uL — AB (ref 4.40–5.90)
RDW: 13.6 % (ref 11.5–14.5)
WBC: 5.8 10*3/uL (ref 3.8–10.6)

## 2016-03-26 LAB — BASIC METABOLIC PANEL
Anion gap: 7 (ref 5–15)
BUN: 6 mg/dL (ref 6–20)
CHLORIDE: 97 mmol/L — AB (ref 101–111)
CO2: 32 mmol/L (ref 22–32)
Calcium: 9.3 mg/dL (ref 8.9–10.3)
Creatinine, Ser: 0.69 mg/dL (ref 0.61–1.24)
GFR calc non Af Amer: >60 mL/min (ref >60)
Glucose, Bld: 96 mg/dL (ref 65–99)
POTASSIUM: 4.2 mmol/L (ref 3.5–5.1)
SODIUM: 136 mmol/L (ref 135–145)
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
## 2016-03-26 NOTE — Patient Instructions (Signed)
Your procedure is scheduled on: Monday 03/30/16 Report to [place]. 2ND FLOOR MEDICAL MALL ENTRANCE. To find out your arrival time please call [phone] between 1PM - 3PM on Friday 03/27/16.  Remember: Instructions that are not followed completely may result in serious medical risk, up to and including death, or upon the discretion of your surgeon and anesthesiologist your surgery may need to be rescheduled.    __X__ 1. Do not eat food or drink liquids after midnight. No gum chewing or hard candies.     __X__ 2. No Alcohol for 24 hours before or after surgery.   ____ 3. Bring all medications with you on the day of surgery if instructed.    __X__ 4. Notify your doctor if there is any change in your medical condition     (cold, fever, infections).             __X___5. No smoking within 24 hours of your surgery.     Do not wear jewelry, make-up, hairpins, clips or nail polish.  Do not wear lotions, powders, or perfumes.   Do not shave 48 hours prior to surgery. Men may shave face and neck.  Do not bring valuables to the hospital.    [facility] is not responsible for any belongings or valuables.               Contacts, dentures or bridgework may not be worn into surgery.  Leave your suitcase in the car. After surgery it may be brought to your room.  For patients admitted to the hospital, discharge time is determined by your                treatment team.   Patients discharged the day of surgery will not be allowed to drive home.   Please read over the following fact sheets that you were given:   MRSA Information   __X__ Take these medicines the morning of surgery with A SIP OF WATER:    1. BUPROPION  2. CLONAZEPAM  3. DILTIAZEM  4. DULOXETINE  5. MAGNESIUM  6. OXYCONTIN  7. DALIRESP  ____ Fleet Enema (as directed)   ____ Use CHG Soap as directed  __X__ Use inhalers on the day of surgery AND NEBULIZER  __X__ Stop metformin 2 days prior to surgery    ____ Take 1/2 of  usual insulin dose the night before surgery and none on the morning of surgery.   ____ Stop Coumadin/Plavix/aspirin on   __X__ Stop Anti-inflammatories such as Advil, Aleve, Ibuprofen, Motrin, Naproxen, Naprosyn, Goodies,powder, or aspirin products.  OK to take Tylenol.   ____ Stop supplements until after surgery.    __X__ Bring C-Pap to the hospital.

## 2016-03-26 NOTE — Progress Notes (Signed)
Open in error

## 2016-03-26 NOTE — Patient Instructions (Signed)
  The Risks and Benefits of the Bronchoscopy with EBUS/ENB were explained to patient/family and I have discussed the risk for acute bleeding, increased chance of infection, increased chance of respiratory failure and cardiac arrest and death. I have also explained to avoid all types of NSAIDs to decrease chance of bleeding, and to avoid food and drinks the midnight prior to procedure.  The procedure consists of a video camera with a light source to be placed and inserted  into the lungs to  look for abnormal tissue and to obtain tissue samples by using needle and biopsy tools.  The patient/family understand the risks and benefits and have agreed to proceed with procedure.

## 2016-03-27 ENCOUNTER — Telehealth: Payer: Self-pay | Admitting: Internal Medicine

## 2016-03-27 ENCOUNTER — Telehealth: Payer: Self-pay | Admitting: *Deleted

## 2016-03-27 NOTE — Telephone Encounter (Signed)
Called patient's mother to inform her that CT biopsy is scheduled for Wednesday, February 7th @ 11:30.  Patient should arrive at 10:30.  NPO 8 hours prior.  If patient takes heart or BP meds, okay to take with a sip of water.  Do not take diabetic medications.  Verbalized understanding.

## 2016-03-27 NOTE — Telephone Encounter (Signed)
Called patient's mother to inform her that biopsy has not been scheduled due to not having clearance from radiology at this time.  We are working on this and hoping to have it scheduled by the end of next week.  Told her we would call her back as soon as we have the appointment.  She verbalized understanding and is in agreement with plan.

## 2016-03-27 NOTE — Telephone Encounter (Signed)
Patient mom calling about procedure she thought was going to be scheduled.

## 2016-03-27 NOTE — Telephone Encounter (Signed)
States she was to get a call from Dr Mike Gip about what is to be done and she has not heard anything from her. Please return her call [phone] to discuss biopsy plans after case conference yesterday

## 2016-03-27 NOTE — Telephone Encounter (Signed)
Spoke with mom and she states they have heard nothing from Dr. Danella Deis. Gave Burgess Estelle' number to mom so she can call. Raquel Sarna is aware. Nothing further needed.

## 2016-03-30 ENCOUNTER — Encounter: Admission: RE | Payer: Self-pay | Source: Ambulatory Visit

## 2016-03-30 ENCOUNTER — Ambulatory Visit: Admission: RE | Admit: 2016-03-30 | Payer: Medicaid Other | Source: Ambulatory Visit | Admitting: Internal Medicine

## 2016-03-30 SURGERY — ELECTROMAGNETIC NAVIGATION BRONCHOSCOPY
Anesthesia: Choice

## 2016-03-31 ENCOUNTER — Other Ambulatory Visit: Payer: Self-pay | Admitting: Radiology

## 2016-04-01 ENCOUNTER — Ambulatory Visit
Admission: RE | Admit: 2016-04-01 | Discharge: 2016-04-01 | Disposition: A | Discharge status: Home / Self Care | Payer: Medicaid Other | Source: Ambulatory Visit | Attending: Hematology and Oncology | Admitting: Hematology and Oncology

## 2016-04-01 ENCOUNTER — Ambulatory Visit
Admission: RE | Admit: 2016-04-01 | Discharge: 2016-04-01 | Disposition: A | Discharge status: Home / Self Care | Payer: Medicaid Other | Source: Ambulatory Visit | Attending: Interventional Radiology | Admitting: Interventional Radiology

## 2016-04-01 DIAGNOSIS — I110 Hypertensive heart disease with heart failure: Secondary | ICD-10-CM | POA: Diagnosis not present

## 2016-04-01 DIAGNOSIS — Z9889 Other specified postprocedural states: Secondary | ICD-10-CM | POA: Diagnosis not present

## 2016-04-01 DIAGNOSIS — R918 Other nonspecific abnormal finding of lung field: Secondary | ICD-10-CM | POA: Diagnosis present

## 2016-04-01 DIAGNOSIS — C3431 Malignant neoplasm of lower lobe, right bronchus or lung: Secondary | ICD-10-CM | POA: Insufficient documentation

## 2016-04-01 DIAGNOSIS — J95811 Postprocedural pneumothorax: Secondary | ICD-10-CM | POA: Insufficient documentation

## 2016-04-01 DIAGNOSIS — Z79899 Other long term (current) drug therapy: Secondary | ICD-10-CM | POA: Insufficient documentation

## 2016-04-01 DIAGNOSIS — Z7984 Long term (current) use of oral hypoglycemic drugs: Secondary | ICD-10-CM | POA: Diagnosis not present

## 2016-04-01 DIAGNOSIS — Y848 Other medical procedures as the cause of abnormal reaction of the patient, or of later complication, without mention of misadventure at the time of the procedure: Secondary | ICD-10-CM | POA: Diagnosis not present

## 2016-04-01 DIAGNOSIS — E119 Type 2 diabetes mellitus without complications: Secondary | ICD-10-CM | POA: Insufficient documentation

## 2016-04-01 DIAGNOSIS — I4891 Unspecified atrial fibrillation: Secondary | ICD-10-CM | POA: Insufficient documentation

## 2016-04-01 DIAGNOSIS — I2510 Atherosclerotic heart disease of native coronary artery without angina pectoris: Secondary | ICD-10-CM | POA: Diagnosis not present

## 2016-04-01 DIAGNOSIS — J449 Chronic obstructive pulmonary disease, unspecified: Secondary | ICD-10-CM | POA: Insufficient documentation

## 2016-04-01 DIAGNOSIS — I509 Heart failure, unspecified: Secondary | ICD-10-CM | POA: Diagnosis not present

## 2016-04-01 DIAGNOSIS — F17210 Nicotine dependence, cigarettes, uncomplicated: Secondary | ICD-10-CM | POA: Diagnosis not present

## 2016-04-01 LAB — CBC WITH DIFFERENTIAL/PLATELET
BASOS PCT: 1 %
Basophils Absolute: 0 10*3/uL (ref 0–0.1)
EOS ABS: 0.1 10*3/uL (ref 0–0.7)
Eosinophils Relative: 2 %
HCT: 36.7 % — ABNORMAL LOW (ref 40.0–52.0)
Hemoglobin: 12.4 g/dL — ABNORMAL LOW (ref 13.0–18.0)
Lymphocytes Relative: 27 %
Lymphs Abs: 1.3 10*3/uL (ref 1.0–3.6)
MCH: 29.4 pg (ref 26.0–34.0)
MCHC: 33.9 g/dL (ref 32.0–36.0)
MCV: 86.8 fL (ref 80.0–100.0)
MONO ABS: 0.6 10*3/uL (ref 0.2–1.0)
MONOS PCT: 11 %
Neutro Abs: 2.9 10*3/uL (ref 1.4–6.5)
Neutrophils Relative %: 59 %
Platelets: 233 10*3/uL (ref 150–440)
RBC: 4.23 MIL/uL — ABNORMAL LOW (ref 4.40–5.90)
RDW: 13.3 % (ref 11.5–14.5)
WBC: 4.9 10*3/uL (ref 3.8–10.6)

## 2016-04-01 LAB — BASIC METABOLIC PANEL
Anion gap: 7 (ref 5–15)
BUN: 5 mg/dL — AB (ref 6–20)
CO2: 33 mmol/L — AB (ref 22–32)
CREATININE: 0.7 mg/dL (ref 0.61–1.24)
Calcium: 9.4 mg/dL (ref 8.9–10.3)
Chloride: 99 mmol/L — ABNORMAL LOW (ref 101–111)
GFR calc non Af Amer: >60 mL/min (ref >60)
GLUCOSE: 147 mg/dL — AB (ref 65–99)
Potassium: 3.9 mmol/L (ref 3.5–5.1)
Sodium: 139 mmol/L (ref 135–145)

## 2016-04-01 LAB — PROTIME-INR
INR: 0.99
PROTHROMBIN TIME: 13.1 s (ref 11.4–15.2)

## 2016-04-01 MED ORDER — SODIUM CHLORIDE 0.9 % IV SOLN
INTRAVENOUS | Status: DC
  Administered 2016-04-01: 12:00:00 via INTRAVENOUS

## 2016-04-01 MED ORDER — MIDAZOLAM HCL 5 MG/5ML IJ SOLN
INTRAMUSCULAR | Status: AC
  Filled 2016-04-01: qty 5

## 2016-04-01 MED ORDER — ACETAMINOPHEN 325 MG PO TABS
ORAL_TABLET | ORAL | Status: AC
  Administered 2016-04-01: 325 mg via ORAL
  Filled 2016-04-01: qty 1

## 2016-04-01 MED ORDER — FENTANYL CITRATE (PF) 100 MCG/2ML IJ SOLN
INTRAMUSCULAR | Status: AC
  Filled 2016-04-01: qty 4

## 2016-04-01 MED ORDER — FENTANYL CITRATE (PF) 100 MCG/2ML IJ SOLN
INTRAMUSCULAR | Status: AC | PRN
  Administered 2016-04-01: 25 ug via INTRAVENOUS

## 2016-04-01 MED ORDER — ACETAMINOPHEN 325 MG PO TABS
325.0000 mg | ORAL_TABLET | Freq: Once | ORAL | Status: AC
  Administered 2016-04-01: 325 mg via ORAL
  Filled 2016-04-01: qty 1

## 2016-04-01 MED ORDER — MIDAZOLAM HCL 5 MG/5ML IJ SOLN
INTRAMUSCULAR | Status: AC | PRN
  Administered 2016-04-01: 0.5 mg via INTRAVENOUS

## 2016-04-01 NOTE — Procedures (Signed)
Technically successful CT guided biopsy of hypermetabolic right lower lobe pulmonary mass  EBL: None.  Procedure complicated by development of a trace non-enlarging PTX.  Ronny Bacon, MD Pager #: [phone]

## 2016-04-01 NOTE — Consult Note (Signed)
Chief Complaint: Indeterminate right lower lobe pulmonary nodule  Referring Physician(s): [facility]  Patient Status: [facility] - Out-pt  History of Present Illness: Craig Wright is a 51 y.o. male with past medical history significant for CHF, atrial fibrillation, COPD, CAD, diabetes and hypertension who was found to have a indeterminate mass within the right lower lobe on chest CT performed 01/22/2017. This mass was found to be hypermetabolic on subsequent PET/CT performed 03/23/2016. As such, patient presents today for CT-guided right lower lobe pulmonary mass biopsy. He is accompanied by his mother though serves as his own historian.  Patient is currently without complaint. He is at his baseline shortness of breath for which he is on continuous 5 L of oxygen nasal cannula. His shortness of breath has not worsened recently. No cough. No hemoptysis. No fever or chills. No unintentional weight loss or weight gain. No change in bowel or bladder functions. No chest pain.  Past Medical History:  Diagnosis Date  . A-fib ([place])   . Anxiety disorder   . Asthma   . CHF (congestive heart failure) ([place])   . COPD (chronic obstructive pulmonary disease) ([place])   . Coronary artery disease   . Diabetes mellitus without complication ([place])   . Hypertension     Past Surgical History:  Procedure Laterality Date  . KNEE SURGERY Left   . ORTHOPEDIC SURGERY     multiple  . SKIN GRAFT    . TOTAL HIP ARTHROPLASTY Left     Allergies: Tape  Medications: Prior to Admission medications   Medication Sig Start Date End Date Taking? Authorizing Provider  buPROPion (WELLBUTRIN SR) 100 MG 12 hr tablet Take 100 mg by mouth every morning.   Yes Historical Provider, MD  clonazePAM (KLONOPIN) 1 MG tablet Take 1 mg by mouth 2 (two) times daily.   Yes Historical Provider, MD  diltiazem (CARDIZEM CD) 240 MG 24 hr capsule Take 1 capsule (240 mg total) by mouth daily. 12/13/15  Yes Nicholes Mango, MD    DULoxetine (CYMBALTA) 60 MG capsule Take 60 mg by mouth daily.    Yes Historical Provider, MD  Ipratropium-Albuterol (COMBIVENT) 20-100 MCG/ACT AERS respimat Inhale 1 puff into the lungs every 4 (four) hours as needed for wheezing.  12/20/15  Yes Historical Provider, MD  ipratropium-albuterol (DUONEB) 0.5-2.5 (3) MG/3ML SOLN Take 3 mLs by nebulization every 4 (four) hours as needed (for wheezing/shortness of breath).    Yes Historical Provider, MD  magnesium oxide (MAG-OX) 400 MG tablet Take 400 mg by mouth daily.   Yes Historical Provider, MD  metFORMIN (GLUMETZA) 500 MG (MOD) 24 hr tablet Take 2 tablets (1,000 mg total) by mouth 2 (two) times daily. Reported on 06/26/2015 10/27/15  Yes Dustin Flock, MD  mirtazapine (REMERON) 45 MG tablet Take 45 mg by mouth at bedtime.   Yes Historical Provider, MD  mometasone-formoterol (DULERA) 100-5 MCG/ACT AERO Inhale 2 puffs into the lungs 2 (two) times daily. 06/28/15  Yes Fritzi Mandes, MD  nicotine (NICODERM CQ - DOSED IN MG/24 HOURS) 21 mg/24hr patch Place 1 patch (21 mg total) onto the skin daily. 12/13/15  Yes Nicholes Mango, MD  ondansetron (ZOFRAN-ODT) 4 MG disintegrating tablet Take 4 mg by mouth every 8 (eight) hours as needed for nausea or vomiting.   Yes Historical Provider, MD  oxyCODONE (OXYCONTIN) 20 mg 12 hr tablet Take 20 mg by mouth every 12 (twelve) hours.   Yes Historical Provider, MD  Oxycodone HCl 10 MG TABS Take 10  mg by mouth 4 (four) times daily. 8am, 12n, 4pm, 8pm   Yes Historical Provider, MD  polyethylene glycol (MIRALAX / GLYCOLAX) packet Take 17 g by mouth daily as needed (for constipation).    Yes Historical Provider, MD  potassium chloride SA (K-DUR,KLOR-CON) 20 MEQ tablet Take 20 mEq by mouth 2 (two) times daily.   Yes Historical Provider, MD  roflumilast (DALIRESP) 500 MCG TABS tablet Take 500 mcg by mouth daily.   Yes Historical Provider, MD  senna (SENNA-LAX) 8.6 MG tablet Take 2 tablets by mouth daily as needed for constipation.    Yes Historical Provider, MD  acetaminophen (TYLENOL) 325 MG tablet Take 650 mg by mouth every 4 (four) hours as needed (for pain/fever).     Historical Provider, MD  furosemide (LASIX) 40 MG tablet Take 40 mg by mouth daily.     Historical Provider, MD     Family History  Problem Relation Age of Onset  . Cervical cancer Mother   . Colon cancer Father     Social History   Social History  . Marital status: Single    Spouse name: N/A  . Number of children: N/A  . Years of education: N/A   Occupational History  . disabled    Social History Main Topics  . Smoking status: Current Some Day Smoker    Packs/day: 0.25    Years: 40.00    Types: Cigarettes  . Smokeless tobacco: Never Used  . Alcohol use No  . Drug use: No  . Sexual activity: Not Currently   Other Topics Concern  . None   Social History Narrative  . None    ECOG Status: 2 - Symptomatic, <50% confined to bed  Review of Systems: A 12 point ROS discussed and pertinent positives are indicated in the HPI above.  All other systems are negative.  Review of Systems  Constitutional: Negative for activity change, appetite change, fatigue, fever and unexpected weight change.  Respiratory: Positive for shortness of breath and wheezing. Negative for cough.        Baseline shortness of breath and wheezing.   Cardiovascular: Negative.     Vital Signs: BP 131/83   Pulse (!) 110   Temp 98.3 F (36.8 C)   Resp (!) 22   SpO2 91%   Physical Exam  Constitutional:  Patient appears older than his stated age.  HENT:  Head: Normocephalic and atraumatic.  Cardiovascular:  Distant heart sounds.  Pulmonary/Chest: He has wheezes.  Marked wheezes are noted within the left lung base.  Psychiatric:  Patient appears somewhat lethargic.  Nursing note and vitals reviewed.   Imaging: Nm Pet Image Initial (pi) Skull Base To Thigh  Result Date: 03/23/2016 CLINICAL DATA:  Initial treatment strategy for right lower lobe lung  lesion. EXAM: NUCLEAR MEDICINE PET SKULL BASE TO THIGH TECHNIQUE: 13.0 mCi F-18 FDG was injected intravenously. Full-ring PET imaging was performed from the skull base to thigh after the radiotracer. CT data was obtained and used for attenuation correction and anatomic localization. FASTING BLOOD GLUCOSE:  Value: 110 mg/dl COMPARISON:  Chest CT 01/23/2016 FINDINGS: NECK No hypermetabolic lymph nodes in the neck. CHEST Interval enlargement of the spiculated right lower lobe lung mass. It measures 38.5 x 28 mm on image 107 and previously measured 27 x 19 mm. This lesion is markedly hypermetabolic with SUV max of 35.0. There is a hypermetabolic 14 mm infrahilar lymph node on the right side with SUV max of 6.1. There is also a  hypermetabolic mediastinal node measuring 18.5 mm on image 103. SUV max is 11.7. No other pulmonary lesions are identified. No acute pulmonary findings. Stable emphysematous changes and pulmonary scarring. No pleural effusion. ABDOMEN/PELVIS No abnormal hypermetabolic activity within the liver, pancreas, adrenal glands, or spleen. No hypermetabolic lymph nodes in the abdomen or pelvis. SKELETON No focal hypermetabolic activity to suggest skeletal metastasis. IMPRESSION: Interval enlargement of the spiculated right lower lobe lung mass which is hypermetabolic and consistent with neoplasm. Right hilar and mediastinal metastatic adenopathy. No findings for distant metastatic disease. Electronically Signed   By: Marijo Sanes M.D.   On: 03/23/2016 15:08    Labs:  CBC:  Recent Labs  12/29/15 0135 01/23/16 1313 03/26/16 1141 04/01/16 1102  WBC 3.8 6.3 5.8 4.9  HGB 12.9* 13.7 12.7* 12.4*  HCT 39.0* 40.6 36.5* 36.7*  PLT 81* 191 178 233    COAGS:  Recent Labs  10/15/15 1353 10/18/15 1222 04/01/16 1102  INR 1.01 0.98 0.99  APTT 33  --   --     BMP:  Recent Labs  12/19/15 1752 12/29/15 0135 01/23/16 1313 03/26/16 1141  NA 139 137 137 136  K 3.2* 3.1* 3.9 4.2  CL 92*  93* 95* 97*  CO2 39* 38* 35* 32  GLUCOSE 197* 137* 142* 96  BUN 5* <5* 7 6  CALCIUM 8.8* 8.6* 9.6 9.3  CREATININE 0.64 0.51* 0.85 0.69  GFRNONAA >60 >60 >60 >60  GFRAA >60 >60 >60 >60    LIVER FUNCTION TESTS:  Recent Labs  10/24/15 0953 12/19/15 1752 12/29/15 0135 01/23/16 1313  BILITOT 0.8 0.8 4.8* 1.1  AST 126* 68* 64* 77*  ALT 250* 104* 73* 69*  ALKPHOS 57 58 61 75  PROT 7.1 6.4* 5.8* 7.0  ALBUMIN 3.8 3.3* 3.1* 3.8    TUMOR MARKERS: No results for input(s): AFPTM, CEA, CA199, CHROMGRNA in the last 8760 hours.  Assessment and Plan:  Craig Wright is a 51 y.o. male with past medical history significant for CHF, atrial fibrillation, COPD, CAD, diabetes and hypertension who was found to have a indeterminate mass within the right lower lobe on chest CT performed 01/22/2017. This mass was found to be hypermetabolic on subsequent PET/CT performed 03/23/2016. As such, patient presents today for CT-guided right lower lobe pulmonary mass biopsy.  Patient is currently without complaint. He is at his baseline shortness of breath for which he is on continuous 5 L of oxygen nasal cannula.   Results of preceding PET CT were discussed with the patient. I explained that there were 2 hypermetabolic mediastinal lymph nodes seen on the PET, though these were unlikely to yield a diagnosis with attempted bronchoscopic biopsy given size and location.  I also explained that there is no additional areas of hypermetabolic activity outside the centrally cavitary right lower lobe pulmonary mass.  As such, risks and benefits of CT-guided right lower lung discussed with the patient including, but not limited to bleeding, hemoptysis, respiratory failure requiring intubation, infection, pneumothorax requiring chest tube placement, stroke from air embolism or even death.  I explained that these risks are slightly more elevated in his case given his baseline supplemental oxygen requirement.  I also explained  that there is a chance this right lower lobe mass could represent infection or inflammation and an additional option could entail a three-month follow-up PET/CT.  All the patient's and the patient's mother's questions were answered.  Following this prolonged and detailed conversation with the patient and the patient's mother, the  patient wishes to proceed with CT-guided lung nodule biopsy.  Consent signed and in chart.  Thank you for this interesting consult.  I greatly enjoyed meeting Craig Wright and look forward to participating in their care.  A copy of this report was sent to the requesting provider on this date.  Electronically Signed: Sandi Mariscal 04/01/2016, 11:54 AM   I spent a total of 30 Minutes in face to face in clinical consultation, greater than 50% of which was counseling/coordinating care for CT-guided lung nodule biopsy.

## 2016-04-02 ENCOUNTER — Other Ambulatory Visit: Payer: Self-pay | Admitting: Anatomic Pathology & Clinical Pathology

## 2016-04-02 LAB — SURGICAL PATHOLOGY

## 2016-04-03 ENCOUNTER — Ambulatory Visit: Payer: Medicaid Other | Admitting: Hematology and Oncology

## 2016-04-08 ENCOUNTER — Inpatient Hospital Stay: Payer: Medicaid Other | Attending: Hematology and Oncology | Admitting: Hematology and Oncology

## 2016-04-08 ENCOUNTER — Encounter: Payer: Self-pay | Admitting: Hematology and Oncology

## 2016-04-08 ENCOUNTER — Other Ambulatory Visit: Payer: Self-pay | Admitting: Hematology and Oncology

## 2016-04-08 VITALS — BP 117/78 | HR 118 | Temp 96.2°F | Resp 18 | Wt 160.3 lb

## 2016-04-08 DIAGNOSIS — Z9981 Dependence on supplemental oxygen: Secondary | ICD-10-CM

## 2016-04-08 DIAGNOSIS — C3431 Malignant neoplasm of lower lobe, right bronchus or lung: Secondary | ICD-10-CM | POA: Diagnosis present

## 2016-04-08 DIAGNOSIS — J449 Chronic obstructive pulmonary disease, unspecified: Secondary | ICD-10-CM | POA: Diagnosis not present

## 2016-04-08 DIAGNOSIS — I4891 Unspecified atrial fibrillation: Secondary | ICD-10-CM | POA: Diagnosis not present

## 2016-04-08 DIAGNOSIS — R634 Abnormal weight loss: Secondary | ICD-10-CM | POA: Diagnosis not present

## 2016-04-08 DIAGNOSIS — R630 Anorexia: Secondary | ICD-10-CM

## 2016-04-08 DIAGNOSIS — F419 Anxiety disorder, unspecified: Secondary | ICD-10-CM | POA: Insufficient documentation

## 2016-04-08 DIAGNOSIS — M545 Low back pain: Secondary | ICD-10-CM | POA: Insufficient documentation

## 2016-04-08 DIAGNOSIS — R918 Other nonspecific abnormal finding of lung field: Secondary | ICD-10-CM | POA: Insufficient documentation

## 2016-04-08 DIAGNOSIS — Z79899 Other long term (current) drug therapy: Secondary | ICD-10-CM | POA: Insufficient documentation

## 2016-04-08 DIAGNOSIS — R5382 Chronic fatigue, unspecified: Secondary | ICD-10-CM | POA: Diagnosis not present

## 2016-04-08 DIAGNOSIS — Z800 Family history of malignant neoplasm of digestive organs: Secondary | ICD-10-CM | POA: Diagnosis not present

## 2016-04-08 DIAGNOSIS — Z7984 Long term (current) use of oral hypoglycemic drugs: Secondary | ICD-10-CM | POA: Diagnosis not present

## 2016-04-08 DIAGNOSIS — I2510 Atherosclerotic heart disease of native coronary artery without angina pectoris: Secondary | ICD-10-CM | POA: Insufficient documentation

## 2016-04-08 DIAGNOSIS — F17210 Nicotine dependence, cigarettes, uncomplicated: Secondary | ICD-10-CM | POA: Insufficient documentation

## 2016-04-08 DIAGNOSIS — M25569 Pain in unspecified knee: Secondary | ICD-10-CM | POA: Insufficient documentation

## 2016-04-08 DIAGNOSIS — I509 Heart failure, unspecified: Secondary | ICD-10-CM | POA: Diagnosis not present

## 2016-04-08 DIAGNOSIS — G8929 Other chronic pain: Secondary | ICD-10-CM | POA: Diagnosis not present

## 2016-04-08 DIAGNOSIS — E119 Type 2 diabetes mellitus without complications: Secondary | ICD-10-CM | POA: Diagnosis not present

## 2016-04-08 DIAGNOSIS — Z66 Do not resuscitate: Secondary | ICD-10-CM | POA: Insufficient documentation

## 2016-04-08 DIAGNOSIS — Z8049 Family history of malignant neoplasm of other genital organs: Secondary | ICD-10-CM

## 2016-04-08 DIAGNOSIS — I110 Hypertensive heart disease with heart failure: Secondary | ICD-10-CM | POA: Insufficient documentation

## 2016-04-08 MED ORDER — MEGESTROL ACETATE 400 MG/10ML PO SUSP: 200.0000 mg | Freq: Every day | ORAL | 0 refills | Status: DC

## 2016-04-08 NOTE — Progress Notes (Signed)
Patient concerned that he continues to loose weight.  Lost 5# in 2 weeks.  Also states he has no appetite.  Asking for medication to help.  States he does not sleep well either.  Patient is on 5 L O2.  Patient here today for CT and PET results.
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
## 2016-04-08 NOTE — Patient Instructions (Signed)
Paclitaxel injection What is this medicine? PACLITAXEL (PAK li TAX el) is a chemotherapy drug. It targets fast dividing cells, like cancer cells, and causes these cells to die. This medicine is used to treat ovarian cancer, breast cancer, and other cancers. This medicine may be used for other purposes; ask your health care provider or pharmacist if you have questions. COMMON BRAND NAME(S): Onxol, Taxol What should I tell my health care provider before I take this medicine? They need to know if you have any of these conditions: -blood disorders -irregular heartbeat -infection (especially a virus infection such as chickenpox, cold sores, or herpes) -liver disease -previous or ongoing radiation therapy -an unusual or allergic reaction to paclitaxel, alcohol, polyoxyethylated castor oil, other chemotherapy agents, other medicines, foods, dyes, or preservatives -pregnant or trying to get pregnant -breast-feeding How should I use this medicine? This drug is given as an infusion into a vein. It is administered in a hospital or clinic by a specially trained health care professional. Talk to your pediatrician regarding the use of this medicine in children. Special care may be needed. Overdosage: If you think you have taken too much of this medicine contact a poison control center or emergency room at once. NOTE: This medicine is only for you. Do not share this medicine with others. What if I miss a dose? It is important not to miss your dose. Call your doctor or health care professional if you are unable to keep an appointment. What may interact with this medicine? Do not take this medicine with any of the following medications: -disulfiram -metronidazole This medicine may also interact with the following medications: -cyclosporine -diazepam -ketoconazole -medicines to increase blood counts like filgrastim, pegfilgrastim, sargramostim -other chemotherapy drugs like cisplatin, doxorubicin,  epirubicin, etoposide, teniposide, vincristine -quinidine -testosterone -vaccines -verapamil Talk to your doctor or health care professional before taking any of these medicines: -acetaminophen -aspirin -ibuprofen -ketoprofen -naproxen This list may not describe all possible interactions. Give your health care provider a list of all the medicines, herbs, non-prescription drugs, or dietary supplements you use. Also tell them if you smoke, drink alcohol, or use illegal drugs. Some items may interact with your medicine. What should I watch for while using this medicine? Your condition will be monitored carefully while you are receiving this medicine. You will need important blood work done while you are taking this medicine. This medicine can cause serious allergic reactions. To reduce your risk you will need to take other medicine(s) before treatment with this medicine. If you experience allergic reactions like skin rash, itching or hives, swelling of the face, lips, or tongue, tell your doctor or health care professional right away. In some cases, you may be given additional medicines to help with side effects. Follow all directions for their use. This drug may make you feel generally unwell. This is not uncommon, as chemotherapy can affect healthy cells as well as cancer cells. Report any side effects. Continue your course of treatment even though you feel ill unless your doctor tells you to stop. Call your doctor or health care professional for advice if you get a fever, chills or sore throat, or other symptoms of a cold or flu. Do not treat yourself. This drug decreases your body's ability to fight infections. Try to avoid being around people who are sick. This medicine may increase your risk to bruise or bleed. Call your doctor or health care professional if you notice any unusual bleeding. Be careful brushing and flossing your teeth or   using a toothpick because you may get an infection or  bleed more easily. If you have any dental work done, tell your dentist you are receiving this medicine. Avoid taking products that contain aspirin, acetaminophen, ibuprofen, naproxen, or ketoprofen unless instructed by your doctor. These medicines may hide a fever. Do not become pregnant while taking this medicine. Women should inform their doctor if they wish to become pregnant or think they might be pregnant. There is a potential for serious side effects to an unborn child. Talk to your health care professional or pharmacist for more information. Do not breast-feed an infant while taking this medicine. Men are advised not to father a child while receiving this medicine. This product may contain alcohol. Ask your pharmacist or healthcare provider if this medicine contains alcohol. Be sure to tell all healthcare providers you are taking this medicine. Certain medicines, like metronidazole and disulfiram, can cause an unpleasant reaction when taken with alcohol. The reaction includes flushing, headache, nausea, vomiting, sweating, and increased thirst. The reaction can last from 30 minutes to several hours. What side effects may I notice from receiving this medicine? Side effects that you should report to your doctor or health care professional as soon as possible: -allergic reactions like skin rash, itching or hives, swelling of the face, lips, or tongue -low blood counts - This drug may decrease the number of white blood cells, red blood cells and platelets. You may be at increased risk for infections and bleeding. -signs of infection - fever or chills, cough, sore throat, pain or difficulty passing urine -signs of decreased platelets or bleeding - bruising, pinpoint red spots on the skin, black, tarry stools, nosebleeds -signs of decreased red blood cells - unusually weak or tired, fainting spells, lightheadedness -breathing problems -chest pain -high or low blood pressure -mouth sores -nausea and  vomiting -pain, swelling, redness or irritation at the injection site -pain, tingling, numbness in the hands or feet -slow or irregular heartbeat -swelling of the ankle, feet, hands Side effects that usually do not require medical attention (report to your doctor or health care professional if they continue or are bothersome): -bone pain -complete hair loss including hair on your head, underarms, pubic hair, eyebrows, and eyelashes -changes in the color of fingernails -diarrhea -loosening of the fingernails -loss of appetite -muscle or joint pain -red flush to skin -sweating This list may not describe all possible side effects. Call your doctor for medical advice about side effects. You may report side effects to FDA at 1-800-FDA-1088. Where should I keep my medicine? This drug is given in a hospital or clinic and will not be stored at home. NOTE: This sheet is a summary. It may not cover all possible information. If you have questions about this medicine, talk to your doctor, pharmacist, or health care provider.  2017 [employer] Standard (2014-12-11 19:58:00) Carboplatin injection What is this medicine? CARBOPLATIN (KAR boe pla tin) is a chemotherapy drug. It targets fast dividing cells, like cancer cells, and causes these cells to die. This medicine is used to treat ovarian cancer and many other cancers. This medicine may be used for other purposes; ask your health care provider or pharmacist if you have questions. COMMON BRAND NAME(S): Paraplatin What should I tell my health care provider before I take this medicine? They need to know if you have any of these conditions: -blood disorders -hearing problems -kidney disease -recent or ongoing radiation therapy -an unusual or allergic reaction to carboplatin, cisplatin, other chemotherapy, other  medicines, foods, dyes, or preservatives -pregnant or trying to get pregnant -breast-feeding How should I use this medicine? This drug  is usually given as an infusion into a vein. It is administered in a hospital or clinic by a specially trained health care professional. Talk to your pediatrician regarding the use of this medicine in children. Special care may be needed. Overdosage: If you think you have taken too much of this medicine contact a poison control center or emergency room at once. NOTE: This medicine is only for you. Do not share this medicine with others. What if I miss a dose? It is important not to miss a dose. Call your doctor or health care professional if you are unable to keep an appointment. What may interact with this medicine? -medicines for seizures -medicines to increase blood counts like filgrastim, pegfilgrastim, sargramostim -some antibiotics like amikacin, gentamicin, neomycin, streptomycin, tobramycin -vaccines Talk to your doctor or health care professional before taking any of these medicines: -acetaminophen -aspirin -ibuprofen -ketoprofen -naproxen This list may not describe all possible interactions. Give your health care provider a list of all the medicines, herbs, non-prescription drugs, or dietary supplements you use. Also tell them if you smoke, drink alcohol, or use illegal drugs. Some items may interact with your medicine. What should I watch for while using this medicine? Your condition will be monitored carefully while you are receiving this medicine. You will need important blood work done while you are taking this medicine. This drug may make you feel generally unwell. This is not uncommon, as chemotherapy can affect healthy cells as well as cancer cells. Report any side effects. Continue your course of treatment even though you feel ill unless your doctor tells you to stop. In some cases, you may be given additional medicines to help with side effects. Follow all directions for their use. Call your doctor or health care professional for advice if you get a fever, chills or sore  throat, or other symptoms of a cold or flu. Do not treat yourself. This drug decreases your body's ability to fight infections. Try to avoid being around people who are sick. This medicine may increase your risk to bruise or bleed. Call your doctor or health care professional if you notice any unusual bleeding. Be careful brushing and flossing your teeth or using a toothpick because you may get an infection or bleed more easily. If you have any dental work done, tell your dentist you are receiving this medicine. Avoid taking products that contain aspirin, acetaminophen, ibuprofen, naproxen, or ketoprofen unless instructed by your doctor. These medicines may hide a fever. Do not become pregnant while taking this medicine. Women should inform their doctor if they wish to become pregnant or think they might be pregnant. There is a potential for serious side effects to an unborn child. Talk to your health care professional or pharmacist for more information. Do not breast-feed an infant while taking this medicine. What side effects may I notice from receiving this medicine? Side effects that you should report to your doctor or health care professional as soon as possible: -allergic reactions like skin rash, itching or hives, swelling of the face, lips, or tongue -signs of infection - fever or chills, cough, sore throat, pain or difficulty passing urine -signs of decreased platelets or bleeding - bruising, pinpoint red spots on the skin, black, tarry stools, nosebleeds -signs of decreased red blood cells - unusually weak or tired, fainting spells, lightheadedness -breathing problems -changes in hearing -changes in   vision -chest pain -high blood pressure -low blood counts - This drug may decrease the number of white blood cells, red blood cells and platelets. You may be at increased risk for infections and bleeding. -nausea and vomiting -pain, swelling, redness or irritation at the injection site -pain,  tingling, numbness in the hands or feet -problems with balance, talking, walking -trouble passing urine or change in the amount of urine Side effects that usually do not require medical attention (report to your doctor or health care professional if they continue or are bothersome): -hair loss -loss of appetite -metallic taste in the mouth or changes in taste This list may not describe all possible side effects. Call your doctor for medical advice about side effects. You may report side effects to FDA at 1-800-FDA-1088. Where should I keep my medicine? This drug is given in a hospital or clinic and will not be stored at home. NOTE: This sheet is a summary. It may not cover all possible information. If you have questions about this medicine, talk to your doctor, pharmacist, or health care provider.  2017 [employer] Standard (2007-05-17 14:38:05) Megestrol oral suspension What is this medicine? MEGESTROL (me JES trol) suspension improves appetite and helps cause weight gain. It is used in conditions that cause significant loss of appetite and weight, such as AIDS. This medicine may be used for other purposes; ask your health care provider or pharmacist if you have questions. COMMON BRAND NAME(S): Megace, Megace ES What should I tell my health care provider before I take this medicine? They need to know if you have any of these conditions: -adrenal gland problems -history of blood clots of the legs, lungs, or other parts of the body -diabetes -kidney disease -liver disease -an unusual or allergic reaction to megestrol, other medicines, foods, dyes, or preservatives -pregnant or trying to get pregnant -breast-feeding How should I use this medicine? Take this medicine by mouth. Follow the directions on the prescription label. Shake well before using. Use a specially marked spoon or container to measure your medicine. Ask your pharmacist if you do not have one. Household spoons are not  accurate. Take your medicine at regular intervals. Do not take your medicine more often than directed. Do not stop taking except on your doctor's advice. Contact your pediatrician or health care provider regarding the use of this medicine in children. Special care may be needed. Overdosage: If you think you have taken too much of this medicine contact a poison control center or emergency room at once. NOTE: This medicine is only for you. Do not share this medicine with others. What if I miss a dose? If you miss or forget a single dose, continue with your next regularly scheduled dose on the next day. Do not take double or extra doses. What may interact with this medicine? Do not take this medicine with any of the following medications: -dofetilide This medicine may also interact with the following medications: -carbamazepine -indinavir -phenobarbital -phenytoin -primidone -rifampin -warfarin This list may not describe all possible interactions. Give your health care provider a list of all the medicines, herbs, non-prescription drugs, or dietary supplements you use. Also tell them if you smoke, drink alcohol, or use illegal drugs. Some items may interact with your medicine. What should I watch for while using this medicine? Visit your doctor or health care professional for regular checks on your progress. It may take up to 3 weeks to first notice an improved appetite. It may take up to 12 weeks  to notice weight gain. If you are a male of child-bearing age, use an effective method of birth control while you are taking this medicine. This medicine should not be used by females who are pregnant or breast-feeding. There is a potential for serious side effects to an unborn child or to an infant. Talk to your health care professional or pharmacist for more information. This medicine may affect blood sugar levels. If you have diabetes, check with your doctor or health care professional before you  change your diet or the dose of your diabetic medicine. What side effects may I notice from receiving this medicine? Side effects that you should report to your doctor or health care professional as soon as possible: -allergic reactions like skin rash, itching or hives, swelling of the face, lips, or tongue -difficulty breathing or shortness of breath -chest pain -dizziness -fluid retention -increased blood pressure -leg pain or swelling -nausea, vomiting -weakness Side effects that usually do not require medical attention (report to your doctor or health care professional if they continue or are bothersome): -breakthrough menstrual bleeding -diarrhea -gas -hot flashes or flushing -sexual difficulties in men This list may not describe all possible side effects. Call your doctor for medical advice about side effects. You may report side effects to FDA at 1-800-FDA-1088. Where should I keep my medicine? Keep out of the reach of children. Store at room temperature between 15 and 25 degrees C (59 and 77 degrees F). Keep tightly closed. Protect from heat. Throw away any unused medicine after the expiration date. NOTE: This sheet is a summary. It may not cover all possible information. If you have questions about this medicine, talk to your doctor, pharmacist, or health care provider.  2017 [employer] Standard (2007-08-29 15:58:31)

## 2016-04-08 NOTE — Progress Notes (Signed)
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: Carboplatin AUC=2 + Paclitaxel 45 mg/m2 Weekly During Radiation   Administer weekly:     Paclitaxel (Taxol(R)) 45 mg/m2 in 250 mL NS IV over 1 hour followed by Dose Mod: None     Carboplatin (Paraplatin(R)) AUC=2 in 100 mL NS IV over 30 minutes Dose Mod: None Additional Orders: * All AUC calculations intended to be used in [employer] formula  **Always confirm dose/schedule in your pharmacy ordering system**    Patient Characteristics: Stage III - Unresectable, PS = 0, 1 AJCC T Category: T2a Current Disease Status: No Distant Mets or Local Recurrence AJCC N Category: N2 AJCC M Category: M0 AJCC 8 Stage Grouping: IIIA Performance Status: PS = 0, 1  Intent of Therapy: Curative Intent, Discussed with Patient

## 2016-04-09 NOTE — Patient Instructions (Signed)
Paclitaxel injection What is this medicine? PACLITAXEL (PAK li TAX el) is a chemotherapy drug. It targets fast dividing cells, like cancer cells, and causes these cells to die. This medicine is used to treat ovarian cancer, breast cancer, and other cancers. This medicine may be used for other purposes; ask your health care provider or pharmacist if you have questions. COMMON BRAND NAME(S): Onxol, Taxol What should I tell my health care provider before I take this medicine? They need to know if you have any of these conditions: -blood disorders -irregular heartbeat -infection (especially a virus infection such as chickenpox, cold sores, or herpes) -liver disease -previous or ongoing radiation therapy -an unusual or allergic reaction to paclitaxel, alcohol, polyoxyethylated castor oil, other chemotherapy agents, other medicines, foods, dyes, or preservatives -pregnant or trying to get pregnant -breast-feeding How should I use this medicine? This drug is given as an infusion into a vein. It is administered in a hospital or clinic by a specially trained health care professional. Talk to your pediatrician regarding the use of this medicine in children. Special care may be needed. Overdosage: If you think you have taken too much of this medicine contact a poison control center or emergency room at once. NOTE: This medicine is only for you. Do not share this medicine with others. What if I miss a dose? It is important not to miss your dose. Call your doctor or health care professional if you are unable to keep an appointment. What may interact with this medicine? Do not take this medicine with any of the following medications: -disulfiram -metronidazole This medicine may also interact with the following medications: -cyclosporine -diazepam -ketoconazole -medicines to increase blood counts like filgrastim, pegfilgrastim, sargramostim -other chemotherapy drugs like cisplatin, doxorubicin,  epirubicin, etoposide, teniposide, vincristine -quinidine -testosterone -vaccines -verapamil Talk to your doctor or health care professional before taking any of these medicines: -acetaminophen -aspirin -ibuprofen -ketoprofen -naproxen This list may not describe all possible interactions. Give your health care provider a list of all the medicines, herbs, non-prescription drugs, or dietary supplements you use. Also tell them if you smoke, drink alcohol, or use illegal drugs. Some items may interact with your medicine. What should I watch for while using this medicine? Your condition will be monitored carefully while you are receiving this medicine. You will need important blood work done while you are taking this medicine. This medicine can cause serious allergic reactions. To reduce your risk you will need to take other medicine(s) before treatment with this medicine. If you experience allergic reactions like skin rash, itching or hives, swelling of the face, lips, or tongue, tell your doctor or health care professional right away. In some cases, you may be given additional medicines to help with side effects. Follow all directions for their use. This drug may make you feel generally unwell. This is not uncommon, as chemotherapy can affect healthy cells as well as cancer cells. Report any side effects. Continue your course of treatment even though you feel ill unless your doctor tells you to stop. Call your doctor or health care professional for advice if you get a fever, chills or sore throat, or other symptoms of a cold or flu. Do not treat yourself. This drug decreases your body's ability to fight infections. Try to avoid being around people who are sick. This medicine may increase your risk to bruise or bleed. Call your doctor or health care professional if you notice any unusual bleeding. Be careful brushing and flossing your teeth or   using a toothpick because you may get an infection or  bleed more easily. If you have any dental work done, tell your dentist you are receiving this medicine. Avoid taking products that contain aspirin, acetaminophen, ibuprofen, naproxen, or ketoprofen unless instructed by your doctor. These medicines may hide a fever. Do not become pregnant while taking this medicine. Women should inform their doctor if they wish to become pregnant or think they might be pregnant. There is a potential for serious side effects to an unborn child. Talk to your health care professional or pharmacist for more information. Do not breast-feed an infant while taking this medicine. Men are advised not to father a child while receiving this medicine. This product may contain alcohol. Ask your pharmacist or healthcare provider if this medicine contains alcohol. Be sure to tell all healthcare providers you are taking this medicine. Certain medicines, like metronidazole and disulfiram, can cause an unpleasant reaction when taken with alcohol. The reaction includes flushing, headache, nausea, vomiting, sweating, and increased thirst. The reaction can last from 30 minutes to several hours. What side effects may I notice from receiving this medicine? Side effects that you should report to your doctor or health care professional as soon as possible: -allergic reactions like skin rash, itching or hives, swelling of the face, lips, or tongue -low blood counts - This drug may decrease the number of white blood cells, red blood cells and platelets. You may be at increased risk for infections and bleeding. -signs of infection - fever or chills, cough, sore throat, pain or difficulty passing urine -signs of decreased platelets or bleeding - bruising, pinpoint red spots on the skin, black, tarry stools, nosebleeds -signs of decreased red blood cells - unusually weak or tired, fainting spells, lightheadedness -breathing problems -chest pain -high or low blood pressure -mouth sores -nausea and  vomiting -pain, swelling, redness or irritation at the injection site -pain, tingling, numbness in the hands or feet -slow or irregular heartbeat -swelling of the ankle, feet, hands Side effects that usually do not require medical attention (report to your doctor or health care professional if they continue or are bothersome): -bone pain -complete hair loss including hair on your head, underarms, pubic hair, eyebrows, and eyelashes -changes in the color of fingernails -diarrhea -loosening of the fingernails -loss of appetite -muscle or joint pain -red flush to skin -sweating This list may not describe all possible side effects. Call your doctor for medical advice about side effects. You may report side effects to FDA at 1-800-FDA-1088. Where should I keep my medicine? This drug is given in a hospital or clinic and will not be stored at home. NOTE: This sheet is a summary. It may not cover all possible information. If you have questions about this medicine, talk to your doctor, pharmacist, or health care provider.  2017 Elsevier/Gold Standard (2014-12-11 19:58:00)

## 2016-04-13 ENCOUNTER — Ambulatory Visit
Admission: RE | Admit: 2016-04-13 | Discharge: 2016-04-13 | Disposition: A | Discharge status: Home / Self Care | Payer: Medicaid Other | Source: Ambulatory Visit | Attending: Hematology and Oncology | Admitting: Hematology and Oncology

## 2016-04-13 DIAGNOSIS — C3431 Malignant neoplasm of lower lobe, right bronchus or lung: Secondary | ICD-10-CM | POA: Diagnosis not present

## 2016-04-13 DIAGNOSIS — S0240ES Zygomatic fracture, right side, sequela: Secondary | ICD-10-CM | POA: Insufficient documentation

## 2016-04-13 DIAGNOSIS — M25569 Pain in unspecified knee: Secondary | ICD-10-CM

## 2016-04-13 DIAGNOSIS — M2550 Pain in unspecified joint: Secondary | ICD-10-CM | POA: Insufficient documentation

## 2016-04-13 DIAGNOSIS — G8929 Other chronic pain: Secondary | ICD-10-CM | POA: Insufficient documentation

## 2016-04-13 DIAGNOSIS — H7013 Chronic mastoiditis, bilateral: Secondary | ICD-10-CM | POA: Insufficient documentation

## 2016-04-13 DIAGNOSIS — R600 Localized edema: Secondary | ICD-10-CM | POA: Diagnosis not present

## 2016-04-13 DIAGNOSIS — X58XXXS Exposure to other specified factors, sequela: Secondary | ICD-10-CM | POA: Diagnosis not present

## 2016-04-13 DIAGNOSIS — M25559 Pain in unspecified hip: Secondary | ICD-10-CM | POA: Insufficient documentation

## 2016-04-13 DIAGNOSIS — R634 Abnormal weight loss: Secondary | ICD-10-CM | POA: Insufficient documentation

## 2016-04-13 DIAGNOSIS — M25512 Pain in left shoulder: Secondary | ICD-10-CM

## 2016-04-13 MED ORDER — IOPAMIDOL (ISOVUE-300) INJECTION 61%: 75.0000 mL | Freq: Once | INTRAVENOUS | Status: DC | PRN

## 2016-04-13 MED ORDER — IOPAMIDOL (ISOVUE-300) INJECTION 61%: 100.0000 mL | Freq: Once | INTRAVENOUS | Status: DC | PRN

## 2016-04-14 ENCOUNTER — Inpatient Hospital Stay: Payer: Medicaid Other

## 2016-04-14 ENCOUNTER — Encounter: Payer: Self-pay | Admitting: Radiation Oncology

## 2016-04-14 ENCOUNTER — Ambulatory Visit
Admission: RE | Admit: 2016-04-14 | Discharge: 2016-04-14 | Disposition: A | Discharge status: Home / Self Care | Payer: Medicaid Other | Source: Ambulatory Visit | Attending: Radiation Oncology | Admitting: Radiation Oncology

## 2016-04-14 VITALS — BP 139/83 | HR 113 | Temp 96.1°F | Resp 22 | Wt 165.2 lb

## 2016-04-14 DIAGNOSIS — J449 Chronic obstructive pulmonary disease, unspecified: Secondary | ICD-10-CM | POA: Insufficient documentation

## 2016-04-14 DIAGNOSIS — Z7984 Long term (current) use of oral hypoglycemic drugs: Secondary | ICD-10-CM | POA: Insufficient documentation

## 2016-04-14 DIAGNOSIS — I509 Heart failure, unspecified: Secondary | ICD-10-CM | POA: Diagnosis not present

## 2016-04-14 DIAGNOSIS — C3431 Malignant neoplasm of lower lobe, right bronchus or lung: Secondary | ICD-10-CM | POA: Diagnosis not present

## 2016-04-14 DIAGNOSIS — Z808 Family history of malignant neoplasm of other organs or systems: Secondary | ICD-10-CM | POA: Insufficient documentation

## 2016-04-14 DIAGNOSIS — F419 Anxiety disorder, unspecified: Secondary | ICD-10-CM | POA: Diagnosis not present

## 2016-04-14 DIAGNOSIS — E119 Type 2 diabetes mellitus without complications: Secondary | ICD-10-CM | POA: Diagnosis not present

## 2016-04-14 DIAGNOSIS — R531 Weakness: Secondary | ICD-10-CM | POA: Insufficient documentation

## 2016-04-14 DIAGNOSIS — F17210 Nicotine dependence, cigarettes, uncomplicated: Secondary | ICD-10-CM | POA: Diagnosis not present

## 2016-04-14 DIAGNOSIS — I10 Essential (primary) hypertension: Secondary | ICD-10-CM | POA: Diagnosis not present

## 2016-04-14 DIAGNOSIS — I4891 Unspecified atrial fibrillation: Secondary | ICD-10-CM | POA: Diagnosis not present

## 2016-04-14 DIAGNOSIS — Z79899 Other long term (current) drug therapy: Secondary | ICD-10-CM | POA: Insufficient documentation

## 2016-04-14 DIAGNOSIS — Z800 Family history of malignant neoplasm of digestive organs: Secondary | ICD-10-CM | POA: Diagnosis not present

## 2016-04-14 DIAGNOSIS — Z510 Encounter for antineoplastic radiation therapy: Secondary | ICD-10-CM | POA: Diagnosis not present

## 2016-04-14 DIAGNOSIS — I2510 Atherosclerotic heart disease of native coronary artery without angina pectoris: Secondary | ICD-10-CM | POA: Diagnosis not present

## 2016-04-14 DIAGNOSIS — R599 Enlarged lymph nodes, unspecified: Secondary | ICD-10-CM | POA: Insufficient documentation

## 2016-04-14 DIAGNOSIS — R630 Anorexia: Secondary | ICD-10-CM | POA: Insufficient documentation
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
## 2016-04-15 ENCOUNTER — Other Ambulatory Visit: Payer: Self-pay | Admitting: *Deleted

## 2016-04-15 ENCOUNTER — Inpatient Hospital Stay (HOSPITAL_BASED_OUTPATIENT_CLINIC_OR_DEPARTMENT_OTHER): Payer: Medicaid Other | Admitting: Hematology and Oncology

## 2016-04-15 ENCOUNTER — Encounter: Payer: Self-pay | Admitting: Hematology and Oncology

## 2016-04-15 ENCOUNTER — Inpatient Hospital Stay: Payer: Medicaid Other

## 2016-04-15 ENCOUNTER — Ambulatory Visit: Payer: Medicaid Other

## 2016-04-15 ENCOUNTER — Telehealth: Payer: Self-pay | Admitting: *Deleted

## 2016-04-15 ENCOUNTER — Other Ambulatory Visit: Payer: Self-pay | Admitting: Oncology

## 2016-04-15 VITALS — BP 126/84 | HR 114 | Temp 97.8°F | Wt 160.1 lb

## 2016-04-15 DIAGNOSIS — F419 Anxiety disorder, unspecified: Secondary | ICD-10-CM

## 2016-04-15 DIAGNOSIS — Z7984 Long term (current) use of oral hypoglycemic drugs: Secondary | ICD-10-CM

## 2016-04-15 DIAGNOSIS — I2510 Atherosclerotic heart disease of native coronary artery without angina pectoris: Secondary | ICD-10-CM

## 2016-04-15 DIAGNOSIS — Z9981 Dependence on supplemental oxygen: Secondary | ICD-10-CM

## 2016-04-15 DIAGNOSIS — Z800 Family history of malignant neoplasm of digestive organs: Secondary | ICD-10-CM

## 2016-04-15 DIAGNOSIS — I509 Heart failure, unspecified: Secondary | ICD-10-CM

## 2016-04-15 DIAGNOSIS — R634 Abnormal weight loss: Secondary | ICD-10-CM

## 2016-04-15 DIAGNOSIS — C3431 Malignant neoplasm of lower lobe, right bronchus or lung: Secondary | ICD-10-CM

## 2016-04-15 DIAGNOSIS — J449 Chronic obstructive pulmonary disease, unspecified: Secondary | ICD-10-CM

## 2016-04-15 DIAGNOSIS — F17210 Nicotine dependence, cigarettes, uncomplicated: Secondary | ICD-10-CM

## 2016-04-15 DIAGNOSIS — E119 Type 2 diabetes mellitus without complications: Secondary | ICD-10-CM

## 2016-04-15 DIAGNOSIS — I110 Hypertensive heart disease with heart failure: Secondary | ICD-10-CM

## 2016-04-15 DIAGNOSIS — Z66 Do not resuscitate: Secondary | ICD-10-CM

## 2016-04-15 DIAGNOSIS — R5382 Chronic fatigue, unspecified: Secondary | ICD-10-CM | POA: Diagnosis not present

## 2016-04-15 DIAGNOSIS — G8929 Other chronic pain: Secondary | ICD-10-CM

## 2016-04-15 DIAGNOSIS — M545 Low back pain: Secondary | ICD-10-CM

## 2016-04-15 DIAGNOSIS — Z8049 Family history of malignant neoplasm of other genital organs: Secondary | ICD-10-CM

## 2016-04-15 DIAGNOSIS — R630 Anorexia: Secondary | ICD-10-CM

## 2016-04-15 DIAGNOSIS — Z79899 Other long term (current) drug therapy: Secondary | ICD-10-CM

## 2016-04-15 DIAGNOSIS — M25569 Pain in unspecified knee: Secondary | ICD-10-CM

## 2016-04-15 DIAGNOSIS — I4891 Unspecified atrial fibrillation: Secondary | ICD-10-CM

## 2016-04-15 LAB — CBC WITH DIFFERENTIAL/PLATELET
Basophils Absolute: 0 10*3/uL (ref 0–0.1)
Basophils Relative: 1 %
Eosinophils Absolute: 0.1 10*3/uL (ref 0–0.7)
Eosinophils Relative: 2 %
HCT: 38.1 % — ABNORMAL LOW (ref 40.0–52.0)
Hemoglobin: 12.6 g/dL — ABNORMAL LOW (ref 13.0–18.0)
Lymphocytes Relative: 30 %
Lymphs Abs: 1.6 10*3/uL (ref 1.0–3.6)
MCH: 28.8 pg (ref 26.0–34.0)
MCHC: 33.2 g/dL (ref 32.0–36.0)
MCV: 86.9 fL (ref 80.0–100.0)
Monocytes Absolute: 0.5 10*3/uL (ref 0.2–1.0)
Monocytes Relative: 9 %
Neutro Abs: 3.1 10*3/uL (ref 1.4–6.5)
Neutrophils Relative %: 58 %
Platelets: 186 10*3/uL (ref 150–440)
RBC: 4.38 MIL/uL — ABNORMAL LOW (ref 4.40–5.90)
RDW: 13.5 % (ref 11.5–14.5)
WBC: 5.3 10*3/uL (ref 3.8–10.6)

## 2016-04-15 LAB — COMPREHENSIVE METABOLIC PANEL
ALT: 11 U/L — ABNORMAL LOW (ref 17–63)
AST: 21 U/L (ref 15–41)
Albumin: 3.5 g/dL (ref 3.5–5.0)
Alkaline Phosphatase: 45 U/L (ref 38–126)
Anion gap: 7 (ref 5–15)
BUN: 6 mg/dL (ref 6–20)
CO2: 29 mmol/L (ref 22–32)
Calcium: 9.1 mg/dL (ref 8.9–10.3)
Chloride: 98 mmol/L — ABNORMAL LOW (ref 101–111)
Creatinine, Ser: 0.66 mg/dL (ref 0.61–1.24)
GFR calc Af Amer: >60 mL/min (ref >60)
GFR calc non Af Amer: >60 mL/min (ref >60)
Glucose, Bld: 164 mg/dL — ABNORMAL HIGH (ref 65–99)
Potassium: 4 mmol/L (ref 3.5–5.1)
Sodium: 134 mmol/L — ABNORMAL LOW (ref 135–145)
Total Bilirubin: 0.4 mg/dL (ref 0.3–1.2)
Total Protein: 7.7 g/dL (ref 6.5–8.1)

## 2016-04-15 LAB — MAGNESIUM: Magnesium: 1.6 mg/dL — ABNORMAL LOW (ref 1.7–2.4)

## 2016-04-15 MED ORDER — MAGNESIUM OXIDE 400 MG PO TABS: 400.0000 mg | ORAL_TABLET | Freq: Every day | ORAL | 2 refills | Status: AC

## 2016-04-15 MED ORDER — MAGNESIUM OXIDE 400 MG PO TABS: 400.0000 mg | ORAL_TABLET | Freq: Every day | ORAL | 2 refills | Status: DC

## 2016-04-15 NOTE — Telephone Encounter (Signed)
Called RN of [facility] and discussed magnesium once a day and to call with diarrhea, voiced understanding.  rx sent to home and pharmacy.

## 2016-04-15 NOTE — Progress Notes (Signed)
Patient offers no complaints today. 

## 2016-04-16 ENCOUNTER — Ambulatory Visit
Admission: RE | Admit: 2016-04-16 | Discharge: 2016-04-16 | Disposition: A | Discharge status: Home / Self Care | Payer: Medicaid Other | Source: Ambulatory Visit | Attending: Radiation Oncology | Admitting: Radiation Oncology

## 2016-04-16 DIAGNOSIS — Z510 Encounter for antineoplastic radiation therapy: Secondary | ICD-10-CM | POA: Diagnosis not present

## 2016-04-16 NOTE — Progress Notes (Unsigned)
PSN ordered 6 cases of Boost nutritional supplement for patient from [facility] will deliver the cases of boost to the facility in the next couple of days.

## 2016-04-21 DIAGNOSIS — Z510 Encounter for antineoplastic radiation therapy: Secondary | ICD-10-CM | POA: Diagnosis not present

## 2016-04-27 ENCOUNTER — Ambulatory Visit
Admission: RE | Admit: 2016-04-27 | Discharge: 2016-04-27 | Disposition: A | Discharge status: Home / Self Care | Payer: Medicaid Other | Source: Ambulatory Visit | Attending: Radiation Oncology | Admitting: Radiation Oncology

## 2016-04-27 ENCOUNTER — Other Ambulatory Visit: Payer: Self-pay | Admitting: Oncology

## 2016-04-27 DIAGNOSIS — C3431 Malignant neoplasm of lower lobe, right bronchus or lung: Secondary | ICD-10-CM

## 2016-04-27 DIAGNOSIS — Z510 Encounter for antineoplastic radiation therapy: Secondary | ICD-10-CM | POA: Diagnosis not present

## 2016-04-27 MED ORDER — DEXAMETHASONE 4 MG PO TABS: 8.0000 mg | ORAL_TABLET | Freq: Every day | ORAL | 1 refills | Status: DC

## 2016-04-27 MED ORDER — LORAZEPAM 0.5 MG PO TABS: 0.5000 mg | ORAL_TABLET | Freq: Four times a day (QID) | ORAL | 0 refills | Status: DC | PRN

## 2016-04-27 MED ORDER — LIDOCAINE-PRILOCAINE 2.5-2.5 % EX CREA: TOPICAL_CREAM | CUTANEOUS | 3 refills | Status: DC

## 2016-04-27 MED ORDER — ONDANSETRON HCL 8 MG PO TABS: 8.0000 mg | ORAL_TABLET | Freq: Two times a day (BID) | ORAL | 1 refills | Status: DC | PRN

## 2016-04-27 MED ORDER — PROCHLORPERAZINE MALEATE 10 MG PO TABS: 10.0000 mg | ORAL_TABLET | Freq: Four times a day (QID) | ORAL | 1 refills | Status: DC | PRN
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
## 2016-04-28 ENCOUNTER — Other Ambulatory Visit: Payer: Self-pay | Admitting: Hematology and Oncology

## 2016-04-28 ENCOUNTER — Other Ambulatory Visit: Payer: Self-pay

## 2016-04-28 ENCOUNTER — Ambulatory Visit: Payer: Medicaid Other

## 2016-04-28 ENCOUNTER — Ambulatory Visit
Admission: RE | Admit: 2016-04-28 | Discharge: 2016-04-28 | Disposition: A | Discharge status: Home / Self Care | Payer: Medicaid Other | Source: Ambulatory Visit | Attending: Radiation Oncology | Admitting: Radiation Oncology

## 2016-04-28 ENCOUNTER — Inpatient Hospital Stay: Payer: Medicaid Other

## 2016-04-28 ENCOUNTER — Encounter: Payer: Self-pay | Admitting: Oncology

## 2016-04-28 ENCOUNTER — Inpatient Hospital Stay: Payer: Medicaid Other | Attending: Hematology and Oncology | Admitting: Oncology

## 2016-04-28 VITALS — BP 137/92 | HR 117 | Temp 97.2°F | Wt 164.9 lb

## 2016-04-28 VITALS — BP 134/84 | HR 99 | Resp 22

## 2016-04-28 DIAGNOSIS — R51 Headache: Secondary | ICD-10-CM | POA: Insufficient documentation

## 2016-04-28 DIAGNOSIS — R630 Anorexia: Secondary | ICD-10-CM | POA: Diagnosis not present

## 2016-04-28 DIAGNOSIS — G8929 Other chronic pain: Secondary | ICD-10-CM | POA: Insufficient documentation

## 2016-04-28 DIAGNOSIS — Z79899 Other long term (current) drug therapy: Secondary | ICD-10-CM | POA: Insufficient documentation

## 2016-04-28 DIAGNOSIS — D72819 Decreased white blood cell count, unspecified: Secondary | ICD-10-CM | POA: Insufficient documentation

## 2016-04-28 DIAGNOSIS — R634 Abnormal weight loss: Secondary | ICD-10-CM | POA: Insufficient documentation

## 2016-04-28 DIAGNOSIS — R110 Nausea: Secondary | ICD-10-CM | POA: Diagnosis not present

## 2016-04-28 DIAGNOSIS — R454 Irritability and anger: Secondary | ICD-10-CM | POA: Diagnosis not present

## 2016-04-28 DIAGNOSIS — Z7984 Long term (current) use of oral hypoglycemic drugs: Secondary | ICD-10-CM

## 2016-04-28 DIAGNOSIS — M545 Low back pain: Secondary | ICD-10-CM | POA: Insufficient documentation

## 2016-04-28 DIAGNOSIS — R7989 Other specified abnormal findings of blood chemistry: Secondary | ICD-10-CM | POA: Insufficient documentation

## 2016-04-28 DIAGNOSIS — R5383 Other fatigue: Secondary | ICD-10-CM | POA: Insufficient documentation

## 2016-04-28 DIAGNOSIS — C3431 Malignant neoplasm of lower lobe, right bronchus or lung: Secondary | ICD-10-CM | POA: Insufficient documentation

## 2016-04-28 DIAGNOSIS — F419 Anxiety disorder, unspecified: Secondary | ICD-10-CM | POA: Insufficient documentation

## 2016-04-28 DIAGNOSIS — I2510 Atherosclerotic heart disease of native coronary artery without angina pectoris: Secondary | ICD-10-CM | POA: Diagnosis not present

## 2016-04-28 DIAGNOSIS — Z5111 Encounter for antineoplastic chemotherapy: Secondary | ICD-10-CM | POA: Insufficient documentation

## 2016-04-28 DIAGNOSIS — R531 Weakness: Secondary | ICD-10-CM | POA: Insufficient documentation

## 2016-04-28 DIAGNOSIS — I509 Heart failure, unspecified: Secondary | ICD-10-CM | POA: Diagnosis not present

## 2016-04-28 DIAGNOSIS — K5900 Constipation, unspecified: Secondary | ICD-10-CM | POA: Insufficient documentation

## 2016-04-28 DIAGNOSIS — Z9981 Dependence on supplemental oxygen: Secondary | ICD-10-CM | POA: Diagnosis not present

## 2016-04-28 DIAGNOSIS — I4891 Unspecified atrial fibrillation: Secondary | ICD-10-CM | POA: Insufficient documentation

## 2016-04-28 DIAGNOSIS — I110 Hypertensive heart disease with heart failure: Secondary | ICD-10-CM | POA: Insufficient documentation

## 2016-04-28 DIAGNOSIS — E119 Type 2 diabetes mellitus without complications: Secondary | ICD-10-CM

## 2016-04-28 DIAGNOSIS — Z800 Family history of malignant neoplasm of digestive organs: Secondary | ICD-10-CM

## 2016-04-28 DIAGNOSIS — G4700 Insomnia, unspecified: Secondary | ICD-10-CM | POA: Diagnosis not present

## 2016-04-28 DIAGNOSIS — M25559 Pain in unspecified hip: Secondary | ICD-10-CM | POA: Diagnosis not present

## 2016-04-28 DIAGNOSIS — M25551 Pain in right hip: Secondary | ICD-10-CM | POA: Insufficient documentation

## 2016-04-28 DIAGNOSIS — C801 Malignant (primary) neoplasm, unspecified: Secondary | ICD-10-CM

## 2016-04-28 DIAGNOSIS — J449 Chronic obstructive pulmonary disease, unspecified: Secondary | ICD-10-CM | POA: Diagnosis not present

## 2016-04-28 DIAGNOSIS — R000 Tachycardia, unspecified: Secondary | ICD-10-CM | POA: Insufficient documentation

## 2016-04-28 DIAGNOSIS — M25552 Pain in left hip: Secondary | ICD-10-CM | POA: Insufficient documentation

## 2016-04-28 DIAGNOSIS — Z66 Do not resuscitate: Secondary | ICD-10-CM

## 2016-04-28 DIAGNOSIS — F17210 Nicotine dependence, cigarettes, uncomplicated: Secondary | ICD-10-CM | POA: Diagnosis not present

## 2016-04-28 DIAGNOSIS — Z510 Encounter for antineoplastic radiation therapy: Secondary | ICD-10-CM | POA: Diagnosis not present

## 2016-04-28 DIAGNOSIS — M25569 Pain in unspecified knee: Secondary | ICD-10-CM | POA: Insufficient documentation

## 2016-04-28 LAB — COMPREHENSIVE METABOLIC PANEL
ALT: 15 U/L — ABNORMAL LOW (ref 17–63)
AST: 22 U/L (ref 15–41)
Albumin: 4 g/dL (ref 3.5–5.0)
Alkaline Phosphatase: 49 U/L (ref 38–126)
Anion gap: 6 (ref 5–15)
BUN: 11 mg/dL (ref 6–20)
CO2: 30 mmol/L (ref 22–32)
Calcium: 9.6 mg/dL (ref 8.9–10.3)
Chloride: 99 mmol/L — ABNORMAL LOW (ref 101–111)
Creatinine, Ser: 0.57 mg/dL — ABNORMAL LOW (ref 0.61–1.24)
GFR calc Af Amer: >60 mL/min (ref >60)
GFR calc non Af Amer: >60 mL/min (ref >60)
Glucose, Bld: 118 mg/dL — ABNORMAL HIGH (ref 65–99)
Potassium: 4.2 mmol/L (ref 3.5–5.1)
Sodium: 135 mmol/L (ref 135–145)
Total Bilirubin: 0.3 mg/dL (ref 0.3–1.2)
Total Protein: 8.3 g/dL — ABNORMAL HIGH (ref 6.5–8.1)

## 2016-04-28 LAB — CBC WITH DIFFERENTIAL/PLATELET
Basophils Absolute: 0 10*3/uL (ref 0–0.1)
Basophils Relative: 0 %
Eosinophils Absolute: 0.2 10*3/uL (ref 0–0.7)
Eosinophils Relative: 3 %
HCT: 37.4 % — ABNORMAL LOW (ref 40.0–52.0)
Hemoglobin: 12.9 g/dL — ABNORMAL LOW (ref 13.0–18.0)
Lymphocytes Relative: 26 %
Lymphs Abs: 1.8 10*3/uL (ref 1.0–3.6)
MCH: 29.4 pg (ref 26.0–34.0)
MCHC: 34.4 g/dL (ref 32.0–36.0)
MCV: 85.5 fL (ref 80.0–100.0)
Monocytes Absolute: 0.7 10*3/uL (ref 0.2–1.0)
Monocytes Relative: 10 %
Neutro Abs: 4.4 10*3/uL (ref 1.4–6.5)
Neutrophils Relative %: 61 %
Platelets: 242 10*3/uL (ref 150–440)
RBC: 4.38 MIL/uL — ABNORMAL LOW (ref 4.40–5.90)
RDW: 13.4 % (ref 11.5–14.5)
WBC: 7.1 10*3/uL (ref 3.8–10.6)

## 2016-04-28 LAB — MAGNESIUM: Magnesium: 1.8 mg/dL (ref 1.7–2.4)

## 2016-04-28 MED ORDER — FAMOTIDINE IN NACL 20-0.9 MG/50ML-% IV SOLN: 20.0000 mg | Freq: Once | INTRAVENOUS | Status: DC

## 2016-04-28 MED ORDER — SODIUM CHLORIDE 0.9 % IV SOLN
45.0000 mg/m2 | Freq: Once | INTRAVENOUS | Status: DC
  Filled 2016-04-28: qty 15

## 2016-04-28 MED ORDER — PALONOSETRON HCL INJECTION 0.25 MG/5ML: 0.2500 mg | Freq: Once | INTRAVENOUS | Status: DC

## 2016-04-28 MED ORDER — SODIUM CHLORIDE 0.9 % IV SOLN
20.0000 mg | Freq: Once | INTRAVENOUS | Status: DC
  Administered 2016-04-28: 20 mg via INTRAVENOUS
  Filled 2016-04-28: qty 2

## 2016-04-28 MED ORDER — SODIUM CHLORIDE 0.9 % IV SOLN
45.0000 mg/m2 | Freq: Once | INTRAVENOUS | Status: AC
  Administered 2016-04-28: 90 mg via INTRAVENOUS
  Filled 2016-04-28: qty 15

## 2016-04-28 MED ORDER — DEXAMETHASONE SODIUM PHOSPHATE 100 MG/10ML IJ SOLN
20.0000 mg | Freq: Once | INTRAMUSCULAR | Status: AC
  Administered 2016-04-28: 20 mg via INTRAVENOUS
  Filled 2016-04-28: qty 2

## 2016-04-28 MED ORDER — FAMOTIDINE IN NACL 20-0.9 MG/50ML-% IV SOLN
20.0000 mg | Freq: Once | INTRAVENOUS | Status: AC
  Administered 2016-04-28: 20 mg via INTRAVENOUS

## 2016-04-28 MED ORDER — PALONOSETRON HCL INJECTION 0.25 MG/5ML
0.2500 mg | Freq: Once | INTRAVENOUS | Status: AC
  Administered 2016-04-28: 0.25 mg via INTRAVENOUS

## 2016-04-28 MED ORDER — DIPHENHYDRAMINE HCL 50 MG/ML IJ SOLN: 50.0000 mg | Freq: Once | INTRAMUSCULAR | Status: DC

## 2016-04-28 MED ORDER — SODIUM CHLORIDE 0.9 % IV SOLN
Freq: Once | INTRAVENOUS | Status: AC
  Administered 2016-04-28: 11:00:00 via INTRAVENOUS
  Filled 2016-04-28: qty 1000

## 2016-04-28 MED ORDER — DIPHENHYDRAMINE HCL 50 MG/ML IJ SOLN
50.0000 mg | Freq: Once | INTRAMUSCULAR | Status: AC
  Administered 2016-04-28: 50 mg via INTRAVENOUS

## 2016-04-29 ENCOUNTER — Ambulatory Visit
Admission: RE | Admit: 2016-04-29 | Discharge: 2016-04-29 | Disposition: A | Discharge status: Home / Self Care | Payer: Medicaid Other | Source: Ambulatory Visit | Attending: Radiation Oncology | Admitting: Radiation Oncology

## 2016-04-29 DIAGNOSIS — Z510 Encounter for antineoplastic radiation therapy: Secondary | ICD-10-CM | POA: Diagnosis not present

## 2016-04-30 ENCOUNTER — Ambulatory Visit
Admission: RE | Admit: 2016-04-30 | Discharge: 2016-04-30 | Disposition: A | Discharge status: Home / Self Care | Payer: Medicaid Other | Source: Ambulatory Visit | Attending: Radiation Oncology | Admitting: Radiation Oncology

## 2016-04-30 DIAGNOSIS — Z510 Encounter for antineoplastic radiation therapy: Secondary | ICD-10-CM | POA: Diagnosis not present

## 2016-05-01 ENCOUNTER — Ambulatory Visit
Admission: RE | Admit: 2016-05-01 | Discharge: 2016-05-01 | Disposition: A | Discharge status: Home / Self Care | Payer: Medicaid Other | Source: Ambulatory Visit | Attending: Radiation Oncology | Admitting: Radiation Oncology

## 2016-05-01 DIAGNOSIS — Z510 Encounter for antineoplastic radiation therapy: Secondary | ICD-10-CM | POA: Diagnosis not present

## 2016-05-04 ENCOUNTER — Ambulatory Visit
Admission: RE | Admit: 2016-05-04 | Discharge: 2016-05-04 | Disposition: A | Discharge status: Home / Self Care | Payer: Medicaid Other | Source: Ambulatory Visit | Attending: Radiation Oncology | Admitting: Radiation Oncology

## 2016-05-04 DIAGNOSIS — Z510 Encounter for antineoplastic radiation therapy: Secondary | ICD-10-CM | POA: Diagnosis not present

## 2016-05-05 ENCOUNTER — Inpatient Hospital Stay: Payer: Medicaid Other

## 2016-05-05 ENCOUNTER — Encounter: Payer: Self-pay | Admitting: Hematology and Oncology

## 2016-05-05 ENCOUNTER — Other Ambulatory Visit: Payer: Self-pay | Admitting: Hematology and Oncology

## 2016-05-05 ENCOUNTER — Inpatient Hospital Stay (HOSPITAL_BASED_OUTPATIENT_CLINIC_OR_DEPARTMENT_OTHER): Payer: Medicaid Other | Admitting: Hematology and Oncology

## 2016-05-05 ENCOUNTER — Ambulatory Visit
Admission: RE | Admit: 2016-05-05 | Discharge: 2016-05-05 | Disposition: A | Discharge status: Home / Self Care | Payer: Medicaid Other | Source: Ambulatory Visit | Attending: Radiation Oncology | Admitting: Radiation Oncology

## 2016-05-05 VITALS — BP 110/76 | HR 124 | Temp 99.2°F | Resp 18 | Wt 170.0 lb

## 2016-05-05 DIAGNOSIS — Z5111 Encounter for antineoplastic chemotherapy: Secondary | ICD-10-CM | POA: Insufficient documentation

## 2016-05-05 DIAGNOSIS — M25569 Pain in unspecified knee: Secondary | ICD-10-CM

## 2016-05-05 DIAGNOSIS — M545 Low back pain: Secondary | ICD-10-CM

## 2016-05-05 DIAGNOSIS — Z510 Encounter for antineoplastic radiation therapy: Secondary | ICD-10-CM | POA: Diagnosis not present

## 2016-05-05 DIAGNOSIS — Z800 Family history of malignant neoplasm of digestive organs: Secondary | ICD-10-CM

## 2016-05-05 DIAGNOSIS — C3431 Malignant neoplasm of lower lobe, right bronchus or lung: Secondary | ICD-10-CM

## 2016-05-05 DIAGNOSIS — R531 Weakness: Secondary | ICD-10-CM

## 2016-05-05 DIAGNOSIS — R454 Irritability and anger: Secondary | ICD-10-CM | POA: Diagnosis not present

## 2016-05-05 DIAGNOSIS — F17210 Nicotine dependence, cigarettes, uncomplicated: Secondary | ICD-10-CM

## 2016-05-05 DIAGNOSIS — R000 Tachycardia, unspecified: Secondary | ICD-10-CM | POA: Diagnosis not present

## 2016-05-05 DIAGNOSIS — R634 Abnormal weight loss: Secondary | ICD-10-CM

## 2016-05-05 DIAGNOSIS — I2510 Atherosclerotic heart disease of native coronary artery without angina pectoris: Secondary | ICD-10-CM

## 2016-05-05 DIAGNOSIS — I4891 Unspecified atrial fibrillation: Secondary | ICD-10-CM

## 2016-05-05 DIAGNOSIS — Z79899 Other long term (current) drug therapy: Secondary | ICD-10-CM

## 2016-05-05 DIAGNOSIS — J449 Chronic obstructive pulmonary disease, unspecified: Secondary | ICD-10-CM | POA: Diagnosis not present

## 2016-05-05 DIAGNOSIS — G8929 Other chronic pain: Secondary | ICD-10-CM

## 2016-05-05 DIAGNOSIS — K5900 Constipation, unspecified: Secondary | ICD-10-CM

## 2016-05-05 DIAGNOSIS — I509 Heart failure, unspecified: Secondary | ICD-10-CM

## 2016-05-05 DIAGNOSIS — Z7984 Long term (current) use of oral hypoglycemic drugs: Secondary | ICD-10-CM

## 2016-05-05 DIAGNOSIS — I110 Hypertensive heart disease with heart failure: Secondary | ICD-10-CM | POA: Diagnosis not present

## 2016-05-05 DIAGNOSIS — Z66 Do not resuscitate: Secondary | ICD-10-CM

## 2016-05-05 DIAGNOSIS — F419 Anxiety disorder, unspecified: Secondary | ICD-10-CM

## 2016-05-05 DIAGNOSIS — R51 Headache: Secondary | ICD-10-CM | POA: Diagnosis not present

## 2016-05-05 DIAGNOSIS — Z9981 Dependence on supplemental oxygen: Secondary | ICD-10-CM

## 2016-05-05 DIAGNOSIS — E119 Type 2 diabetes mellitus without complications: Secondary | ICD-10-CM

## 2016-05-05 DIAGNOSIS — M25559 Pain in unspecified hip: Secondary | ICD-10-CM

## 2016-05-05 LAB — CBC WITH DIFFERENTIAL/PLATELET
BASOS ABS: 0 10*3/uL (ref 0–0.1)
BASOS PCT: 0 %
Eosinophils Absolute: 0.1 10*3/uL (ref 0–0.7)
Eosinophils Relative: 2 %
HEMATOCRIT: 35 % — AB (ref 40.0–52.0)
HEMOGLOBIN: 12.2 g/dL — AB (ref 13.0–18.0)
Lymphocytes Relative: 17 %
Lymphs Abs: 0.9 10*3/uL — ABNORMAL LOW (ref 1.0–3.6)
MCH: 29.6 pg (ref 26.0–34.0)
MCHC: 34.8 g/dL (ref 32.0–36.0)
MCV: 85 fL (ref 80.0–100.0)
MONO ABS: 0.7 10*3/uL (ref 0.2–1.0)
Monocytes Relative: 12 %
NEUTROS ABS: 3.9 10*3/uL (ref 1.4–6.5)
NEUTROS PCT: 69 %
Platelets: 233 10*3/uL (ref 150–440)
RBC: 4.12 MIL/uL — AB (ref 4.40–5.90)
RDW: 13.5 % (ref 11.5–14.5)
WBC: 5.6 10*3/uL (ref 3.8–10.6)

## 2016-05-05 LAB — COMPREHENSIVE METABOLIC PANEL
ALBUMIN: 3.7 g/dL (ref 3.5–5.0)
ALT: 33 U/L (ref 17–63)
AST: 34 U/L (ref 15–41)
Alkaline Phosphatase: 44 U/L (ref 38–126)
Anion gap: 6 (ref 5–15)
BILIRUBIN TOTAL: 0.5 mg/dL (ref 0.3–1.2)
BUN: 11 mg/dL (ref 6–20)
CO2: 31 mmol/L (ref 22–32)
Calcium: 9.2 mg/dL (ref 8.9–10.3)
Chloride: 96 mmol/L — ABNORMAL LOW (ref 101–111)
Creatinine, Ser: 0.66 mg/dL (ref 0.61–1.24)
GFR calc Af Amer: >60 mL/min (ref >60)
GFR calc non Af Amer: >60 mL/min (ref >60)
GLUCOSE: 128 mg/dL — AB (ref 65–99)
POTASSIUM: 4.1 mmol/L (ref 3.5–5.1)
Sodium: 133 mmol/L — ABNORMAL LOW (ref 135–145)
TOTAL PROTEIN: 8 g/dL (ref 6.5–8.1)

## 2016-05-05 LAB — MAGNESIUM: MAGNESIUM: 1.8 mg/dL (ref 1.7–2.4)

## 2016-05-05 MED ORDER — IPRATROPIUM-ALBUTEROL 0.5-2.5 (3) MG/3ML IN SOLN
RESPIRATORY_TRACT | Status: AC
  Filled 2016-05-05: qty 3

## 2016-05-05 MED ORDER — FAMOTIDINE IN NACL 20-0.9 MG/50ML-% IV SOLN
20.0000 mg | Freq: Once | INTRAVENOUS | Status: AC
  Administered 2016-05-05: 20 mg via INTRAVENOUS
  Filled 2016-05-05: qty 50

## 2016-05-05 MED ORDER — SODIUM CHLORIDE 0.9 % IV SOLN
45.0000 mg/m2 | Freq: Once | INTRAVENOUS | Status: AC
  Administered 2016-05-05: 90 mg via INTRAVENOUS
  Filled 2016-05-05: qty 15

## 2016-05-05 MED ORDER — POLYETHYLENE GLYCOL 3350 17 G PO PACK: 17.0000 g | PACK | Freq: Every day | ORAL | Status: DC | PRN

## 2016-05-05 MED ORDER — DIPHENHYDRAMINE HCL 50 MG/ML IJ SOLN
50.0000 mg | Freq: Once | INTRAMUSCULAR | Status: AC
  Administered 2016-05-05: 50 mg via INTRAVENOUS
  Filled 2016-05-05: qty 1

## 2016-05-05 MED ORDER — SODIUM CHLORIDE 0.9 % IV SOLN
278.6000 mg | Freq: Once | INTRAVENOUS | Status: AC
  Administered 2016-05-05: 280 mg via INTRAVENOUS
  Filled 2016-05-05: qty 28

## 2016-05-05 MED ORDER — PALONOSETRON HCL INJECTION 0.25 MG/5ML
0.2500 mg | Freq: Once | INTRAVENOUS | Status: AC
  Administered 2016-05-05: 0.25 mg via INTRAVENOUS
  Filled 2016-05-05: qty 5

## 2016-05-05 MED ORDER — SODIUM CHLORIDE 0.9 % IV SOLN
Freq: Once | INTRAVENOUS | Status: AC
  Administered 2016-05-05: 13:00:00 via INTRAVENOUS
  Filled 2016-05-05: qty 1000

## 2016-05-05 MED ORDER — DOCUSATE SODIUM 50 MG PO CAPS: 100.0000 mg | ORAL_CAPSULE | Freq: Every day | ORAL | 0 refills | Status: AC | PRN

## 2016-05-05 MED ORDER — SODIUM CHLORIDE 0.9 % IV SOLN
20.0000 mg | Freq: Once | INTRAVENOUS | Status: AC
  Administered 2016-05-05: 20 mg via INTRAVENOUS
  Filled 2016-05-05: qty 2

## 2016-05-05 MED ORDER — IPRATROPIUM-ALBUTEROL 0.5-2.5 (3) MG/3ML IN SOLN
3.0000 mL | Freq: Once | RESPIRATORY_TRACT | Status: AC
  Administered 2016-05-05: 3 mL via RESPIRATORY_TRACT

## 2016-05-05 NOTE — Progress Notes (Signed)
Patient is not sleeping or eating well.  Here today for chemo treatment.  HR elevated 133.

## 2016-05-05 NOTE — Addendum Note (Signed)
Addended by: Mallie Snooks I on: 05/05/2016 04:20 PM   Modules accepted: Orders

## 2016-05-05 NOTE — Progress Notes (Signed)
1633-Pt reports feeling better after receiving Duoneb. Discharged from facility.
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
## 2016-05-05 NOTE — Progress Notes (Signed)
1625-Pt c/o shortness of breath, requesting "breathing treatment". States he left his inhaler at home. Order received by Dr Janese Banks for DuoNeb.

## 2016-05-06 ENCOUNTER — Ambulatory Visit
Admission: RE | Admit: 2016-05-06 | Discharge: 2016-05-06 | Disposition: A | Discharge status: Home / Self Care | Payer: Medicaid Other | Source: Ambulatory Visit | Attending: Radiation Oncology | Admitting: Radiation Oncology

## 2016-05-06 DIAGNOSIS — Z510 Encounter for antineoplastic radiation therapy: Secondary | ICD-10-CM | POA: Diagnosis not present

## 2016-05-07 ENCOUNTER — Ambulatory Visit
Admission: RE | Admit: 2016-05-07 | Discharge: 2016-05-07 | Disposition: A | Discharge status: Home / Self Care | Payer: Medicaid Other | Source: Ambulatory Visit | Attending: Radiation Oncology | Admitting: Radiation Oncology

## 2016-05-07 DIAGNOSIS — Z510 Encounter for antineoplastic radiation therapy: Secondary | ICD-10-CM | POA: Diagnosis not present

## 2016-05-08 ENCOUNTER — Ambulatory Visit
Admission: RE | Admit: 2016-05-08 | Discharge: 2016-05-08 | Disposition: A | Discharge status: Home / Self Care | Payer: Medicaid Other | Source: Ambulatory Visit | Attending: Radiation Oncology | Admitting: Radiation Oncology

## 2016-05-08 DIAGNOSIS — Z510 Encounter for antineoplastic radiation therapy: Secondary | ICD-10-CM | POA: Diagnosis not present

## 2016-05-11 ENCOUNTER — Ambulatory Visit
Admission: RE | Admit: 2016-05-11 | Discharge: 2016-05-11 | Disposition: A | Discharge status: Home / Self Care | Payer: Medicaid Other | Source: Ambulatory Visit | Attending: Radiation Oncology | Admitting: Radiation Oncology

## 2016-05-11 DIAGNOSIS — Z510 Encounter for antineoplastic radiation therapy: Secondary | ICD-10-CM | POA: Diagnosis not present

## 2016-05-12 ENCOUNTER — Inpatient Hospital Stay: Payer: Medicaid Other

## 2016-05-12 ENCOUNTER — Other Ambulatory Visit: Payer: Self-pay | Admitting: Hematology and Oncology

## 2016-05-12 ENCOUNTER — Encounter: Payer: Self-pay | Admitting: Hematology and Oncology

## 2016-05-12 ENCOUNTER — Ambulatory Visit
Admission: RE | Admit: 2016-05-12 | Discharge: 2016-05-12 | Disposition: A | Discharge status: Home / Self Care | Payer: Medicaid Other | Source: Ambulatory Visit | Attending: Radiation Oncology | Admitting: Radiation Oncology

## 2016-05-12 ENCOUNTER — Inpatient Hospital Stay (HOSPITAL_BASED_OUTPATIENT_CLINIC_OR_DEPARTMENT_OTHER): Payer: Medicaid Other | Admitting: Hematology and Oncology

## 2016-05-12 VITALS — BP 120/82 | HR 128 | Temp 96.2°F | Wt 166.1 lb

## 2016-05-12 DIAGNOSIS — Z5111 Encounter for antineoplastic chemotherapy: Secondary | ICD-10-CM

## 2016-05-12 DIAGNOSIS — C3431 Malignant neoplasm of lower lobe, right bronchus or lung: Secondary | ICD-10-CM

## 2016-05-12 DIAGNOSIS — Z79899 Other long term (current) drug therapy: Secondary | ICD-10-CM

## 2016-05-12 DIAGNOSIS — F419 Anxiety disorder, unspecified: Secondary | ICD-10-CM

## 2016-05-12 DIAGNOSIS — F17210 Nicotine dependence, cigarettes, uncomplicated: Secondary | ICD-10-CM | POA: Diagnosis not present

## 2016-05-12 DIAGNOSIS — R531 Weakness: Secondary | ICD-10-CM | POA: Diagnosis not present

## 2016-05-12 DIAGNOSIS — R110 Nausea: Secondary | ICD-10-CM

## 2016-05-12 DIAGNOSIS — R5383 Other fatigue: Secondary | ICD-10-CM

## 2016-05-12 DIAGNOSIS — R000 Tachycardia, unspecified: Secondary | ICD-10-CM

## 2016-05-12 DIAGNOSIS — G8929 Other chronic pain: Secondary | ICD-10-CM

## 2016-05-12 DIAGNOSIS — Z66 Do not resuscitate: Secondary | ICD-10-CM

## 2016-05-12 DIAGNOSIS — I4891 Unspecified atrial fibrillation: Secondary | ICD-10-CM

## 2016-05-12 DIAGNOSIS — M25551 Pain in right hip: Secondary | ICD-10-CM

## 2016-05-12 DIAGNOSIS — Z9981 Dependence on supplemental oxygen: Secondary | ICD-10-CM

## 2016-05-12 DIAGNOSIS — M25569 Pain in unspecified knee: Secondary | ICD-10-CM

## 2016-05-12 DIAGNOSIS — E119 Type 2 diabetes mellitus without complications: Secondary | ICD-10-CM

## 2016-05-12 DIAGNOSIS — K5900 Constipation, unspecified: Secondary | ICD-10-CM

## 2016-05-12 DIAGNOSIS — R630 Anorexia: Secondary | ICD-10-CM | POA: Diagnosis not present

## 2016-05-12 DIAGNOSIS — I110 Hypertensive heart disease with heart failure: Secondary | ICD-10-CM

## 2016-05-12 DIAGNOSIS — I2510 Atherosclerotic heart disease of native coronary artery without angina pectoris: Secondary | ICD-10-CM

## 2016-05-12 DIAGNOSIS — Z510 Encounter for antineoplastic radiation therapy: Secondary | ICD-10-CM | POA: Diagnosis not present

## 2016-05-12 DIAGNOSIS — M545 Low back pain: Secondary | ICD-10-CM

## 2016-05-12 DIAGNOSIS — R454 Irritability and anger: Secondary | ICD-10-CM

## 2016-05-12 DIAGNOSIS — R634 Abnormal weight loss: Secondary | ICD-10-CM | POA: Diagnosis not present

## 2016-05-12 DIAGNOSIS — J449 Chronic obstructive pulmonary disease, unspecified: Secondary | ICD-10-CM | POA: Diagnosis not present

## 2016-05-12 DIAGNOSIS — Z7984 Long term (current) use of oral hypoglycemic drugs: Secondary | ICD-10-CM

## 2016-05-12 DIAGNOSIS — G4700 Insomnia, unspecified: Secondary | ICD-10-CM

## 2016-05-12 DIAGNOSIS — Z800 Family history of malignant neoplasm of digestive organs: Secondary | ICD-10-CM

## 2016-05-12 LAB — COMPREHENSIVE METABOLIC PANEL
ALT: 80 U/L — ABNORMAL HIGH (ref 17–63)
AST: 59 U/L — ABNORMAL HIGH (ref 15–41)
Albumin: 3.4 g/dL — ABNORMAL LOW (ref 3.5–5.0)
Alkaline Phosphatase: 42 U/L (ref 38–126)
Anion gap: 6 (ref 5–15)
BUN: 10 mg/dL (ref 6–20)
CO2: 31 mmol/L (ref 22–32)
Calcium: 8.9 mg/dL (ref 8.9–10.3)
Chloride: 99 mmol/L — ABNORMAL LOW (ref 101–111)
Creatinine, Ser: 0.48 mg/dL — ABNORMAL LOW (ref 0.61–1.24)
GFR calc Af Amer: >60 mL/min (ref >60)
GFR calc non Af Amer: >60 mL/min (ref >60)
Glucose, Bld: 188 mg/dL — ABNORMAL HIGH (ref 65–99)
Potassium: 4 mmol/L (ref 3.5–5.1)
Sodium: 136 mmol/L (ref 135–145)
Total Bilirubin: 0.3 mg/dL (ref 0.3–1.2)
Total Protein: 7.4 g/dL (ref 6.5–8.1)

## 2016-05-12 LAB — CBC WITH DIFFERENTIAL/PLATELET
Basophils Absolute: 0 10*3/uL (ref 0–0.1)
Basophils Relative: 1 %
Eosinophils Absolute: 0.1 10*3/uL (ref 0–0.7)
Eosinophils Relative: 2 %
HCT: 32.3 % — ABNORMAL LOW (ref 40.0–52.0)
Hemoglobin: 11.2 g/dL — ABNORMAL LOW (ref 13.0–18.0)
Lymphocytes Relative: 20 %
Lymphs Abs: 0.6 10*3/uL — ABNORMAL LOW (ref 1.0–3.6)
MCH: 29.5 pg (ref 26.0–34.0)
MCHC: 34.7 g/dL (ref 32.0–36.0)
MCV: 85.2 fL (ref 80.0–100.0)
Monocytes Absolute: 0.3 10*3/uL (ref 0.2–1.0)
Monocytes Relative: 10 %
Neutro Abs: 2 10*3/uL (ref 1.4–6.5)
Neutrophils Relative %: 67 %
Platelets: 179 10*3/uL (ref 150–440)
RBC: 3.79 MIL/uL — ABNORMAL LOW (ref 4.40–5.90)
RDW: 13.8 % (ref 11.5–14.5)
WBC: 2.9 10*3/uL — ABNORMAL LOW (ref 3.8–10.6)

## 2016-05-12 LAB — MAGNESIUM: Magnesium: 1.7 mg/dL (ref 1.7–2.4)

## 2016-05-12 MED ORDER — PACLITAXEL CHEMO INJECTION 300 MG/50ML
45.0000 mg/m2 | Freq: Once | INTRAVENOUS | Status: AC
  Administered 2016-05-12: 90 mg via INTRAVENOUS
  Filled 2016-05-12: qty 15

## 2016-05-12 MED ORDER — DIPHENHYDRAMINE HCL 50 MG/ML IJ SOLN
50.0000 mg | Freq: Once | INTRAMUSCULAR | Status: AC
  Administered 2016-05-12: 50 mg via INTRAVENOUS
  Filled 2016-05-12: qty 1

## 2016-05-12 MED ORDER — SODIUM CHLORIDE 0.9 % IV SOLN
Freq: Once | INTRAVENOUS | Status: AC
  Administered 2016-05-12: 12:00:00 via INTRAVENOUS
  Filled 2016-05-12: qty 1000

## 2016-05-12 MED ORDER — SODIUM CHLORIDE 0.9 % IV SOLN
20.0000 mg | Freq: Once | INTRAVENOUS | Status: AC
  Administered 2016-05-12: 20 mg via INTRAVENOUS
  Filled 2016-05-12: qty 2

## 2016-05-12 MED ORDER — SODIUM CHLORIDE 0.9 % IV SOLN
170.0000 mg | Freq: Once | INTRAVENOUS | Status: AC
  Administered 2016-05-12: 170 mg via INTRAVENOUS
  Filled 2016-05-12: qty 17

## 2016-05-12 MED ORDER — FAMOTIDINE IN NACL 20-0.9 MG/50ML-% IV SOLN
20.0000 mg | Freq: Once | INTRAVENOUS | Status: AC
  Administered 2016-05-12: 20 mg via INTRAVENOUS
  Filled 2016-05-12: qty 50

## 2016-05-12 MED ORDER — PALONOSETRON HCL INJECTION 0.25 MG/5ML
0.2500 mg | Freq: Once | INTRAVENOUS | Status: AC
  Administered 2016-05-12: 0.25 mg via INTRAVENOUS
  Filled 2016-05-12: qty 5

## 2016-05-12 NOTE — Progress Notes (Signed)
Patient states he is having right sided back pain.  Not sleeping well. Appetite about half of what it was.  Does drink boost.  HR 128
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
## 2016-05-12 NOTE — Progress Notes (Signed)
MD is decreasing dose of carboplatin due to decrease in blood counts. Calculated CrCl for carboplatin is 153m/min which gives a dose of '280mg'$ , however MD wants to use CrCL of 675mmin to decrease dose to '170mg'$  in this patient. MD will determine response by patient at next visit to decide on future dose adjustments.

## 2016-05-13 ENCOUNTER — Ambulatory Visit
Admission: RE | Admit: 2016-05-13 | Discharge: 2016-05-13 | Disposition: A | Discharge status: Home / Self Care | Payer: Medicaid Other | Source: Ambulatory Visit | Attending: Radiation Oncology | Admitting: Radiation Oncology

## 2016-05-13 DIAGNOSIS — Z510 Encounter for antineoplastic radiation therapy: Secondary | ICD-10-CM | POA: Diagnosis not present

## 2016-05-14 ENCOUNTER — Ambulatory Visit
Admission: RE | Admit: 2016-05-14 | Discharge: 2016-05-14 | Disposition: A | Discharge status: Home / Self Care | Payer: Medicaid Other | Source: Ambulatory Visit | Attending: Radiation Oncology | Admitting: Radiation Oncology

## 2016-05-14 ENCOUNTER — Ambulatory Visit: Payer: Medicaid Other | Admitting: Hematology and Oncology

## 2016-05-14 DIAGNOSIS — Z510 Encounter for antineoplastic radiation therapy: Secondary | ICD-10-CM | POA: Diagnosis not present

## 2016-05-15 ENCOUNTER — Ambulatory Visit
Admission: RE | Admit: 2016-05-15 | Discharge: 2016-05-15 | Disposition: A | Discharge status: Home / Self Care | Payer: Medicaid Other | Source: Ambulatory Visit | Attending: Radiation Oncology | Admitting: Radiation Oncology

## 2016-05-15 DIAGNOSIS — Z510 Encounter for antineoplastic radiation therapy: Secondary | ICD-10-CM | POA: Diagnosis not present

## 2016-05-18 ENCOUNTER — Other Ambulatory Visit: Payer: Self-pay | Admitting: Hematology and Oncology

## 2016-05-18 ENCOUNTER — Ambulatory Visit
Admission: RE | Admit: 2016-05-18 | Discharge: 2016-05-18 | Disposition: A | Discharge status: Home / Self Care | Payer: Medicaid Other | Source: Ambulatory Visit | Attending: Radiation Oncology | Admitting: Radiation Oncology

## 2016-05-18 DIAGNOSIS — C3431 Malignant neoplasm of lower lobe, right bronchus or lung: Secondary | ICD-10-CM

## 2016-05-18 DIAGNOSIS — Z510 Encounter for antineoplastic radiation therapy: Secondary | ICD-10-CM | POA: Diagnosis not present

## 2016-05-18 DIAGNOSIS — R634 Abnormal weight loss: Secondary | ICD-10-CM

## 2016-05-19 ENCOUNTER — Inpatient Hospital Stay: Payer: Medicaid Other

## 2016-05-19 ENCOUNTER — Other Ambulatory Visit: Payer: Self-pay | Admitting: Hematology and Oncology

## 2016-05-19 ENCOUNTER — Inpatient Hospital Stay (HOSPITAL_BASED_OUTPATIENT_CLINIC_OR_DEPARTMENT_OTHER): Payer: Medicaid Other | Admitting: Hematology and Oncology

## 2016-05-19 ENCOUNTER — Ambulatory Visit
Admission: RE | Admit: 2016-05-19 | Discharge: 2016-05-19 | Disposition: A | Discharge status: Home / Self Care | Payer: Medicaid Other | Source: Ambulatory Visit | Attending: Radiation Oncology | Admitting: Radiation Oncology

## 2016-05-19 VITALS — BP 119/81 | HR 132 | Temp 97.6°F | Resp 18 | Wt 164.1 lb

## 2016-05-19 DIAGNOSIS — M25552 Pain in left hip: Secondary | ICD-10-CM

## 2016-05-19 DIAGNOSIS — I2510 Atherosclerotic heart disease of native coronary artery without angina pectoris: Secondary | ICD-10-CM

## 2016-05-19 DIAGNOSIS — R634 Abnormal weight loss: Secondary | ICD-10-CM

## 2016-05-19 DIAGNOSIS — R5383 Other fatigue: Secondary | ICD-10-CM

## 2016-05-19 DIAGNOSIS — F419 Anxiety disorder, unspecified: Secondary | ICD-10-CM

## 2016-05-19 DIAGNOSIS — R7989 Other specified abnormal findings of blood chemistry: Secondary | ICD-10-CM | POA: Diagnosis not present

## 2016-05-19 DIAGNOSIS — Z79899 Other long term (current) drug therapy: Secondary | ICD-10-CM

## 2016-05-19 DIAGNOSIS — D72819 Decreased white blood cell count, unspecified: Secondary | ICD-10-CM | POA: Diagnosis not present

## 2016-05-19 DIAGNOSIS — C3431 Malignant neoplasm of lower lobe, right bronchus or lung: Secondary | ICD-10-CM

## 2016-05-19 DIAGNOSIS — R945 Abnormal results of liver function studies: Secondary | ICD-10-CM

## 2016-05-19 DIAGNOSIS — Z510 Encounter for antineoplastic radiation therapy: Secondary | ICD-10-CM | POA: Diagnosis not present

## 2016-05-19 DIAGNOSIS — Z9981 Dependence on supplemental oxygen: Secondary | ICD-10-CM | POA: Diagnosis not present

## 2016-05-19 DIAGNOSIS — R531 Weakness: Secondary | ICD-10-CM

## 2016-05-19 DIAGNOSIS — Z66 Do not resuscitate: Secondary | ICD-10-CM

## 2016-05-19 DIAGNOSIS — F17210 Nicotine dependence, cigarettes, uncomplicated: Secondary | ICD-10-CM

## 2016-05-19 DIAGNOSIS — Z7984 Long term (current) use of oral hypoglycemic drugs: Secondary | ICD-10-CM

## 2016-05-19 DIAGNOSIS — Z5111 Encounter for antineoplastic chemotherapy: Secondary | ICD-10-CM | POA: Diagnosis not present

## 2016-05-19 DIAGNOSIS — Z800 Family history of malignant neoplasm of digestive organs: Secondary | ICD-10-CM

## 2016-05-19 DIAGNOSIS — M25569 Pain in unspecified knee: Secondary | ICD-10-CM

## 2016-05-19 DIAGNOSIS — G8929 Other chronic pain: Secondary | ICD-10-CM

## 2016-05-19 DIAGNOSIS — G4700 Insomnia, unspecified: Secondary | ICD-10-CM

## 2016-05-19 DIAGNOSIS — E119 Type 2 diabetes mellitus without complications: Secondary | ICD-10-CM

## 2016-05-19 DIAGNOSIS — R630 Anorexia: Secondary | ICD-10-CM

## 2016-05-19 DIAGNOSIS — J449 Chronic obstructive pulmonary disease, unspecified: Secondary | ICD-10-CM

## 2016-05-19 DIAGNOSIS — T451X5A Adverse effect of antineoplastic and immunosuppressive drugs, initial encounter: Secondary | ICD-10-CM

## 2016-05-19 DIAGNOSIS — I509 Heart failure, unspecified: Secondary | ICD-10-CM

## 2016-05-19 DIAGNOSIS — I110 Hypertensive heart disease with heart failure: Secondary | ICD-10-CM

## 2016-05-19 DIAGNOSIS — D701 Agranulocytosis secondary to cancer chemotherapy: Secondary | ICD-10-CM

## 2016-05-19 DIAGNOSIS — I4891 Unspecified atrial fibrillation: Secondary | ICD-10-CM

## 2016-05-19 DIAGNOSIS — R454 Irritability and anger: Secondary | ICD-10-CM | POA: Diagnosis not present

## 2016-05-19 DIAGNOSIS — M545 Low back pain: Secondary | ICD-10-CM

## 2016-05-19 LAB — CBC WITH DIFFERENTIAL/PLATELET
Basophils Absolute: 0 10*3/uL (ref 0–0.1)
Basophils Relative: 1 %
Eosinophils Absolute: 0 10*3/uL (ref 0–0.7)
Eosinophils Relative: 1 %
HCT: 33 % — ABNORMAL LOW (ref 40.0–52.0)
Hemoglobin: 11.3 g/dL — ABNORMAL LOW (ref 13.0–18.0)
Lymphocytes Relative: 20 %
Lymphs Abs: 0.4 10*3/uL — ABNORMAL LOW (ref 1.0–3.6)
MCH: 29.3 pg (ref 26.0–34.0)
MCHC: 34.3 g/dL (ref 32.0–36.0)
MCV: 85.6 fL (ref 80.0–100.0)
Monocytes Absolute: 0.3 10*3/uL (ref 0.2–1.0)
Monocytes Relative: 15 %
Neutro Abs: 1.4 10*3/uL (ref 1.4–6.5)
Neutrophils Relative %: 63 %
Platelets: 242 10*3/uL (ref 150–440)
RBC: 3.86 MIL/uL — ABNORMAL LOW (ref 4.40–5.90)
RDW: 13.9 % (ref 11.5–14.5)
WBC: 2.2 10*3/uL — ABNORMAL LOW (ref 3.8–10.6)

## 2016-05-19 LAB — COMPREHENSIVE METABOLIC PANEL
ALT: 165 U/L — ABNORMAL HIGH (ref 17–63)
AST: 110 U/L — ABNORMAL HIGH (ref 15–41)
Albumin: 3.5 g/dL (ref 3.5–5.0)
Alkaline Phosphatase: 46 U/L (ref 38–126)
Anion gap: 5 (ref 5–15)
BUN: 9 mg/dL (ref 6–20)
CO2: 30 mmol/L (ref 22–32)
Calcium: 9.1 mg/dL (ref 8.9–10.3)
Chloride: 100 mmol/L — ABNORMAL LOW (ref 101–111)
Creatinine, Ser: 0.64 mg/dL (ref 0.61–1.24)
GFR calc Af Amer: >60 mL/min (ref >60)
GFR calc non Af Amer: >60 mL/min (ref >60)
Glucose, Bld: 171 mg/dL — ABNORMAL HIGH (ref 65–99)
Potassium: 4 mmol/L (ref 3.5–5.1)
Sodium: 135 mmol/L (ref 135–145)
Total Bilirubin: 0.5 mg/dL (ref 0.3–1.2)
Total Protein: 7.3 g/dL (ref 6.5–8.1)

## 2016-05-19 LAB — MAGNESIUM: Magnesium: 1.6 mg/dL — ABNORMAL LOW (ref 1.7–2.4)

## 2016-05-19 MED ORDER — SODIUM CHLORIDE 0.9 % IV SOLN
1.0000 g | Freq: Once | INTRAVENOUS | Status: AC
  Administered 2016-05-19: 1 g via INTRAVENOUS
  Filled 2016-05-19: qty 2

## 2016-05-19 MED ORDER — SODIUM CHLORIDE 0.9 % IV SOLN
Freq: Once | INTRAVENOUS | Status: AC
  Administered 2016-05-19: 11:00:00 via INTRAVENOUS
  Filled 2016-05-19: qty 1000
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
## 2016-05-19 NOTE — Progress Notes (Signed)
Patient states he is not sleeping well and his appetite is decreased.

## 2016-05-20 ENCOUNTER — Other Ambulatory Visit: Payer: Self-pay | Admitting: *Deleted

## 2016-05-20 ENCOUNTER — Ambulatory Visit
Admission: RE | Admit: 2016-05-20 | Discharge: 2016-05-20 | Disposition: A | Discharge status: Home / Self Care | Payer: Medicaid Other | Source: Ambulatory Visit | Attending: Radiation Oncology | Admitting: Radiation Oncology

## 2016-05-20 ENCOUNTER — Telehealth: Payer: Self-pay | Admitting: *Deleted

## 2016-05-20 DIAGNOSIS — Z510 Encounter for antineoplastic radiation therapy: Secondary | ICD-10-CM | POA: Diagnosis not present

## 2016-05-20 DIAGNOSIS — C3431 Malignant neoplasm of lower lobe, right bronchus or lung: Secondary | ICD-10-CM

## 2016-05-20 DIAGNOSIS — B1910 Unspecified viral hepatitis B without hepatic coma: Secondary | ICD-10-CM

## 2016-05-20 LAB — HEPATITIS B CORE ANTIBODY, TOTAL: Hep B Core Total Ab: POSITIVE — AB

## 2016-05-20 LAB — HEPATITIS C ANTIBODY: HCV Ab: >11 s/co ratio — ABNORMAL HIGH (ref 0.0–0.9)

## 2016-05-20 LAB — HEPATITIS B SURFACE ANTIGEN: Hepatitis B Surface Ag: NEGATIVE

## 2016-05-20 NOTE — Telephone Encounter (Signed)
Called Mariann Laster Phiillips, RN for facility to inform her that patient has tested positive for Hepatitis B & C.  Also informed her that patient will have labs tomorrow morning when he comes for radiation.

## 2016-05-20 NOTE — Telephone Encounter (Signed)
-----   Message from Lequita Asal, MD sent at 05/20/2016  9:03 AM EDT ----- Regarding: New labs   Patient is both hep B and C positive by testing yesterday.  Needs to send hepatitis testing by PCR. Referral to Dr Allen Norris.  M  ----- Message ----- From: Interface, Lab In [place] Sent: 05/20/2016   2:37 AM To: Lequita Asal, MD

## 2016-05-21 ENCOUNTER — Inpatient Hospital Stay: Payer: Medicaid Other

## 2016-05-21 ENCOUNTER — Ambulatory Visit
Admission: RE | Admit: 2016-05-21 | Discharge: 2016-05-21 | Disposition: A | Discharge status: Home / Self Care | Payer: Medicaid Other | Source: Ambulatory Visit | Attending: Radiation Oncology | Admitting: Radiation Oncology

## 2016-05-21 ENCOUNTER — Other Ambulatory Visit: Payer: Self-pay | Admitting: *Deleted

## 2016-05-21 DIAGNOSIS — C3431 Malignant neoplasm of lower lobe, right bronchus or lung: Secondary | ICD-10-CM

## 2016-05-21 DIAGNOSIS — B1910 Unspecified viral hepatitis B without hepatic coma: Secondary | ICD-10-CM

## 2016-05-21 DIAGNOSIS — Z510 Encounter for antineoplastic radiation therapy: Secondary | ICD-10-CM | POA: Diagnosis not present

## 2016-05-21 DIAGNOSIS — Z5111 Encounter for antineoplastic chemotherapy: Secondary | ICD-10-CM | POA: Diagnosis not present

## 2016-05-22 ENCOUNTER — Ambulatory Visit
Admission: RE | Admit: 2016-05-22 | Discharge: 2016-05-22 | Disposition: A | Discharge status: Home / Self Care | Payer: Medicaid Other | Source: Ambulatory Visit | Attending: Radiation Oncology | Admitting: Radiation Oncology

## 2016-05-22 DIAGNOSIS — Z510 Encounter for antineoplastic radiation therapy: Secondary | ICD-10-CM | POA: Diagnosis not present

## 2016-05-23 LAB — HEPATITIS B DNA, ULTRAQUANTITATIVE, PCR
HBV DNA SERPL PCR-ACNC: NOT DETECTED [IU]/mL
HBV DNA SERPL PCR-LOG IU: UNDETERMINED {Log_IU}/mL

## 2016-05-23 LAB — HEPATITIS C VRS RNA DETECT BY PCR-QUAL: Hepatitis C Vrs RNA by PCR-Qual: POSITIVE — AB

## 2016-05-25 ENCOUNTER — Other Ambulatory Visit: Payer: Self-pay | Admitting: Hematology and Oncology

## 2016-05-25 ENCOUNTER — Ambulatory Visit
Admission: RE | Admit: 2016-05-25 | Discharge: 2016-05-25 | Disposition: A | Discharge status: Home / Self Care | Payer: Medicaid Other | Source: Ambulatory Visit | Attending: Radiation Oncology | Admitting: Radiation Oncology

## 2016-05-25 DIAGNOSIS — Z510 Encounter for antineoplastic radiation therapy: Secondary | ICD-10-CM | POA: Diagnosis not present
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
## 2016-05-26 ENCOUNTER — Ambulatory Visit
Admission: RE | Admit: 2016-05-26 | Discharge: 2016-05-26 | Disposition: A | Discharge status: Home / Self Care | Payer: Medicaid Other | Source: Ambulatory Visit | Attending: Radiation Oncology | Admitting: Radiation Oncology

## 2016-05-26 ENCOUNTER — Encounter: Payer: Self-pay | Admitting: Hematology and Oncology

## 2016-05-26 ENCOUNTER — Other Ambulatory Visit: Payer: Self-pay | Admitting: Hematology and Oncology

## 2016-05-26 ENCOUNTER — Inpatient Hospital Stay: Payer: Medicaid Other

## 2016-05-26 ENCOUNTER — Inpatient Hospital Stay: Payer: Medicaid Other | Attending: Oncology

## 2016-05-26 ENCOUNTER — Inpatient Hospital Stay (HOSPITAL_BASED_OUTPATIENT_CLINIC_OR_DEPARTMENT_OTHER): Payer: Medicaid Other | Admitting: Oncology

## 2016-05-26 VITALS — BP 119/81 | HR 130 | Temp 97.3°F | Resp 18 | Wt 169.7 lb

## 2016-05-26 DIAGNOSIS — E119 Type 2 diabetes mellitus without complications: Secondary | ICD-10-CM | POA: Diagnosis not present

## 2016-05-26 DIAGNOSIS — D701 Agranulocytosis secondary to cancer chemotherapy: Secondary | ICD-10-CM

## 2016-05-26 DIAGNOSIS — D72819 Decreased white blood cell count, unspecified: Secondary | ICD-10-CM | POA: Insufficient documentation

## 2016-05-26 DIAGNOSIS — I4891 Unspecified atrial fibrillation: Secondary | ICD-10-CM

## 2016-05-26 DIAGNOSIS — M25552 Pain in left hip: Secondary | ICD-10-CM

## 2016-05-26 DIAGNOSIS — Z5111 Encounter for antineoplastic chemotherapy: Secondary | ICD-10-CM | POA: Insufficient documentation

## 2016-05-26 DIAGNOSIS — B182 Chronic viral hepatitis C: Secondary | ICD-10-CM | POA: Insufficient documentation

## 2016-05-26 DIAGNOSIS — I2510 Atherosclerotic heart disease of native coronary artery without angina pectoris: Secondary | ICD-10-CM | POA: Diagnosis not present

## 2016-05-26 DIAGNOSIS — I509 Heart failure, unspecified: Secondary | ICD-10-CM | POA: Insufficient documentation

## 2016-05-26 DIAGNOSIS — Z923 Personal history of irradiation: Secondary | ICD-10-CM | POA: Insufficient documentation

## 2016-05-26 DIAGNOSIS — G8929 Other chronic pain: Secondary | ICD-10-CM | POA: Diagnosis not present

## 2016-05-26 DIAGNOSIS — M549 Dorsalgia, unspecified: Secondary | ICD-10-CM | POA: Diagnosis not present

## 2016-05-26 DIAGNOSIS — R531 Weakness: Secondary | ICD-10-CM | POA: Insufficient documentation

## 2016-05-26 DIAGNOSIS — C3431 Malignant neoplasm of lower lobe, right bronchus or lung: Secondary | ICD-10-CM

## 2016-05-26 DIAGNOSIS — R634 Abnormal weight loss: Secondary | ICD-10-CM

## 2016-05-26 DIAGNOSIS — R7989 Other specified abnormal findings of blood chemistry: Secondary | ICD-10-CM | POA: Diagnosis not present

## 2016-05-26 DIAGNOSIS — I110 Hypertensive heart disease with heart failure: Secondary | ICD-10-CM | POA: Diagnosis not present

## 2016-05-26 DIAGNOSIS — R945 Abnormal results of liver function studies: Secondary | ICD-10-CM

## 2016-05-26 DIAGNOSIS — R0602 Shortness of breath: Secondary | ICD-10-CM | POA: Diagnosis not present

## 2016-05-26 DIAGNOSIS — Z79899 Other long term (current) drug therapy: Secondary | ICD-10-CM | POA: Insufficient documentation

## 2016-05-26 DIAGNOSIS — Z9981 Dependence on supplemental oxygen: Secondary | ICD-10-CM | POA: Insufficient documentation

## 2016-05-26 DIAGNOSIS — M545 Low back pain: Secondary | ICD-10-CM | POA: Diagnosis not present

## 2016-05-26 DIAGNOSIS — F17210 Nicotine dependence, cigarettes, uncomplicated: Secondary | ICD-10-CM | POA: Insufficient documentation

## 2016-05-26 DIAGNOSIS — Z66 Do not resuscitate: Secondary | ICD-10-CM | POA: Insufficient documentation

## 2016-05-26 DIAGNOSIS — R630 Anorexia: Secondary | ICD-10-CM | POA: Insufficient documentation

## 2016-05-26 DIAGNOSIS — J449 Chronic obstructive pulmonary disease, unspecified: Secondary | ICD-10-CM

## 2016-05-26 DIAGNOSIS — R61 Generalized hyperhidrosis: Secondary | ICD-10-CM

## 2016-05-26 DIAGNOSIS — F419 Anxiety disorder, unspecified: Secondary | ICD-10-CM

## 2016-05-26 DIAGNOSIS — Z9221 Personal history of antineoplastic chemotherapy: Secondary | ICD-10-CM

## 2016-05-26 DIAGNOSIS — Z510 Encounter for antineoplastic radiation therapy: Secondary | ICD-10-CM | POA: Diagnosis not present

## 2016-05-26 DIAGNOSIS — T451X5A Adverse effect of antineoplastic and immunosuppressive drugs, initial encounter: Secondary | ICD-10-CM

## 2016-05-26 DIAGNOSIS — R5383 Other fatigue: Secondary | ICD-10-CM | POA: Insufficient documentation

## 2016-05-26 DIAGNOSIS — Z7984 Long term (current) use of oral hypoglycemic drugs: Secondary | ICD-10-CM | POA: Insufficient documentation

## 2016-05-26 LAB — CBC WITH DIFFERENTIAL/PLATELET
Basophils Absolute: 0 10*3/uL (ref 0–0.1)
Basophils Relative: 0 %
Eosinophils Absolute: 0 10*3/uL (ref 0–0.7)
Eosinophils Relative: 1 %
HCT: 33.4 % — ABNORMAL LOW (ref 40.0–52.0)
Hemoglobin: 11.5 g/dL — ABNORMAL LOW (ref 13.0–18.0)
Lymphocytes Relative: 15 %
Lymphs Abs: 0.5 10*3/uL — ABNORMAL LOW (ref 1.0–3.6)
MCH: 29.6 pg (ref 26.0–34.0)
MCHC: 34.3 g/dL (ref 32.0–36.0)
MCV: 86.1 fL (ref 80.0–100.0)
Monocytes Absolute: 0.6 10*3/uL (ref 0.2–1.0)
Monocytes Relative: 17 %
Neutro Abs: 2.2 10*3/uL (ref 1.4–6.5)
Neutrophils Relative %: 67 %
Platelets: 148 10*3/uL — ABNORMAL LOW (ref 150–440)
RBC: 3.88 MIL/uL — ABNORMAL LOW (ref 4.40–5.90)
RDW: 14.5 % (ref 11.5–14.5)
WBC: 3.2 10*3/uL — ABNORMAL LOW (ref 3.8–10.6)

## 2016-05-26 LAB — COMPREHENSIVE METABOLIC PANEL
ALT: 97 U/L — ABNORMAL HIGH (ref 17–63)
AST: 68 U/L — ABNORMAL HIGH (ref 15–41)
Albumin: 3.6 g/dL (ref 3.5–5.0)
Alkaline Phosphatase: 47 U/L (ref 38–126)
Anion gap: 6 (ref 5–15)
BUN: 11 mg/dL (ref 6–20)
CO2: 29 mmol/L (ref 22–32)
Calcium: 9.5 mg/dL (ref 8.9–10.3)
Chloride: 102 mmol/L (ref 101–111)
Creatinine, Ser: 0.62 mg/dL (ref 0.61–1.24)
GFR calc Af Amer: >60 mL/min (ref >60)
GFR calc non Af Amer: >60 mL/min (ref >60)
Glucose, Bld: 134 mg/dL — ABNORMAL HIGH (ref 65–99)
Potassium: 4.1 mmol/L (ref 3.5–5.1)
Sodium: 137 mmol/L (ref 135–145)
Total Bilirubin: 0.4 mg/dL (ref 0.3–1.2)
Total Protein: 7.2 g/dL (ref 6.5–8.1)

## 2016-05-26 LAB — MAGNESIUM: Magnesium: 1.7 mg/dL (ref 1.7–2.4)

## 2016-05-26 MED ORDER — SODIUM CHLORIDE 0.9 % IV SOLN
20.0000 mg | Freq: Once | INTRAVENOUS | Status: AC
  Administered 2016-05-26: 20 mg via INTRAVENOUS
  Filled 2016-05-26: qty 2

## 2016-05-26 MED ORDER — CARBOPLATIN CHEMO INJECTION 450 MG/45ML
170.0000 mg | Freq: Once | INTRAVENOUS | Status: AC
  Administered 2016-05-26: 170 mg via INTRAVENOUS
  Filled 2016-05-26: qty 17

## 2016-05-26 MED ORDER — PALONOSETRON HCL INJECTION 0.25 MG/5ML
0.2500 mg | Freq: Once | INTRAVENOUS | Status: AC
  Administered 2016-05-26: 0.25 mg via INTRAVENOUS
  Filled 2016-05-26: qty 5

## 2016-05-26 MED ORDER — SODIUM CHLORIDE 0.9 % IV SOLN
45.0000 mg/m2 | Freq: Once | INTRAVENOUS | Status: AC
  Administered 2016-05-26: 90 mg via INTRAVENOUS
  Filled 2016-05-26: qty 15

## 2016-05-26 MED ORDER — DIPHENHYDRAMINE HCL 50 MG/ML IJ SOLN
50.0000 mg | Freq: Once | INTRAMUSCULAR | Status: AC
Start: 2016-05-26 — End: 2016-05-26
  Administered 2016-05-26: 50 mg via INTRAVENOUS
  Filled 2016-05-26: qty 1

## 2016-05-26 MED ORDER — FAMOTIDINE IN NACL 20-0.9 MG/50ML-% IV SOLN
20.0000 mg | Freq: Once | INTRAVENOUS | Status: AC
  Administered 2016-05-26: 20 mg via INTRAVENOUS
  Filled 2016-05-26: qty 50

## 2016-05-26 MED ORDER — SODIUM CHLORIDE 0.9 % IV SOLN
Freq: Once | INTRAVENOUS | Status: AC
  Administered 2016-05-26: 11:00:00 via INTRAVENOUS
  Filled 2016-05-26: qty 1000

## 2016-05-26 NOTE — Progress Notes (Signed)
Complains of chronic pain in left hip and back, fatigued and decreased energy today

## 2016-05-26 NOTE — Progress Notes (Signed)
Calculated Carbo dose is '280mg'$ . MD at last visit was concerned about decreasing blood counts and wanted to use CrCl of 38m/min for carbo dose calculation to give dose of '170mg'$ . Checked with MD for this visit and he would like to continue current dose of '170mg'$  using CrCl of 627mmin

## 2016-05-27 ENCOUNTER — Ambulatory Visit
Admission: RE | Admit: 2016-05-27 | Discharge: 2016-05-27 | Disposition: A | Discharge status: Home / Self Care | Payer: Medicaid Other | Source: Ambulatory Visit | Attending: Radiation Oncology | Admitting: Radiation Oncology

## 2016-05-27 DIAGNOSIS — Z510 Encounter for antineoplastic radiation therapy: Secondary | ICD-10-CM | POA: Diagnosis not present

## 2016-05-28 ENCOUNTER — Ambulatory Visit
Admission: RE | Admit: 2016-05-28 | Discharge: 2016-05-28 | Disposition: A | Discharge status: Home / Self Care | Payer: Medicaid Other | Source: Ambulatory Visit | Attending: Radiation Oncology | Admitting: Radiation Oncology

## 2016-05-28 DIAGNOSIS — Z510 Encounter for antineoplastic radiation therapy: Secondary | ICD-10-CM | POA: Diagnosis not present

## 2016-05-29 ENCOUNTER — Ambulatory Visit
Admission: RE | Admit: 2016-05-29 | Discharge: 2016-05-29 | Disposition: A | Discharge status: Home / Self Care | Payer: Medicaid Other | Source: Ambulatory Visit | Attending: Radiation Oncology | Admitting: Radiation Oncology

## 2016-05-29 DIAGNOSIS — Z510 Encounter for antineoplastic radiation therapy: Secondary | ICD-10-CM | POA: Diagnosis not present

## 2016-05-29 LAB — HCV RNA QUANT RFLX ULTRA OR GENOTYP
HCV RNA Qnt(log copy/mL): 6.538 log10 IU/mL
HepC Qn: 3450000 IU/mL

## 2016-05-29 LAB — HEPATITIS C GENOTYPE

## 2016-06-01 ENCOUNTER — Ambulatory Visit
Admission: RE | Admit: 2016-06-01 | Discharge: 2016-06-01 | Disposition: A | Discharge status: Home / Self Care | Payer: Medicaid Other | Source: Ambulatory Visit | Attending: Radiation Oncology | Admitting: Radiation Oncology

## 2016-06-01 DIAGNOSIS — Z510 Encounter for antineoplastic radiation therapy: Secondary | ICD-10-CM | POA: Diagnosis not present

## 2016-06-02 ENCOUNTER — Inpatient Hospital Stay: Payer: Medicaid Other

## 2016-06-02 ENCOUNTER — Other Ambulatory Visit: Payer: Self-pay | Admitting: Hematology and Oncology

## 2016-06-02 ENCOUNTER — Encounter: Payer: Self-pay | Admitting: Hematology and Oncology

## 2016-06-02 ENCOUNTER — Ambulatory Visit
Admission: RE | Admit: 2016-06-02 | Discharge: 2016-06-02 | Disposition: A | Discharge status: Home / Self Care | Payer: Medicaid Other | Source: Ambulatory Visit | Attending: Radiation Oncology | Admitting: Radiation Oncology

## 2016-06-02 ENCOUNTER — Inpatient Hospital Stay (HOSPITAL_BASED_OUTPATIENT_CLINIC_OR_DEPARTMENT_OTHER): Payer: Medicaid Other | Admitting: Hematology and Oncology

## 2016-06-02 VITALS — BP 135/85 | HR 129 | Temp 97.0°F | Resp 18 | Wt 171.1 lb

## 2016-06-02 DIAGNOSIS — B182 Chronic viral hepatitis C: Secondary | ICD-10-CM

## 2016-06-02 DIAGNOSIS — F17210 Nicotine dependence, cigarettes, uncomplicated: Secondary | ICD-10-CM

## 2016-06-02 DIAGNOSIS — Z5111 Encounter for antineoplastic chemotherapy: Secondary | ICD-10-CM | POA: Diagnosis not present

## 2016-06-02 DIAGNOSIS — C3431 Malignant neoplasm of lower lobe, right bronchus or lung: Secondary | ICD-10-CM

## 2016-06-02 DIAGNOSIS — Z66 Do not resuscitate: Secondary | ICD-10-CM

## 2016-06-02 DIAGNOSIS — R630 Anorexia: Secondary | ICD-10-CM | POA: Diagnosis not present

## 2016-06-02 DIAGNOSIS — I110 Hypertensive heart disease with heart failure: Secondary | ICD-10-CM

## 2016-06-02 DIAGNOSIS — G8929 Other chronic pain: Secondary | ICD-10-CM | POA: Diagnosis not present

## 2016-06-02 DIAGNOSIS — E119 Type 2 diabetes mellitus without complications: Secondary | ICD-10-CM

## 2016-06-02 DIAGNOSIS — R7989 Other specified abnormal findings of blood chemistry: Secondary | ICD-10-CM | POA: Diagnosis not present

## 2016-06-02 DIAGNOSIS — R5383 Other fatigue: Secondary | ICD-10-CM

## 2016-06-02 DIAGNOSIS — I2510 Atherosclerotic heart disease of native coronary artery without angina pectoris: Secondary | ICD-10-CM

## 2016-06-02 DIAGNOSIS — Z510 Encounter for antineoplastic radiation therapy: Secondary | ICD-10-CM | POA: Diagnosis not present

## 2016-06-02 DIAGNOSIS — J449 Chronic obstructive pulmonary disease, unspecified: Secondary | ICD-10-CM

## 2016-06-02 DIAGNOSIS — Z9981 Dependence on supplemental oxygen: Secondary | ICD-10-CM

## 2016-06-02 DIAGNOSIS — I4891 Unspecified atrial fibrillation: Secondary | ICD-10-CM

## 2016-06-02 DIAGNOSIS — B181 Chronic viral hepatitis B without delta-agent: Secondary | ICD-10-CM

## 2016-06-02 DIAGNOSIS — R531 Weakness: Secondary | ICD-10-CM

## 2016-06-02 DIAGNOSIS — Z7984 Long term (current) use of oral hypoglycemic drugs: Secondary | ICD-10-CM

## 2016-06-02 DIAGNOSIS — F419 Anxiety disorder, unspecified: Secondary | ICD-10-CM

## 2016-06-02 DIAGNOSIS — R0602 Shortness of breath: Secondary | ICD-10-CM | POA: Diagnosis not present

## 2016-06-02 DIAGNOSIS — Z9221 Personal history of antineoplastic chemotherapy: Secondary | ICD-10-CM

## 2016-06-02 DIAGNOSIS — M25552 Pain in left hip: Secondary | ICD-10-CM

## 2016-06-02 DIAGNOSIS — Z923 Personal history of irradiation: Secondary | ICD-10-CM

## 2016-06-02 DIAGNOSIS — I509 Heart failure, unspecified: Secondary | ICD-10-CM

## 2016-06-02 DIAGNOSIS — Z79899 Other long term (current) drug therapy: Secondary | ICD-10-CM

## 2016-06-02 DIAGNOSIS — M549 Dorsalgia, unspecified: Secondary | ICD-10-CM

## 2016-06-02 LAB — CBC WITH DIFFERENTIAL/PLATELET
Basophils Absolute: 0 10*3/uL (ref 0–0.1)
Basophils Relative: 0 %
Eosinophils Absolute: 0.1 10*3/uL (ref 0–0.7)
Eosinophils Relative: 2 %
HCT: 31 % — ABNORMAL LOW (ref 40.0–52.0)
Hemoglobin: 10.7 g/dL — ABNORMAL LOW (ref 13.0–18.0)
Lymphocytes Relative: 13 %
Lymphs Abs: 0.4 10*3/uL — ABNORMAL LOW (ref 1.0–3.6)
MCH: 30 pg (ref 26.0–34.0)
MCHC: 34.6 g/dL (ref 32.0–36.0)
MCV: 86.6 fL (ref 80.0–100.0)
Monocytes Absolute: 0.4 10*3/uL (ref 0.2–1.0)
Monocytes Relative: 11 %
Neutro Abs: 2.5 10*3/uL (ref 1.4–6.5)
Neutrophils Relative %: 74 %
Platelets: 150 10*3/uL (ref 150–440)
RBC: 3.58 MIL/uL — ABNORMAL LOW (ref 4.40–5.90)
RDW: 14.8 % — ABNORMAL HIGH (ref 11.5–14.5)
WBC: 3.4 10*3/uL — ABNORMAL LOW (ref 3.8–10.6)

## 2016-06-02 LAB — COMPREHENSIVE METABOLIC PANEL
ALT: 117 U/L — ABNORMAL HIGH (ref 17–63)
AST: 71 U/L — ABNORMAL HIGH (ref 15–41)
Albumin: 3.6 g/dL (ref 3.5–5.0)
Alkaline Phosphatase: 44 U/L (ref 38–126)
Anion gap: 6 (ref 5–15)
BUN: 10 mg/dL (ref 6–20)
CO2: 28 mmol/L (ref 22–32)
Calcium: 9.1 mg/dL (ref 8.9–10.3)
Chloride: 102 mmol/L (ref 101–111)
Creatinine, Ser: 0.57 mg/dL — ABNORMAL LOW (ref 0.61–1.24)
GFR calc Af Amer: >60 mL/min (ref >60)
GFR calc non Af Amer: >60 mL/min (ref >60)
Glucose, Bld: 144 mg/dL — ABNORMAL HIGH (ref 65–99)
Potassium: 4 mmol/L (ref 3.5–5.1)
Sodium: 136 mmol/L (ref 135–145)
Total Bilirubin: 0.3 mg/dL (ref 0.3–1.2)
Total Protein: 7.1 g/dL (ref 6.5–8.1)

## 2016-06-02 LAB — MAGNESIUM: Magnesium: 1.7 mg/dL (ref 1.7–2.4)

## 2016-06-02 MED ORDER — SODIUM CHLORIDE 0.9 % IV SOLN
20.0000 mg | Freq: Once | INTRAVENOUS | Status: AC
  Administered 2016-06-02: 20 mg via INTRAVENOUS
  Filled 2016-06-02: qty 2

## 2016-06-02 MED ORDER — SODIUM CHLORIDE 0.9 % IV SOLN
Freq: Once | INTRAVENOUS | Status: AC
  Administered 2016-06-02: 10:00:00 via INTRAVENOUS
  Filled 2016-06-02: qty 1000

## 2016-06-02 MED ORDER — SODIUM CHLORIDE 0.9 % IV SOLN
45.0000 mg/m2 | Freq: Once | INTRAVENOUS | Status: AC
  Administered 2016-06-02: 90 mg via INTRAVENOUS
  Filled 2016-06-02: qty 15

## 2016-06-02 MED ORDER — FAMOTIDINE IN NACL 20-0.9 MG/50ML-% IV SOLN
20.0000 mg | Freq: Once | INTRAVENOUS | Status: AC
  Administered 2016-06-02: 20 mg via INTRAVENOUS
  Filled 2016-06-02: qty 50

## 2016-06-02 MED ORDER — PALONOSETRON HCL INJECTION 0.25 MG/5ML
0.2500 mg | Freq: Once | INTRAVENOUS | Status: AC
  Administered 2016-06-02: 0.25 mg via INTRAVENOUS
  Filled 2016-06-02: qty 5

## 2016-06-02 MED ORDER — SODIUM CHLORIDE 0.9 % IV SOLN
170.0000 mg | Freq: Once | INTRAVENOUS | Status: AC
  Administered 2016-06-02: 170 mg via INTRAVENOUS
  Filled 2016-06-02: qty 17

## 2016-06-02 MED ORDER — DIPHENHYDRAMINE HCL 50 MG/ML IJ SOLN
50.0000 mg | Freq: Once | INTRAMUSCULAR | Status: AC
  Administered 2016-06-02: 50 mg via INTRAVENOUS
  Filled 2016-06-02: qty 1
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
## 2016-06-02 NOTE — Progress Notes (Signed)
ALT 117 today. Per Dr Mike Gip proceed with treatment

## 2016-06-03 ENCOUNTER — Ambulatory Visit
Admission: RE | Admit: 2016-06-03 | Discharge: 2016-06-03 | Disposition: A | Discharge status: Home / Self Care | Payer: Medicaid Other | Source: Ambulatory Visit | Attending: Radiation Oncology | Admitting: Radiation Oncology

## 2016-06-03 DIAGNOSIS — Z510 Encounter for antineoplastic radiation therapy: Secondary | ICD-10-CM | POA: Diagnosis not present

## 2016-06-04 ENCOUNTER — Ambulatory Visit
Admission: RE | Admit: 2016-06-04 | Discharge: 2016-06-04 | Disposition: A | Discharge status: Home / Self Care | Payer: Medicaid Other | Source: Ambulatory Visit | Attending: Radiation Oncology | Admitting: Radiation Oncology

## 2016-06-04 DIAGNOSIS — Z510 Encounter for antineoplastic radiation therapy: Secondary | ICD-10-CM | POA: Diagnosis not present

## 2016-06-05 ENCOUNTER — Ambulatory Visit
Admission: RE | Admit: 2016-06-05 | Discharge: 2016-06-05 | Disposition: A | Discharge status: Home / Self Care | Payer: Medicaid Other | Source: Ambulatory Visit | Attending: Radiation Oncology | Admitting: Radiation Oncology

## 2016-06-05 DIAGNOSIS — Z510 Encounter for antineoplastic radiation therapy: Secondary | ICD-10-CM | POA: Diagnosis not present

## 2016-06-08 ENCOUNTER — Ambulatory Visit
Admission: RE | Admit: 2016-06-08 | Discharge: 2016-06-08 | Disposition: A | Discharge status: Home / Self Care | Payer: Medicaid Other | Source: Ambulatory Visit | Attending: Radiation Oncology | Admitting: Radiation Oncology

## 2016-06-08 DIAGNOSIS — Z510 Encounter for antineoplastic radiation therapy: Secondary | ICD-10-CM | POA: Diagnosis not present

## 2016-06-09 ENCOUNTER — Other Ambulatory Visit: Payer: Self-pay | Admitting: Hematology and Oncology

## 2016-06-09 ENCOUNTER — Inpatient Hospital Stay: Payer: Medicaid Other

## 2016-06-09 ENCOUNTER — Inpatient Hospital Stay (HOSPITAL_BASED_OUTPATIENT_CLINIC_OR_DEPARTMENT_OTHER): Payer: Medicaid Other | Admitting: Hematology and Oncology

## 2016-06-09 ENCOUNTER — Telehealth: Payer: Self-pay | Admitting: *Deleted

## 2016-06-09 ENCOUNTER — Ambulatory Visit
Admission: RE | Admit: 2016-06-09 | Discharge: 2016-06-09 | Disposition: A | Discharge status: Home / Self Care | Payer: Medicaid Other | Source: Ambulatory Visit | Attending: Radiation Oncology | Admitting: Radiation Oncology

## 2016-06-09 ENCOUNTER — Encounter: Payer: Self-pay | Admitting: Hematology and Oncology

## 2016-06-09 VITALS — BP 116/80 | HR 142 | Temp 97.7°F | Resp 18 | Wt 171.4 lb

## 2016-06-09 DIAGNOSIS — C3431 Malignant neoplasm of lower lobe, right bronchus or lung: Secondary | ICD-10-CM

## 2016-06-09 DIAGNOSIS — Z5111 Encounter for antineoplastic chemotherapy: Secondary | ICD-10-CM | POA: Diagnosis not present

## 2016-06-09 DIAGNOSIS — F17210 Nicotine dependence, cigarettes, uncomplicated: Secondary | ICD-10-CM | POA: Diagnosis not present

## 2016-06-09 DIAGNOSIS — R630 Anorexia: Secondary | ICD-10-CM | POA: Diagnosis not present

## 2016-06-09 DIAGNOSIS — E119 Type 2 diabetes mellitus without complications: Secondary | ICD-10-CM

## 2016-06-09 DIAGNOSIS — R7989 Other specified abnormal findings of blood chemistry: Secondary | ICD-10-CM

## 2016-06-09 DIAGNOSIS — M25552 Pain in left hip: Secondary | ICD-10-CM

## 2016-06-09 DIAGNOSIS — D72819 Decreased white blood cell count, unspecified: Secondary | ICD-10-CM | POA: Diagnosis not present

## 2016-06-09 DIAGNOSIS — Z9221 Personal history of antineoplastic chemotherapy: Secondary | ICD-10-CM

## 2016-06-09 DIAGNOSIS — Z7984 Long term (current) use of oral hypoglycemic drugs: Secondary | ICD-10-CM

## 2016-06-09 DIAGNOSIS — R634 Abnormal weight loss: Secondary | ICD-10-CM

## 2016-06-09 DIAGNOSIS — Z66 Do not resuscitate: Secondary | ICD-10-CM

## 2016-06-09 DIAGNOSIS — Z510 Encounter for antineoplastic radiation therapy: Secondary | ICD-10-CM | POA: Diagnosis not present

## 2016-06-09 DIAGNOSIS — B182 Chronic viral hepatitis C: Secondary | ICD-10-CM

## 2016-06-09 DIAGNOSIS — R5383 Other fatigue: Secondary | ICD-10-CM | POA: Diagnosis not present

## 2016-06-09 DIAGNOSIS — R531 Weakness: Secondary | ICD-10-CM | POA: Diagnosis not present

## 2016-06-09 DIAGNOSIS — I4891 Unspecified atrial fibrillation: Secondary | ICD-10-CM

## 2016-06-09 DIAGNOSIS — M545 Low back pain: Secondary | ICD-10-CM

## 2016-06-09 DIAGNOSIS — G8929 Other chronic pain: Secondary | ICD-10-CM

## 2016-06-09 DIAGNOSIS — Z7189 Other specified counseling: Secondary | ICD-10-CM | POA: Insufficient documentation

## 2016-06-09 DIAGNOSIS — I110 Hypertensive heart disease with heart failure: Secondary | ICD-10-CM

## 2016-06-09 DIAGNOSIS — Z923 Personal history of irradiation: Secondary | ICD-10-CM

## 2016-06-09 DIAGNOSIS — J449 Chronic obstructive pulmonary disease, unspecified: Secondary | ICD-10-CM

## 2016-06-09 DIAGNOSIS — I509 Heart failure, unspecified: Secondary | ICD-10-CM

## 2016-06-09 DIAGNOSIS — I2510 Atherosclerotic heart disease of native coronary artery without angina pectoris: Secondary | ICD-10-CM

## 2016-06-09 DIAGNOSIS — Z79899 Other long term (current) drug therapy: Secondary | ICD-10-CM

## 2016-06-09 DIAGNOSIS — R0602 Shortness of breath: Secondary | ICD-10-CM

## 2016-06-09 DIAGNOSIS — Z9981 Dependence on supplemental oxygen: Secondary | ICD-10-CM

## 2016-06-09 DIAGNOSIS — F419 Anxiety disorder, unspecified: Secondary | ICD-10-CM

## 2016-06-09 LAB — COMPREHENSIVE METABOLIC PANEL
ALT: 107 U/L — ABNORMAL HIGH (ref 17–63)
AST: 55 U/L — ABNORMAL HIGH (ref 15–41)
Albumin: 3.5 g/dL (ref 3.5–5.0)
Alkaline Phosphatase: 51 U/L (ref 38–126)
Anion gap: 5 (ref 5–15)
BUN: 10 mg/dL (ref 6–20)
CO2: 31 mmol/L (ref 22–32)
Calcium: 9.1 mg/dL (ref 8.9–10.3)
Chloride: 102 mmol/L (ref 101–111)
Creatinine, Ser: 0.51 mg/dL — ABNORMAL LOW (ref 0.61–1.24)
GFR calc Af Amer: >60 mL/min (ref >60)
GFR calc non Af Amer: >60 mL/min (ref >60)
Glucose, Bld: 196 mg/dL — ABNORMAL HIGH (ref 65–99)
Potassium: 3.8 mmol/L (ref 3.5–5.1)
Sodium: 138 mmol/L (ref 135–145)
Total Bilirubin: 0.2 mg/dL — ABNORMAL LOW (ref 0.3–1.2)
Total Protein: 7 g/dL (ref 6.5–8.1)

## 2016-06-09 LAB — CBC WITH DIFFERENTIAL/PLATELET
Basophils Absolute: 0 10*3/uL (ref 0–0.1)
Basophils Relative: 0 %
Eosinophils Absolute: 0 10*3/uL (ref 0–0.7)
Eosinophils Relative: 1 %
HCT: 30.8 % — ABNORMAL LOW (ref 40.0–52.0)
Hemoglobin: 10.7 g/dL — ABNORMAL LOW (ref 13.0–18.0)
Lymphocytes Relative: 12 %
Lymphs Abs: 0.3 10*3/uL — ABNORMAL LOW (ref 1.0–3.6)
MCH: 30.6 pg (ref 26.0–34.0)
MCHC: 34.8 g/dL (ref 32.0–36.0)
MCV: 87.9 fL (ref 80.0–100.0)
Monocytes Absolute: 0.3 10*3/uL (ref 0.2–1.0)
Monocytes Relative: 9 %
Neutro Abs: 2.2 10*3/uL (ref 1.4–6.5)
Neutrophils Relative %: 78 %
Platelets: 144 10*3/uL — ABNORMAL LOW (ref 150–440)
RBC: 3.5 MIL/uL — ABNORMAL LOW (ref 4.40–5.90)
RDW: 15.7 % — ABNORMAL HIGH (ref 11.5–14.5)
WBC: 2.8 10*3/uL — ABNORMAL LOW (ref 3.8–10.6)

## 2016-06-09 LAB — MAGNESIUM: Magnesium: 1.6 mg/dL — ABNORMAL LOW (ref 1.7–2.4)

## 2016-06-09 LAB — FERRITIN: Ferritin: 181 ng/mL (ref 24–336)

## 2016-06-09 MED ORDER — DIPHENHYDRAMINE HCL 50 MG/ML IJ SOLN
50.0000 mg | Freq: Once | INTRAMUSCULAR | Status: AC
  Administered 2016-06-09: 50 mg via INTRAVENOUS
  Filled 2016-06-09: qty 1

## 2016-06-09 MED ORDER — PALONOSETRON HCL INJECTION 0.25 MG/5ML
0.2500 mg | Freq: Once | INTRAVENOUS | Status: AC
  Administered 2016-06-09: 0.25 mg via INTRAVENOUS
  Filled 2016-06-09: qty 5

## 2016-06-09 MED ORDER — FAMOTIDINE IN NACL 20-0.9 MG/50ML-% IV SOLN
20.0000 mg | Freq: Once | INTRAVENOUS | Status: AC
  Administered 2016-06-09: 20 mg via INTRAVENOUS
  Filled 2016-06-09: qty 50

## 2016-06-09 MED ORDER — SODIUM CHLORIDE 0.9 % IV SOLN
170.0000 mg | Freq: Once | INTRAVENOUS | Status: AC
  Administered 2016-06-09: 170 mg via INTRAVENOUS
  Filled 2016-06-09: qty 17

## 2016-06-09 MED ORDER — SODIUM CHLORIDE 0.9 % IV SOLN
1.0000 g | Freq: Once | INTRAVENOUS | Status: AC
  Administered 2016-06-09: 1 g via INTRAVENOUS
  Filled 2016-06-09: qty 2

## 2016-06-09 MED ORDER — MEGESTROL ACETATE 40 MG/ML PO SUSP: ORAL | 0 refills | Status: AC

## 2016-06-09 MED ORDER — PACLITAXEL CHEMO INJECTION 300 MG/50ML
45.0000 mg/m2 | Freq: Once | INTRAVENOUS | Status: AC
  Administered 2016-06-09: 90 mg via INTRAVENOUS
  Filled 2016-06-09: qty 15

## 2016-06-09 MED ORDER — SODIUM CHLORIDE 0.9 % IV SOLN
Freq: Once | INTRAVENOUS | Status: AC
Start: 2016-06-09 — End: 2016-06-09
  Administered 2016-06-09: 10:00:00 via INTRAVENOUS
  Filled 2016-06-09: qty 1000

## 2016-06-09 MED ORDER — SODIUM CHLORIDE 0.9 % IV SOLN
20.0000 mg | Freq: Once | INTRAVENOUS | Status: AC
  Administered 2016-06-09: 20 mg via INTRAVENOUS
  Filled 2016-06-09: qty 2
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
## 2016-06-09 NOTE — Telephone Encounter (Signed)
Megace prescription call to pharmacy

## 2016-06-09 NOTE — Progress Notes (Signed)
Patient offers no complaints today. 

## 2016-06-10 ENCOUNTER — Ambulatory Visit
Admission: RE | Admit: 2016-06-10 | Discharge: 2016-06-10 | Disposition: A | Discharge status: Home / Self Care | Payer: Medicaid Other | Source: Ambulatory Visit | Attending: Radiation Oncology | Admitting: Radiation Oncology

## 2016-06-10 DIAGNOSIS — Z510 Encounter for antineoplastic radiation therapy: Secondary | ICD-10-CM | POA: Diagnosis not present

## 2016-06-11 ENCOUNTER — Ambulatory Visit
Admission: RE | Admit: 2016-06-11 | Discharge: 2016-06-11 | Disposition: A | Discharge status: Home / Self Care | Payer: Medicaid Other | Source: Ambulatory Visit | Attending: Radiation Oncology | Admitting: Radiation Oncology

## 2016-06-11 ENCOUNTER — Ambulatory Visit: Payer: Medicaid Other | Admitting: Hematology and Oncology

## 2016-06-11 DIAGNOSIS — Z510 Encounter for antineoplastic radiation therapy: Secondary | ICD-10-CM | POA: Diagnosis not present

## 2016-06-16 ENCOUNTER — Inpatient Hospital Stay: Payer: Medicaid Other

## 2016-06-16 DIAGNOSIS — B182 Chronic viral hepatitis C: Secondary | ICD-10-CM

## 2016-06-16 DIAGNOSIS — R634 Abnormal weight loss: Secondary | ICD-10-CM

## 2016-06-16 DIAGNOSIS — Z5111 Encounter for antineoplastic chemotherapy: Secondary | ICD-10-CM

## 2016-06-16 DIAGNOSIS — Z7189 Other specified counseling: Secondary | ICD-10-CM

## 2016-06-16 DIAGNOSIS — C3431 Malignant neoplasm of lower lobe, right bronchus or lung: Secondary | ICD-10-CM

## 2016-06-16 LAB — CBC WITH DIFFERENTIAL/PLATELET
Basophils Absolute: 0 K/uL (ref 0–0.1)
Basophils Relative: 0 %
Eosinophils Absolute: 0 K/uL (ref 0–0.7)
Eosinophils Relative: 1 %
HCT: 30.1 % — ABNORMAL LOW (ref 40.0–52.0)
Hemoglobin: 10.6 g/dL — ABNORMAL LOW (ref 13.0–18.0)
Lymphocytes Relative: 16 %
Lymphs Abs: 0.3 K/uL — ABNORMAL LOW (ref 1.0–3.6)
MCH: 30.8 pg (ref 26.0–34.0)
MCHC: 35.1 g/dL (ref 32.0–36.0)
MCV: 87.7 fL (ref 80.0–100.0)
Monocytes Absolute: 0.3 K/uL (ref 0.2–1.0)
Monocytes Relative: 14 %
Neutro Abs: 1.3 K/uL — ABNORMAL LOW (ref 1.4–6.5)
Neutrophils Relative %: 69 %
Platelets: 169 K/uL (ref 150–440)
RBC: 3.44 MIL/uL — ABNORMAL LOW (ref 4.40–5.90)
RDW: 16.1 % — ABNORMAL HIGH (ref 11.5–14.5)
WBC: 2 K/uL — ABNORMAL LOW (ref 3.8–10.6)

## 2016-06-16 LAB — BASIC METABOLIC PANEL
Anion gap: 8 (ref 5–15)
BUN: 9 mg/dL (ref 6–20)
CO2: 31 mmol/L (ref 22–32)
Calcium: 8.9 mg/dL (ref 8.9–10.3)
Chloride: 98 mmol/L — ABNORMAL LOW (ref 101–111)
Creatinine, Ser: 0.57 mg/dL — ABNORMAL LOW (ref 0.61–1.24)
GFR calc Af Amer: >60 mL/min (ref >60)
GFR calc non Af Amer: >60 mL/min (ref >60)
Glucose, Bld: 189 mg/dL — ABNORMAL HIGH (ref 65–99)
Potassium: 3.6 mmol/L (ref 3.5–5.1)
Sodium: 137 mmol/L (ref 135–145)

## 2016-06-16 LAB — MAGNESIUM: Magnesium: 1.6 mg/dL — ABNORMAL LOW (ref 1.7–2.4)

## 2016-06-16 MED ORDER — SODIUM CHLORIDE 0.9 % IV SOLN
1.0000 g | Freq: Once | INTRAVENOUS | Status: AC
  Administered 2016-06-16: 1 g via INTRAVENOUS
  Filled 2016-06-16: qty 2

## 2016-06-16 MED ORDER — SODIUM CHLORIDE 0.9 % IV SOLN
Freq: Once | INTRAVENOUS | Status: AC
  Administered 2016-06-16: 10:00:00 via INTRAVENOUS
  Filled 2016-06-16: qty 1000

## 2016-06-22 ENCOUNTER — Ambulatory Visit (INDEPENDENT_AMBULATORY_CARE_PROVIDER_SITE_OTHER): Payer: Medicaid Other | Admitting: Gastroenterology

## 2016-06-22 ENCOUNTER — Encounter: Payer: Self-pay | Admitting: Gastroenterology

## 2016-06-22 VITALS — BP 119/74 | HR 121 | Temp 98.3°F | Resp 18 | Ht 68.0 in | Wt 168.0 lb

## 2016-06-22 DIAGNOSIS — B182 Chronic viral hepatitis C: Secondary | ICD-10-CM | POA: Diagnosis not present
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
## 2016-06-22 NOTE — Patient Instructions (Addendum)
You are scheduled for a RUQ abdominal US with tissue elastography at [facility] on Friday, May 4th at 10:30am. Please arrive at the medical mall at 10:15am. You cannot have anything to eat or drink after midnight Thursday.    If you need to rescheduled this scan for any reason, Please contact central scheduling at [phone].

## 2016-06-23 ENCOUNTER — Inpatient Hospital Stay (HOSPITAL_BASED_OUTPATIENT_CLINIC_OR_DEPARTMENT_OTHER): Payer: Medicaid Other | Admitting: Hematology and Oncology

## 2016-06-23 ENCOUNTER — Inpatient Hospital Stay: Payer: Medicaid Other

## 2016-06-23 ENCOUNTER — Other Ambulatory Visit: Payer: Self-pay | Admitting: Hematology and Oncology

## 2016-06-23 ENCOUNTER — Ambulatory Visit
Admission: RE | Admit: 2016-06-23 | Discharge: 2016-06-23 | Disposition: A | Discharge status: Home / Self Care | Payer: Medicaid Other | Source: Ambulatory Visit | Attending: Hematology and Oncology | Admitting: Hematology and Oncology

## 2016-06-23 ENCOUNTER — Inpatient Hospital Stay: Payer: Medicaid Other | Attending: Hematology and Oncology

## 2016-06-23 ENCOUNTER — Encounter: Payer: Self-pay | Admitting: Hematology and Oncology

## 2016-06-23 VITALS — BP 114/77 | HR 139 | Temp 98.2°F | Resp 18 | Wt 170.6 lb

## 2016-06-23 DIAGNOSIS — Z5111 Encounter for antineoplastic chemotherapy: Secondary | ICD-10-CM

## 2016-06-23 DIAGNOSIS — B182 Chronic viral hepatitis C: Secondary | ICD-10-CM | POA: Diagnosis not present

## 2016-06-23 DIAGNOSIS — D72819 Decreased white blood cell count, unspecified: Secondary | ICD-10-CM | POA: Insufficient documentation

## 2016-06-23 DIAGNOSIS — R0602 Shortness of breath: Secondary | ICD-10-CM | POA: Insufficient documentation

## 2016-06-23 DIAGNOSIS — I4891 Unspecified atrial fibrillation: Secondary | ICD-10-CM

## 2016-06-23 DIAGNOSIS — E119 Type 2 diabetes mellitus without complications: Secondary | ICD-10-CM | POA: Insufficient documentation

## 2016-06-23 DIAGNOSIS — R630 Anorexia: Secondary | ICD-10-CM

## 2016-06-23 DIAGNOSIS — G8929 Other chronic pain: Secondary | ICD-10-CM

## 2016-06-23 DIAGNOSIS — R05 Cough: Secondary | ICD-10-CM

## 2016-06-23 DIAGNOSIS — I110 Hypertensive heart disease with heart failure: Secondary | ICD-10-CM | POA: Diagnosis not present

## 2016-06-23 DIAGNOSIS — C3431 Malignant neoplasm of lower lobe, right bronchus or lung: Secondary | ICD-10-CM | POA: Diagnosis not present

## 2016-06-23 DIAGNOSIS — M25552 Pain in left hip: Secondary | ICD-10-CM | POA: Insufficient documentation

## 2016-06-23 DIAGNOSIS — Z7984 Long term (current) use of oral hypoglycemic drugs: Secondary | ICD-10-CM

## 2016-06-23 DIAGNOSIS — G4700 Insomnia, unspecified: Secondary | ICD-10-CM | POA: Diagnosis not present

## 2016-06-23 DIAGNOSIS — Z923 Personal history of irradiation: Secondary | ICD-10-CM | POA: Insufficient documentation

## 2016-06-23 DIAGNOSIS — M545 Low back pain: Secondary | ICD-10-CM | POA: Diagnosis not present

## 2016-06-23 DIAGNOSIS — Z66 Do not resuscitate: Secondary | ICD-10-CM

## 2016-06-23 DIAGNOSIS — R5383 Other fatigue: Secondary | ICD-10-CM | POA: Insufficient documentation

## 2016-06-23 DIAGNOSIS — R634 Abnormal weight loss: Secondary | ICD-10-CM | POA: Diagnosis not present

## 2016-06-23 DIAGNOSIS — Z9981 Dependence on supplemental oxygen: Secondary | ICD-10-CM | POA: Diagnosis not present

## 2016-06-23 DIAGNOSIS — J449 Chronic obstructive pulmonary disease, unspecified: Secondary | ICD-10-CM | POA: Diagnosis not present

## 2016-06-23 DIAGNOSIS — I2510 Atherosclerotic heart disease of native coronary artery without angina pectoris: Secondary | ICD-10-CM | POA: Insufficient documentation

## 2016-06-23 DIAGNOSIS — E876 Hypokalemia: Secondary | ICD-10-CM

## 2016-06-23 DIAGNOSIS — F17210 Nicotine dependence, cigarettes, uncomplicated: Secondary | ICD-10-CM

## 2016-06-23 DIAGNOSIS — Z79899 Other long term (current) drug therapy: Secondary | ICD-10-CM | POA: Insufficient documentation

## 2016-06-23 DIAGNOSIS — Z7189 Other specified counseling: Secondary | ICD-10-CM

## 2016-06-23 DIAGNOSIS — I509 Heart failure, unspecified: Secondary | ICD-10-CM | POA: Insufficient documentation

## 2016-06-23 DIAGNOSIS — R7989 Other specified abnormal findings of blood chemistry: Secondary | ICD-10-CM | POA: Diagnosis not present

## 2016-06-23 DIAGNOSIS — Z9221 Personal history of antineoplastic chemotherapy: Secondary | ICD-10-CM | POA: Insufficient documentation

## 2016-06-23 DIAGNOSIS — R042 Hemoptysis: Secondary | ICD-10-CM | POA: Insufficient documentation

## 2016-06-23 DIAGNOSIS — R945 Abnormal results of liver function studies: Secondary | ICD-10-CM

## 2016-06-23 LAB — COMPREHENSIVE METABOLIC PANEL
ALT: 101 U/L — ABNORMAL HIGH (ref 17–63)
AST: 68 U/L — ABNORMAL HIGH (ref 15–41)
Albumin: 3.7 g/dL (ref 3.5–5.0)
Alkaline Phosphatase: 52 U/L (ref 38–126)
Anion gap: 8 (ref 5–15)
BUN: 12 mg/dL (ref 6–20)
CO2: 34 mmol/L — ABNORMAL HIGH (ref 22–32)
Calcium: 9.5 mg/dL (ref 8.9–10.3)
Chloride: 95 mmol/L — ABNORMAL LOW (ref 101–111)
Creatinine, Ser: 0.62 mg/dL (ref 0.61–1.24)
GFR calc Af Amer: >60 mL/min (ref >60)
GFR calc non Af Amer: >60 mL/min (ref >60)
Glucose, Bld: 172 mg/dL — ABNORMAL HIGH (ref 65–99)
Potassium: 3.4 mmol/L — ABNORMAL LOW (ref 3.5–5.1)
Sodium: 137 mmol/L (ref 135–145)
Total Bilirubin: 0.3 mg/dL (ref 0.3–1.2)
Total Protein: 7.2 g/dL (ref 6.5–8.1)

## 2016-06-23 LAB — CBC WITH DIFFERENTIAL/PLATELET
Basophils Absolute: 0 10*3/uL (ref 0–0.1)
Basophils Relative: 0 %
Eosinophils Absolute: 0 10*3/uL (ref 0–0.7)
Eosinophils Relative: 0 %
HCT: 33.9 % — ABNORMAL LOW (ref 40.0–52.0)
Hemoglobin: 11.9 g/dL — ABNORMAL LOW (ref 13.0–18.0)
Lymphocytes Relative: 15 %
Lymphs Abs: 0.5 10*3/uL — ABNORMAL LOW (ref 1.0–3.6)
MCH: 31.2 pg (ref 26.0–34.0)
MCHC: 35 g/dL (ref 32.0–36.0)
MCV: 89.1 fL (ref 80.0–100.0)
Monocytes Absolute: 0.6 10*3/uL (ref 0.2–1.0)
Monocytes Relative: 17 %
Neutro Abs: 2.2 10*3/uL (ref 1.4–6.5)
Neutrophils Relative %: 68 %
Platelets: 182 10*3/uL (ref 150–440)
RBC: 3.81 MIL/uL — ABNORMAL LOW (ref 4.40–5.90)
RDW: 18.4 % — ABNORMAL HIGH (ref 11.5–14.5)
WBC: 3.3 10*3/uL — ABNORMAL LOW (ref 3.8–10.6)

## 2016-06-23 LAB — CERULOPLASMIN: Ceruloplasmin: 29.6 mg/dL (ref 16.0–31.0)

## 2016-06-23 LAB — HEPATITIS A ANTIBODY, TOTAL: Hep A Total Ab: NEGATIVE

## 2016-06-23 LAB — MITOCHONDRIAL ANTIBODIES: MITOCHONDRIAL AB: 6.3 U (ref 0.0–20.0)

## 2016-06-23 LAB — PROTIME-INR
INR: 1 (ref 0.8–1.2)
Prothrombin Time: 10.2 s (ref 9.1–12.0)

## 2016-06-23 LAB — MAGNESIUM: Magnesium: 1.7 mg/dL (ref 1.7–2.4)

## 2016-06-23 LAB — ANA: ANA: NEGATIVE

## 2016-06-23 LAB — ANTI-SMOOTH MUSCLE ANTIBODY, IGG: SMOOTH MUSCLE AB: 11 U (ref 0–19)

## 2016-06-23 LAB — ALPHA-1-ANTITRYPSIN: A-1 Antitrypsin: 171 mg/dL (ref 90–200)
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
## 2016-06-23 NOTE — Progress Notes (Signed)
No treatment today per Dr. Corcoran. LJ 

## 2016-06-23 NOTE — Progress Notes (Signed)
Patient complains of pain in hips, back and chest.  On 5L O2.

## 2016-06-24 ENCOUNTER — Telehealth: Payer: Self-pay | Admitting: *Deleted

## 2016-06-24 NOTE — Telephone Encounter (Signed)
Called Nursing Home verified with the nursing staff that he was on kcl bid as ordered.

## 2016-06-26 ENCOUNTER — Ambulatory Visit
Admission: RE | Admit: 2016-06-26 | Discharge: 2016-06-26 | Disposition: A | Discharge status: Home / Self Care | Payer: Medicaid Other | Source: Ambulatory Visit | Attending: Gastroenterology | Admitting: Gastroenterology

## 2016-06-26 DIAGNOSIS — B182 Chronic viral hepatitis C: Secondary | ICD-10-CM

## 2016-06-30 ENCOUNTER — Inpatient Hospital Stay: Payer: Medicaid Other

## 2016-06-30 DIAGNOSIS — R7989 Other specified abnormal findings of blood chemistry: Secondary | ICD-10-CM

## 2016-06-30 DIAGNOSIS — C3431 Malignant neoplasm of lower lobe, right bronchus or lung: Secondary | ICD-10-CM

## 2016-06-30 DIAGNOSIS — R945 Abnormal results of liver function studies: Secondary | ICD-10-CM

## 2016-06-30 DIAGNOSIS — E876 Hypokalemia: Secondary | ICD-10-CM

## 2016-06-30 DIAGNOSIS — R042 Hemoptysis: Secondary | ICD-10-CM

## 2016-06-30 LAB — BASIC METABOLIC PANEL
Anion gap: 6 (ref 5–15)
BUN: 10 mg/dL (ref 6–20)
CO2: 32 mmol/L (ref 22–32)
Calcium: 9.2 mg/dL (ref 8.9–10.3)
Chloride: 99 mmol/L — ABNORMAL LOW (ref 101–111)
Creatinine, Ser: 0.66 mg/dL (ref 0.61–1.24)
GFR calc Af Amer: >60 mL/min (ref >60)
GFR calc non Af Amer: >60 mL/min (ref >60)
Glucose, Bld: 159 mg/dL — ABNORMAL HIGH (ref 65–99)
Potassium: 3.7 mmol/L (ref 3.5–5.1)
Sodium: 137 mmol/L (ref 135–145)

## 2016-06-30 LAB — MAGNESIUM: Magnesium: 1.8 mg/dL (ref 1.7–2.4)

## 2016-07-01 ENCOUNTER — Encounter: Payer: Self-pay | Admitting: Hematology and Oncology

## 2016-07-01 ENCOUNTER — Telehealth: Payer: Self-pay | Admitting: *Deleted

## 2016-07-01 NOTE — Telephone Encounter (Signed)
Left message for patient's mother that I was returning her call for questions, left message for her to call back.

## 2016-07-03 ENCOUNTER — Ambulatory Visit
Admission: RE | Admit: 2016-07-03 | Discharge: 2016-07-03 | Disposition: A | Discharge status: Home / Self Care | Payer: Medicaid Other | Source: Ambulatory Visit | Attending: Gastroenterology | Admitting: Gastroenterology

## 2016-07-03 DIAGNOSIS — B182 Chronic viral hepatitis C: Secondary | ICD-10-CM | POA: Diagnosis not present

## 2016-07-14 ENCOUNTER — Telehealth: Payer: Self-pay

## 2016-07-14 NOTE — Telephone Encounter (Signed)
Left vm for pt's caretaker to return my call.

## 2016-07-14 NOTE — Telephone Encounter (Signed)
-----   Message from Lucilla Lame, MD sent at 07/06/2016  8:32 AM EDT ----- Let the patient know the fibrosis score was F2-F3.

## 2016-07-15 ENCOUNTER — Encounter: Payer: Self-pay | Admitting: Radiation Oncology

## 2016-07-15 ENCOUNTER — Other Ambulatory Visit: Payer: Self-pay | Admitting: *Deleted

## 2016-07-15 ENCOUNTER — Telehealth: Payer: Self-pay | Admitting: *Deleted

## 2016-07-15 ENCOUNTER — Other Ambulatory Visit: Payer: Self-pay | Admitting: Hematology and Oncology

## 2016-07-15 ENCOUNTER — Ambulatory Visit
Admission: RE | Admit: 2016-07-15 | Discharge: 2016-07-15 | Disposition: A | Discharge status: Home / Self Care | Payer: Medicaid Other | Source: Ambulatory Visit | Attending: Radiation Oncology | Admitting: Radiation Oncology

## 2016-07-15 VITALS — BP 143/83 | HR 129 | Temp 97.1°F | Resp 20 | Wt 170.0 lb

## 2016-07-15 DIAGNOSIS — B1920 Unspecified viral hepatitis C without hepatic coma: Secondary | ICD-10-CM | POA: Diagnosis not present

## 2016-07-15 DIAGNOSIS — Z9221 Personal history of antineoplastic chemotherapy: Secondary | ICD-10-CM | POA: Insufficient documentation

## 2016-07-15 DIAGNOSIS — Z85118 Personal history of other malignant neoplasm of bronchus and lung: Secondary | ICD-10-CM | POA: Insufficient documentation

## 2016-07-15 DIAGNOSIS — Z923 Personal history of irradiation: Secondary | ICD-10-CM | POA: Insufficient documentation

## 2016-07-15 DIAGNOSIS — C3431 Malignant neoplasm of lower lobe, right bronchus or lung: Secondary | ICD-10-CM

## 2016-07-15 DIAGNOSIS — F17210 Nicotine dependence, cigarettes, uncomplicated: Secondary | ICD-10-CM | POA: Insufficient documentation

## 2016-07-15 DIAGNOSIS — C3490 Malignant neoplasm of unspecified part of unspecified bronchus or lung: Secondary | ICD-10-CM

## 2016-07-15 DIAGNOSIS — H539 Unspecified visual disturbance: Secondary | ICD-10-CM

## 2016-07-15 NOTE — Telephone Encounter (Signed)
Called and spoke with Craig Wright Patients caregiver and transportation, patient is ok with doing a MRI and would like to have it the same day as the next CT, will schedule and call back.

## 2016-07-15 NOTE — Progress Notes (Signed)
Radiation Oncology Follow up Note  Name: Craig Wright   Date:   07/15/2016 MRN:  [id number] DOB: 1965/11/04    This 51 y.o. male presents to the clinic today for one-month follow-up status post concurrent chemoradiation for stage IIIa adenocarcinoma of the right lung.  REFERRING PROVIDER: No ref. provider found  HPI: Patient is a 51 year old male now out 1 month having completed combined modality treatment with chemotherapy and radiation therapy for stage IIIa (T2 N2 M0) adenocarcinoma of the right lung. He is seen today in routine follow-up and is doing fairly well. He states he is feeling much improved he still has a cough sometimes having difficulty expectorating.. Patient's had problems in the past with magnesium levels which seems to have stabilized he also has been put on Megace for poor appetite. He also is being set up for treatment for hepatitis C. He's had some mild pinkish sputum production. Recent plain film of the chest shows no evidence of disease.  COMPLICATIONS OF TREATMENT: none  FOLLOW UP COMPLIANCE: keeps appointments   PHYSICAL EXAM:  BP (!) 143/83   Pulse (!) 129   Temp 97.1 F (36.2 C)   Resp 20   Wt 169 lb 15.6 oz (77.1 kg)   BMI 25.84 kg/m  Well-developed male in nasal oxygen thin frail in NAD. Well-developed well-nourished patient in NAD. HEENT reveals PERLA, EOMI, discs not visualized.  Oral cavity is clear. No oral mucosal lesions are identified. Neck is clear without evidence of cervical or supraclavicular adenopathy. Lungs are clear to A&P. Cardiac examination is essentially unremarkable with regular rate and rhythm without murmur rub or thrill. Abdomen is benign with no organomegaly or masses noted. Motor sensory and DTR levels are equal and symmetric in the upper and lower extremities. Cranial nerves II through XII are grossly intact. Proprioception is intact. No peripheral adenopathy or edema is identified. No motor or sensory levels are noted. Crude  visual fields are within normal range.  RADIOLOGY RESULTS: Plain film reviewed patient scheduled for follow-up CT scan in June  PLAN: Present time patient is recovering well from his radiation chemotherapy treatments. I'm please was overall progress. I have ordered some Mucinex as well as occasional Robitussin D for his cough. He continues close follow-up care with medical oncology. He is scheduled for follow-up CT scan in June which I'll review. I've asked to see him back in 4 months for follow-up.  I would like to take this opportunity to thank you for allowing me to participate in the care of your patient.Armstead Peaks., MD

## 2016-07-16 ENCOUNTER — Other Ambulatory Visit: Payer: Self-pay | Admitting: *Deleted

## 2016-07-24 ENCOUNTER — Ambulatory Visit: Admission: RE | Admit: 2016-07-24 | Payer: Medicaid Other | Source: Ambulatory Visit

## 2016-07-24 ENCOUNTER — Ambulatory Visit: Payer: Medicaid Other

## 2016-07-25 ENCOUNTER — Ambulatory Visit: Payer: Medicaid Other

## 2016-07-27 ENCOUNTER — Ambulatory Visit: Payer: Medicaid Other | Admitting: Hematology and Oncology

## 2016-07-27 ENCOUNTER — Inpatient Hospital Stay: Payer: Medicaid Other

## 2016-07-27 ENCOUNTER — Other Ambulatory Visit: Payer: Medicaid Other

## 2016-07-27 ENCOUNTER — Inpatient Hospital Stay: Payer: Medicaid Other | Admitting: Hematology and Oncology

## 2016-07-31 ENCOUNTER — Ambulatory Visit: Payer: Medicaid Other

## 2016-08-03 ENCOUNTER — Other Ambulatory Visit: Payer: Medicaid Other

## 2016-08-03 ENCOUNTER — Ambulatory Visit: Payer: Medicaid Other | Admitting: Hematology and Oncology

## 2016-08-06 ENCOUNTER — Telehealth: Payer: Self-pay | Admitting: *Deleted

## 2016-08-06 ENCOUNTER — Ambulatory Visit
Admission: RE | Admit: 2016-08-06 | Discharge: 2016-08-06 | Disposition: A | Discharge status: Home / Self Care | Payer: Medicaid Other | Source: Ambulatory Visit | Attending: Hematology and Oncology | Admitting: Hematology and Oncology

## 2016-08-06 DIAGNOSIS — R590 Localized enlarged lymph nodes: Secondary | ICD-10-CM | POA: Insufficient documentation

## 2016-08-06 DIAGNOSIS — I700 Atherosclerosis of aorta: Secondary | ICD-10-CM | POA: Insufficient documentation

## 2016-08-06 DIAGNOSIS — R042 Hemoptysis: Secondary | ICD-10-CM

## 2016-08-06 DIAGNOSIS — E876 Hypokalemia: Secondary | ICD-10-CM

## 2016-08-06 DIAGNOSIS — H539 Unspecified visual disturbance: Secondary | ICD-10-CM

## 2016-08-06 DIAGNOSIS — K229 Disease of esophagus, unspecified: Secondary | ICD-10-CM | POA: Insufficient documentation

## 2016-08-06 DIAGNOSIS — R945 Abnormal results of liver function studies: Secondary | ICD-10-CM

## 2016-08-06 DIAGNOSIS — R7989 Other specified abnormal findings of blood chemistry: Secondary | ICD-10-CM | POA: Insufficient documentation

## 2016-08-06 DIAGNOSIS — J439 Emphysema, unspecified: Secondary | ICD-10-CM | POA: Diagnosis not present

## 2016-08-06 DIAGNOSIS — C3431 Malignant neoplasm of lower lobe, right bronchus or lung: Secondary | ICD-10-CM

## 2016-08-06 DIAGNOSIS — C3490 Malignant neoplasm of unspecified part of unspecified bronchus or lung: Secondary | ICD-10-CM

## 2016-08-06 MED ORDER — GADOBENATE DIMEGLUMINE 529 MG/ML IV SOLN
15.0000 mL | Freq: Once | INTRAVENOUS | Status: AC | PRN
  Administered 2016-08-06: 15 mL via INTRAVENOUS

## 2016-08-06 MED ORDER — IOPAMIDOL (ISOVUE-300) INJECTION 61%
75.0000 mL | Freq: Once | INTRAVENOUS | Status: AC | PRN
  Administered 2016-08-06: 75 mL via INTRAVENOUS

## 2016-08-06 NOTE — Telephone Encounter (Signed)
-----   Message from Lequita Asal, MD sent at 08/06/2016  8:51 AM EDT ----- Regarding: Please call patient  Ask if he is having any symptoms of pneumonia.  M  ----- Message ----- From: Interface, Rad Results In Sent: 08/06/2016   8:29 AM To: Lequita Asal, MD

## 2016-08-06 NOTE — Telephone Encounter (Signed)
Lowella Fairy, who brings patient to appointments to inquire if patient has complained of worsening symptoms (SOB, increased coughing, pain) that may indicate pneumonia.  Shanon Brow states a few weeks ago patient was coughing up a lot of pink tinged sputum, but that has since resolved.  Patient has complained of increased shortness of breath, otherwise no other symptoms.  Shanon Brow brought patient for MRI today.  He is currently out of town.  If additional instructions for patient call Jana Half @ [facility].  [phone].
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
## 2016-08-10 ENCOUNTER — Inpatient Hospital Stay (HOSPITAL_BASED_OUTPATIENT_CLINIC_OR_DEPARTMENT_OTHER): Payer: Medicaid Other | Admitting: Hematology and Oncology

## 2016-08-10 ENCOUNTER — Inpatient Hospital Stay: Payer: Medicaid Other | Attending: Hematology and Oncology

## 2016-08-10 VITALS — BP 115/63 | HR 67 | Temp 98.0°F | Resp 20 | Wt 173.3 lb

## 2016-08-10 DIAGNOSIS — Z7984 Long term (current) use of oral hypoglycemic drugs: Secondary | ICD-10-CM | POA: Diagnosis not present

## 2016-08-10 DIAGNOSIS — T451X5S Adverse effect of antineoplastic and immunosuppressive drugs, sequela: Secondary | ICD-10-CM

## 2016-08-10 DIAGNOSIS — M545 Low back pain: Secondary | ICD-10-CM | POA: Insufficient documentation

## 2016-08-10 DIAGNOSIS — J449 Chronic obstructive pulmonary disease, unspecified: Secondary | ICD-10-CM | POA: Insufficient documentation

## 2016-08-10 DIAGNOSIS — R7989 Other specified abnormal findings of blood chemistry: Secondary | ICD-10-CM

## 2016-08-10 DIAGNOSIS — J189 Pneumonia, unspecified organism: Secondary | ICD-10-CM

## 2016-08-10 DIAGNOSIS — R093 Abnormal sputum: Secondary | ICD-10-CM

## 2016-08-10 DIAGNOSIS — Z66 Do not resuscitate: Secondary | ICD-10-CM | POA: Insufficient documentation

## 2016-08-10 DIAGNOSIS — I110 Hypertensive heart disease with heart failure: Secondary | ICD-10-CM | POA: Insufficient documentation

## 2016-08-10 DIAGNOSIS — G8929 Other chronic pain: Secondary | ICD-10-CM

## 2016-08-10 DIAGNOSIS — Z9221 Personal history of antineoplastic chemotherapy: Secondary | ICD-10-CM

## 2016-08-10 DIAGNOSIS — I700 Atherosclerosis of aorta: Secondary | ICD-10-CM | POA: Insufficient documentation

## 2016-08-10 DIAGNOSIS — Z800 Family history of malignant neoplasm of digestive organs: Secondary | ICD-10-CM | POA: Diagnosis not present

## 2016-08-10 DIAGNOSIS — E119 Type 2 diabetes mellitus without complications: Secondary | ICD-10-CM | POA: Diagnosis not present

## 2016-08-10 DIAGNOSIS — R042 Hemoptysis: Secondary | ICD-10-CM

## 2016-08-10 DIAGNOSIS — I509 Heart failure, unspecified: Secondary | ICD-10-CM | POA: Insufficient documentation

## 2016-08-10 DIAGNOSIS — C3431 Malignant neoplasm of lower lobe, right bronchus or lung: Secondary | ICD-10-CM

## 2016-08-10 DIAGNOSIS — F419 Anxiety disorder, unspecified: Secondary | ICD-10-CM

## 2016-08-10 DIAGNOSIS — Z8049 Family history of malignant neoplasm of other genital organs: Secondary | ICD-10-CM

## 2016-08-10 DIAGNOSIS — R599 Enlarged lymph nodes, unspecified: Secondary | ICD-10-CM

## 2016-08-10 DIAGNOSIS — I2510 Atherosclerotic heart disease of native coronary artery without angina pectoris: Secondary | ICD-10-CM

## 2016-08-10 DIAGNOSIS — I4891 Unspecified atrial fibrillation: Secondary | ICD-10-CM | POA: Diagnosis not present

## 2016-08-10 DIAGNOSIS — F17210 Nicotine dependence, cigarettes, uncomplicated: Secondary | ICD-10-CM

## 2016-08-10 DIAGNOSIS — E876 Hypokalemia: Secondary | ICD-10-CM

## 2016-08-10 DIAGNOSIS — Z923 Personal history of irradiation: Secondary | ICD-10-CM | POA: Diagnosis not present

## 2016-08-10 DIAGNOSIS — R5383 Other fatigue: Secondary | ICD-10-CM

## 2016-08-10 DIAGNOSIS — Z79899 Other long term (current) drug therapy: Secondary | ICD-10-CM

## 2016-08-10 DIAGNOSIS — R945 Abnormal results of liver function studies: Secondary | ICD-10-CM

## 2016-08-10 LAB — CBC WITH DIFFERENTIAL/PLATELET
Basophils Absolute: 0 10*3/uL (ref 0–0.1)
Basophils Relative: 0 %
Eosinophils Absolute: 0.1 10*3/uL (ref 0–0.7)
Eosinophils Relative: 2 %
HCT: 32.7 % — ABNORMAL LOW (ref 40.0–52.0)
Hemoglobin: 11.5 g/dL — ABNORMAL LOW (ref 13.0–18.0)
Lymphocytes Relative: 11 %
Lymphs Abs: 0.5 10*3/uL — ABNORMAL LOW (ref 1.0–3.6)
MCH: 31.8 pg (ref 26.0–34.0)
MCHC: 35.2 g/dL (ref 32.0–36.0)
MCV: 90.4 fL (ref 80.0–100.0)
Monocytes Absolute: 0.7 10*3/uL (ref 0.2–1.0)
Monocytes Relative: 14 %
Neutro Abs: 3.7 10*3/uL (ref 1.4–6.5)
Neutrophils Relative %: 73 %
Platelets: 230 10*3/uL (ref 150–440)
RBC: 3.62 MIL/uL — ABNORMAL LOW (ref 4.40–5.90)
RDW: 14.7 % — ABNORMAL HIGH (ref 11.5–14.5)
WBC: 5.1 10*3/uL (ref 3.8–10.6)

## 2016-08-10 LAB — COMPREHENSIVE METABOLIC PANEL
ALT: 58 U/L (ref 17–63)
AST: 45 U/L — ABNORMAL HIGH (ref 15–41)
Albumin: 3.5 g/dL (ref 3.5–5.0)
Alkaline Phosphatase: 58 U/L (ref 38–126)
Anion gap: 9 (ref 5–15)
BUN: 11 mg/dL (ref 6–20)
CO2: 29 mmol/L (ref 22–32)
Calcium: 9.1 mg/dL (ref 8.9–10.3)
Chloride: 94 mmol/L — ABNORMAL LOW (ref 101–111)
Creatinine, Ser: 0.67 mg/dL (ref 0.61–1.24)
GFR calc Af Amer: >60 mL/min (ref >60)
GFR calc non Af Amer: >60 mL/min (ref >60)
Glucose, Bld: 137 mg/dL — ABNORMAL HIGH (ref 65–99)
Potassium: 3.8 mmol/L (ref 3.5–5.1)
Sodium: 132 mmol/L — ABNORMAL LOW (ref 135–145)
Total Bilirubin: 0.4 mg/dL (ref 0.3–1.2)
Total Protein: 8 g/dL (ref 6.5–8.1)

## 2016-08-10 LAB — MAGNESIUM: Magnesium: 1.7 mg/dL (ref 1.7–2.4)

## 2016-08-10 MED ORDER — LEVOFLOXACIN 500 MG PO TABS: 500.0000 mg | ORAL_TABLET | Freq: Every day | ORAL | 0 refills | Status: DC

## 2016-08-10 NOTE — Progress Notes (Signed)
Patient states he is more short of breath than usual and coughing up clear phlegm.  States he doesn't have much of an appetite.  Patient accompanied today by his mother and caretaker.

## 2016-08-19 ENCOUNTER — Telehealth: Payer: Self-pay | Admitting: *Deleted

## 2016-08-19 NOTE — Telephone Encounter (Signed)
Called and spoke with Izora Gala at Dr. Nicholes Stairs office and they will send a referral to pulmonology per Dr. Kem Parkinson recommendation.

## 2016-09-14 ENCOUNTER — Ambulatory Visit: Payer: Medicaid Other | Admitting: Hematology and Oncology

## 2016-09-14 ENCOUNTER — Other Ambulatory Visit: Payer: Medicaid Other

## 2016-09-15 ENCOUNTER — Ambulatory Visit: Payer: Medicaid Other

## 2016-09-16 ENCOUNTER — Encounter: Payer: Self-pay | Admitting: Hematology and Oncology

## 2016-09-17 ENCOUNTER — Ambulatory Visit: Payer: Medicaid Other

## 2016-09-18 ENCOUNTER — Ambulatory Visit: Payer: Medicaid Other | Admitting: Hematology and Oncology

## 2016-09-18 ENCOUNTER — Other Ambulatory Visit: Payer: Medicaid Other

## 2016-09-23 ENCOUNTER — Ambulatory Visit
Admission: RE | Admit: 2016-09-23 | Discharge: 2016-09-23 | Disposition: A | Discharge status: Home / Self Care | Payer: Medicaid Other | Source: Ambulatory Visit | Attending: Hematology and Oncology | Admitting: Hematology and Oncology

## 2016-09-23 DIAGNOSIS — C7971 Secondary malignant neoplasm of right adrenal gland: Secondary | ICD-10-CM | POA: Diagnosis not present

## 2016-09-23 DIAGNOSIS — C7951 Secondary malignant neoplasm of bone: Secondary | ICD-10-CM | POA: Insufficient documentation

## 2016-09-23 DIAGNOSIS — J984 Other disorders of lung: Secondary | ICD-10-CM | POA: Diagnosis not present

## 2016-09-23 DIAGNOSIS — J189 Pneumonia, unspecified organism: Secondary | ICD-10-CM | POA: Diagnosis present

## 2016-09-23 DIAGNOSIS — C3431 Malignant neoplasm of lower lobe, right bronchus or lung: Secondary | ICD-10-CM | POA: Insufficient documentation

## 2016-09-23 LAB — GLUCOSE, CAPILLARY: Glucose-Capillary: 119 mg/dL — ABNORMAL HIGH (ref 65–99)

## 2016-09-23 MED ORDER — FLUDEOXYGLUCOSE F - 18 (FDG) INJECTION
13.4000 | Freq: Once | INTRAVENOUS | Status: AC | PRN
  Administered 2016-09-23: 13.4 via INTRAVENOUS

## 2016-09-28 ENCOUNTER — Inpatient Hospital Stay: Payer: Medicaid Other | Attending: Hematology and Oncology | Admitting: Hematology and Oncology

## 2016-09-28 ENCOUNTER — Other Ambulatory Visit: Payer: Self-pay | Admitting: *Deleted

## 2016-09-28 ENCOUNTER — Encounter: Payer: Self-pay | Admitting: Hematology and Oncology

## 2016-09-28 VITALS — BP 122/84 | HR 128 | Temp 98.7°F | Resp 18 | Wt 174.6 lb

## 2016-09-28 DIAGNOSIS — M25569 Pain in unspecified knee: Secondary | ICD-10-CM

## 2016-09-28 DIAGNOSIS — C7971 Secondary malignant neoplasm of right adrenal gland: Secondary | ICD-10-CM | POA: Diagnosis not present

## 2016-09-28 DIAGNOSIS — I4891 Unspecified atrial fibrillation: Secondary | ICD-10-CM | POA: Insufficient documentation

## 2016-09-28 DIAGNOSIS — R634 Abnormal weight loss: Secondary | ICD-10-CM | POA: Insufficient documentation

## 2016-09-28 DIAGNOSIS — F17210 Nicotine dependence, cigarettes, uncomplicated: Secondary | ICD-10-CM | POA: Diagnosis not present

## 2016-09-28 DIAGNOSIS — I2510 Atherosclerotic heart disease of native coronary artery without angina pectoris: Secondary | ICD-10-CM | POA: Diagnosis not present

## 2016-09-28 DIAGNOSIS — M545 Low back pain: Secondary | ICD-10-CM | POA: Diagnosis not present

## 2016-09-28 DIAGNOSIS — R531 Weakness: Secondary | ICD-10-CM | POA: Diagnosis not present

## 2016-09-28 DIAGNOSIS — Z923 Personal history of irradiation: Secondary | ICD-10-CM

## 2016-09-28 DIAGNOSIS — I509 Heart failure, unspecified: Secondary | ICD-10-CM

## 2016-09-28 DIAGNOSIS — Z7984 Long term (current) use of oral hypoglycemic drugs: Secondary | ICD-10-CM | POA: Insufficient documentation

## 2016-09-28 DIAGNOSIS — J449 Chronic obstructive pulmonary disease, unspecified: Secondary | ICD-10-CM | POA: Insufficient documentation

## 2016-09-28 DIAGNOSIS — I110 Hypertensive heart disease with heart failure: Secondary | ICD-10-CM | POA: Insufficient documentation

## 2016-09-28 DIAGNOSIS — R5383 Other fatigue: Secondary | ICD-10-CM

## 2016-09-28 DIAGNOSIS — Z9981 Dependence on supplemental oxygen: Secondary | ICD-10-CM | POA: Diagnosis not present

## 2016-09-28 DIAGNOSIS — B182 Chronic viral hepatitis C: Secondary | ICD-10-CM | POA: Diagnosis not present

## 2016-09-28 DIAGNOSIS — Z79899 Other long term (current) drug therapy: Secondary | ICD-10-CM

## 2016-09-28 DIAGNOSIS — R7989 Other specified abnormal findings of blood chemistry: Secondary | ICD-10-CM | POA: Insufficient documentation

## 2016-09-28 DIAGNOSIS — Z800 Family history of malignant neoplasm of digestive organs: Secondary | ICD-10-CM

## 2016-09-28 DIAGNOSIS — C7951 Secondary malignant neoplasm of bone: Secondary | ICD-10-CM | POA: Diagnosis not present

## 2016-09-28 DIAGNOSIS — G8929 Other chronic pain: Secondary | ICD-10-CM | POA: Insufficient documentation

## 2016-09-28 DIAGNOSIS — C3431 Malignant neoplasm of lower lobe, right bronchus or lung: Secondary | ICD-10-CM | POA: Insufficient documentation

## 2016-09-28 DIAGNOSIS — Z8049 Family history of malignant neoplasm of other genital organs: Secondary | ICD-10-CM | POA: Insufficient documentation

## 2016-09-28 DIAGNOSIS — Z66 Do not resuscitate: Secondary | ICD-10-CM | POA: Insufficient documentation

## 2016-09-28 DIAGNOSIS — E1136 Type 2 diabetes mellitus with diabetic cataract: Secondary | ICD-10-CM | POA: Diagnosis not present

## 2016-09-28 DIAGNOSIS — M25559 Pain in unspecified hip: Secondary | ICD-10-CM

## 2016-09-28 DIAGNOSIS — Z9221 Personal history of antineoplastic chemotherapy: Secondary | ICD-10-CM | POA: Diagnosis not present

## 2016-09-28 DIAGNOSIS — T451X5S Adverse effect of antineoplastic and immunosuppressive drugs, sequela: Secondary | ICD-10-CM

## 2016-09-28 NOTE — Progress Notes (Signed)
Patient offers no complaints today.  Here for PET results.  Patient states he takes 10 mg. Oxycodone in the morning, 20 mg in the afternoon and 30 mg at night to help him sleep.
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
## 2016-09-29 ENCOUNTER — Telehealth: Payer: Self-pay | Admitting: *Deleted

## 2016-09-29 NOTE — Telephone Encounter (Signed)
Craig Loh, Craig Wright began calling Craig Wright yesterday [phone]) regarding this patient being covered on hospice services. Patient reports that he has been on hospice service since January/February of this year. Per Craig Wright, patient has been with their service since 04/2016. Craig Wright made hospice aware that patient has been receiving both chemo and radiation therapy, with the last treatment being in April of this year. Patient has had a recent repeat PET scan that demonstrated disease progression; new adrenal and bone metastasis. Hospice made aware of the disease progression; inquiry made whether patient can be on hospice services and continue to seek curative chemotherapy. Per Craig Wright, patient is being followed by them for Craig Wright (Craig Wright), not his cancer. Craig Wright made hospice representative aware that patient's current plan of care includes additional tumor testing (Foundation 1), liquid biopsy, and plans for continued treatment, including Xgeva. Hospice certification is being signed by patients PCP (Craig Wright). Craig Wright advising that she spoke with the director of operations Craig Wright) who advised that patient was "fine" to continue on hospice services despite the new clinical developments. Craig Wright made financial representatives here at the [facility] aware of the situation; Craig Wright spoke with Craig Wright regarding the aforementioned information.

## 2016-10-01 ENCOUNTER — Other Ambulatory Visit: Payer: Medicaid Other

## 2016-10-06 ENCOUNTER — Inpatient Hospital Stay: Payer: Medicaid Other

## 2016-10-06 DIAGNOSIS — J189 Pneumonia, unspecified organism: Secondary | ICD-10-CM

## 2016-10-06 DIAGNOSIS — C3431 Malignant neoplasm of lower lobe, right bronchus or lung: Secondary | ICD-10-CM | POA: Diagnosis not present

## 2016-10-06 LAB — CBC WITH DIFFERENTIAL/PLATELET
Basophils Absolute: 0 10*3/uL (ref 0–0.1)
Basophils Relative: 0 %
EOS ABS: 0.1 10*3/uL (ref 0–0.7)
Eosinophils Relative: 1 %
HEMATOCRIT: 30.4 % — AB (ref 40.0–52.0)
HEMOGLOBIN: 10.6 g/dL — AB (ref 13.0–18.0)
LYMPHS ABS: 0.5 10*3/uL — AB (ref 1.0–3.6)
Lymphocytes Relative: 9 %
MCH: 29 pg (ref 26.0–34.0)
MCHC: 34.8 g/dL (ref 32.0–36.0)
MCV: 83.3 fL (ref 80.0–100.0)
MONOS PCT: 12 %
Monocytes Absolute: 0.7 10*3/uL (ref 0.2–1.0)
NEUTROS ABS: 4.3 10*3/uL (ref 1.4–6.5)
Neutrophils Relative %: 78 %
Platelets: 339 10*3/uL (ref 150–440)
RBC: 3.65 MIL/uL — ABNORMAL LOW (ref 4.40–5.90)
RDW: 14.1 % (ref 11.5–14.5)
WBC: 5.5 10*3/uL (ref 3.8–10.6)

## 2016-10-06 LAB — COMPREHENSIVE METABOLIC PANEL
ALK PHOS: 73 U/L (ref 38–126)
ALT: 30 U/L (ref 17–63)
ANION GAP: 11 (ref 5–15)
AST: 35 U/L (ref 15–41)
Albumin: 3.3 g/dL — ABNORMAL LOW (ref 3.5–5.0)
BILIRUBIN TOTAL: 0.4 mg/dL (ref 0.3–1.2)
BUN: 10 mg/dL (ref 6–20)
CO2: 27 mmol/L (ref 22–32)
Calcium: 9.4 mg/dL (ref 8.9–10.3)
Chloride: 96 mmol/L — ABNORMAL LOW (ref 101–111)
Creatinine, Ser: 0.8 mg/dL (ref 0.61–1.24)
Glucose, Bld: 155 mg/dL — ABNORMAL HIGH (ref 65–99)
Potassium: 4.2 mmol/L (ref 3.5–5.1)
Sodium: 134 mmol/L — ABNORMAL LOW (ref 135–145)
TOTAL PROTEIN: 8.3 g/dL — AB (ref 6.5–8.1)

## 2016-10-07 ENCOUNTER — Ambulatory Visit: Payer: Medicaid Other

## 2016-10-07 ENCOUNTER — Other Ambulatory Visit: Payer: Self-pay | Admitting: *Deleted

## 2016-10-07 ENCOUNTER — Ambulatory Visit: Admission: RE | Admit: 2016-10-07 | Payer: Medicaid Other | Source: Ambulatory Visit

## 2016-10-07 DIAGNOSIS — C3431 Malignant neoplasm of lower lobe, right bronchus or lung: Secondary | ICD-10-CM

## 2016-10-07 LAB — CEA: CEA: 19.4 ng/mL — ABNORMAL HIGH (ref 0.0–4.7)

## 2016-10-09 ENCOUNTER — Inpatient Hospital Stay (HOSPITAL_BASED_OUTPATIENT_CLINIC_OR_DEPARTMENT_OTHER): Payer: Medicaid Other | Admitting: Hematology and Oncology

## 2016-10-09 ENCOUNTER — Encounter: Payer: Self-pay | Admitting: Hematology and Oncology

## 2016-10-09 ENCOUNTER — Inpatient Hospital Stay: Payer: Medicaid Other

## 2016-10-09 VITALS — BP 111/73 | HR 139 | Temp 98.0°F | Resp 28 | Ht 70.0 in | Wt 172.0 lb

## 2016-10-09 DIAGNOSIS — C7951 Secondary malignant neoplasm of bone: Secondary | ICD-10-CM

## 2016-10-09 DIAGNOSIS — I4891 Unspecified atrial fibrillation: Secondary | ICD-10-CM

## 2016-10-09 DIAGNOSIS — Z923 Personal history of irradiation: Secondary | ICD-10-CM

## 2016-10-09 DIAGNOSIS — F17210 Nicotine dependence, cigarettes, uncomplicated: Secondary | ICD-10-CM

## 2016-10-09 DIAGNOSIS — C3431 Malignant neoplasm of lower lobe, right bronchus or lung: Secondary | ICD-10-CM

## 2016-10-09 DIAGNOSIS — G8929 Other chronic pain: Secondary | ICD-10-CM

## 2016-10-09 DIAGNOSIS — R634 Abnormal weight loss: Secondary | ICD-10-CM | POA: Diagnosis not present

## 2016-10-09 DIAGNOSIS — Z7984 Long term (current) use of oral hypoglycemic drugs: Secondary | ICD-10-CM

## 2016-10-09 DIAGNOSIS — C7971 Secondary malignant neoplasm of right adrenal gland: Secondary | ICD-10-CM | POA: Diagnosis not present

## 2016-10-09 DIAGNOSIS — T451X5S Adverse effect of antineoplastic and immunosuppressive drugs, sequela: Secondary | ICD-10-CM | POA: Diagnosis not present

## 2016-10-09 DIAGNOSIS — Z79899 Other long term (current) drug therapy: Secondary | ICD-10-CM

## 2016-10-09 DIAGNOSIS — I509 Heart failure, unspecified: Secondary | ICD-10-CM

## 2016-10-09 DIAGNOSIS — J449 Chronic obstructive pulmonary disease, unspecified: Secondary | ICD-10-CM

## 2016-10-09 DIAGNOSIS — R7989 Other specified abnormal findings of blood chemistry: Secondary | ICD-10-CM | POA: Diagnosis not present

## 2016-10-09 DIAGNOSIS — E1136 Type 2 diabetes mellitus with diabetic cataract: Secondary | ICD-10-CM

## 2016-10-09 DIAGNOSIS — I2510 Atherosclerotic heart disease of native coronary artery without angina pectoris: Secondary | ICD-10-CM

## 2016-10-09 DIAGNOSIS — R531 Weakness: Secondary | ICD-10-CM

## 2016-10-09 DIAGNOSIS — Z9981 Dependence on supplemental oxygen: Secondary | ICD-10-CM

## 2016-10-09 DIAGNOSIS — R5383 Other fatigue: Secondary | ICD-10-CM

## 2016-10-09 DIAGNOSIS — M25559 Pain in unspecified hip: Secondary | ICD-10-CM

## 2016-10-09 DIAGNOSIS — B182 Chronic viral hepatitis C: Secondary | ICD-10-CM | POA: Diagnosis not present

## 2016-10-09 DIAGNOSIS — I110 Hypertensive heart disease with heart failure: Secondary | ICD-10-CM

## 2016-10-09 DIAGNOSIS — M545 Low back pain: Secondary | ICD-10-CM

## 2016-10-09 DIAGNOSIS — M25569 Pain in unspecified knee: Secondary | ICD-10-CM

## 2016-10-09 DIAGNOSIS — C7970 Secondary malignant neoplasm of unspecified adrenal gland: Secondary | ICD-10-CM | POA: Insufficient documentation

## 2016-10-09 DIAGNOSIS — Z800 Family history of malignant neoplasm of digestive organs: Secondary | ICD-10-CM

## 2016-10-09 DIAGNOSIS — Z9221 Personal history of antineoplastic chemotherapy: Secondary | ICD-10-CM

## 2016-10-09 DIAGNOSIS — Z66 Do not resuscitate: Secondary | ICD-10-CM

## 2016-10-09 LAB — CBC WITH DIFFERENTIAL/PLATELET
Basophils Absolute: 0 K/uL (ref 0–0.1)
Basophils Relative: 0 %
Eosinophils Absolute: 0 K/uL (ref 0–0.7)
Eosinophils Relative: 0 %
HCT: 32.2 % — ABNORMAL LOW (ref 40.0–52.0)
Hemoglobin: 10.9 g/dL — ABNORMAL LOW (ref 13.0–18.0)
Lymphocytes Relative: 6 %
Lymphs Abs: 0.5 K/uL — ABNORMAL LOW (ref 1.0–3.6)
MCH: 28 pg (ref 26.0–34.0)
MCHC: 33.7 g/dL (ref 32.0–36.0)
MCV: 83 fL (ref 80.0–100.0)
Monocytes Absolute: 1.1 K/uL — ABNORMAL HIGH (ref 0.2–1.0)
Monocytes Relative: 12 %
Neutro Abs: 7 K/uL — ABNORMAL HIGH (ref 1.4–6.5)
Neutrophils Relative %: 82 %
Platelets: 356 K/uL (ref 150–440)
RBC: 3.88 MIL/uL — ABNORMAL LOW (ref 4.40–5.90)
RDW: 14.6 % — ABNORMAL HIGH (ref 11.5–14.5)
WBC: 8.6 K/uL (ref 3.8–10.6)

## 2016-10-09 LAB — COMPREHENSIVE METABOLIC PANEL
ALT: 27 U/L (ref 17–63)
AST: 36 U/L (ref 15–41)
Albumin: 3.2 g/dL — ABNORMAL LOW (ref 3.5–5.0)
Alkaline Phosphatase: 74 U/L (ref 38–126)
Anion gap: 12 (ref 5–15)
BUN: 13 mg/dL (ref 6–20)
CO2: 25 mmol/L (ref 22–32)
Calcium: 9.2 mg/dL (ref 8.9–10.3)
Chloride: 94 mmol/L — ABNORMAL LOW (ref 101–111)
Creatinine, Ser: 0.85 mg/dL (ref 0.61–1.24)
GFR calc Af Amer: >60 mL/min (ref >60)
GFR calc non Af Amer: >60 mL/min (ref >60)
Glucose, Bld: 207 mg/dL — ABNORMAL HIGH (ref 65–99)
Potassium: 4.1 mmol/L (ref 3.5–5.1)
Sodium: 131 mmol/L — ABNORMAL LOW (ref 135–145)
Total Bilirubin: 0.6 mg/dL (ref 0.3–1.2)
Total Protein: 8.2 g/dL — ABNORMAL HIGH (ref 6.5–8.1)

## 2016-10-09 MED ORDER — DENOSUMAB 120 MG/1.7ML ~~LOC~~ SOLN
120.0000 mg | Freq: Once | SUBCUTANEOUS | Status: AC
  Administered 2016-10-09: 120 mg via SUBCUTANEOUS
  Filled 2016-10-09: qty 1.7

## 2016-10-09 NOTE — Progress Notes (Signed)
Patient here for follow up. He states his pain level is at a 6 generalized body, appetite poor, sleep requires medication. HR elevated today.
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
## 2016-10-10 LAB — CEA: CEA: 21.5 ng/mL — ABNORMAL HIGH (ref 0.0–4.7)

## 2016-10-11 ENCOUNTER — Emergency Department: Payer: Medicaid Other

## 2016-10-11 ENCOUNTER — Inpatient Hospital Stay
Admission: EM | Admit: 2016-10-11 | Discharge: 2016-10-21 | DRG: 947 | Disposition: A | Discharge status: Skilled Nursing Facility | Payer: Medicaid Other | Attending: Internal Medicine | Admitting: Internal Medicine

## 2016-10-11 DIAGNOSIS — Z96642 Presence of left artificial hip joint: Secondary | ICD-10-CM | POA: Diagnosis present

## 2016-10-11 DIAGNOSIS — E1165 Type 2 diabetes mellitus with hyperglycemia: Secondary | ICD-10-CM | POA: Diagnosis present

## 2016-10-11 DIAGNOSIS — J209 Acute bronchitis, unspecified: Secondary | ICD-10-CM | POA: Diagnosis present

## 2016-10-11 DIAGNOSIS — Z79899 Other long term (current) drug therapy: Secondary | ICD-10-CM

## 2016-10-11 DIAGNOSIS — I10 Essential (primary) hypertension: Secondary | ICD-10-CM | POA: Diagnosis present

## 2016-10-11 DIAGNOSIS — B182 Chronic viral hepatitis C: Secondary | ICD-10-CM | POA: Diagnosis present

## 2016-10-11 DIAGNOSIS — F419 Anxiety disorder, unspecified: Secondary | ICD-10-CM | POA: Diagnosis present

## 2016-10-11 DIAGNOSIS — J441 Chronic obstructive pulmonary disease with (acute) exacerbation: Secondary | ICD-10-CM | POA: Diagnosis present

## 2016-10-11 DIAGNOSIS — J440 Chronic obstructive pulmonary disease with acute lower respiratory infection: Secondary | ICD-10-CM | POA: Diagnosis present

## 2016-10-11 DIAGNOSIS — Z9109 Other allergy status, other than to drugs and biological substances: Secondary | ICD-10-CM

## 2016-10-11 DIAGNOSIS — F329 Major depressive disorder, single episode, unspecified: Secondary | ICD-10-CM | POA: Diagnosis present

## 2016-10-11 DIAGNOSIS — C3412 Malignant neoplasm of upper lobe, left bronchus or lung: Secondary | ICD-10-CM | POA: Diagnosis present

## 2016-10-11 DIAGNOSIS — I2510 Atherosclerotic heart disease of native coronary artery without angina pectoris: Secondary | ICD-10-CM | POA: Diagnosis present

## 2016-10-11 DIAGNOSIS — C7951 Secondary malignant neoplasm of bone: Secondary | ICD-10-CM | POA: Diagnosis present

## 2016-10-11 DIAGNOSIS — Z9981 Dependence on supplemental oxygen: Secondary | ICD-10-CM

## 2016-10-11 DIAGNOSIS — J9622 Acute and chronic respiratory failure with hypercapnia: Secondary | ICD-10-CM | POA: Diagnosis present

## 2016-10-11 DIAGNOSIS — Z79891 Long term (current) use of opiate analgesic: Secondary | ICD-10-CM

## 2016-10-11 DIAGNOSIS — Z800 Family history of malignant neoplasm of digestive organs: Secondary | ICD-10-CM

## 2016-10-11 DIAGNOSIS — Z66 Do not resuscitate: Secondary | ICD-10-CM | POA: Diagnosis present

## 2016-10-11 DIAGNOSIS — C3490 Malignant neoplasm of unspecified part of unspecified bronchus or lung: Secondary | ICD-10-CM

## 2016-10-11 DIAGNOSIS — Z923 Personal history of irradiation: Secondary | ICD-10-CM

## 2016-10-11 DIAGNOSIS — C3431 Malignant neoplasm of lower lobe, right bronchus or lung: Secondary | ICD-10-CM | POA: Diagnosis present

## 2016-10-11 DIAGNOSIS — I4891 Unspecified atrial fibrillation: Secondary | ICD-10-CM | POA: Diagnosis present

## 2016-10-11 DIAGNOSIS — M549 Dorsalgia, unspecified: Secondary | ICD-10-CM | POA: Diagnosis present

## 2016-10-11 DIAGNOSIS — T380X5A Adverse effect of glucocorticoids and synthetic analogues, initial encounter: Secondary | ICD-10-CM | POA: Diagnosis present

## 2016-10-11 DIAGNOSIS — F17210 Nicotine dependence, cigarettes, uncomplicated: Secondary | ICD-10-CM | POA: Diagnosis present

## 2016-10-11 DIAGNOSIS — Z7984 Long term (current) use of oral hypoglycemic drugs: Secondary | ICD-10-CM

## 2016-10-11 DIAGNOSIS — K5900 Constipation, unspecified: Secondary | ICD-10-CM | POA: Diagnosis present

## 2016-10-11 DIAGNOSIS — J9621 Acute and chronic respiratory failure with hypoxia: Secondary | ICD-10-CM | POA: Diagnosis present

## 2016-10-11 DIAGNOSIS — G893 Neoplasm related pain (acute) (chronic): Principal | ICD-10-CM | POA: Diagnosis present

## 2016-10-11 DIAGNOSIS — Z515 Encounter for palliative care: Secondary | ICD-10-CM

## 2016-10-11 DIAGNOSIS — Z7951 Long term (current) use of inhaled steroids: Secondary | ICD-10-CM

## 2016-10-11 LAB — COMPREHENSIVE METABOLIC PANEL
ALK PHOS: 74 U/L (ref 38–126)
ALT: 35 U/L (ref 17–63)
AST: 38 U/L (ref 15–41)
Albumin: 2.9 g/dL — ABNORMAL LOW (ref 3.5–5.0)
Anion gap: 8 (ref 5–15)
BILIRUBIN TOTAL: 0.2 mg/dL — AB (ref 0.3–1.2)
BUN: 14 mg/dL (ref 6–20)
CALCIUM: 8.3 mg/dL — AB (ref 8.9–10.3)
CO2: 29 mmol/L (ref 22–32)
CREATININE: 0.72 mg/dL (ref 0.61–1.24)
Chloride: 100 mmol/L — ABNORMAL LOW (ref 101–111)
GFR calc Af Amer: >60 mL/min (ref >60)
GLUCOSE: 194 mg/dL — AB (ref 65–99)
POTASSIUM: 4.1 mmol/L (ref 3.5–5.1)
Sodium: 137 mmol/L (ref 135–145)
TOTAL PROTEIN: 7.8 g/dL (ref 6.5–8.1)

## 2016-10-11 LAB — CBC
HEMATOCRIT: 31.6 % — AB (ref 40.0–52.0)
HEMOGLOBIN: 10.6 g/dL — AB (ref 13.0–18.0)
MCH: 28.3 pg (ref 26.0–34.0)
MCHC: 33.5 g/dL (ref 32.0–36.0)
MCV: 84.3 fL (ref 80.0–100.0)
Platelets: 355 10*3/uL (ref 150–440)
RBC: 3.75 MIL/uL — AB (ref 4.40–5.90)
RDW: 14.4 % (ref 11.5–14.5)
WBC: 6.1 10*3/uL (ref 3.8–10.6)

## 2016-10-11 MED ORDER — HYDROMORPHONE HCL 1 MG/ML IJ SOLN
INTRAMUSCULAR | Status: AC
  Filled 2016-10-11: qty 1

## 2016-10-11 MED ORDER — SODIUM CHLORIDE 0.9 % IV SOLN
1000.0000 mL | Freq: Once | INTRAVENOUS | Status: AC
  Administered 2016-10-11: 1000 mL via INTRAVENOUS

## 2016-10-11 MED ORDER — MORPHINE SULFATE (PF) 4 MG/ML IV SOLN
4.0000 mg | Freq: Once | INTRAVENOUS | Status: AC
  Administered 2016-10-11: 4 mg via INTRAVENOUS
  Filled 2016-10-11: qty 1

## 2016-10-11 MED ORDER — HYDROMORPHONE HCL 1 MG/ML IJ SOLN
1.0000 mg | Freq: Once | INTRAMUSCULAR | Status: AC
Start: 2016-10-11 — End: 2016-10-11
  Administered 2016-10-11: 1 mg via INTRAVENOUS

## 2016-10-11 MED ORDER — ONDANSETRON HCL 4 MG/2ML IJ SOLN
4.0000 mg | Freq: Once | INTRAMUSCULAR | Status: AC
  Administered 2016-10-11: 4 mg via INTRAVENOUS
  Filled 2016-10-11: qty 2
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
## 2016-10-11 NOTE — ED Notes (Signed)
Pt unable to void at this time - adv to call for nurse when he has to void

## 2016-10-11 NOTE — ED Triage Notes (Signed)
Pt arrived via ems for c/o fall when he went to sit down on the toilet he fell backwards and now is having left flank pain

## 2016-10-12 ENCOUNTER — Observation Stay: Payer: Medicaid Other

## 2016-10-12 DIAGNOSIS — M549 Dorsalgia, unspecified: Secondary | ICD-10-CM | POA: Diagnosis present

## 2016-10-12 LAB — BASIC METABOLIC PANEL
ANION GAP: 5 (ref 5–15)
BUN: 10 mg/dL (ref 6–20)
CHLORIDE: 105 mmol/L (ref 101–111)
CO2: 28 mmol/L (ref 22–32)
Calcium: 8.1 mg/dL — ABNORMAL LOW (ref 8.9–10.3)
Creatinine, Ser: 0.59 mg/dL — ABNORMAL LOW (ref 0.61–1.24)
GFR calc non Af Amer: >60 mL/min (ref >60)
Glucose, Bld: 121 mg/dL — ABNORMAL HIGH (ref 65–99)
POTASSIUM: 4.1 mmol/L (ref 3.5–5.1)
Sodium: 138 mmol/L (ref 135–145)

## 2016-10-12 LAB — URINALYSIS, COMPLETE (UACMP) WITH MICROSCOPIC
Bacteria, UA: NONE SEEN
Bilirubin Urine: NEGATIVE
GLUCOSE, UA: NEGATIVE mg/dL
Hgb urine dipstick: NEGATIVE
KETONES UR: NEGATIVE mg/dL
LEUKOCYTES UA: NEGATIVE
Nitrite: NEGATIVE
PH: 6 (ref 5.0–8.0)
PROTEIN: NEGATIVE mg/dL
RBC / HPF: NONE SEEN RBC/hpf (ref 0–5)
SQUAMOUS EPITHELIAL / LPF: NONE SEEN
Specific Gravity, Urine: 1.012 (ref 1.005–1.030)

## 2016-10-12 LAB — CBC
HEMATOCRIT: 30.5 % — AB (ref 40.0–52.0)
HEMOGLOBIN: 10.2 g/dL — AB (ref 13.0–18.0)
MCH: 28.1 pg (ref 26.0–34.0)
MCHC: 33.3 g/dL (ref 32.0–36.0)
MCV: 84.4 fL (ref 80.0–100.0)
Platelets: 316 10*3/uL (ref 150–440)
RBC: 3.61 MIL/uL — AB (ref 4.40–5.90)
RDW: 14.5 % (ref 11.5–14.5)
WBC: 5.3 10*3/uL (ref 3.8–10.6)

## 2016-10-12 LAB — GLUCOSE, CAPILLARY
GLUCOSE-CAPILLARY: 135 mg/dL — AB (ref 65–99)
GLUCOSE-CAPILLARY: 117 mg/dL — AB (ref 65–99)
Glucose-Capillary: 116 mg/dL — ABNORMAL HIGH (ref 65–99)
Glucose-Capillary: 165 mg/dL — ABNORMAL HIGH (ref 65–99)

## 2016-10-12 MED ORDER — PREDNISONE 50 MG PO TABS
60.0000 mg | ORAL_TABLET | Freq: Every day | ORAL | Status: DC
  Administered 2016-10-13 – 2016-10-15 (×3): 60 mg via ORAL
  Filled 2016-10-12 (×3): qty 1

## 2016-10-12 MED ORDER — IPRATROPIUM BROMIDE 0.02 % IN SOLN: 0.5000 mg | Freq: Four times a day (QID) | RESPIRATORY_TRACT | Status: DC | PRN

## 2016-10-12 MED ORDER — SENNA 8.6 MG PO TABS
2.0000 | ORAL_TABLET | Freq: Every day | ORAL | Status: DC | PRN
  Administered 2016-10-15 – 2016-10-18 (×2): 17.2 mg via ORAL
  Filled 2016-10-12 (×2): qty 2

## 2016-10-12 MED ORDER — DILTIAZEM HCL ER COATED BEADS 240 MG PO CP24
240.0000 mg | ORAL_CAPSULE | Freq: Every day | ORAL | Status: DC
  Administered 2016-10-12 – 2016-10-13 (×2): 240 mg via ORAL
  Filled 2016-10-12 (×2): qty 1

## 2016-10-12 MED ORDER — POLYETHYLENE GLYCOL 3350 17 G PO PACK
17.0000 g | PACK | Freq: Every day | ORAL | Status: DC | PRN
  Filled 2016-10-12 (×3): qty 1

## 2016-10-12 MED ORDER — INSULIN ASPART 100 UNIT/ML ~~LOC~~ SOLN
0.0000 [IU] | Freq: Every day | SUBCUTANEOUS | Status: DC
  Administered 2016-10-13: 21:00:00 2 [IU] via SUBCUTANEOUS
  Administered 2016-10-15: 22:00:00 3 [IU] via SUBCUTANEOUS
  Filled 2016-10-12 (×2): qty 1

## 2016-10-12 MED ORDER — GUAIFENESIN 100 MG/5ML PO SYRP
200.0000 mg | ORAL_SOLUTION | Freq: Three times a day (TID) | ORAL | Status: DC | PRN
  Administered 2016-10-12: 200 mg via ORAL
  Filled 2016-10-12 (×2): qty 10

## 2016-10-12 MED ORDER — DOCUSATE SODIUM 100 MG PO CAPS
100.0000 mg | ORAL_CAPSULE | Freq: Every day | ORAL | Status: DC | PRN
  Administered 2016-10-14 – 2016-10-21 (×5): 100 mg via ORAL
  Filled 2016-10-12 (×5): qty 1

## 2016-10-12 MED ORDER — ENOXAPARIN SODIUM 40 MG/0.4ML ~~LOC~~ SOLN
40.0000 mg | SUBCUTANEOUS | Status: DC
  Administered 2016-10-12: 40 mg via SUBCUTANEOUS
  Filled 2016-10-12 (×2): qty 0.4

## 2016-10-12 MED ORDER — HYDROMORPHONE HCL 1 MG/ML IJ SOLN
0.5000 mg | INTRAMUSCULAR | Status: DC | PRN
  Administered 2016-10-12 – 2016-10-16 (×17): 0.5 mg via INTRAVENOUS
  Filled 2016-10-12 (×17): qty 1

## 2016-10-12 MED ORDER — IPRATROPIUM-ALBUTEROL 20-100 MCG/ACT IN AERS: 1.0000 | INHALATION_SPRAY | RESPIRATORY_TRACT | Status: DC | PRN

## 2016-10-12 MED ORDER — ONDANSETRON 4 MG PO TBDP
4.0000 mg | ORAL_TABLET | Freq: Three times a day (TID) | ORAL | Status: DC | PRN
  Filled 2016-10-12 (×2): qty 1

## 2016-10-12 MED ORDER — SODIUM CHLORIDE 0.9% FLUSH: 3.0000 mL | INTRAVENOUS | Status: DC | PRN

## 2016-10-12 MED ORDER — ORAL CARE MOUTH RINSE
15.0000 mL | Freq: Two times a day (BID) | OROMUCOSAL | Status: DC
  Administered 2016-10-12 – 2016-10-21 (×12): 15 mL via OROMUCOSAL

## 2016-10-12 MED ORDER — INSULIN ASPART 100 UNIT/ML ~~LOC~~ SOLN
0.0000 [IU] | Freq: Three times a day (TID) | SUBCUTANEOUS | Status: DC
  Administered 2016-10-12: 13:00:00 2 [IU] via SUBCUTANEOUS
  Administered 2016-10-13 (×2): 3 [IU] via SUBCUTANEOUS
  Administered 2016-10-14: 12:00:00 11 [IU] via SUBCUTANEOUS
  Administered 2016-10-14: 17:00:00 5 [IU] via SUBCUTANEOUS
  Administered 2016-10-15 (×2): 3 [IU] via SUBCUTANEOUS
  Administered 2016-10-16: 8 [IU] via SUBCUTANEOUS
  Administered 2016-10-16 (×2): 2 [IU] via SUBCUTANEOUS
  Administered 2016-10-17: 3 [IU] via SUBCUTANEOUS
  Administered 2016-10-17: 8 [IU] via SUBCUTANEOUS
  Administered 2016-10-18: 5 [IU] via SUBCUTANEOUS
  Administered 2016-10-18: 3 [IU] via SUBCUTANEOUS
  Administered 2016-10-19: 13:00:00 8 [IU] via SUBCUTANEOUS
  Administered 2016-10-19: 1 [IU] via SUBCUTANEOUS
  Administered 2016-10-19 – 2016-10-20 (×2): 3 [IU] via SUBCUTANEOUS
  Administered 2016-10-20: 2 [IU] via SUBCUTANEOUS
  Administered 2016-10-20: 5 [IU] via SUBCUTANEOUS
  Administered 2016-10-21: 12:00:00 2 [IU] via SUBCUTANEOUS
  Administered 2016-10-21: 3 [IU] via SUBCUTANEOUS
  Filled 2016-10-12 (×22): qty 1

## 2016-10-12 MED ORDER — POTASSIUM CHLORIDE CRYS ER 20 MEQ PO TBCR
20.0000 meq | EXTENDED_RELEASE_TABLET | Freq: Two times a day (BID) | ORAL | Status: DC
  Administered 2016-10-12 – 2016-10-19 (×16): 20 meq via ORAL
  Filled 2016-10-12 (×16): qty 1

## 2016-10-12 MED ORDER — LIDOCAINE 5 % EX PTCH
1.0000 | MEDICATED_PATCH | CUTANEOUS | Status: DC
  Filled 2016-10-12 (×4): qty 3
  Filled 2016-10-12: qty 1
  Filled 2016-10-12 (×2): qty 3

## 2016-10-12 MED ORDER — GUAIFENESIN ER 600 MG PO TB12
600.0000 mg | ORAL_TABLET | Freq: Two times a day (BID) | ORAL | Status: DC
  Administered 2016-10-12 – 2016-10-18 (×13): 600 mg via ORAL
  Filled 2016-10-12 (×13): qty 1

## 2016-10-12 MED ORDER — DULOXETINE HCL 30 MG PO CPEP
60.0000 mg | ORAL_CAPSULE | Freq: Every day | ORAL | Status: DC
  Administered 2016-10-12 – 2016-10-21 (×10): 60 mg via ORAL
  Filled 2016-10-12 (×11): qty 2

## 2016-10-12 MED ORDER — CLONAZEPAM 0.5 MG PO TABS
1.0000 mg | ORAL_TABLET | Freq: Two times a day (BID) | ORAL | Status: DC
  Administered 2016-10-12 – 2016-10-21 (×20): 1 mg via ORAL
  Filled 2016-10-12 (×21): qty 2

## 2016-10-12 MED ORDER — ROFLUMILAST 500 MCG PO TABS
500.0000 ug | ORAL_TABLET | Freq: Every day | ORAL | Status: DC
  Administered 2016-10-12 – 2016-10-21 (×10): 500 ug via ORAL
  Filled 2016-10-12 (×10): qty 1

## 2016-10-12 MED ORDER — NICOTINE 21 MG/24HR TD PT24
21.0000 mg | MEDICATED_PATCH | Freq: Every day | TRANSDERMAL | Status: DC
  Administered 2016-10-12 – 2016-10-21 (×10): 21 mg via TRANSDERMAL
  Filled 2016-10-12 (×10): qty 1

## 2016-10-12 MED ORDER — SODIUM CHLORIDE 0.9 % IV SOLN: 250.0000 mL | INTRAVENOUS | Status: DC | PRN

## 2016-10-12 MED ORDER — ACETAMINOPHEN 325 MG PO TABS: 650.0000 mg | ORAL_TABLET | ORAL | Status: DC | PRN

## 2016-10-12 MED ORDER — MAGNESIUM OXIDE 400 (241.3 MG) MG PO TABS
400.0000 mg | ORAL_TABLET | Freq: Every day | ORAL | Status: DC
  Administered 2016-10-12 – 2016-10-21 (×10): 400 mg via ORAL
  Filled 2016-10-12 (×11): qty 1

## 2016-10-12 MED ORDER — MEGESTROL ACETATE 40 MG/ML PO SUSP
200.0000 mg | Freq: Every day | ORAL | Status: DC
  Administered 2016-10-12 – 2016-10-21 (×10): 200 mg via ORAL
  Filled 2016-10-12 (×10): qty 5

## 2016-10-12 MED ORDER — FUROSEMIDE 40 MG PO TABS
40.0000 mg | ORAL_TABLET | Freq: Every day | ORAL | Status: DC
  Administered 2016-10-12 – 2016-10-21 (×10): 40 mg via ORAL
  Filled 2016-10-12 (×10): qty 1

## 2016-10-12 MED ORDER — IPRATROPIUM-ALBUTEROL 0.5-2.5 (3) MG/3ML IN SOLN
3.0000 mL | RESPIRATORY_TRACT | Status: DC | PRN
  Administered 2016-10-12 – 2016-10-13 (×2): 3 mL via RESPIRATORY_TRACT
  Filled 2016-10-12 (×2): qty 3

## 2016-10-12 MED ORDER — SODIUM CHLORIDE 0.9% FLUSH
3.0000 mL | Freq: Two times a day (BID) | INTRAVENOUS | Status: DC
  Administered 2016-10-12 – 2016-10-21 (×21): 3 mL via INTRAVENOUS

## 2016-10-12 MED ORDER — ALBUTEROL SULFATE (2.5 MG/3ML) 0.083% IN NEBU
2.5000 mg | INHALATION_SOLUTION | Freq: Four times a day (QID) | RESPIRATORY_TRACT | Status: DC | PRN
  Administered 2016-10-12 – 2016-10-14 (×2): 2.5 mg via RESPIRATORY_TRACT
  Filled 2016-10-12 (×2): qty 3

## 2016-10-12 MED ORDER — MIRTAZAPINE 15 MG PO TABS
45.0000 mg | ORAL_TABLET | Freq: Every day | ORAL | Status: DC
  Administered 2016-10-12 – 2016-10-17 (×7): 45 mg via ORAL
  Filled 2016-10-12 (×7): qty 3

## 2016-10-12 MED ORDER — AZITHROMYCIN 500 MG PO TABS
500.0000 mg | ORAL_TABLET | Freq: Every day | ORAL | Status: AC
  Administered 2016-10-12 – 2016-10-14 (×3): 500 mg via ORAL
  Filled 2016-10-12 (×3): qty 1

## 2016-10-12 MED ORDER — OXYCODONE HCL ER 10 MG PO T12A
20.0000 mg | EXTENDED_RELEASE_TABLET | Freq: Two times a day (BID) | ORAL | Status: DC
  Administered 2016-10-12 – 2016-10-13 (×5): 20 mg via ORAL
  Filled 2016-10-12 (×5): qty 2

## 2016-10-12 MED ORDER — GADOBENATE DIMEGLUMINE 529 MG/ML IV SOLN: 15.0000 mL | Freq: Once | INTRAVENOUS | Status: DC | PRN

## 2016-10-12 MED ORDER — MOMETASONE FURO-FORMOTEROL FUM 100-5 MCG/ACT IN AERO
2.0000 | INHALATION_SPRAY | Freq: Two times a day (BID) | RESPIRATORY_TRACT | Status: DC
  Administered 2016-10-12 – 2016-10-21 (×18): 2 via RESPIRATORY_TRACT
  Filled 2016-10-12 (×2): qty 8.8

## 2016-10-12 MED ORDER — BUPROPION HCL ER (SR) 100 MG PO TB12
100.0000 mg | ORAL_TABLET | ORAL | Status: DC
  Administered 2016-10-12 – 2016-10-21 (×10): 100 mg via ORAL
  Filled 2016-10-12 (×10): qty 1

## 2016-10-12 MED ORDER — OXYCODONE HCL 5 MG PO TABS
10.0000 mg | ORAL_TABLET | Freq: Four times a day (QID) | ORAL | Status: DC
  Administered 2016-10-12 – 2016-10-14 (×9): 10 mg via ORAL
  Filled 2016-10-12 (×10): qty 2

## 2016-10-12 MED ORDER — ENSURE ENLIVE PO LIQD
237.0000 mL | Freq: Three times a day (TID) | ORAL | Status: DC
  Administered 2016-10-12 – 2016-10-21 (×25): 237 mL via ORAL

## 2016-10-12 NOTE — Consult Note (Signed)
Referring Physician:  No referring provider defined for this encounter.  Primary Physician:  Marygrace Drought, MD  Chief Complaint:  Back pain   History of Present Illness: Craig Wright is a 51 y.o. 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 Pain presented when he was sitting on the toilet last night and felt sudden pain in the left mid back. Pain has been constant and severe since it started. Denies radiation to upper and lower extremities, bladder/bowel dysfunction, recent falls.   [name] has no symptoms of cervical myelopathy.  The symptoms are causing a significant impact on the patient's life.   Review of Systems:  A 10 point review of systems is negative, except for the pertinent positives and negatives detailed in the HPI.  Past Medical History: Past Medical History:  Diagnosis Date  . A-fib ([address])   . Anxiety disorder   . Asthma   . CHF (congestive heart failure) ([place])   . COPD (chronic obstructive pulmonary disease) ([place])   . Coronary artery disease   . Diabetes mellitus without complication ([address])   . Hypertension     Past Surgical History: Past Surgical History:  Procedure Laterality Date  . KNEE SURGERY Left   . ORTHOPEDIC SURGERY     multiple  . SKIN GRAFT    . TOTAL HIP ARTHROPLASTY Left     Allergies: Allergies as of 10/11/2016 - Review Complete 10/11/2016  Allergen Reaction Noted  . Tape Rash 10/30/2015    Medications:  Current Facility-Administered Medications:  .  0.9 %  sodium chloride infusion, 250 mL, Intravenous, PRN, Hugelmeyer, Alexis, DO .  acetaminophen (TYLENOL) tablet 650 mg, 650 mg, Oral, Q4H PRN, Hugelmeyer, Alexis, DO .  albuterol (PROVENTIL) (2.5 MG/3ML) 0.083% nebulizer solution 2.5 mg, 2.5 mg, Nebulization, Q6H PRN, Hugelmeyer,  Alexis, DO, 2.5 mg at 10/12/16 0744 .  buPROPion [facility] SR) 12 hr tablet 100 mg, 100 mg, Oral, BH-q7a, Hugelmeyer, Alexis, DO, 100 mg at 10/12/16 0846 .  clonazePAM (KLONOPIN) tablet 1 mg, 1 mg, Oral, BID, Hugelmeyer, Alexis, DO, 1 mg at 10/12/16 0846 .  diltiazem (CARDIZEM CD) 24 hr capsule 240 mg, 240 mg, Oral, Daily, Hugelmeyer, Alexis, DO, 240 mg at 10/12/16 0846 .  docusate sodium (COLACE) capsule 100 mg, 100 mg, Oral, Daily PRN, Hugelmeyer, Alexis, DO .  DULoxetine (CYMBALTA) DR capsule 60 mg, 60 mg, Oral, Daily, Hugelmeyer, Alexis, DO, 60 mg at 10/12/16 0847 .  enoxaparin (LOVENOX) injection 40 mg, 40 mg, Subcutaneous, Q24H, Hugelmeyer, Alexis, DO .  furosemide (LASIX) tablet 40 mg, 40 mg, Oral, Daily, Hugelmeyer, Alexis, DO, 40 mg at 10/12/16 0847 .  guaifenesin (ROBITUSSIN) 100 MG/5ML syrup 200 mg, 200 mg, Oral, Q8H PRN, Hugelmeyer, Alexis, DO .  HYDROmorphone (DILAUDID) injection 0.5 mg, 0.5 mg, Intravenous, Q4H PRN, Hugelmeyer, Alexis, DO, 0.5 mg at 10/12/16 0548 .  insulin aspart (novoLOG) injection 0-15 Units, 0-15 Units, Subcutaneous, TID WC, Hugelmeyer, Alexis, DO .  insulin aspart (novoLOG) injection 0-5 Units, 0-5 Units, Subcutaneous, QHS, Hugelmeyer, Alexis, DO .  ipratropium (ATROVENT) nebulizer solution 0.5 mg, 0.5 mg, Nebulization, Q6H PRN, Hugelmeyer, Alexis, DO .  ipratropium-albuterol (DUONEB) 0.5-2.5 (3) MG/3ML nebulizer solution 3 mL, 3 mL, Nebulization, Q4H PRN, Hugelmeyer, Alexis, DO .  lidocaine (LIDODERM) 5 % 1-3 patch, 1-3 patch, Transdermal, Q24H, Hugelmeyer, Alexis, DO .  magnesium oxide (MAG-OX) tablet 400  mg, 400 mg, Oral, Daily, Hugelmeyer, Alexis, DO, 400 mg at 10/12/16 0846 .  MEDLINE mouth rinse, 15 mL, Mouth Rinse, BID, Hugelmeyer, Alexis, DO .  megestrol (MEGACE) 40 MG/ML suspension 200 mg, 200 mg, Oral, Daily, Hugelmeyer, Alexis, DO, 200 mg at 10/12/16 0846 .  mirtazapine (REMERON) tablet 45 mg, 45 mg, Oral, QHS, Hugelmeyer, Alexis, DO, 45 mg at  10/12/16 0159 .  mometasone-formoterol (DULERA) 100-5 MCG/ACT inhaler 2 puff, 2 puff, Inhalation, BID, Hugelmeyer, Alexis, DO, 2 puff at 10/12/16 0847 .  nicotine (NICODERM CQ - dosed in mg/24 hours) patch 21 mg, 21 mg, Transdermal, Daily, Hugelmeyer, Alexis, DO, 21 mg at 10/12/16 0846 .  ondansetron (ZOFRAN-ODT) disintegrating tablet 4 mg, 4 mg, Oral, Q8H PRN, Hugelmeyer, Alexis, DO .  oxyCODONE (Oxy IR/ROXICODONE) immediate release tablet 10 mg, 10 mg, Oral, QID, Hugelmeyer, Alexis, DO, 10 mg at 10/12/16 0847 .  oxyCODONE (OXYCONTIN) 12 hr tablet 20 mg, 20 mg, Oral, Q12H, Hugelmeyer, Alexis, DO, 20 mg at 10/12/16 0159 .  polyethylene glycol (MIRALAX / GLYCOLAX) packet 17 g, 17 g, Oral, Daily PRN, Hugelmeyer, Alexis, DO .  potassium chloride SA (K-DUR,KLOR-CON) CR tablet 20 mEq, 20 mEq, Oral, BID, Hugelmeyer, Alexis, DO, 20 mEq at 10/12/16 0159 .  roflumilast (DALIRESP) tablet 500 mcg, 500 mcg, Oral, Daily, Hugelmeyer, Alexis, DO, 500 mcg at 10/12/16 0846 .  senna (SENOKOT) tablet 17.2 mg, 2 tablet, Oral, Daily PRN, Hugelmeyer, Alexis, DO .  sodium chloride flush (NS) 0.9 % injection 3 mL, 3 mL, Intravenous, Q12H, Hugelmeyer, Alexis, DO, 3 mL at 10/12/16 0206 .  sodium chloride flush (NS) 0.9 % injection 3 mL, 3 mL, Intravenous, PRN, Hugelmeyer, Alexis, DO   Social History: Social History  Substance Use Topics  . Smoking status: Former Smoker    Packs/day: 0.00    Years: 40.00  . Smokeless tobacco: Never Used  . Alcohol use No    Family Medical History: Family History  Problem Relation Age of Onset  . Cervical cancer Mother   . Colon cancer Father     Physical Examination: Vitals:   10/12/16 0135 10/12/16 0744  BP: 130/79   Pulse: (!) 117   Resp:    Temp: 97.9 F (36.6 C)   SpO2: 93% 92%     General: Patient is well developed, well nourished, calm, collected, and in moderate apparent distress.  Psychiatric: Patient is non-anxious.  Head:  Pupils equal, round, and  reactive to light.  Respiratory: Breathing is heavy on oxygen delivery via nasal cannula.   Extremities: Bilateral edema on lower extremities.   Skin:   On exposed skin, there are no abnormal skin lesions. Right hand contracted inward. Large mass abnormality left distal forearm.    NEUROLOGICAL:  General: moderate  distress.   Awake, alert, oriented to person, place, and time.  Pupils equal round and reactive to light.  Facial tone is symmetric.  Tongue protrusion is midline.  There is no pronator drift.  ROM of spine: not assessed Palpation of spine: Point tenderness midline low thoracic.     Strength: Side Biceps Triceps Deltoid Interossei Grip Wrist Ext. Wrist Flex.  R 5 5 4+ - 5 5 5   L 5 5 4+ - 4 4 4    Side Iliopsoas Quads Hamstring PF DF EHL  R Unable  unable unable 5 5 5   L Unable unable unable 4 4 4    Left drop foot - previously present.   Reflexes are 1+ and symmetric at the biceps, triceps, brachioradialis, patella  and achilles.   Bilateral upper and lower extremity sensation is intact to light touch.  Toes are down-going.  Gait not assessed. Hoffman's is absent.  Imaging: EXAM: NUCLEAR MEDICINE PET SKULL BASE TO THIGH  TECHNIQUE: 13.4 mCi F-18 FDG was injected intravenously. Full-ring PET imaging was performed from the skull base to thigh after the radiotracer. CT data was obtained and used for attenuation correction and anatomic localization.  The PET data acquisition was terminated after completing a skullbase to iliac crest data acquisition. Patient was extremely uncomfortable could not tolerate lying supine.  FASTING BLOOD GLUCOSE:  Value: 119 mg/dl  COMPARISON:  CT 08/06/2016, PET-CT 1198  FINDINGS: NECK  No hypermetabolic lymph nodes in the neck.  CHEST  RIGHT lower lobe mass is similar in size and metabolic activity measuring 2.7 cm with SUV max equal 7.3 compared to 11.6 on prior. However, there are new nodules in the RIGHT lower lobe  compared to most recent CT scan and PET-CT scan which are hypermetabolic.  Additionally there is a new hypermetabolic nodule within the LEFT lung upper lobe measuring 10 mm (image 96, series 3) with SUV max equal 4.1. Second lingular nodule is hypermetabolic on image 038.  There is new hypermetabolic mediastinal lymphadenopathy. For example subcarinal lymph node with SUV max equals 6.0. RIGHT paratracheal nodes which are hypermetabolic. RIGHT hilar lymph node is newly enlarged and hypermetabolic.  Prevascular lymph nodes are hypermetabolic with SUV max equal 3.9.  ABDOMEN/PELVIS  There is new enlargement of the RIGHT adrenal gland which is hypermetabolic with SUV max equals 7.1.  The more inferior abdomen and pelvis is not imaged by PET imaging.imaging. Defect within the RIGHT iliac wing is favored related to prior bone harvesting.  SKELETON  New hypermetabolic lesion in the B33 pedicle on the LEFT (image 142, series 3) with SUV max equal 6.5. New rib lesion in T8 on the LEFT adjacent to the transverse process with SUV max equal 4.6.  IMPRESSION: 1. Lung cancer progression with new hypermetabolic RIGHT lower lobe and LEFT upper lobe pulmonary nodules. 2. New hypermetabolic mediastinal lymph nodes consistent metastasis. 3. Hypermetabolic RIGHT adrenal gland metastasis. 4. New skeletal metastasis to the T10 vertebral body and LEFT rib. T 5. Inferior abdomen and pelvis not imaged by PET due to patient discomfort.   Electronically Signed   By: Suzy Bouchard M.D.   On: 09/23/2016 13:51  I have personally reviewed the images and agree with the above interpretation.  Assessment and Plan: Mr. Bekker is a pleasant 51 y.o. male with metastatic bone cancer who presents as an inpatient consult for back/left flank pain. PET scan on 09/23/2016 revealed new lesion at T10 pedical on the left and rib lesion in T8 on the left.  Recommend obtaining thoracic and lumbar MRI  with and without contrast, as well as thoracic and lumbar CT without contrast.  Depending on image findings, may recommend interventional radiology consult for kyphoplasty.  Marin Olp, PA-C Dept. of Neurosurgery

## 2016-10-12 NOTE — Progress Notes (Signed)
Advanced care plan.  Purpose of the Encounter: CODE STATUS  Parties in Attendance: Patient himself  Patient's Decision Capacity: Intact  Subjective/Patient's story: Patient is 51 year old white male with metastatic lung cancer presenting with back pain   Objective/Medical story Patient was previously DO NOT RESUSCITATE now full code I discussed with them regarding CODE STATUS intubation CPR he states that he would not want these   Goals of care determination: DO NOT RESUSCITATE    CODE STATUS: DO NOT RESUSCITATE   Time spent discussing advanced care planning: 18minutes
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
## 2016-10-12 NOTE — Progress Notes (Signed)
PT Cancellation Note  Patient Details Name: Craig Wright MRN: [id number] DOB: Apr 29, 1965   Cancelled Treatment:    Reason Eval/Treat Not Completed: Medical issues which prohibited therapy.  Per Neurosurgery note: "Recommend obtaining thoracic and lumbar MRI with and without contrast, as well as thoracic and lumbar CT without contrast. Depending on image findings, may recommend interventional radiology consult for kyphoplasty."  Will hold PT until imaging complete and POC is established.   Collie Siad PT, DPT 10/12/2016, 11:49 AM

## 2016-10-12 NOTE — Clinical Social Work Note (Signed)
Clinical Social Work Assessment  Patient Details  Name: Craig Wright MRN: [id number] Date of Birth: 09-27-1965  Date of referral:  10/12/16               Reason for consult:  Facility Placement                Permission sought to share information with:  Facility Sport and exercise psychologist, Family Supports Permission granted to share information::  Yes, Verbal Permission Granted  Name::     Sydnee Levans Mother [phone]  [phone] or Lillia Pauls    [phone]   Agency::  Group Home and SNF admissions  Relationship::     Contact Information:     Housing/Transportation Living arrangements for the past 2 months:  Group Home Source of Information:  Patient Patient Interpreter Needed:  None Criminal Activity/Legal Involvement Pertinent to Current Situation/Hospitalization:  No - Comment as needed Significant Relationships:  Other Family Members, Other(Comment) Lives with:  Other (Comment) ([facility].) Do you feel safe going back to the place where you live?  Yes Need for family participation in patient care:  No (Coment)  Care giving concerns:  Patient is satisfied with the care that is being provided however he would like some PT to improve his strengthening.  He is in agreement to going to SNF if he has to pending PT recommendations.   Social Worker assessment / plan:  Patient is a 51 year old male who lives in a family care home called [facility].  Patient states he has been there for about 2 years, he is alert and oriented x4.  Patient stated he would like to receive some therapy if he can to improve strengthening.   CSW explained to patient since he only has Medicaid it will make it difficult to find a SNF which can accept patient.  CSW also told patient if he goes to a SNF he will have to be there for at least 30 days.  Patient expressed understanding and is in agreement to going to SNF if he can or if he needs to discharge back to group home with home  health he is okay with it.  Patient did not have any other questions or concerns.   Employment status:  Disabled (Comment on whether or not currently receiving Disability) Insurance information:  Medicaid [place] PT Recommendations:  Not assessed at this time Information / Referral to community resources:     Patient/Family's Response to care:  Patient in agreement to going to either SNF or returning back to family care home.  Patient/Family's Understanding of and Emotional Response to Diagnosis, Current Treatment, and Prognosis:  Patient expressed that he would like to receive some physical therapy if he can to improve his strengthening.  Patient is aware of current treatment plan and prognosis.  Emotional Assessment Appearance:  Appears stated age Attitude/Demeanor/Rapport:    Affect (typically observed):  Appropriate, Calm Orientation:  Oriented to Place, Oriented to Self, Oriented to  Time, Oriented to Situation Alcohol / Substance use:  Not Applicable Psych involvement (Current and /or in the community):  No (Comment)  Discharge Needs  Concerns to be addressed:  Care Coordination Readmission within the last 30 days:  No Current discharge risk:  None Barriers to Discharge:  Continued Medical Work up   Anell Barr 10/12/2016, 5:58 PM

## 2016-10-12 NOTE — Progress Notes (Signed)
[place] [facility]                                                                                                                                                                                  Patient Demographics   Craig Wright, is a 51 y.o. male, DOB - 03-Apr-1965, ZOX:[id number]  Admit date - 10/11/2016   Admitting Physician Harvie Bridge, DO  Outpatient Primary MD for the patient is Marygrace Drought, MD   LOS - 0  Subjective: Patient known to me from before has a metastatic lung cancer Complains of pain all over Also complains of cough and shortness of breath    Review of Systems:   CONSTITUTIONAL: No documented fever. No fatigue, weakness. No weight gain, no weight loss.  EYES: No blurry or double vision.  ENT: No tinnitus. No postnasal drip. No redness of the oropharynx.  RESPIRATORY:Positive cough, positive wheeze, no hemoptysis. Positive dyspnea.  CARDIOVASCULAR: No chest pain. No orthopnea. No palpitations. No syncope.  GASTROINTESTINAL: No nausea, no vomiting or diarrhea. No abdominal pain. No melena or hematochezia.  GENITOURINARY: No dysuria or hematuria.  ENDOCRINE: No polyuria or nocturia. No heat or cold intolerance.  HEMATOLOGY: No anemia. No bruising. No bleeding.  INTEGUMENTARY: No rashes. No lesions.  MUSCULOSKELETAL: No arthritis. No swelling. No gout.  NEUROLOGIC: No numbness, tingling, or ataxia. No seizure-type activity.  PSYCHIATRIC: No anxiety. No insomnia. No ADD.    Vitals:   Vitals:   10/12/16 0135 10/12/16 0744 10/12/16 1103 10/12/16 1200  BP: 130/79   (!) 114/54  Pulse: (!) 117   (!) 134  Resp:      Temp: 97.9 F (36.6 C)   98.5 F (36.9 C)  TempSrc: Oral   Axillary  SpO2: 93% 92% 93% 92%  Weight:      Height:        Wt Readings from Last 3 Encounters:  10/11/16 170 lb (77.1 kg)  10/09/16 172 lb (78 kg)  09/28/16 174 lb 9 oz (79.2 kg)     Intake/Output Summary (Last 24 hours) at 10/12/16  1452 Last data filed at 10/12/16 1232  Gross per 24 hour  Intake             1240 ml  Output             1050 ml  Net              190 ml    Physical Exam:   GENERAL: Pleasant-appearing in no apparent distress.  HEAD, EYES, EARS, NOSE AND THROAT: Atraumatic, normocephalic. Extraocular muscles are intact. Pupils equal and reactive to light. Sclerae anicteric. No conjunctival injection. No  oro-pharyngeal erythema.  NECK: Supple. There is no jugular venous distention. No bruits, no lymphadenopathy, no thyromegaly.  HEART: Regular rate and rhythm,. No murmurs, no rubs, no clicks.  LUNGS: Wheezing and rhonchus breath sounds on the right lung, left lung occasional wheezing  ABDOMEN: Soft, flat, nontender, nondistended. Has good bowel sounds. No hepatosplenomegaly appreciated.  EXTREMITIES: No evidence of any cyanosis, clubbing, or peripheral edema.  +2 pedal and radial pulses bilaterally.  NEUROLOGIC: The patient is alert, awake, and oriented x3 with no focal motor or sensory deficits appreciated bilaterally.  SKIN: Moist and warm with no rashes appreciated.  Psych: Not anxious, depressed LN: No inguinal LN enlargement    Antibiotics   Anti-infectives    Start     Dose/Rate Route Frequency Ordered Stop   10/12/16 1230  azithromycin (ZITHROMAX) tablet 500 mg     500 mg Oral Daily 10/12/16 1133        Medications   Scheduled Meds: . azithromycin  500 mg Oral Daily  . buPROPion  100 mg Oral BH-q7a  . clonazePAM  1 mg Oral BID  . diltiazem  240 mg Oral Daily  . DULoxetine  60 mg Oral Daily  . enoxaparin (LOVENOX) injection  40 mg Subcutaneous Q24H  . furosemide  40 mg Oral Daily  . guaiFENesin  600 mg Oral BID  . insulin aspart  0-15 Units Subcutaneous TID WC  . insulin aspart  0-5 Units Subcutaneous QHS  . lidocaine  1-3 patch Transdermal Q24H  . magnesium oxide  400 mg Oral Daily  . mouth rinse  15 mL Mouth Rinse BID  . megestrol  200 mg Oral Daily  . mirtazapine  45 mg Oral  QHS  . mometasone-formoterol  2 puff Inhalation BID  . nicotine  21 mg Transdermal Daily  . oxyCODONE  10 mg Oral QID  . oxyCODONE  20 mg Oral Q12H  . potassium chloride SA  20 mEq Oral BID  . [START ON 10/13/2016] predniSONE  60 mg Oral Q breakfast  . roflumilast  500 mcg Oral Daily  . sodium chloride flush  3 mL Intravenous Q12H   Continuous Infusions: . sodium chloride     PRN Meds:.sodium chloride, acetaminophen, albuterol, docusate sodium, guaifenesin, HYDROmorphone (DILAUDID) injection, ipratropium, ipratropium-albuterol, ondansetron, polyethylene glycol, senna, sodium chloride flush   Data Review:   Micro Results No results found for this or any previous visit (from the past 240 hour(s)).  Radiology Reports Nm Pet Image Initial (pi) Skull Base To Thigh  Result Date: 09/23/2016 CLINICAL DATA:  Subsequent treatment strategy for lung carcinoma. EXAM: NUCLEAR MEDICINE PET SKULL BASE TO THIGH TECHNIQUE: 13.4 mCi F-18 FDG was injected intravenously. Full-ring PET imaging was performed from the skull base to thigh after the radiotracer. CT data was obtained and used for attenuation correction and anatomic localization. The PET data acquisition was terminated after completing a skullbase to iliac crest data acquisition. Patient was extremely uncomfortable could not tolerate lying supine. FASTING BLOOD GLUCOSE:  Value: 119 mg/dl COMPARISON:  CT 08/06/2016, PET-CT 1198 FINDINGS: NECK No hypermetabolic lymph nodes in the neck. CHEST RIGHT lower lobe mass is similar in size and metabolic activity measuring 2.7 cm with SUV max equal 7.3 compared to 11.6 on prior. However, there are new nodules in the RIGHT lower lobe compared to most recent CT scan and PET-CT scan which are hypermetabolic. Additionally there is a new hypermetabolic nodule within the LEFT lung upper lobe measuring 10 mm (image 96, series 3) with SUV  max equal 4.1. Second lingular nodule is hypermetabolic on image 756. There is new  hypermetabolic mediastinal lymphadenopathy. For example subcarinal lymph node with SUV max equals 6.0. RIGHT paratracheal nodes which are hypermetabolic. RIGHT hilar lymph node is newly enlarged and hypermetabolic. Prevascular lymph nodes are hypermetabolic with SUV max equal 3.9. ABDOMEN/PELVIS There is new enlargement of the RIGHT adrenal gland which is hypermetabolic with SUV max equals 7.1. The more inferior abdomen and pelvis is not imaged by PET imaging.imaging. Defect within the RIGHT iliac wing is favored related to prior bone harvesting. SKELETON New hypermetabolic lesion in the E33 pedicle on the LEFT (image 142, series 3) with SUV max equal 6.5. New rib lesion in T8 on the LEFT adjacent to the transverse process with SUV max equal 4.6. IMPRESSION: 1. Lung cancer progression with new hypermetabolic RIGHT lower lobe and LEFT upper lobe pulmonary nodules. 2. New hypermetabolic mediastinal lymph nodes consistent metastasis. 3. Hypermetabolic RIGHT adrenal gland metastasis. 4. New skeletal metastasis to the T10 vertebral body and LEFT rib. T 5. Inferior abdomen and pelvis not imaged by PET due to patient discomfort. Electronically Signed   By: Suzy Bouchard M.D.   On: 09/23/2016 13:51   Ct Renal Stone Study  Result Date: 10/11/2016 CLINICAL DATA:  Acute left flank pain.  Known lung cancer. EXAM: CT ABDOMEN AND PELVIS WITHOUT CONTRAST TECHNIQUE: Multidetector CT imaging of the abdomen and pelvis was performed following the standard protocol without IV contrast. COMPARISON:  PET-CT 09/23/2016 FINDINGS: Lower chest: Pulmonary masses are seen within the right middle lobe, right lower lobe and left lower lobe, consistent with known metastatic lung cancer. There is a small right pleural effusion. Small pericardial effusion. Hepatobiliary: No focal liver abnormality is seen. No gallstones, gallbladder wall thickening, or biliary dilatation. Pancreas: Unremarkable. No pancreatic ductal dilatation or surrounding  inflammatory changes. Spleen: Normal in size without focal abnormality. Adrenals/Urinary Tract: Known metastatic right adrenal mass measures 18 mm in cross-section. No evidence of nephrolithiasis, hydronephrosis or obstructive uropathy. The urinary bladder is normal, although partially obscured by beam hardening artifact from left hip prosthesis. Stomach/Bowel: Stomach is within normal limits. Appendix appears normal. No evidence of bowel wall thickening, distention, or inflammatory changes. Vascular/Lymphatic: Calcific atherosclerotic disease of the abdominal aorta. Numerous mildly enlarged retroperitoneal lymph nodes centered about the takeoff of the renal arteries. Reproductive: Prostate is unremarkable. Other: No abdominal wall hernia or abnormality. No abdominopelvic ascites. Musculoskeletal: 10 mm sclerotic focus within the superior aspect of L4 vertebral body. The previously demonstrated by PET-CT metastatic deposit within T10 vertebral body corresponds to a lytic lesion within the left pedicle, axial image 6/91 sequence 2 and sagittal image 101/165, sequence 6. There is a small soft tissue component to this lesion. Large permeative lytic lesion within the left ilium, bordering the SI joint measures 5.3 cm. IMPRESSION: No evidence of obstructive uropathy or nephrolithiasis. Pulmonary masses within the right middle lobe, right lower lobe, left lower lobe, consistent with known metastatic lung cancer. Small to moderate right pleural effusion. Small pericardial effusion. Known metastatic right adrenal mass. Numerous mildly enlarged retroperitoneal upper abdominal lymph nodes, likely metastatic. T10 left pedicle metastatic lytic lesion. Given its proximity to the osseous neural foramen, it may have caused patient's symptoms. Large permeative lytic lesion within the left ilium causing cortical destruction, probably also metastatic. Electronically Signed   By: Fidela Salisbury M.D.   On: 10/11/2016 21:11      CBC  Recent Labs Lab 10/06/16 1002 10/09/16 1001 10/11/16 2032 10/12/16 0346  WBC 5.5 8.6 6.1 5.3  HGB 10.6* 10.9* 10.6* 10.2*  HCT 30.4* 32.2* 31.6* 30.5*  PLT 339 356 355 316  MCV 83.3 83.0 84.3 84.4  MCH 29.0 28.0 28.3 28.1  MCHC 34.8 33.7 33.5 33.3  RDW 14.1 14.6* 14.4 14.5  LYMPHSABS 0.5* 0.5*  --   --   MONOABS 0.7 1.1*  --   --   EOSABS 0.1 0.0  --   --   BASOSABS 0.0 0.0  --   --     Chemistries   Recent Labs Lab 10/06/16 1002 10/09/16 1001 10/11/16 2032 10/12/16 0346  NA 134* 131* 137 138  K 4.2 4.1 4.1 4.1  CL 96* 94* 100* 105  CO2 27 25 29 28   GLUCOSE 155* 207* 194* 121*  BUN 10 13 14 10   CREATININE 0.80 0.85 0.72 0.59*  CALCIUM 9.4 9.2 8.3* 8.1*  AST 35 36 38  --   ALT 30 27 35  --   ALKPHOS 73 74 74  --   BILITOT 0.4 0.6 0.2*  --    ------------------------------------------------------------------------------------------------------------------ estimated creatinine clearance is 115 mL/min (A) (by C-G formula based on SCr of 0.59 mg/dL (L)). ------------------------------------------------------------------------------------------------------------------ No results for input(s): HGBA1C in the last 72 hours. ------------------------------------------------------------------------------------------------------------------ No results for input(s): CHOL, HDL, LDLCALC, TRIG, CHOLHDL, LDLDIRECT in the last 72 hours. ------------------------------------------------------------------------------------------------------------------ No results for input(s): TSH, T4TOTAL, T3FREE, THYROIDAB in the last 72 hours.  Invalid input(s): FREET3 ------------------------------------------------------------------------------------------------------------------ No results for input(s): VITAMINB12, FOLATE, FERRITIN, TIBC, IRON, RETICCTPCT in the last 72 hours.  Coagulation profile No results for input(s): INR, PROTIME in the last 168 hours.  No results for input(s):  DDIMER in the last 72 hours.  Cardiac Enzymes No results for input(s): CKMB, TROPONINI, MYOGLOBIN in the last 168 hours.  Invalid input(s): CK ------------------------------------------------------------------------------------------------------------------ Invalid input(s): [place]   This is a 51 y.o. 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 Intractable left-sided back pain likely related to lytic lesion at T10 as well as cancer pain from bony metastases -Seen by neurosurgery they're recommending MRI of the lumbar spine and thoracic spine as well as CT of the lumbar spine and thoracic spine They've recommended kyphoplasty based on MRI findings  #Acute on chronic COPD exasperation as well as acute bronchi Continue nebulizer therapy I will add prednisone and azithromycin  #. Lung cancer with metastatic disease Prognosis poor  #. History of hypertension and atrial fibrillation -Continue Cardizem  #. History of depression - Continue Wellbutrin, Cymbalta  #. History of diabetes - Continue regular insulin sliding scale coverage   #. Tobacco Use disorder - Smoking cessation provided 45min spent strongly recommend he stop smoking nicotine patch will this given  #Patient was previously DO NOT RESUSCITATE now full code I have spoken to him and confirmed his DO NOT RESUSCITATE status please see separate ACP note     Code Status Orders        Start     Ordered   10/12/16 0134  Full code  Continuous     10/12/16 0133    Code Status History    Date Active Date Inactive Code Status Order ID Comments User Context   10/12/2016  1:18 AM 10/12/2016  1:33 AM DNR [id number]  Harvie Bridge, DO ED   01/23/2016 10:03 PM 01/26/2016  2:54 PM DNR  [id number]  Fritzi Mandes, MD Inpatient    12/09/2015 11:40 AM 12/12/2015  4:07 PM Full Code [id number]  Epifanio Lesches, MD ED   10/24/2015  1:10 PM 10/27/2015  2:10 PM DNR [id number]  Gladstone Lighter, MD ED   10/20/2015 10:51 AM 10/23/2015  4:37 PM DNR [id number]  Laverle Hobby, MD Inpatient   10/19/2015  8:55 AM 10/20/2015 10:51 AM Full Code [id number]  Dustin Flock, MD Inpatient   10/18/2015 12:29 PM 10/19/2015  8:55 AM DNR [id number]  Laverle Hobby, MD Inpatient   10/18/2015  8:53 AM 10/18/2015 12:29 PM Full Code [id number]  Loletha Grayer, MD Inpatient   10/15/2015  4:43 PM 10/18/2015  8:53 AM DNR [id number]  Lytle Butte, MD ED   10/15/2015  4:43 PM 10/15/2015  4:43 PM Full Code [id number]  Hower, Aaron Mose, MD ED   06/26/2015 10:13 AM 06/28/2015  6:05 PM DNR [id number]  Bettey Costa, MD Inpatient   06/26/2015  6:05 AM 06/26/2015 10:13 AM Full Code [id number]  Saundra Shelling, MD Inpatient   06/08/2015 12:15 AM 06/10/2015  4:32 PM Full Code [id number]  Saundra Shelling, MD Inpatient    Advance Directive Documentation     Most Recent Value  Type of Advance Directive  Healthcare Power of [place], Living will  Pre-existing out of facility DNR order (yellow form or pink MOST form)  -  "MOST" Form in Place?  -           Consults  neurosurgery  DVT Prophylaxis  Lovenox    Lab Results  Component Value Date   PLT 316 10/12/2016     Time Spent in minutes   69min  Greater than 50% of time spent in care coordination and counseling patient regarding the condition and plan of care.   Dustin Flock M.D on 10/12/2016 at 2:52 PM  Between 7am to 6pm - Pager - [phone]  After 6pm go to [email or web address] - password [email or web address] [facility]   Office  [phone]

## 2016-10-12 NOTE — H&P (Signed)
History and Physical   [facility] - [place] @ [facility] Admission History and Physical [employer], D.O.    Patient Name: Craig Wright MR#: [id number] Date of Birth: 03-28-1965 Date of Admission: 10/11/2016  Referring MD/NP/PA: Dr. Corky Downs Primary Care Physician: Marygrace Drought, MD  Chief Complaint:  Chief Complaint  Patient presents with  . Fall  . Flank Pain    HPI: Amr Sturtevant is a 51 y.o. 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  Patient was in a usual state of health until today when he was sitting on the toilet and felt a pop in his left mid back.Marland Kitchen He reports worsening of his shortness of breath because of pain with deep inspiration.   Patient denies fevers/chills, weakness, dizziness, chest pain, N/V/C/D, abdominal pain, dysuria/frequency, changes in mental status.    Otherwise there has been no change in status. Patient has been taking medication as prescribed and there has been no recent change in medication or diet.  No recent antibiotics.  There has been no recent illness, hospitalizations, travel or sick contacts.    EMS/ED Course: Patient received morphine and Dilaudid as well as Zofran. Medical admission has been requested for further management of both his cancer related pain as well as pain related to a lytic lesion at the left pedicle of T10 which may be also responsible for the patient's..  Review of Systems:  CONSTITUTIONAL: No fever/chills, fatigue, weakness, weight gain/loss, headache. EYES: No blurry or double vision. ENT: No tinnitus, postnasal drip, redness or soreness of the oropharynx. RESPIRATORY: Positive chronic dyspnea. No cough, dyspnea, wheeze.  No hemoptysis.  CARDIOVASCULAR: No chest pain, palpitations, syncope,  orthopnea. No lower extremity edema.  GASTROINTESTINAL: No nausea, vomiting, abdominal pain, diarrhea, constipation.  No hematemesis, melena or hematochezia. GENITOURINARY: No dysuria, frequency, hematuria. ENDOCRINE: No polyuria or nocturia. No heat or cold intolerance. HEMATOLOGY: No anemia, bruising, bleeding. INTEGUMENTARY: No rashes, ulcers, lesions. MUSCULOSKELETAL: No arthritis, gout, dyspnea. Positive left flank pain NEUROLOGIC: No numbness, tingling, ataxia, seizure-type activity, weakness. PSYCHIATRIC: No anxiety, depression, insomnia.   Past Medical History:  Diagnosis Date  . A-fib ([place])   . Anxiety disorder   . Asthma   . CHF (congestive heart failure) ([place])   . COPD (chronic obstructive pulmonary disease) ([place])   . Coronary artery disease   . Diabetes mellitus without complication ([place])   . Hypertension     Past Surgical History:  Procedure Laterality Date  . KNEE SURGERY Left   . ORTHOPEDIC SURGERY     multiple  . SKIN GRAFT    . TOTAL HIP ARTHROPLASTY Left      reports that he has been smoking Cigarettes.  He has a 10.00 pack-year smoking history. He has never used smokeless tobacco. He reports that he does not drink alcohol or use drugs.  Allergies  Allergen Reactions  . Tape Rash    Family History  Problem Relation Age of Onset  . Cervical cancer Mother   . Colon cancer Father     Prior to Admission medications   Medication Sig Start Date End Date Taking? Authorizing Provider  acetaminophen (TYLENOL) 325 MG tablet Take 650 mg by mouth every 4 (four) hours as needed (for pain/fever).    Yes [provider]  buPROPion (WELLBUTRIN SR) 100 MG 12 hr tablet Take 100 mg  by mouth every morning.   Yes [provider]  clonazePAM (KLONOPIN) 1 MG tablet Take 1 mg by mouth 2 (two) times daily.   Yes [provider]  diltiazem (CARDIZEM CD) 240 MG 24 hr capsule Take 1 capsule (240 mg total) by mouth daily. 12/13/15  Yes Gouru, Illene Silver,  MD  docusate sodium (COLACE) 50 MG capsule Take 2 capsules (100 mg total) by mouth daily as needed for mild constipation. 05/05/16  Yes Lucendia Herrlich, NP  DULoxetine (CYMBALTA) 60 MG capsule Take 60 mg by mouth daily.    Yes [provider]  furosemide (LASIX) 40 MG tablet Take 40 mg by mouth daily.    Yes [provider]  guaifenesin (COUGH SYRUP) 100 MG/5ML syrup Take 200 mg by mouth. 07/24/16  Yes [provider]  Ipratropium-Albuterol (COMBIVENT) 20-100 MCG/ACT AERS respimat Inhale 1 puff into the lungs every 4 (four) hours as needed for wheezing.  12/20/15  Yes [provider]  ipratropium-albuterol (DUONEB) 0.5-2.5 (3) MG/3ML SOLN Take 3 mLs by nebulization every 4 (four) hours as needed (for wheezing/shortness of breath).    Yes [provider]  levofloxacin (LEVAQUIN) 500 MG tablet Take 1 tablet (500 mg total) by mouth daily. 08/10/16  Yes Corcoran, Drue Second, MD  magnesium oxide (MAG-OX) 400 MG tablet Take 1 tablet (400 mg total) by mouth daily. 04/15/16  Yes Burns, Wandra Feinstein, NP  megestrol (MEGACE) 40 MG/ML suspension TAKE 1 TEASPOONFUL (67ml) BY MOUTH ONCE DAILY. 06/09/16  Yes Corcoran, Drue Second, MD  metFORMIN (GLUMETZA) 500 MG (MOD) 24 hr tablet Take 2 tablets (1,000 mg total) by mouth 2 (two) times daily. Reported on 06/26/2015 10/27/15  Yes Dustin Flock, MD  mirtazapine (REMERON) 45 MG tablet Take 45 mg by mouth at bedtime.   Yes [provider]  mometasone-formoterol (DULERA) 100-5 MCG/ACT AERO Inhale 2 puffs into the lungs 2 (two) times daily. 06/28/15  Yes Fritzi Mandes, MD  nicotine (NICODERM CQ - DOSED IN MG/24 HOURS) 21 mg/24hr patch Place 1 patch (21 mg total) onto the skin daily. 12/13/15  Yes Gouru, Aruna, MD  ondansetron (ZOFRAN-ODT) 4 MG disintegrating tablet Take 4 mg by mouth every 8 (eight) hours as needed for nausea or vomiting.   Yes [provider]  oxyCODONE (OXYCONTIN) 20 mg 12 hr tablet Take 20 mg by mouth every  12 (twelve) hours.   Yes [provider]  Oxycodone HCl 10 MG TABS Take 10 mg by mouth 4 (four) times daily. 8am, 12n, 4pm, 8pm   Yes [provider]  OXYGEN Inhale 5 L into the lungs.   Yes [provider]  polyethylene glycol (MIRALAX / GLYCOLAX) packet Take 17 g by mouth daily as needed (for constipation).    Yes [provider]  potassium chloride SA (K-DUR,KLOR-CON) 20 MEQ tablet Take 20 mEq by mouth 2 (two) times daily.   Yes [provider]  roflumilast (DALIRESP) 500 MCG TABS tablet Take 500 mcg by mouth daily.   Yes [provider]  senna (SENNA-LAX) 8.6 MG tablet Take 2 tablets by mouth daily as needed for constipation.   Yes [provider]    Physical Exam: Vitals:   10/11/16 2230 10/11/16 2300 10/11/16 2330 10/12/16 0000  BP: 130/71 129/83 126/75 117/79  Pulse: (!) 116 (!) 120 (!) 117 (!) 117  Resp: 20 20 20 18   Temp:      TempSrc:      SpO2: (!) 87% (!) 88% 90% 90%  Weight:      Height:        GENERAL: 51 y.o.-year-old male patient, chronically ill-appearing, pale fatigued lying in the bed in no acute distress.  Pleasant and cooperative.   HEENT: Head atraumatic, normocephalic. Pupils equal. Mucus membranes moist. NECK: Supple, full range of motion. No JVD, no bruit heard. No thyroid enlargement, no tenderness, no cervical lymphadenopathy. CHEST: Normal breath sounds bilaterally. No wheezing, rales, rhonchi or crackles. No use of accessory muscles of respiration.  No reproducible chest wall tenderness.  CARDIOVASCULAR: S1, S2 normal. No murmurs, rubs, or gallops. Cap refill <2 seconds. Pulses intact distally.  ABDOMEN: Soft, distended, nontender. No rebound, guarding, rigidity. Normoactive bowel sounds present in all four quadrants.  EXTREMITIES: No pedal edema, cyanosis, or clubbing. No calf tenderness or Homan's sign. L hand contracture and swollen chronic from old injurt NEUROLOGIC: The patient is alert and  oriented x 3. Cranial nerves II through XII are grossly intact with no focal sensorimotor deficit. PSYCHIATRIC:  Normal affect, mood, thought content. SKIN: Warm, dry, and intact without obvious rash, lesion, or ulcer.    Labs on Admission:  CBC:  Recent Labs Lab 10/06/16 1002 10/09/16 1001 10/11/16 2032  WBC 5.5 8.6 6.1  NEUTROABS 4.3 7.0*  --   HGB 10.6* 10.9* 10.6*  HCT 30.4* 32.2* 31.6*  MCV 83.3 83.0 84.3  PLT 339 356 604   Basic Metabolic Panel:  Recent Labs Lab 10/06/16 1002 10/09/16 1001 10/11/16 2032  NA 134* 131* 137  K 4.2 4.1 4.1  CL 96* 94* 100*  CO2 27 25 29   GLUCOSE 155* 207* 194*  BUN 10 13 14   CREATININE 0.80 0.85 0.72  CALCIUM 9.4 9.2 8.3*   GFR: Estimated Creatinine Clearance: 115 mL/min (by C-G formula based on SCr of 0.72 mg/dL). Liver Function Tests:  Recent Labs Lab 10/06/16 1002 10/09/16 1001 10/11/16 2032  AST 35 36 38  ALT 30 27 35  ALKPHOS 73 74 74  BILITOT 0.4 0.6 0.2*  PROT 8.3* 8.2* 7.8  ALBUMIN 3.3* 3.2* 2.9*   No results for input(s): LIPASE, AMYLASE in the last 168 hours. No results for input(s): AMMONIA in the last 168 hours. Coagulation Profile: No results for input(s): INR, PROTIME in the last 168 hours. Cardiac Enzymes: No results for input(s): CKTOTAL, CKMB, CKMBINDEX, TROPONINI in the last 168 hours. BNP (last 3 results) No results for input(s): PROBNP in the last 8760 hours. HbA1C: No results for input(s): HGBA1C in the last 72 hours. CBG: No results for input(s): GLUCAP in the last 168 hours. Lipid Profile: No results for input(s): CHOL, HDL, LDLCALC, TRIG, CHOLHDL, LDLDIRECT in the last 72 hours. Thyroid Function Tests: No results for input(s): TSH, T4TOTAL, FREET4, T3FREE, THYROIDAB in the last 72 hours. Anemia Panel: No results for input(s): VITAMINB12, FOLATE, FERRITIN, TIBC, IRON, RETICCTPCT in the last 72 hours. Urine analysis:    Component Value Date/Time   COLORURINE YELLOW (A) 10/15/2015 1535    APPEARANCEUR CLEAR (A) 10/15/2015 1535   APPEARANCEUR Clear 12/26/2012 1502   LABSPEC 1.009 10/15/2015 1535   LABSPEC 1.011 12/26/2012 1502   PHURINE 5.0 10/15/2015 1535   GLUCOSEU NEGATIVE 10/15/2015 1535   GLUCOSEU Negative 12/26/2012 1502   HGBUR NEGATIVE 10/15/2015 1535   BILIRUBINUR NEGATIVE 10/15/2015 1535   BILIRUBINUR Negative 12/26/2012 1502   KETONESUR NEGATIVE 10/15/2015 1535   PROTEINUR NEGATIVE 10/15/2015 1535   NITRITE NEGATIVE 10/15/2015 1535   LEUKOCYTESUR NEGATIVE 10/15/2015 1535   LEUKOCYTESUR Negative 12/26/2012 1502   Sepsis  Labs: @LABRCNTIP (procalcitonin:4,lacticidven:4) )No results found for this or any previous visit (from the past 240 hour(s)).   Radiological Exams on Admission: Ct Renal Stone Study  Result Date: 10/11/2016 CLINICAL DATA:  Acute left flank pain.  Known lung cancer. EXAM: CT ABDOMEN AND PELVIS WITHOUT CONTRAST TECHNIQUE: Multidetector CT imaging of the abdomen and pelvis was performed following the standard protocol without IV contrast. COMPARISON:  PET-CT 09/23/2016 FINDINGS: Lower chest: Pulmonary masses are seen within the right middle lobe, right lower lobe and left lower lobe, consistent with known metastatic lung cancer. There is a small right pleural effusion. Small pericardial effusion. Hepatobiliary: No focal liver abnormality is seen. No gallstones, gallbladder wall thickening, or biliary dilatation. Pancreas: Unremarkable. No pancreatic ductal dilatation or surrounding inflammatory changes. Spleen: Normal in size without focal abnormality. Adrenals/Urinary Tract: Known metastatic right adrenal mass measures 18 mm in cross-section. No evidence of nephrolithiasis, hydronephrosis or obstructive uropathy. The urinary bladder is normal, although partially obscured by beam hardening artifact from left hip prosthesis. Stomach/Bowel: Stomach is within normal limits. Appendix appears normal. No evidence of bowel wall thickening, distention, or  inflammatory changes. Vascular/Lymphatic: Calcific atherosclerotic disease of the abdominal aorta. Numerous mildly enlarged retroperitoneal lymph nodes centered about the takeoff of the renal arteries. Reproductive: Prostate is unremarkable. Other: No abdominal wall hernia or abnormality. No abdominopelvic ascites. Musculoskeletal: 10 mm sclerotic focus within the superior aspect of L4 vertebral body. The previously demonstrated by PET-CT metastatic deposit within T10 vertebral body corresponds to a lytic lesion within the left pedicle, axial image 6/91 sequence 2 and sagittal image 101/165, sequence 6. There is a small soft tissue component to this lesion. Large permeative lytic lesion within the left ilium, bordering the SI joint measures 5.3 cm. IMPRESSION: No evidence of obstructive uropathy or nephrolithiasis. Pulmonary masses within the right middle lobe, right lower lobe, left lower lobe, consistent with known metastatic lung cancer. Small to moderate right pleural effusion. Small pericardial effusion. Known metastatic right adrenal mass. Numerous mildly enlarged retroperitoneal upper abdominal lymph nodes, likely metastatic. T10 left pedicle metastatic lytic lesion. Given its proximity to the osseous neural foramen, it may have caused patient's symptoms. Large permeative lytic lesion within the left ilium causing cortical destruction, probably also metastatic. Electronically Signed   By: Fidela Salisbury M.D.   On: 10/11/2016 21:11   Assessment/Plan  This is a 51 y.o. 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 Intractable left-sided back pain likely related to lytic lesion at T10 as well as cancer pain from bony metastases -Admit to observation -Pain control, TLSO brace -PT evaluation - Ortho spine consult  #. History of  O2  dependent COPD andlung cancer with metastases to the bone -Continue Robitussin, O2 and nebulizers as needed,  -Pain control -Continue Dulera, DAliresp  #. History of hypertension and atrial fibrillation -Continue Cardizem  #. History of depression - Continue Wellbutrin, Cymbalta  #. History of diabetes - Continue regular insulin sliding scale coverage - Hold metformin  #. Tobacco Use disorder - Nicotine patch  Admission status: Observation IV Fluids: HL Diet/Nutrition: HH, CC Consults called: PT, Ortho spine  DVT Px: Lovenox, SCDs and early ambulation. Code Status: DNR Disposition Plan: To home in 1-2 days  All the records are reviewed and case discussed with ED provider. Management plans discussed with the patient and/or family who express understanding and agree with  plan of care.  Dmiyah Liscano D.O. on 10/12/2016 at 12:25 AM Between 7am to 6pm - Pager - [phone] After 6pm go to [email or web address] - [employer] [facility] Office [phone] CC: Primary care physician; Marygrace Drought, MD   10/12/2016, 12:25 AM

## 2016-10-12 NOTE — Progress Notes (Signed)
PT Cancellation Note  Patient Details Name: MAHMUD KEITHLY MRN: [id number] DOB: [birth year]   Cancelled Treatment:    Reason Eval/Treat Not Completed: Medical issues which prohibited therapy.  Pending Ortho consult.  Will hold PT until consult is completed and POC has been decided.  Thank you for this order.   Collie Siad PT, DPT 10/12/2016, 8:11 AM

## 2016-10-13 LAB — HIV ANTIBODY (ROUTINE TESTING W REFLEX): HIV SCREEN 4TH GENERATION: NONREACTIVE

## 2016-10-13 LAB — GLUCOSE, CAPILLARY
GLUCOSE-CAPILLARY: 173 mg/dL — AB (ref 65–99)
Glucose-Capillary: 109 mg/dL — ABNORMAL HIGH (ref 65–99)
Glucose-Capillary: 177 mg/dL — ABNORMAL HIGH (ref 65–99)
Glucose-Capillary: 203 mg/dL — ABNORMAL HIGH (ref 65–99)

## 2016-10-13 MED ORDER — SENNOSIDES-DOCUSATE SODIUM 8.6-50 MG PO TABS
1.0000 | ORAL_TABLET | Freq: Two times a day (BID) | ORAL | Status: DC
  Administered 2016-10-13 – 2016-10-18 (×11): 1 via ORAL
  Filled 2016-10-13 (×11): qty 1

## 2016-10-13 MED ORDER — TRAMADOL HCL 50 MG PO TABS
50.0000 mg | ORAL_TABLET | Freq: Four times a day (QID) | ORAL | Status: DC | PRN
  Administered 2016-10-17 – 2016-10-21 (×2): 50 mg via ORAL
  Filled 2016-10-13 (×4): qty 1

## 2016-10-13 MED ORDER — DILTIAZEM HCL ER COATED BEADS 300 MG PO CP24
300.0000 mg | ORAL_CAPSULE | Freq: Every day | ORAL | Status: DC
  Administered 2016-10-14 – 2016-10-21 (×8): 300 mg via ORAL
  Filled 2016-10-13 (×8): qty 1

## 2016-10-13 NOTE — Progress Notes (Signed)
PT Cancellation Note  Patient Details Name: Craig Wright MRN: [id number] DOB: [birth year]   Cancelled Treatment:    Reason Eval/Treat Not Completed: Medical issues which prohibited therapy. Per most recent neurosurgery note plan is to obtain MRI to ensure no significant canal compromise.  Additionally, it has been recommended for pt to have kyphoplasty, pt is unsure if he wants to have this procedure but is considering it.  Will hold PT until MRI has been completed and cleared by Neurosurgery for PT to work with pt.   Collie Siad PT, DPT 10/13/2016, 8:48 AM
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
## 2016-10-13 NOTE — Progress Notes (Signed)
[place] at [facility]                                                                                                                                                                                  Patient Demographics   Craig Wright, is a 51 y.o. male, DOB - September 05, 1965, EXB:[id number]  Admit date - 10/11/2016   Admitting Physician Harvie Bridge, DO  Outpatient Primary MD for the patient is Marygrace Drought, MD   LOS - 0  Subjective: Still complains of shortness of breath and pain all over breathing improved   Review of Systems:   CONSTITUTIONAL: No documented fever. No fatigue, weakness. No weight gain, no weight loss.  EYES: No blurry or double vision.  ENT: No tinnitus. No postnasal drip. No redness of the oropharynx.  RESPIRATORY:Positive cough, positive wheeze, no hemoptysis. Positive dyspnea.  CARDIOVASCULAR: No chest pain. No orthopnea. No palpitations. No syncope.  GASTROINTESTINAL: No nausea, no vomiting or diarrhea. No abdominal pain. No melena or hematochezia.  GENITOURINARY: No dysuria or hematuria.  ENDOCRINE: No polyuria or nocturia. No heat or cold intolerance.  HEMATOLOGY: No anemia. No bruising. No bleeding.  INTEGUMENTARY: No rashes. No lesions.  MUSCULOSKELETAL: No arthritis. No swelling. No gout. Pain all over NEUROLOGIC: No numbness, tingling, or ataxia. No seizure-type activity.  PSYCHIATRIC: No anxiety. No insomnia. No ADD.    Vitals:   Vitals:   10/12/16 1103 10/12/16 1200 10/12/16 2001 10/13/16 0508  BP:  (!) 114/54 128/79 132/79  Pulse:  (!) 134 (!) 123 (!) 123  Resp:   18 18  Temp:  98.5 F (36.9 C) 98.4 F (36.9 C) 97.7 F (36.5 C)  TempSrc:  Axillary Oral Oral  SpO2: 93% 92% 98% 93%  Weight:      Height:        Wt Readings from Last 3 Encounters:  10/11/16 170 lb (77.1 kg)  10/09/16 172 lb (78 kg)  09/28/16 174 lb 9 oz (79.2 kg)     Intake/Output Summary (Last 24 hours) at 10/13/16 1258 Last  data filed at 10/13/16 1112  Gross per 24 hour  Intake              480 ml  Output              250 ml  Net              230 ml    Physical Exam:   GENERAL: Pleasant-appearing in no apparent distress.  HEAD, EYES, EARS, NOSE AND THROAT: Atraumatic, normocephalic. Extraocular muscles are intact. Pupils equal and reactive to light. Sclerae anicteric. No conjunctival injection. No oro-pharyngeal erythema.  NECK: Supple.  There is no jugular venous distention. No bruits, no lymphadenopathy, no thyromegaly.  HEART: Regular rate and rhythm,. No murmurs, no rubs, no clicks.  LUNGS: Wheezing and rhonchus breath sounds on the right lung, left lung occasional wheezing  ABDOMEN: Soft, flat, nontender, nondistended. Has good bowel sounds. No hepatosplenomegaly appreciated.  EXTREMITIES: No evidence of any cyanosis, clubbing, or peripheral edema.  +2 pedal and radial pulses bilaterally.  NEUROLOGIC: The patient is alert, awake, and oriented x3 with no focal motor or sensory deficits appreciated bilaterally.  SKIN: Moist and warm with no rashes appreciated.  Psych: Not anxious, depressed LN: No inguinal LN enlargement    Antibiotics   Anti-infectives    Start     Dose/Rate Route Frequency Ordered Stop   10/12/16 1230  azithromycin (ZITHROMAX) tablet 500 mg     500 mg Oral Daily 10/12/16 1133        Medications   Scheduled Meds: . azithromycin  500 mg Oral Daily  . buPROPion  100 mg Oral BH-q7a  . clonazePAM  1 mg Oral BID  . diltiazem  240 mg Oral Daily  . DULoxetine  60 mg Oral Daily  . enoxaparin (LOVENOX) injection  40 mg Subcutaneous Q24H  . feeding supplement (ENSURE ENLIVE)  237 mL Oral TID BM  . furosemide  40 mg Oral Daily  . guaiFENesin  600 mg Oral BID  . insulin aspart  0-15 Units Subcutaneous TID WC  . insulin aspart  0-5 Units Subcutaneous QHS  . lidocaine  1-3 patch Transdermal Q24H  . magnesium oxide  400 mg Oral Daily  . mouth rinse  15 mL Mouth Rinse BID  . megestrol   200 mg Oral Daily  . mirtazapine  45 mg Oral QHS  . mometasone-formoterol  2 puff Inhalation BID  . nicotine  21 mg Transdermal Daily  . oxyCODONE  10 mg Oral QID  . oxyCODONE  20 mg Oral Q12H  . potassium chloride SA  20 mEq Oral BID  . predniSONE  60 mg Oral Q breakfast  . roflumilast  500 mcg Oral Daily  . sodium chloride flush  3 mL Intravenous Q12H   Continuous Infusions: . sodium chloride     PRN Meds:.sodium chloride, acetaminophen, albuterol, docusate sodium, gadobenate dimeglumine, guaifenesin, HYDROmorphone (DILAUDID) injection, ipratropium, ipratropium-albuterol, ondansetron, polyethylene glycol, senna, sodium chloride flush   Data Review:   Micro Results No results found for this or any previous visit (from the past 240 hour(s)).  Radiology Reports Ct Thoracic Spine Wo Contrast  Result Date: 10/12/2016 CLINICAL DATA:  Acute onset of mid back pain while seated today. No acute injury. Lung cancer metastatic to bone. EXAM: CT THORACIC AND LUMBAR SPINE WITHOUT CONTRAST TECHNIQUE: Multidetector CT imaging of the thoracic and lumbar spine was performed without contrast. Multiplanar CT image reconstructions were also generated. COMPARISON:  Abdominopelvic CT 10/11/2016. PET-CT 09/23/2016. Chest CT 08/06/2016. FINDINGS: CT THORACIC SPINE FINDINGS Alignment: Normal. Vertebrae: Known lytic metastasis involving the left T10 pedicle, transverse process and lamina is grossly stable, measuring up to 2.6 x 1.8 cm on image 120. In correlation with prior PET-CT, there are probable metastases involving the T7 spinous process and left eighth rib near the costovertebral junction. No definite new lesions. No evidence of pathologic fracture or epidural tumor. Paraspinal and other soft tissues: No paraspinal masses are seen. Right infrahilar mass, patchy right basilar airspace disease and a right pleural effusion appears similar to recent abdominal CT. Disc levels: Mild degenerative changes throughout  the spine  with mild endplate degeneration. No large disc herniation, spinal stenosis or nerve root encroachment seen. CT LUMBAR SPINE FINDINGS Segmentation: Normal. Alignment: Normal. Vertebrae: No lytic or enlarging blastic lesions are seen within the lumbar spine. There is a stable small sclerotic lesion in the right aspect of the L4 vertebral body, likely benign. There is a large lytic lesion within the left iliac bone abutting the sacroiliac joint, incompletely visualized, although grossly stable from prior studies. No evidence of pathologic fracture. Paraspinal and other soft tissues: No paraspinal masses are seen. Enlarged right adrenal gland again noted, consistent with a metastasis. There is aortic and branch vessel atherosclerosis. Disc levels: No significant disc space findings are seen within the lumbar spine at or above L3-4. L4-5: Loss of disc height with annular disc bulging and a probable left paracentral disc extrusion. There is inferior migration of disc material, mass effect on the thecal sac and probable left L5 nerve root encroachment. There is no foraminal compromise or L4 nerve root encroachment. L5-S1:  Normal interspace. IMPRESSION: 1. Lytic lesions involving the T7 spinous process, left eighth rib, left aspect of the T10 posterior elements and left iliac bone are similar to the recent prior studies and consistent with metastatic lung cancer. No enlarging lesions, pathologic fracture or epidural tumor seen. 2. Disc extrusion on the left at L4-5 may be symptomatic on the basis of left L5 nerve root encroachment in the canal. 3. Grossly stable findings of metastatic lung cancer in the right hemithorax and right adrenal metastasis. Electronically Signed   By: Richardean Sale M.D.   On: 10/12/2016 15:03   Ct Lumbar Spine Wo Contrast  Result Date: 10/12/2016 CLINICAL DATA:  Acute onset of mid back pain while seated today. No acute injury. Lung cancer metastatic to bone. EXAM: CT THORACIC AND  LUMBAR SPINE WITHOUT CONTRAST TECHNIQUE: Multidetector CT imaging of the thoracic and lumbar spine was performed without contrast. Multiplanar CT image reconstructions were also generated. COMPARISON:  Abdominopelvic CT 10/11/2016. PET-CT 09/23/2016. Chest CT 08/06/2016. FINDINGS: CT THORACIC SPINE FINDINGS Alignment: Normal. Vertebrae: Known lytic metastasis involving the left T10 pedicle, transverse process and lamina is grossly stable, measuring up to 2.6 x 1.8 cm on image 120. In correlation with prior PET-CT, there are probable metastases involving the T7 spinous process and left eighth rib near the costovertebral junction. No definite new lesions. No evidence of pathologic fracture or epidural tumor. Paraspinal and other soft tissues: No paraspinal masses are seen. Right infrahilar mass, patchy right basilar airspace disease and a right pleural effusion appears similar to recent abdominal CT. Disc levels: Mild degenerative changes throughout the spine with mild endplate degeneration. No large disc herniation, spinal stenosis or nerve root encroachment seen. CT LUMBAR SPINE FINDINGS Segmentation: Normal. Alignment: Normal. Vertebrae: No lytic or enlarging blastic lesions are seen within the lumbar spine. There is a stable small sclerotic lesion in the right aspect of the L4 vertebral body, likely benign. There is a large lytic lesion within the left iliac bone abutting the sacroiliac joint, incompletely visualized, although grossly stable from prior studies. No evidence of pathologic fracture. Paraspinal and other soft tissues: No paraspinal masses are seen. Enlarged right adrenal gland again noted, consistent with a metastasis. There is aortic and branch vessel atherosclerosis. Disc levels: No significant disc space findings are seen within the lumbar spine at or above L3-4. L4-5: Loss of disc height with annular disc bulging and a probable left paracentral disc extrusion. There is inferior migration of disc  material, mass  effect on the thecal sac and probable left L5 nerve root encroachment. There is no foraminal compromise or L4 nerve root encroachment. L5-S1:  Normal interspace. IMPRESSION: 1. Lytic lesions involving the T7 spinous process, left eighth rib, left aspect of the T10 posterior elements and left iliac bone are similar to the recent prior studies and consistent with metastatic lung cancer. No enlarging lesions, pathologic fracture or epidural tumor seen. 2. Disc extrusion on the left at L4-5 may be symptomatic on the basis of left L5 nerve root encroachment in the canal. 3. Grossly stable findings of metastatic lung cancer in the right hemithorax and right adrenal metastasis. Electronically Signed   By: Richardean Sale M.D.   On: 10/12/2016 15:03   Nm Pet Image Initial (pi) Skull Base To Thigh  Result Date: 09/23/2016 CLINICAL DATA:  Subsequent treatment strategy for lung carcinoma. EXAM: NUCLEAR MEDICINE PET SKULL BASE TO THIGH TECHNIQUE: 13.4 mCi F-18 FDG was injected intravenously. Full-ring PET imaging was performed from the skull base to thigh after the radiotracer. CT data was obtained and used for attenuation correction and anatomic localization. The PET data acquisition was terminated after completing a skullbase to iliac crest data acquisition. Patient was extremely uncomfortable could not tolerate lying supine. FASTING BLOOD GLUCOSE:  Value: 119 mg/dl COMPARISON:  CT 08/06/2016, PET-CT 1198 FINDINGS: NECK No hypermetabolic lymph nodes in the neck. CHEST RIGHT lower lobe mass is similar in size and metabolic activity measuring 2.7 cm with SUV max equal 7.3 compared to 11.6 on prior. However, there are new nodules in the RIGHT lower lobe compared to most recent CT scan and PET-CT scan which are hypermetabolic. Additionally there is a new hypermetabolic nodule within the LEFT lung upper lobe measuring 10 mm (image 96, series 3) with SUV max equal 4.1. Second lingular nodule is hypermetabolic on  image 144. There is new hypermetabolic mediastinal lymphadenopathy. For example subcarinal lymph node with SUV max equals 6.0. RIGHT paratracheal nodes which are hypermetabolic. RIGHT hilar lymph node is newly enlarged and hypermetabolic. Prevascular lymph nodes are hypermetabolic with SUV max equal 3.9. ABDOMEN/PELVIS There is new enlargement of the RIGHT adrenal gland which is hypermetabolic with SUV max equals 7.1. The more inferior abdomen and pelvis is not imaged by PET imaging.imaging. Defect within the RIGHT iliac wing is favored related to prior bone harvesting. SKELETON New hypermetabolic lesion in the Y18 pedicle on the LEFT (image 142, series 3) with SUV max equal 6.5. New rib lesion in T8 on the LEFT adjacent to the transverse process with SUV max equal 4.6. IMPRESSION: 1. Lung cancer progression with new hypermetabolic RIGHT lower lobe and LEFT upper lobe pulmonary nodules. 2. New hypermetabolic mediastinal lymph nodes consistent metastasis. 3. Hypermetabolic RIGHT adrenal gland metastasis. 4. New skeletal metastasis to the T10 vertebral body and LEFT rib. T 5. Inferior abdomen and pelvis not imaged by PET due to patient discomfort. Electronically Signed   By: Suzy Bouchard M.D.   On: 09/23/2016 13:51   Ct Renal Stone Study  Result Date: 10/11/2016 CLINICAL DATA:  Acute left flank pain.  Known lung cancer. EXAM: CT ABDOMEN AND PELVIS WITHOUT CONTRAST TECHNIQUE: Multidetector CT imaging of the abdomen and pelvis was performed following the standard protocol without IV contrast. COMPARISON:  PET-CT 09/23/2016 FINDINGS: Lower chest: Pulmonary masses are seen within the right middle lobe, right lower lobe and left lower lobe, consistent with known metastatic lung cancer. There is a small right pleural effusion. Small pericardial effusion. Hepatobiliary: No focal liver abnormality  is seen. No gallstones, gallbladder wall thickening, or biliary dilatation. Pancreas: Unremarkable. No pancreatic ductal  dilatation or surrounding inflammatory changes. Spleen: Normal in size without focal abnormality. Adrenals/Urinary Tract: Known metastatic right adrenal mass measures 18 mm in cross-section. No evidence of nephrolithiasis, hydronephrosis or obstructive uropathy. The urinary bladder is normal, although partially obscured by beam hardening artifact from left hip prosthesis. Stomach/Bowel: Stomach is within normal limits. Appendix appears normal. No evidence of bowel wall thickening, distention, or inflammatory changes. Vascular/Lymphatic: Calcific atherosclerotic disease of the abdominal aorta. Numerous mildly enlarged retroperitoneal lymph nodes centered about the takeoff of the renal arteries. Reproductive: Prostate is unremarkable. Other: No abdominal wall hernia or abnormality. No abdominopelvic ascites. Musculoskeletal: 10 mm sclerotic focus within the superior aspect of L4 vertebral body. The previously demonstrated by PET-CT metastatic deposit within T10 vertebral body corresponds to a lytic lesion within the left pedicle, axial image 6/91 sequence 2 and sagittal image 101/165, sequence 6. There is a small soft tissue component to this lesion. Large permeative lytic lesion within the left ilium, bordering the SI joint measures 5.3 cm. IMPRESSION: No evidence of obstructive uropathy or nephrolithiasis. Pulmonary masses within the right middle lobe, right lower lobe, left lower lobe, consistent with known metastatic lung cancer. Small to moderate right pleural effusion. Small pericardial effusion. Known metastatic right adrenal mass. Numerous mildly enlarged retroperitoneal upper abdominal lymph nodes, likely metastatic. T10 left pedicle metastatic lytic lesion. Given its proximity to the osseous neural foramen, it may have caused patient's symptoms. Large permeative lytic lesion within the left ilium causing cortical destruction, probably also metastatic. Electronically Signed   By: Fidela Salisbury M.D.    On: 10/11/2016 21:11     CBC  Recent Labs Lab 10/09/16 1001 10/11/16 2032 10/12/16 0346  WBC 8.6 6.1 5.3  HGB 10.9* 10.6* 10.2*  HCT 32.2* 31.6* 30.5*  PLT 356 355 316  MCV 83.0 84.3 84.4  MCH 28.0 28.3 28.1  MCHC 33.7 33.5 33.3  RDW 14.6* 14.4 14.5  LYMPHSABS 0.5*  --   --   MONOABS 1.1*  --   --   EOSABS 0.0  --   --   BASOSABS 0.0  --   --     Chemistries   Recent Labs Lab 10/09/16 1001 10/11/16 2032 10/12/16 0346  NA 131* 137 138  K 4.1 4.1 4.1  CL 94* 100* 105  CO2 25 29 28   GLUCOSE 207* 194* 121*  BUN 13 14 10   CREATININE 0.85 0.72 0.59*  CALCIUM 9.2 8.3* 8.1*  AST 36 38  --   ALT 27 35  --   ALKPHOS 74 74  --   BILITOT 0.6 0.2*  --    ------------------------------------------------------------------------------------------------------------------ estimated creatinine clearance is 115 mL/min (A) (by C-G formula based on SCr of 0.59 mg/dL (L)). ------------------------------------------------------------------------------------------------------------------ No results for input(s): HGBA1C in the last 72 hours. ------------------------------------------------------------------------------------------------------------------ No results for input(s): CHOL, HDL, LDLCALC, TRIG, CHOLHDL, LDLDIRECT in the last 72 hours. ------------------------------------------------------------------------------------------------------------------ No results for input(s): TSH, T4TOTAL, T3FREE, THYROIDAB in the last 72 hours.  Invalid input(s): FREET3 ------------------------------------------------------------------------------------------------------------------ No results for input(s): VITAMINB12, FOLATE, FERRITIN, TIBC, IRON, RETICCTPCT in the last 72 hours.  Coagulation profile No results for input(s): INR, PROTIME in the last 168 hours.  No results for input(s): DDIMER in the last 72 hours.  Cardiac Enzymes No results for input(s): CKMB, TROPONINI, MYOGLOBIN in the  last 168 hours.  Invalid input(s): CK ------------------------------------------------------------------------------------------------------------------ Invalid input(s): [place]  This is a 51 y.o. 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 Intractable left-sided back pain likely related to lytic lesion at T10 as well as cancer pain from bony metastases -Seen by neurosurgery they're recommending MRI of the lumbar spine and thoracic spine  CT of the lumbar spine done-  They've recommended kyphoplasty based on MRI findings  #Acute on chronic COPD exasperation as well as acute bronchi Continue nebulizer therapy I will add prednisone and azithromycin  #. Lung cancer with metastatic disease Prognosis poor  #. History of hypertension and atrial fibrillation -Continue Cardizem  #. History of depression - Continue Wellbutrin, Cymbalta  #. History of diabetes - Continue regular insulin sliding scale coverage   #. Tobacco Use disorder - Smoking cessation provided 86min spent strongly recommend he stop smoking nicotine patch will this given  #DO NOT RESUSCITATE status     Code Status Orders        Start     Ordered   10/12/16 0134  Full code  Continuous     10/12/16 0133    Code Status History    Date Active Date Inactive Code Status Order ID Comments User Context   10/12/2016  1:18 AM 10/12/2016  1:33 AM DNR [id number]  Harvie Bridge, DO ED   01/23/2016 10:03 PM 01/26/2016  2:54 PM DNR [id number]  Fritzi Mandes, MD Inpatient   12/09/2015 11:40 AM 12/12/2015  4:07 PM Full Code [id number]  Epifanio Lesches, MD ED   10/24/2015  1:10 PM 10/27/2015  2:10 PM DNR [id number]  Gladstone Lighter, MD ED   10/20/2015 10:51 AM 10/23/2015  4:37 PM DNR [id number]  Laverle Hobby, MD Inpatient    10/19/2015  8:55 AM 10/20/2015 10:51 AM Full Code [id number]  Dustin Flock, MD Inpatient   10/18/2015 12:29 PM 10/19/2015  8:55 AM DNR [id number]  Laverle Hobby, MD Inpatient   10/18/2015  8:53 AM 10/18/2015 12:29 PM Full Code [id number]  Loletha Grayer, MD Inpatient   10/15/2015  4:43 PM 10/18/2015  8:53 AM DNR [id number]  Lytle Butte, MD ED   10/15/2015  4:43 PM 10/15/2015  4:43 PM Full Code [id number]  Hower, Aaron Mose, MD ED   06/26/2015 10:13 AM 06/28/2015  6:05 PM DNR [id number]  Bettey Costa, MD Inpatient   06/26/2015  6:05 AM 06/26/2015 10:13 AM Full Code [id number]  Saundra Shelling, MD Inpatient   06/08/2015 12:15 AM 06/10/2015  4:32 PM Full Code [id number]  Saundra Shelling, MD Inpatient    Advance Directive Documentation     Most Recent Value  Type of Advance Directive  Healthcare Power of [place], Living will  Pre-existing out of facility DNR order (yellow form or pink MOST form)  -  "MOST" Form in Place?  -           Consults  neurosurgery  DVT Prophylaxis  Lovenox    Lab Results  Component Value Date   PLT 316 10/12/2016     Time Spent in minutes   40min  Greater than 50% of time spent in care coordination and counseling patient regarding the condition and plan of care.   Dustin Flock M.D on 10/13/2016 at 12:58 PM  Between 7am to 6pm - Pager - [phone]  After 6pm go to [email or web address] - password [email or web address] [place] [facility]   Office  [phone]

## 2016-10-14 DIAGNOSIS — M549 Dorsalgia, unspecified: Secondary | ICD-10-CM | POA: Diagnosis present

## 2016-10-14 DIAGNOSIS — C3431 Malignant neoplasm of lower lobe, right bronchus or lung: Secondary | ICD-10-CM | POA: Diagnosis present

## 2016-10-14 DIAGNOSIS — J209 Acute bronchitis, unspecified: Secondary | ICD-10-CM | POA: Diagnosis present

## 2016-10-14 DIAGNOSIS — B182 Chronic viral hepatitis C: Secondary | ICD-10-CM | POA: Diagnosis present

## 2016-10-14 DIAGNOSIS — I2510 Atherosclerotic heart disease of native coronary artery without angina pectoris: Secondary | ICD-10-CM | POA: Diagnosis present

## 2016-10-14 DIAGNOSIS — E1165 Type 2 diabetes mellitus with hyperglycemia: Secondary | ICD-10-CM | POA: Diagnosis present

## 2016-10-14 DIAGNOSIS — F419 Anxiety disorder, unspecified: Secondary | ICD-10-CM | POA: Diagnosis present

## 2016-10-14 DIAGNOSIS — T380X5A Adverse effect of glucocorticoids and synthetic analogues, initial encounter: Secondary | ICD-10-CM | POA: Diagnosis present

## 2016-10-14 DIAGNOSIS — G893 Neoplasm related pain (acute) (chronic): Secondary | ICD-10-CM | POA: Diagnosis not present

## 2016-10-14 DIAGNOSIS — F17210 Nicotine dependence, cigarettes, uncomplicated: Secondary | ICD-10-CM | POA: Diagnosis present

## 2016-10-14 DIAGNOSIS — Z7189 Other specified counseling: Secondary | ICD-10-CM | POA: Diagnosis not present

## 2016-10-14 DIAGNOSIS — C3490 Malignant neoplasm of unspecified part of unspecified bronchus or lung: Secondary | ICD-10-CM | POA: Diagnosis not present

## 2016-10-14 DIAGNOSIS — Z9981 Dependence on supplemental oxygen: Secondary | ICD-10-CM | POA: Diagnosis not present

## 2016-10-14 DIAGNOSIS — Z515 Encounter for palliative care: Secondary | ICD-10-CM | POA: Diagnosis not present

## 2016-10-14 DIAGNOSIS — J9622 Acute and chronic respiratory failure with hypercapnia: Secondary | ICD-10-CM | POA: Diagnosis present

## 2016-10-14 DIAGNOSIS — K5900 Constipation, unspecified: Secondary | ICD-10-CM | POA: Diagnosis present

## 2016-10-14 DIAGNOSIS — I4891 Unspecified atrial fibrillation: Secondary | ICD-10-CM | POA: Diagnosis present

## 2016-10-14 DIAGNOSIS — C3412 Malignant neoplasm of upper lobe, left bronchus or lung: Secondary | ICD-10-CM | POA: Diagnosis present

## 2016-10-14 DIAGNOSIS — Z66 Do not resuscitate: Secondary | ICD-10-CM | POA: Diagnosis present

## 2016-10-14 DIAGNOSIS — F329 Major depressive disorder, single episode, unspecified: Secondary | ICD-10-CM | POA: Diagnosis present

## 2016-10-14 DIAGNOSIS — R531 Weakness: Secondary | ICD-10-CM | POA: Diagnosis not present

## 2016-10-14 DIAGNOSIS — C7951 Secondary malignant neoplasm of bone: Secondary | ICD-10-CM | POA: Diagnosis present

## 2016-10-14 DIAGNOSIS — J9621 Acute and chronic respiratory failure with hypoxia: Secondary | ICD-10-CM | POA: Diagnosis present

## 2016-10-14 DIAGNOSIS — Z96642 Presence of left artificial hip joint: Secondary | ICD-10-CM | POA: Diagnosis present

## 2016-10-14 DIAGNOSIS — J440 Chronic obstructive pulmonary disease with acute lower respiratory infection: Secondary | ICD-10-CM | POA: Diagnosis present

## 2016-10-14 DIAGNOSIS — J441 Chronic obstructive pulmonary disease with (acute) exacerbation: Secondary | ICD-10-CM | POA: Diagnosis present

## 2016-10-14 DIAGNOSIS — I10 Essential (primary) hypertension: Secondary | ICD-10-CM | POA: Diagnosis present

## 2016-10-14 LAB — GLUCOSE, CAPILLARY
GLUCOSE-CAPILLARY: 316 mg/dL — AB (ref 65–99)
Glucose-Capillary: 116 mg/dL — ABNORMAL HIGH (ref 65–99)
Glucose-Capillary: 249 mg/dL — ABNORMAL HIGH (ref 65–99)
Glucose-Capillary: 162 mg/dL — ABNORMAL HIGH (ref 65–99)

## 2016-10-14 MED ORDER — FENTANYL 25 MCG/HR TD PT72
25.0000 ug | MEDICATED_PATCH | Freq: Once | TRANSDERMAL | Status: DC
  Administered 2016-10-14: 25 ug via TRANSDERMAL
  Filled 2016-10-14: qty 1

## 2016-10-14 MED ORDER — MORPHINE SULFATE (PF) 2 MG/ML IV SOLN
2.0000 mg | Freq: Once | INTRAVENOUS | Status: AC
  Administered 2016-10-14: 05:00:00 2 mg via INTRAVENOUS
  Filled 2016-10-14: qty 1

## 2016-10-14 MED ORDER — OXYCODONE HCL ER 40 MG PO T12A
40.0000 mg | EXTENDED_RELEASE_TABLET | Freq: Two times a day (BID) | ORAL | Status: DC
  Administered 2016-10-14: 40 mg via ORAL
  Filled 2016-10-14: qty 1

## 2016-10-14 MED ORDER — FENTANYL 25 MCG/HR TD PT72
25.0000 ug | MEDICATED_PATCH | TRANSDERMAL | Status: DC
  Administered 2016-10-14: 14:00:00 25 ug via TRANSDERMAL
  Filled 2016-10-14: qty 1

## 2016-10-14 MED ORDER — FENTANYL 50 MCG/HR TD PT72: 50.0000 ug | MEDICATED_PATCH | TRANSDERMAL | Status: DC

## 2016-10-14 MED ORDER — OXYCODONE HCL 5 MG PO TABS
10.0000 mg | ORAL_TABLET | ORAL | Status: DC | PRN
  Administered 2016-10-14 – 2016-10-21 (×33): 10 mg via ORAL
  Filled 2016-10-14 (×33): qty 2

## 2016-10-14 MED ORDER — METFORMIN HCL 500 MG PO TABS
500.0000 mg | ORAL_TABLET | Freq: Two times a day (BID) | ORAL | Status: DC
  Administered 2016-10-14 – 2016-10-21 (×14): 500 mg via ORAL
  Filled 2016-10-14 (×15): qty 1

## 2016-10-14 MED ORDER — ACETAMINOPHEN 325 MG PO TABS: 650.0000 mg | ORAL_TABLET | ORAL | Status: DC | PRN

## 2016-10-14 NOTE — Progress Notes (Signed)
Pnt medicated at this time for pain 9/10 per orders. Offered to change pnt linen as there is a strong urine smell, pnt declined at this time stating "I just want to rest". No other concerns at this time will continue to monitor.

## 2016-10-14 NOTE — Progress Notes (Signed)
Advance care planning  With the patient regarding his lung cancer with metastatic cyst, chronic pain syndrome. Patient mentions that he has come through a lot including chemotherapy with no improvement. He has decided to stop his chemotherapy at this time. Once his pain controlled. Also is worried about his discharge disposition back to group home which she thinks is not equipped to care for him. We discussed regarding hospice services and his opening to having hospice following after discharge. Understands the poor prognosis due to metastatic cyst now that his decided to stop chemotherapy. Advised palliative care evaluation and patient agrees. Consult ordered. Patient is DO NOT RESUSCITATE. Discussed regarding the pain controlled and we'll start him on a fentanyl patch and oxycodone when necessary.  Time spent 20 minutes

## 2016-10-14 NOTE — Progress Notes (Signed)
[place] at [facility]                                                                                                                                                                                  Patient Demographics   Mykai Wendorf, is a 51 y.o. male, DOB - 1965-02-26, UXN:[id number]  Admit date - 10/11/2016   Admitting Physician Harvie Bridge, DO  Outpatient Primary MD for the patient is Marygrace Drought, MD   LOS - 0  Subjective: Still complains of shortness of breath and pain all over Breathing is back to normal.On 5 L oxygen at group home   Review of Systems:   CONSTITUTIONAL: No documented fever. No fatigue, weakness. No weight gain, no weight loss.  EYES: No blurry or double vision.  ENT: No tinnitus. No postnasal drip. No redness of the oropharynx.  RESPIRATORY:Positive cough, positive wheeze, no hemoptysis. Positive dyspnea.  CARDIOVASCULAR: No chest pain. No orthopnea. No palpitations. No syncope.  GASTROINTESTINAL: No nausea, no vomiting or diarrhea. No abdominal pain. No melena or hematochezia.  GENITOURINARY: No dysuria or hematuria.  ENDOCRINE: No polyuria or nocturia. No heat or cold intolerance.  HEMATOLOGY: No anemia. No bruising. No bleeding.  INTEGUMENTARY: No rashes. No lesions.  MUSCULOSKELETAL: No arthritis. No swelling. No gout. Pain all over NEUROLOGIC: No numbness, tingling, or ataxia. No seizure-type activity.  PSYCHIATRIC: No anxiety. No insomnia. No ADD.    Vitals:   Vitals:   10/13/16 0508 10/13/16 1922 10/13/16 2057 10/14/16 0416  BP: 132/79 118/65  128/66  Pulse: (!) 123 (!) 115  (!) 123  Resp: 18 20  18   Temp: 97.7 F (36.5 C) 98 F (36.7 C)  97.8 F (36.6 C)  TempSrc: Oral   Oral  SpO2: 93% 98% 96% 93%  Weight:      Height:        Wt Readings from Last 3 Encounters:  10/11/16 77.1 kg (170 lb)  10/09/16 78 kg (172 lb)  09/28/16 79.2 kg (174 lb 9 oz)     Intake/Output Summary (Last 24 hours)  at 10/14/16 1314 Last data filed at 10/14/16 0650  Gross per 24 hour  Intake                0 ml  Output             1075 ml  Net            -1075 ml    Physical Exam:   GENERAL: Pleasant-appearing in no apparent distress.  HEAD, EYES, EARS, NOSE AND THROAT: Atraumatic, normocephalic. Extraocular muscles are intact. Pupils equal and reactive to light. Sclerae anicteric. No conjunctival  injection. No oro-pharyngeal erythema.  NECK: Supple. There is no jugular venous distention. No bruits, no lymphadenopathy, no thyromegaly.  HEART: Regular rate and rhythm,. No murmurs, no rubs, no clicks.  LUNGS: Wheezing and rhonchus breath sounds on the right lung, left lung occasional wheezing  ABDOMEN: Soft, flat, nontender, nondistended. Has good bowel sounds. No hepatosplenomegaly appreciated.  EXTREMITIES: No evidence of any cyanosis, clubbing, or peripheral edema.  +2 pedal and radial pulses bilaterally.  NEUROLOGIC: The patient is alert, awake, and oriented x3 with no focal motor or sensory deficits appreciated bilaterally.  SKIN: Moist and warm with no rashes appreciated.  Psych: Not anxious, depressed LN: No inguinal LN enlargement    Antibiotics   Anti-infectives    Start     Dose/Rate Route Frequency Ordered Stop   10/12/16 1230  azithromycin (ZITHROMAX) tablet 500 mg     500 mg Oral Daily 10/12/16 1133 10/14/16 1002      Medications   Scheduled Meds: . buPROPion  100 mg Oral BH-q7a  . clonazePAM  1 mg Oral BID  . diltiazem  300 mg Oral Daily  . DULoxetine  60 mg Oral Daily  . enoxaparin (LOVENOX) injection  40 mg Subcutaneous Q24H  . feeding supplement (ENSURE ENLIVE)  237 mL Oral TID BM  . fentaNYL  25 mcg Transdermal Q72H  . furosemide  40 mg Oral Daily  . guaiFENesin  600 mg Oral BID  . insulin aspart  0-15 Units Subcutaneous TID WC  . insulin aspart  0-5 Units Subcutaneous QHS  . lidocaine  1-3 patch Transdermal Q24H  . magnesium oxide  400 mg Oral Daily  . mouth rinse   15 mL Mouth Rinse BID  . megestrol  200 mg Oral Daily  . mirtazapine  45 mg Oral QHS  . mometasone-formoterol  2 puff Inhalation BID  . nicotine  21 mg Transdermal Daily  . potassium chloride SA  20 mEq Oral BID  . predniSONE  60 mg Oral Q breakfast  . roflumilast  500 mcg Oral Daily  . senna-docusate  1 tablet Oral BID  . sodium chloride flush  3 mL Intravenous Q12H   Continuous Infusions: . sodium chloride     PRN Meds:.sodium chloride, acetaminophen, albuterol, docusate sodium, gadobenate dimeglumine, guaifenesin, HYDROmorphone (DILAUDID) injection, ipratropium, ipratropium-albuterol, ondansetron, oxyCODONE, polyethylene glycol, senna, sodium chloride flush, traMADol   Data Review:   Micro Results No results found for this or any previous visit (from the past 240 hour(s)).  Radiology Reports Ct Thoracic Spine Wo Contrast  Result Date: 10/12/2016 CLINICAL DATA:  Acute onset of mid back pain while seated today. No acute injury. Lung cancer metastatic to bone. EXAM: CT THORACIC AND LUMBAR SPINE WITHOUT CONTRAST TECHNIQUE: Multidetector CT imaging of the thoracic and lumbar spine was performed without contrast. Multiplanar CT image reconstructions were also generated. COMPARISON:  Abdominopelvic CT 10/11/2016. PET-CT 09/23/2016. Chest CT 08/06/2016. FINDINGS: CT THORACIC SPINE FINDINGS Alignment: Normal. Vertebrae: Known lytic metastasis involving the left T10 pedicle, transverse process and lamina is grossly stable, measuring up to 2.6 x 1.8 cm on image 120. In correlation with prior PET-CT, there are probable metastases involving the T7 spinous process and left eighth rib near the costovertebral junction. No definite new lesions. No evidence of pathologic fracture or epidural tumor. Paraspinal and other soft tissues: No paraspinal masses are seen. Right infrahilar mass, patchy right basilar airspace disease and a right pleural effusion appears similar to recent abdominal CT. Disc levels:  Mild degenerative changes throughout the  spine with mild endplate degeneration. No large disc herniation, spinal stenosis or nerve root encroachment seen. CT LUMBAR SPINE FINDINGS Segmentation: Normal. Alignment: Normal. Vertebrae: No lytic or enlarging blastic lesions are seen within the lumbar spine. There is a stable small sclerotic lesion in the right aspect of the L4 vertebral body, likely benign. There is a large lytic lesion within the left iliac bone abutting the sacroiliac joint, incompletely visualized, although grossly stable from prior studies. No evidence of pathologic fracture. Paraspinal and other soft tissues: No paraspinal masses are seen. Enlarged right adrenal gland again noted, consistent with a metastasis. There is aortic and branch vessel atherosclerosis. Disc levels: No significant disc space findings are seen within the lumbar spine at or above L3-4. L4-5: Loss of disc height with annular disc bulging and a probable left paracentral disc extrusion. There is inferior migration of disc material, mass effect on the thecal sac and probable left L5 nerve root encroachment. There is no foraminal compromise or L4 nerve root encroachment. L5-S1:  Normal interspace. IMPRESSION: 1. Lytic lesions involving the T7 spinous process, left eighth rib, left aspect of the T10 posterior elements and left iliac bone are similar to the recent prior studies and consistent with metastatic lung cancer. No enlarging lesions, pathologic fracture or epidural tumor seen. 2. Disc extrusion on the left at L4-5 may be symptomatic on the basis of left L5 nerve root encroachment in the canal. 3. Grossly stable findings of metastatic lung cancer in the right hemithorax and right adrenal metastasis. Electronically Signed   By: Richardean Sale M.D.   On: 10/12/2016 15:03   Ct Lumbar Spine Wo Contrast  Result Date: 10/12/2016 CLINICAL DATA:  Acute onset of mid back pain while seated today. No acute injury. Lung cancer  metastatic to bone. EXAM: CT THORACIC AND LUMBAR SPINE WITHOUT CONTRAST TECHNIQUE: Multidetector CT imaging of the thoracic and lumbar spine was performed without contrast. Multiplanar CT image reconstructions were also generated. COMPARISON:  Abdominopelvic CT 10/11/2016. PET-CT 09/23/2016. Chest CT 08/06/2016. FINDINGS: CT THORACIC SPINE FINDINGS Alignment: Normal. Vertebrae: Known lytic metastasis involving the left T10 pedicle, transverse process and lamina is grossly stable, measuring up to 2.6 x 1.8 cm on image 120. In correlation with prior PET-CT, there are probable metastases involving the T7 spinous process and left eighth rib near the costovertebral junction. No definite new lesions. No evidence of pathologic fracture or epidural tumor. Paraspinal and other soft tissues: No paraspinal masses are seen. Right infrahilar mass, patchy right basilar airspace disease and a right pleural effusion appears similar to recent abdominal CT. Disc levels: Mild degenerative changes throughout the spine with mild endplate degeneration. No large disc herniation, spinal stenosis or nerve root encroachment seen. CT LUMBAR SPINE FINDINGS Segmentation: Normal. Alignment: Normal. Vertebrae: No lytic or enlarging blastic lesions are seen within the lumbar spine. There is a stable small sclerotic lesion in the right aspect of the L4 vertebral body, likely benign. There is a large lytic lesion within the left iliac bone abutting the sacroiliac joint, incompletely visualized, although grossly stable from prior studies. No evidence of pathologic fracture. Paraspinal and other soft tissues: No paraspinal masses are seen. Enlarged right adrenal gland again noted, consistent with a metastasis. There is aortic and branch vessel atherosclerosis. Disc levels: No significant disc space findings are seen within the lumbar spine at or above L3-4. L4-5: Loss of disc height with annular disc bulging and a probable left paracentral disc  extrusion. There is inferior migration of disc material,  mass effect on the thecal sac and probable left L5 nerve root encroachment. There is no foraminal compromise or L4 nerve root encroachment. L5-S1:  Normal interspace. IMPRESSION: 1. Lytic lesions involving the T7 spinous process, left eighth rib, left aspect of the T10 posterior elements and left iliac bone are similar to the recent prior studies and consistent with metastatic lung cancer. No enlarging lesions, pathologic fracture or epidural tumor seen. 2. Disc extrusion on the left at L4-5 may be symptomatic on the basis of left L5 nerve root encroachment in the canal. 3. Grossly stable findings of metastatic lung cancer in the right hemithorax and right adrenal metastasis. Electronically Signed   By: Richardean Sale M.D.   On: 10/12/2016 15:03   Nm Pet Image Initial (pi) Skull Base To Thigh  Result Date: 09/23/2016 CLINICAL DATA:  Subsequent treatment strategy for lung carcinoma. EXAM: NUCLEAR MEDICINE PET SKULL BASE TO THIGH TECHNIQUE: 13.4 mCi F-18 FDG was injected intravenously. Full-ring PET imaging was performed from the skull base to thigh after the radiotracer. CT data was obtained and used for attenuation correction and anatomic localization. The PET data acquisition was terminated after completing a skullbase to iliac crest data acquisition. Patient was extremely uncomfortable could not tolerate lying supine. FASTING BLOOD GLUCOSE:  Value: 119 mg/dl COMPARISON:  CT 08/06/2016, PET-CT 1198 FINDINGS: NECK No hypermetabolic lymph nodes in the neck. CHEST RIGHT lower lobe mass is similar in size and metabolic activity measuring 2.7 cm with SUV max equal 7.3 compared to 11.6 on prior. However, there are new nodules in the RIGHT lower lobe compared to most recent CT scan and PET-CT scan which are hypermetabolic. Additionally there is a new hypermetabolic nodule within the LEFT lung upper lobe measuring 10 mm (image 96, series 3) with SUV max equal  4.1. Second lingular nodule is hypermetabolic on image 322. There is new hypermetabolic mediastinal lymphadenopathy. For example subcarinal lymph node with SUV max equals 6.0. RIGHT paratracheal nodes which are hypermetabolic. RIGHT hilar lymph node is newly enlarged and hypermetabolic. Prevascular lymph nodes are hypermetabolic with SUV max equal 3.9. ABDOMEN/PELVIS There is new enlargement of the RIGHT adrenal gland which is hypermetabolic with SUV max equals 7.1. The more inferior abdomen and pelvis is not imaged by PET imaging.imaging. Defect within the RIGHT iliac wing is favored related to prior bone harvesting. SKELETON New hypermetabolic lesion in the G25 pedicle on the LEFT (image 142, series 3) with SUV max equal 6.5. New rib lesion in T8 on the LEFT adjacent to the transverse process with SUV max equal 4.6. IMPRESSION: 1. Lung cancer progression with new hypermetabolic RIGHT lower lobe and LEFT upper lobe pulmonary nodules. 2. New hypermetabolic mediastinal lymph nodes consistent metastasis. 3. Hypermetabolic RIGHT adrenal gland metastasis. 4. New skeletal metastasis to the T10 vertebral body and LEFT rib. T 5. Inferior abdomen and pelvis not imaged by PET due to patient discomfort. Electronically Signed   By: Suzy Bouchard M.D.   On: 09/23/2016 13:51   Ct Renal Stone Study  Result Date: 10/11/2016 CLINICAL DATA:  Acute left flank pain.  Known lung cancer. EXAM: CT ABDOMEN AND PELVIS WITHOUT CONTRAST TECHNIQUE: Multidetector CT imaging of the abdomen and pelvis was performed following the standard protocol without IV contrast. COMPARISON:  PET-CT 09/23/2016 FINDINGS: Lower chest: Pulmonary masses are seen within the right middle lobe, right lower lobe and left lower lobe, consistent with known metastatic lung cancer. There is a small right pleural effusion. Small pericardial effusion. Hepatobiliary: No focal liver  abnormality is seen. No gallstones, gallbladder wall thickening, or biliary  dilatation. Pancreas: Unremarkable. No pancreatic ductal dilatation or surrounding inflammatory changes. Spleen: Normal in size without focal abnormality. Adrenals/Urinary Tract: Known metastatic right adrenal mass measures 18 mm in cross-section. No evidence of nephrolithiasis, hydronephrosis or obstructive uropathy. The urinary bladder is normal, although partially obscured by beam hardening artifact from left hip prosthesis. Stomach/Bowel: Stomach is within normal limits. Appendix appears normal. No evidence of bowel wall thickening, distention, or inflammatory changes. Vascular/Lymphatic: Calcific atherosclerotic disease of the abdominal aorta. Numerous mildly enlarged retroperitoneal lymph nodes centered about the takeoff of the renal arteries. Reproductive: Prostate is unremarkable. Other: No abdominal wall hernia or abnormality. No abdominopelvic ascites. Musculoskeletal: 10 mm sclerotic focus within the superior aspect of L4 vertebral body. The previously demonstrated by PET-CT metastatic deposit within T10 vertebral body corresponds to a lytic lesion within the left pedicle, axial image 6/91 sequence 2 and sagittal image 101/165, sequence 6. There is a small soft tissue component to this lesion. Large permeative lytic lesion within the left ilium, bordering the SI joint measures 5.3 cm. IMPRESSION: No evidence of obstructive uropathy or nephrolithiasis. Pulmonary masses within the right middle lobe, right lower lobe, left lower lobe, consistent with known metastatic lung cancer. Small to moderate right pleural effusion. Small pericardial effusion. Known metastatic right adrenal mass. Numerous mildly enlarged retroperitoneal upper abdominal lymph nodes, likely metastatic. T10 left pedicle metastatic lytic lesion. Given its proximity to the osseous neural foramen, it may have caused patient's symptoms. Large permeative lytic lesion within the left ilium causing cortical destruction, probably also metastatic.  Electronically Signed   By: Fidela Salisbury M.D.   On: 10/11/2016 21:11     CBC  Recent Labs Lab 10/09/16 1001 10/11/16 2032 10/12/16 0346  WBC 8.6 6.1 5.3  HGB 10.9* 10.6* 10.2*  HCT 32.2* 31.6* 30.5*  PLT 356 355 316  MCV 83.0 84.3 84.4  MCH 28.0 28.3 28.1  MCHC 33.7 33.5 33.3  RDW 14.6* 14.4 14.5  LYMPHSABS 0.5*  --   --   MONOABS 1.1*  --   --   EOSABS 0.0  --   --   BASOSABS 0.0  --   --     Chemistries   Recent Labs Lab 10/09/16 1001 10/11/16 2032 10/12/16 0346  NA 131* 137 138  K 4.1 4.1 4.1  CL 94* 100* 105  CO2 25 29 28   GLUCOSE 207* 194* 121*  BUN 13 14 10   CREATININE 0.85 0.72 0.59*  CALCIUM 9.2 8.3* 8.1*  AST 36 38  --   ALT 27 35  --   ALKPHOS 74 74  --   BILITOT 0.6 0.2*  --    ------------------------------------------------------------------------------------------------------------------ estimated creatinine clearance is 115 mL/min (A) (by C-G formula based on SCr of 0.59 mg/dL (L)). ------------------------------------------------------------------------------------------------------------------ No results for input(s): HGBA1C in the last 72 hours. ------------------------------------------------------------------------------------------------------------------ No results for input(s): CHOL, HDL, LDLCALC, TRIG, CHOLHDL, LDLDIRECT in the last 72 hours. ------------------------------------------------------------------------------------------------------------------ No results for input(s): TSH, T4TOTAL, T3FREE, THYROIDAB in the last 72 hours.  Invalid input(s): FREET3 ------------------------------------------------------------------------------------------------------------------ No results for input(s): VITAMINB12, FOLATE, FERRITIN, TIBC, IRON, RETICCTPCT in the last 72 hours.  Coagulation profile No results for input(s): INR, PROTIME in the last 168 hours.  No results for input(s): DDIMER in the last 72 hours.  Cardiac Enzymes No  results for input(s): CKMB, TROPONINI, MYOGLOBIN in the last 168 hours.  Invalid input(s): CK ------------------------------------------------------------------------------------------------------------------ Invalid input(s): [place]  This is a 51 y.o. male with a history of atrial fibrillation, asthma, COPD, congestive heart failure, coronary artery disease, diabetes,chronic hep C hypertension, lung cancer with metastasis to the bone now being admitted with:  # Severe back pain due to lytic lesions of T7 and T10. Also involved are left eighth rib and left iliac bone from epistaxis of lung cancer. MRI of the thoracolumbar spine recommended by neurosurgery. Patient is refusing an MRI at this time and does not want any surgeries or kyphoplasty. We discussed regarding pain control. We will start fentanyl. He does not want any further chemotherapy. Inadequate pain control. Initially increased his OxyContin. No response. Change to fentanyl patch and oxycodone when necessary.  #Acute on chronic COPD exasperation as well as acute bronchitis Improved. Feels he is back to baseline.  #. Lung cancer with metastatic disease Prognosis poor  #. History of hypertension and atrial fibrillation -Continue Cardizem  #. History of depression - Continue Wellbutrin, Cymbalta  #. History of diabetes - Continue regular insulin sliding scale coverage  #. Tobacco Use disorder Patient counseled to quit smoking earlier this admission  #DO NOT RESUSCITATE status     Code Status Orders        Start     Ordered   10/12/16 0134  Full code  Continuous     10/12/16 0133    Code Status History    Date Active Date Inactive Code Status Order ID Comments User Context   10/12/2016  1:18 AM 10/12/2016  1:33 AM DNR [id number]  Harvie Bridge, DO ED   01/23/2016 10:03 PM 01/26/2016  2:54 PM DNR [id number]  Fritzi Mandes, MD Inpatient   12/09/2015 11:40 AM 12/12/2015  4:07 PM Full Code  [id number]  Epifanio Lesches, MD ED   10/24/2015  1:10 PM 10/27/2015  2:10 PM DNR [id number]  Gladstone Lighter, MD ED   10/20/2015 10:51 AM 10/23/2015  4:37 PM DNR [id number]  Laverle Hobby, MD Inpatient   10/19/2015  8:55 AM 10/20/2015 10:51 AM Full Code [id number]  Dustin Flock, MD Inpatient   10/18/2015 12:29 PM 10/19/2015  8:55 AM DNR [id number]  Laverle Hobby, MD Inpatient   10/18/2015  8:53 AM 10/18/2015 12:29 PM Full Code [id number]  Loletha Grayer, MD Inpatient   10/15/2015  4:43 PM 10/18/2015  8:53 AM DNR [id number]  Lytle Butte, MD ED   10/15/2015  4:43 PM 10/15/2015  4:43 PM Full Code [id number]  Lytle Butte, MD ED   06/26/2015 10:13 AM 06/28/2015  6:05 PM DNR [id number]  Bettey Costa, MD Inpatient   06/26/2015  6:05 AM 06/26/2015 10:13 AM Full Code [id number]  Saundra Shelling, MD Inpatient   06/08/2015 12:15 AM 06/10/2015  4:32 PM Full Code [id number]  Saundra Shelling, MD Inpatient    Advance Directive Documentation     Most Recent Value  Type of Advance Directive  Healthcare Power of [place], Living will  Pre-existing out of facility DNR order (yellow form or pink MOST form)  -  "MOST" Form in Place?  -      Consults  neurosurgery  DVT Prophylaxis  Lovenox    Lab Results  Component Value Date   PLT 316 10/12/2016   Time Spent in minutes   35 min   Hillary Bow R M.D on 10/14/2016 at 1:14 PM  Between 7am to 6pm - Pager - [phone]  After 6pm go to [email or web address] - password [email or web address] [facility]   Office  [phone]

## 2016-10-14 NOTE — Progress Notes (Signed)
Patient refuses brace for back

## 2016-10-14 NOTE — Progress Notes (Signed)
Inpatient Diabetes Program Recommendations  AACE/ADA: New Consensus Statement on Inpatient Glycemic Control (2015)  Target Ranges:  Prepandial:   less than 140 mg/dL      Peak postprandial:   less than 180 mg/dL (1-2 hours)      Critically ill patients:  140 - 180 mg/dL  Results for RONAK, DUQUETTE (MRN [id number]) as of 10/14/2016 14:08  Ref. Range 10/13/2016 07:23 10/13/2016 12:19 10/13/2016 18:15 10/13/2016 20:46 10/14/2016 07:28 10/14/2016 11:29  Glucose-Capillary Latest Ref Range: 65 - 99 mg/dL 109 (H) 173 (H) 177 (H) 203 (H) 116 (H) 316 (H)    Review of Glycemic Control  Diabetes history: DM2 Outpatient Diabetes medications: Glumetza 1000 mg BID Current orders for Inpatient glycemic control: Novolog 0-15 units TID with meals, Novolog 0-5 units QHS, Metformin 500 mg BID  Inpatient Diabetes Program Recommendations: Insulin - Meal Coverage: While inpatient and ordered steroids, please consider ordering Novolog 4 units TID with meals for meal coverage if patient is eating at least 50% of meals.  Thanks, Barnie Alderman, RN, MSN, CDE Diabetes Coordinator Inpatient Diabetes Program [phone] (Team Pager from 8am to 5pm)

## 2016-10-14 NOTE — Progress Notes (Signed)
PT Cancellation Note  Patient Details Name: Craig Wright MRN: [id number] DOB: 1965-09-16   Cancelled Treatment:    Reason Eval/Treat Not Completed: Other (comment).  Introduced self to pt and explained the role of PT and what PT has to offer.  Pt verbalized understanding and appreciative but politely refuses to work with therapy due to pain.  He replied "yes" when asked if he would like PT to attempt to see him tomorrow.  Will continue to follow pt acutely.   Collie Siad PT, DPT 10/14/2016, 3:07 PM

## 2016-10-14 NOTE — Consult Note (Signed)
Consultation Note Date: 10/14/2016   Patient Name: Craig Wright  DOB: [birth year]  MRN: [id number]  Age / Sex: 51 y.o., male  PCP: Marygrace Drought, MD Referring Physician: Hillary Bow, MD  Reason for Consultation: Establishing goals of care  HPI/Patient Profile: 51 y.o. 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 Prior to hospitalization, patient was at baseline when he sat on the toilet and felt a pop in his left mid back. PET scan on 8/1 revealed lung cancer progression with new hypermetabolic RLL and LUL pulmonary nodules. New skeletal metastasis at T10 vertebral body and left rib. Followed by Dr. Mike Gip. Notes reviewed. Neurosurgery following and recommending MRI and possible IR consideration for kyphoplasty. Significant pain. Followed by [facility] pain clinic. Current hospice patient with hospice diagnosis of COPD. Palliative medicine consultation for goals of care.   Clinical Assessment and Goals of Care: I have reviewed medical records, discussed with care team, and met with patient at bedside to discuss diagnosis, GOC, EOL wishes, disposition and options.  Introduced Palliative Medicine as specialized medical care for people living with serious illness. It focuses on providing relief from the symptoms and stress of a serious illness. The goal is to improve quality of life for both the patient and the family.  We discussed a brief life review of the patient. No spouse or children. Has lived in a group home for over a year. COPD for many years and tells me he had a trach in 2003. Tells me about his stage IV lung cancer to the bone. He has completed radiation/chemo. Prior to hospitalization, able to ambulate with walker but with  limited mobility due to pain. Pain became significantly worse prior to hospitalization when he was sitting on the toilet and felt a pop. Meals were provided at group home.   Discussed hospital diagnoses and interventions. He is not interested in MRI or kyphoplasty. "I have had that before and it did not help the pain." He also tells me he is not interested in further chemo. "It didn't help" and "the cancer spread."   I attempted to elicit values and goals of care important to the patient. He tells me "comfort" and "pain management" are most important to him. He would "rather die" than continue living his life with this much pain. "Sometimes I wish I would fall asleep and not wake up." Denies suicidal ideations. Only coping mechanism is to fall asleep.   Followed at [facility] pain clinic. Most recently taking Oxycontin 58m BID. Oxycodone 174mQID. Switched to Fentanyl 25 mcg patch today per attending. Patient tells me he has been on fentanyl patch 10016mprevious and "25 won't touch it." (Discussed with Dr. SudDarvin Neighboursill increase to 41m41m MichJameirls me pain is worse at night. He can't receive pain medication at group home past 7pm and until 8am. He does state relief from prn oxycodone.  Mendel is most concerned about returning to group home. He becomes very irritable stating "I can't even walk" and "they will walk all over me and steal my money." Hospice services was visiting but only 2x a week and for a short period of time. Interested in continued support from hospice services with focus on comfort and symptom management.   Advanced directives and concepts specific to code status were discussed. Patient confirms DNR/DNI. "why prolong my suffering?" His biggest support is his mother but she is mid 31's and unable to care for him. He has spoken of his wishes for DNR/DNI to his mother.   Therapeutic listening and emotional support provided. He is not interested in working with physical therapy today.     SUMMARY OF RECOMMENDATIONS    Patient confirms DNR/DNI.  Declining further aggressive interventions for metastatic lung cancer including kyphoplasty, chemo, radiation. Emphasizes a focus on comfort and pain management.   Interested in continued support by hospice outpatient. He will likely need a higher level of care. Discussed with social work--? Long term care bed at SNF.   PMT will continue to support patient through hospitalization.   Code Status/Advance Care Planning:  DNR  Symptom Management:   Fentanyl 58mg patch q72h  Oxycodone 187mq4h prn pain  Dilaudid 0.20m49mV q4h prn pain  Senna 1 tab BID daily  Colace 100m2mily prn  Heating pad as needed per patient request  Palliative Prophylaxis:   Bowel Regimen, Frequent Pain Assessment and Turn Reposition  Additional Recommendations (Limitations, Scope, Preferences):  Focus on comfort and pain management. Patient declining further MRI, kyphoplasty, chemotherapy, or radiation.   Psycho-social/Spiritual:   Desire for further Chaplaincy support: yes  Additional Recommendations: Caregiving  Support/Resources and Education on Hospice  Prognosis:   Unable to determine: guarded with metastatic lung cancer and COPD.   Discharge Planning: To Be Determined: may need to consider long term care. Patient wanting to continue hospice services     Primary Diagnoses: Present on Admission: . Intractable back pain   I have reviewed the medical record, interviewed the patient and family, and examined the patient. The following aspects are pertinent.  Past Medical History:  Diagnosis Date  . A-fib (HCC)[place]. Anxiety disorder   . Asthma   . CHF (congestive heart failure) (HCC)[place]. COPD (chronic obstructive pulmonary disease) (HCC)[place]. Coronary artery disease   . Diabetes mellitus without complication (HCC)[place]. Hypertension    Social History   Social History  . Marital status: Single    Spouse name: N/A  .  Number of children: N/A  . Years of education: N/A   Occupational History  . disabled    Social History Main Topics  . Smoking status: Former Smoker    Packs/day: 0.00    Years: 40.00  . Smokeless tobacco: Never Used  . Alcohol use No  . Drug use: No  . Sexual activity: Not Currently   Other Topics Concern  . None   Social History Narrative  . None   Family History  Problem Relation Age of Onset  . Cervical cancer Mother   . Colon cancer Father    Scheduled Meds: . buPROPion  100 mg Oral BH-q7a  . clonazePAM  1 mg Oral BID  . diltiazem  300 mg Oral Daily  . DULoxetine  60 mg Oral Daily  . enoxaparin (LOVENOX) injection  40 mg Subcutaneous Q24H  . feeding supplement (ENSURE ENLIVE)  237  mL Oral TID BM  . [START ON 10/17/2016] fentaNYL  50 mcg Transdermal Q72H  . fentaNYL  25 mcg Transdermal Once  . furosemide  40 mg Oral Daily  . guaiFENesin  600 mg Oral BID  . insulin aspart  0-15 Units Subcutaneous TID WC  . insulin aspart  0-5 Units Subcutaneous QHS  . lidocaine  1-3 patch Transdermal Q24H  . magnesium oxide  400 mg Oral Daily  . mouth rinse  15 mL Mouth Rinse BID  . megestrol  200 mg Oral Daily  . metFORMIN  500 mg Oral BID WC  . mirtazapine  45 mg Oral QHS  . mometasone-formoterol  2 puff Inhalation BID  . nicotine  21 mg Transdermal Daily  . potassium chloride SA  20 mEq Oral BID  . predniSONE  60 mg Oral Q breakfast  . roflumilast  500 mcg Oral Daily  . senna-docusate  1 tablet Oral BID  . sodium chloride flush  3 mL Intravenous Q12H   Continuous Infusions: . sodium chloride     PRN Meds:.sodium chloride, acetaminophen, albuterol, docusate sodium, gadobenate dimeglumine, guaifenesin, HYDROmorphone (DILAUDID) injection, ipratropium, ipratropium-albuterol, ondansetron, oxyCODONE, polyethylene glycol, senna, sodium chloride flush, traMADol Medications Prior to Admission:  Prior to Admission medications   Medication Sig Start Date End Date Taking?  Authorizing Provider  acetaminophen (TYLENOL) 325 MG tablet Take 650 mg by mouth every 4 (four) hours as needed (for pain/fever).    Yes [provider]  buPROPion (WELLBUTRIN SR) 100 MG 12 hr tablet Take 100 mg by mouth every morning.   Yes [provider]  clonazePAM (KLONOPIN) 1 MG tablet Take 1 mg by mouth 2 (two) times daily.   Yes [provider]  diltiazem (CARDIZEM CD) 240 MG 24 hr capsule Take 1 capsule (240 mg total) by mouth daily. 12/13/15  Yes Gouru, Illene Silver, MD  docusate sodium (COLACE) 50 MG capsule Take 2 capsules (100 mg total) by mouth daily as needed for mild constipation. 05/05/16  Yes Lucendia Herrlich, NP  DULoxetine (CYMBALTA) 60 MG capsule Take 60 mg by mouth daily.    Yes [provider]  furosemide (LASIX) 40 MG tablet Take 40 mg by mouth daily.    Yes [provider]  guaifenesin (COUGH SYRUP) 100 MG/5ML syrup Take 200 mg by mouth. 07/24/16  Yes [provider]  Ipratropium-Albuterol (COMBIVENT) 20-100 MCG/ACT AERS respimat Inhale 1 puff into the lungs every 4 (four) hours as needed for wheezing.  12/20/15  Yes [provider]  ipratropium-albuterol (DUONEB) 0.5-2.5 (3) MG/3ML SOLN Take 3 mLs by nebulization every 4 (four) hours as needed (for wheezing/shortness of breath).    Yes [provider]  levofloxacin (LEVAQUIN) 500 MG tablet Take 1 tablet (500 mg total) by mouth daily. 08/10/16  Yes Corcoran, Drue Second, MD  magnesium oxide (MAG-OX) 400 MG tablet Take 1 tablet (400 mg total) by mouth daily. 04/15/16  Yes Burns, Wandra Feinstein, NP  megestrol (MEGACE) 40 MG/ML suspension TAKE 1 TEASPOONFUL (68m) BY MOUTH ONCE DAILY. 06/09/16  Yes Corcoran, MDrue Second MD  metFORMIN (GLUMETZA) 500 MG (MOD) 24 hr tablet Take 2 tablets (1,000 mg total) by mouth 2 (two) times daily. Reported on 06/26/2015 10/27/15  Yes PDustin Flock MD  mirtazapine (REMERON) 45 MG tablet Take 45 mg by mouth at bedtime.   Yes [provider]  mometasone-formoterol (DULERA) 100-5 MCG/ACT AERO Inhale 2 puffs into the lungs 2 (two) times daily. 06/28/15  Yes PFritzi Mandes MD  nicotine (NICODERM CQ - DOSED IN MG/24 HOURS) 21 mg/24hr patch Place 1 patch (21 mg total) onto the skin daily. 12/13/15  Yes Gouru, Aruna, MD  ondansetron (ZOFRAN-ODT) 4 MG disintegrating tablet Take 4 mg by mouth every 8 (eight) hours as needed for nausea or vomiting.   Yes [provider]  oxyCODONE (OXYCONTIN) 20 mg 12 hr tablet Take 20 mg by mouth every 12 (twelve) hours.   Yes [provider]  Oxycodone HCl 10 MG TABS Take 10 mg by mouth 4 (four) times daily. 8am, 12n, 4pm, 8pm   Yes [provider]  OXYGEN Inhale 5 L into the lungs.   Yes [provider]  polyethylene glycol (MIRALAX / GLYCOLAX) packet Take 17 g by mouth daily as needed (for constipation).    Yes [provider]  potassium chloride SA (K-DUR,KLOR-CON) 20 MEQ tablet Take 20 mEq by mouth 2 (two) times daily.   Yes [provider]  roflumilast (DALIRESP) 500 MCG TABS tablet Take 500 mcg by mouth daily.   Yes [provider]  senna (SENNA-LAX) 8.6 MG tablet Take 2 tablets by mouth daily as needed for constipation.   Yes [provider]   Allergies  Allergen Reactions  . Tape Rash   Review of Systems  Constitutional: Positive for activity change.  Musculoskeletal: Positive for back pain.   Physical Exam  Constitutional: He is oriented to person, place, and time. He is cooperative. He appears ill.  HENT:  Head: Normocephalic and atraumatic.  Cardiovascular: Regular rhythm.   Pulmonary/Chest: No accessory muscle usage. No tachypnea. No respiratory distress.  Abdominal: Normal appearance.  Neurological: He is alert and oriented to person, place, and time.  Skin: Skin is warm and dry. There is pallor.  Psychiatric:  irritable  Nursing note and vitals reviewed.  Vital Signs: BP 128/66 (BP Location: Right Arm)   Pulse  (!) 123   Temp 97.8 F (36.6 C) (Oral)   Resp 18   Ht _0  (1.803 m)   Wt 77.1 kg (170 lb)   SpO2 93%   BMI 23.71 kg/m  Pain Assessment: 0-10 POSS *See Group Information*: 1-Acceptable,Awake and alert Pain Score: Asleep  SpO2: SpO2: 93 % O2 Device:SpO2: 93 % O2 Flow Rate: .O2 Flow Rate (L/min): 5 L/min  IO: Intake/output summary:   Intake/Output Summary (Last 24 hours) at 10/14/16 1504 Last data filed at 10/14/16 1194  Gross per 24 hour  Intake                0 ml  Output             1075 ml  Net            -1075 ml   LBM: Last BM Date: 10/11/16 Baseline Weight: Weight: 77.1 kg (170 lb) Most recent weight: Weight: 77.1 kg (170 lb)     Palliative Assessment/Data: PPS 40%   Flowsheet Rows     Most Recent Value  Intake Tab  Referral Department  Hospitalist  Unit at Time of Referral  Oncology Unit  Palliative Care Primary Diagnosis  Cancer  Palliative Care Type  New Palliative care  Reason for referral  Clarify Goals of Care, Pain  Date first seen by Palliative Care  10/14/16  Clinical Assessment  Palliative Performance Scale Score  40%  Psychosocial & Spiritual Assessment  Palliative Care Outcomes  Patient/Family meeting held?  Yes  Who was at the meeting?  patient  Palliative Care Outcomes  Improved pain  interventions, Improved non-pain symptom therapy, Clarified goals of care, Provided end of life care assistance, Provided psychosocial or spiritual support, ACP counseling assistance, Transitioned to hospice, Counseled regarding hospice      Time In: 1350 Time Out: 1500 Time Total: 70 min Greater than 50%  of this time was spent counseling and coordinating care related to the above assessment and plan.  Signed by:  Ihor Dow, FNP-C Palliative Medicine Team  Phone: [phone] Fax: [phone]   Please contact Palliative Medicine Team phone at [phone] for questions and concerns.  For individual provider: See Shea Evans

## 2016-10-14 NOTE — Progress Notes (Signed)
Pnt reported after awakening pain 10/10. Called on call Dr. Estanislado Pandy to notify of pnt's pain level. Per orders morphine 2mg  IV x1. Will medicate pnt and assess comfort level in 1 hour. No other concerns at this time will continue to monitor and assess.

## 2016-10-15 DIAGNOSIS — Z515 Encounter for palliative care: Secondary | ICD-10-CM

## 2016-10-15 LAB — GLUCOSE, CAPILLARY
GLUCOSE-CAPILLARY: 176 mg/dL — AB (ref 65–99)
GLUCOSE-CAPILLARY: 251 mg/dL — AB (ref 65–99)
Glucose-Capillary: 119 mg/dL — ABNORMAL HIGH (ref 65–99)
Glucose-Capillary: 165 mg/dL — ABNORMAL HIGH (ref 65–99)

## 2016-10-15 MED ORDER — POLYETHYLENE GLYCOL 3350 17 G PO PACK
17.0000 g | PACK | Freq: Every day | ORAL | Status: DC
  Administered 2016-10-15 – 2016-10-18 (×4): 17 g via ORAL
  Filled 2016-10-15: qty 1

## 2016-10-15 MED ORDER — PREDNISONE 20 MG PO TABS
20.0000 mg | ORAL_TABLET | Freq: Every day | ORAL | Status: AC
  Administered 2016-10-16 – 2016-10-17 (×2): 20 mg via ORAL
  Filled 2016-10-15 (×2): qty 1

## 2016-10-15 MED ORDER — OXYCODONE HCL ER 40 MG PO T12A
40.0000 mg | EXTENDED_RELEASE_TABLET | Freq: Two times a day (BID) | ORAL | Status: DC
  Administered 2016-10-15 – 2016-10-21 (×13): 40 mg via ORAL
  Filled 2016-10-15 (×13): qty 1

## 2016-10-15 NOTE — Progress Notes (Signed)
[place] at [facility]                                                                                                                                                                                  Patient Demographics   Craig Wright, is a 51 y.o. male, DOB - 14-Jun-1965, MBT:[id number]  Admit date - 10/11/2016   Admitting Physician Harvie Bridge, DO  Outpatient Primary MD for the patient is Marygrace Drought, MD   LOS - 1  Subjective:  Pain not well controlled. Wants to go back to oxycontin as fentanyl is not working. Constipated  Has not ordered any food for over a day   Review of Systems:   CONSTITUTIONAL: No documented fever. No fatigue, weakness. No weight gain, no weight loss.  EYES: No blurry or double vision.  ENT: No tinnitus. No postnasal drip. No redness of the oropharynx.  RESPIRATORY:Positive cough, positive wheeze, no hemoptysis. Positive dyspnea.  CARDIOVASCULAR: No chest pain. No orthopnea. No palpitations. No syncope.  GASTROINTESTINAL: No nausea, no vomiting or diarrhea. No abdominal pain. No melena or hematochezia.  GENITOURINARY: No dysuria or hematuria.  ENDOCRINE: No polyuria or nocturia. No heat or cold intolerance.  HEMATOLOGY: No anemia. No bruising. No bleeding.  INTEGUMENTARY: No rashes. No lesions.  MUSCULOSKELETAL: No arthritis. No swelling. No gout. Pain all over NEUROLOGIC: No numbness, tingling, or ataxia. No seizure-type activity.  PSYCHIATRIC: No anxiety. No insomnia. No ADD.    Vitals:   Vitals:   10/14/16 0416 10/14/16 1505 10/14/16 1947 10/15/16 0432  BP: 128/66 120/65 133/76 131/80  Pulse: (!) 123 (!) 124 (!) 117 (!) 111  Resp: 18  20 18   Temp: 97.8 F (36.6 C) 97.9 F (36.6 C) 97.8 F (36.6 C) 98.2 F (36.8 C)  TempSrc: Oral Oral Oral Oral  SpO2: 93% 93% 93% 94%  Weight:      Height:        Wt Readings from Last 3 Encounters:  10/11/16 77.1 kg (170 lb)  10/09/16 78 kg (172 lb)  09/28/16  79.2 kg (174 lb 9 oz)     Intake/Output Summary (Last 24 hours) at 10/15/16 0917 Last data filed at 10/15/16 5364  Gross per 24 hour  Intake                0 ml  Output              500 ml  Net             -500 ml    Physical Exam:   GENERAL: Pleasant-appearing in no apparent distress.  HEAD, EYES, EARS, NOSE AND THROAT: Atraumatic, normocephalic. Extraocular muscles  are intact. Pupils equal and reactive to light. Sclerae anicteric. No conjunctival injection. No oro-pharyngeal erythema.  NECK: Supple. There is no jugular venous distention. No bruits, no lymphadenopathy, no thyromegaly.  HEART: Regular rate and rhythm,. No murmurs, no rubs, no clicks.  LUNGS: Wheezing and rhonchus breath sounds on the right lung, left lung occasional wheezing  ABDOMEN: Soft, flat, nontender, nondistended. Has good bowel sounds. No hepatosplenomegaly appreciated.  EXTREMITIES: No evidence of any cyanosis, clubbing, or peripheral edema.  +2 pedal and radial pulses bilaterally.  NEUROLOGIC: The patient is alert, awake, and oriented x3 with no focal motor or sensory deficits appreciated bilaterally.  SKIN: Moist and warm with no rashes appreciated.  Psych: Not anxious.  LN: No inguinal LN enlargement    Antibiotics   Anti-infectives    Start     Dose/Rate Route Frequency Ordered Stop   10/12/16 1230  azithromycin (ZITHROMAX) tablet 500 mg     500 mg Oral Daily 10/12/16 1133 10/14/16 1002      Medications   Scheduled Meds: . buPROPion  100 mg Oral BH-q7a  . clonazePAM  1 mg Oral BID  . diltiazem  300 mg Oral Daily  . DULoxetine  60 mg Oral Daily  . enoxaparin (LOVENOX) injection  40 mg Subcutaneous Q24H  . feeding supplement (ENSURE ENLIVE)  237 mL Oral TID BM  . furosemide  40 mg Oral Daily  . guaiFENesin  600 mg Oral BID  . insulin aspart  0-15 Units Subcutaneous TID WC  . insulin aspart  0-5 Units Subcutaneous QHS  . lidocaine  1-3 patch Transdermal Q24H  . magnesium oxide  400 mg Oral  Daily  . mouth rinse  15 mL Mouth Rinse BID  . megestrol  200 mg Oral Daily  . metFORMIN  500 mg Oral BID WC  . mirtazapine  45 mg Oral QHS  . mometasone-formoterol  2 puff Inhalation BID  . nicotine  21 mg Transdermal Daily  . oxyCODONE  40 mg Oral Q12H  . polyethylene glycol  17 g Oral Daily  . potassium chloride SA  20 mEq Oral BID  . [START ON 10/16/2016] predniSONE  20 mg Oral Q breakfast  . roflumilast  500 mcg Oral Daily  . senna-docusate  1 tablet Oral BID  . sodium chloride flush  3 mL Intravenous Q12H   Continuous Infusions: . sodium chloride     PRN Meds:.sodium chloride, acetaminophen, albuterol, docusate sodium, gadobenate dimeglumine, guaifenesin, HYDROmorphone (DILAUDID) injection, ipratropium, ipratropium-albuterol, ondansetron, oxyCODONE, polyethylene glycol, senna, sodium chloride flush, traMADol   Data Review:   Micro Results No results found for this or any previous visit (from the past 240 hour(s)).  Radiology Reports Ct Thoracic Spine Wo Contrast  Result Date: 10/12/2016 CLINICAL DATA:  Acute onset of mid back pain while seated today. No acute injury. Lung cancer metastatic to bone. EXAM: CT THORACIC AND LUMBAR SPINE WITHOUT CONTRAST TECHNIQUE: Multidetector CT imaging of the thoracic and lumbar spine was performed without contrast. Multiplanar CT image reconstructions were also generated. COMPARISON:  Abdominopelvic CT 10/11/2016. PET-CT 09/23/2016. Chest CT 08/06/2016. FINDINGS: CT THORACIC SPINE FINDINGS Alignment: Normal. Vertebrae: Known lytic metastasis involving the left T10 pedicle, transverse process and lamina is grossly stable, measuring up to 2.6 x 1.8 cm on image 120. In correlation with prior PET-CT, there are probable metastases involving the T7 spinous process and left eighth rib near the costovertebral junction. No definite new lesions. No evidence of pathologic fracture or epidural tumor. Paraspinal and other soft  tissues: No paraspinal masses are  seen. Right infrahilar mass, patchy right basilar airspace disease and a right pleural effusion appears similar to recent abdominal CT. Disc levels: Mild degenerative changes throughout the spine with mild endplate degeneration. No large disc herniation, spinal stenosis or nerve root encroachment seen. CT LUMBAR SPINE FINDINGS Segmentation: Normal. Alignment: Normal. Vertebrae: No lytic or enlarging blastic lesions are seen within the lumbar spine. There is a stable small sclerotic lesion in the right aspect of the L4 vertebral body, likely benign. There is a large lytic lesion within the left iliac bone abutting the sacroiliac joint, incompletely visualized, although grossly stable from prior studies. No evidence of pathologic fracture. Paraspinal and other soft tissues: No paraspinal masses are seen. Enlarged right adrenal gland again noted, consistent with a metastasis. There is aortic and branch vessel atherosclerosis. Disc levels: No significant disc space findings are seen within the lumbar spine at or above L3-4. L4-5: Loss of disc height with annular disc bulging and a probable left paracentral disc extrusion. There is inferior migration of disc material, mass effect on the thecal sac and probable left L5 nerve root encroachment. There is no foraminal compromise or L4 nerve root encroachment. L5-S1:  Normal interspace. IMPRESSION: 1. Lytic lesions involving the T7 spinous process, left eighth rib, left aspect of the T10 posterior elements and left iliac bone are similar to the recent prior studies and consistent with metastatic lung cancer. No enlarging lesions, pathologic fracture or epidural tumor seen. 2. Disc extrusion on the left at L4-5 may be symptomatic on the basis of left L5 nerve root encroachment in the canal. 3. Grossly stable findings of metastatic lung cancer in the right hemithorax and right adrenal metastasis. Electronically Signed   By: Richardean Sale M.D.   On: 10/12/2016 15:03   Ct  Lumbar Spine Wo Contrast  Result Date: 10/12/2016 CLINICAL DATA:  Acute onset of mid back pain while seated today. No acute injury. Lung cancer metastatic to bone. EXAM: CT THORACIC AND LUMBAR SPINE WITHOUT CONTRAST TECHNIQUE: Multidetector CT imaging of the thoracic and lumbar spine was performed without contrast. Multiplanar CT image reconstructions were also generated. COMPARISON:  Abdominopelvic CT 10/11/2016. PET-CT 09/23/2016. Chest CT 08/06/2016. FINDINGS: CT THORACIC SPINE FINDINGS Alignment: Normal. Vertebrae: Known lytic metastasis involving the left T10 pedicle, transverse process and lamina is grossly stable, measuring up to 2.6 x 1.8 cm on image 120. In correlation with prior PET-CT, there are probable metastases involving the T7 spinous process and left eighth rib near the costovertebral junction. No definite new lesions. No evidence of pathologic fracture or epidural tumor. Paraspinal and other soft tissues: No paraspinal masses are seen. Right infrahilar mass, patchy right basilar airspace disease and a right pleural effusion appears similar to recent abdominal CT. Disc levels: Mild degenerative changes throughout the spine with mild endplate degeneration. No large disc herniation, spinal stenosis or nerve root encroachment seen. CT LUMBAR SPINE FINDINGS Segmentation: Normal. Alignment: Normal. Vertebrae: No lytic or enlarging blastic lesions are seen within the lumbar spine. There is a stable small sclerotic lesion in the right aspect of the L4 vertebral body, likely benign. There is a large lytic lesion within the left iliac bone abutting the sacroiliac joint, incompletely visualized, although grossly stable from prior studies. No evidence of pathologic fracture. Paraspinal and other soft tissues: No paraspinal masses are seen. Enlarged right adrenal gland again noted, consistent with a metastasis. There is aortic and branch vessel atherosclerosis. Disc levels: No significant disc space findings  are seen within the lumbar spine at or above L3-4. L4-5: Loss of disc height with annular disc bulging and a probable left paracentral disc extrusion. There is inferior migration of disc material, mass effect on the thecal sac and probable left L5 nerve root encroachment. There is no foraminal compromise or L4 nerve root encroachment. L5-S1:  Normal interspace. IMPRESSION: 1. Lytic lesions involving the T7 spinous process, left eighth rib, left aspect of the T10 posterior elements and left iliac bone are similar to the recent prior studies and consistent with metastatic lung cancer. No enlarging lesions, pathologic fracture or epidural tumor seen. 2. Disc extrusion on the left at L4-5 may be symptomatic on the basis of left L5 nerve root encroachment in the canal. 3. Grossly stable findings of metastatic lung cancer in the right hemithorax and right adrenal metastasis. Electronically Signed   By: Richardean Sale M.D.   On: 10/12/2016 15:03   Nm Pet Image Initial (pi) Skull Base To Thigh  Result Date: 09/23/2016 CLINICAL DATA:  Subsequent treatment strategy for lung carcinoma. EXAM: NUCLEAR MEDICINE PET SKULL BASE TO THIGH TECHNIQUE: 13.4 mCi F-18 FDG was injected intravenously. Full-ring PET imaging was performed from the skull base to thigh after the radiotracer. CT data was obtained and used for attenuation correction and anatomic localization. The PET data acquisition was terminated after completing a skullbase to iliac crest data acquisition. Patient was extremely uncomfortable could not tolerate lying supine. FASTING BLOOD GLUCOSE:  Value: 119 mg/dl COMPARISON:  CT 08/06/2016, PET-CT 1198 FINDINGS: NECK No hypermetabolic lymph nodes in the neck. CHEST RIGHT lower lobe mass is similar in size and metabolic activity measuring 2.7 cm with SUV max equal 7.3 compared to 11.6 on prior. However, there are new nodules in the RIGHT lower lobe compared to most recent CT scan and PET-CT scan which are hypermetabolic.  Additionally there is a new hypermetabolic nodule within the LEFT lung upper lobe measuring 10 mm (image 96, series 3) with SUV max equal 4.1. Second lingular nodule is hypermetabolic on image 063. There is new hypermetabolic mediastinal lymphadenopathy. For example subcarinal lymph node with SUV max equals 6.0. RIGHT paratracheal nodes which are hypermetabolic. RIGHT hilar lymph node is newly enlarged and hypermetabolic. Prevascular lymph nodes are hypermetabolic with SUV max equal 3.9. ABDOMEN/PELVIS There is new enlargement of the RIGHT adrenal gland which is hypermetabolic with SUV max equals 7.1. The more inferior abdomen and pelvis is not imaged by PET imaging.imaging. Defect within the RIGHT iliac wing is favored related to prior bone harvesting. SKELETON New hypermetabolic lesion in the K16 pedicle on the LEFT (image 142, series 3) with SUV max equal 6.5. New rib lesion in T8 on the LEFT adjacent to the transverse process with SUV max equal 4.6. IMPRESSION: 1. Lung cancer progression with new hypermetabolic RIGHT lower lobe and LEFT upper lobe pulmonary nodules. 2. New hypermetabolic mediastinal lymph nodes consistent metastasis. 3. Hypermetabolic RIGHT adrenal gland metastasis. 4. New skeletal metastasis to the T10 vertebral body and LEFT rib. T 5. Inferior abdomen and pelvis not imaged by PET due to patient discomfort. Electronically Signed   By: Suzy Bouchard M.D.   On: 09/23/2016 13:51   Ct Renal Stone Study  Result Date: 10/11/2016 CLINICAL DATA:  Acute left flank pain.  Known lung cancer. EXAM: CT ABDOMEN AND PELVIS WITHOUT CONTRAST TECHNIQUE: Multidetector CT imaging of the abdomen and pelvis was performed following the standard protocol without IV contrast. COMPARISON:  PET-CT 09/23/2016 FINDINGS: Lower chest: Pulmonary masses are  seen within the right middle lobe, right lower lobe and left lower lobe, consistent with known metastatic lung cancer. There is a small right pleural effusion. Small  pericardial effusion. Hepatobiliary: No focal liver abnormality is seen. No gallstones, gallbladder wall thickening, or biliary dilatation. Pancreas: Unremarkable. No pancreatic ductal dilatation or surrounding inflammatory changes. Spleen: Normal in size without focal abnormality. Adrenals/Urinary Tract: Known metastatic right adrenal mass measures 18 mm in cross-section. No evidence of nephrolithiasis, hydronephrosis or obstructive uropathy. The urinary bladder is normal, although partially obscured by beam hardening artifact from left hip prosthesis. Stomach/Bowel: Stomach is within normal limits. Appendix appears normal. No evidence of bowel wall thickening, distention, or inflammatory changes. Vascular/Lymphatic: Calcific atherosclerotic disease of the abdominal aorta. Numerous mildly enlarged retroperitoneal lymph nodes centered about the takeoff of the renal arteries. Reproductive: Prostate is unremarkable. Other: No abdominal wall hernia or abnormality. No abdominopelvic ascites. Musculoskeletal: 10 mm sclerotic focus within the superior aspect of L4 vertebral body. The previously demonstrated by PET-CT metastatic deposit within T10 vertebral body corresponds to a lytic lesion within the left pedicle, axial image 6/91 sequence 2 and sagittal image 101/165, sequence 6. There is a small soft tissue component to this lesion. Large permeative lytic lesion within the left ilium, bordering the SI joint measures 5.3 cm. IMPRESSION: No evidence of obstructive uropathy or nephrolithiasis. Pulmonary masses within the right middle lobe, right lower lobe, left lower lobe, consistent with known metastatic lung cancer. Small to moderate right pleural effusion. Small pericardial effusion. Known metastatic right adrenal mass. Numerous mildly enlarged retroperitoneal upper abdominal lymph nodes, likely metastatic. T10 left pedicle metastatic lytic lesion. Given its proximity to the osseous neural foramen, it may have caused  patient's symptoms. Large permeative lytic lesion within the left ilium causing cortical destruction, probably also metastatic. Electronically Signed   By: Fidela Salisbury M.D.   On: 10/11/2016 21:11     CBC  Recent Labs Lab 10/09/16 1001 10/11/16 2032 10/12/16 0346  WBC 8.6 6.1 5.3  HGB 10.9* 10.6* 10.2*  HCT 32.2* 31.6* 30.5*  PLT 356 355 316  MCV 83.0 84.3 84.4  MCH 28.0 28.3 28.1  MCHC 33.7 33.5 33.3  RDW 14.6* 14.4 14.5  LYMPHSABS 0.5*  --   --   MONOABS 1.1*  --   --   EOSABS 0.0  --   --   BASOSABS 0.0  --   --     Chemistries   Recent Labs Lab 10/09/16 1001 10/11/16 2032 10/12/16 0346  NA 131* 137 138  K 4.1 4.1 4.1  CL 94* 100* 105  CO2 25 29 28   GLUCOSE 207* 194* 121*  BUN 13 14 10   CREATININE 0.85 0.72 0.59*  CALCIUM 9.2 8.3* 8.1*  AST 36 38  --   ALT 27 35  --   ALKPHOS 74 74  --   BILITOT 0.6 0.2*  --    ------------------------------------------------------------------------------------------------------------------ estimated creatinine clearance is 115 mL/min (A) (by C-G formula based on SCr of 0.59 mg/dL (L)). ------------------------------------------------------------------------------------------------------------------ No results for input(s): HGBA1C in the last 72 hours. ------------------------------------------------------------------------------------------------------------------ No results for input(s): CHOL, HDL, LDLCALC, TRIG, CHOLHDL, LDLDIRECT in the last 72 hours. ------------------------------------------------------------------------------------------------------------------ No results for input(s): TSH, T4TOTAL, T3FREE, THYROIDAB in the last 72 hours.  Invalid input(s): FREET3 ------------------------------------------------------------------------------------------------------------------ No results for input(s): VITAMINB12, FOLATE, FERRITIN, TIBC, IRON, RETICCTPCT in the last 72 hours.  Coagulation profile No results  for input(s): INR, PROTIME in the last 168 hours.  No results for input(s): DDIMER in the  last 72 hours.  Cardiac Enzymes No results for input(s): CKMB, TROPONINI, MYOGLOBIN in the last 168 hours.  Invalid input(s): CK ------------------------------------------------------------------------------------------------------------------ Invalid input(s): [place]   This is a 51 y.o. male with a history of atrial fibrillation, asthma, COPD, congestive heart failure, coronary artery disease, diabetes,chronic hep C hypertension, lung cancer with metastasis to the bone now being admitted with:  # Severe back pain due to lytic lesions of T7 and T10. Also involved are left eighth rib and left iliac bone from epistaxis of lung cancer. MRI of the thoracolumbar spine recommended by neurosurgery. Patient is refusing an MRI at this time and does not want any surgeries or kyphoplasty.  Discontinue fentanyl. Start Oxycontin 40mg  BID. Oxycodone 10mg  Q 4 hr PRN.  #Acute on chronic COPD exacerbation with acute on chronic respiratory failure Improved. Feels he is back to baseline. Reduce prednisone Nebs PRN  #. Lung cancer with metastatic disease Prognosis poor. Will need hospice at discharge  #. History of hypertension and atrial fibrillation -Continue Cardizem  #. 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 No pre-meal Novolog as patient not eating well  #. Tobacco Use disorder Patient counseled to quit smoking earlier this admission  #DO NOT RESUSCITATE status     Code Status Orders        Start     Ordered   10/12/16 0134  Full code  Continuous     10/12/16 0133    Code Status History    Date Active Date Inactive Code Status Order ID Comments User Context   10/12/2016  1:18 AM 10/12/2016  1:33 AM DNR [id number]  Harvie Bridge, DO ED    01/23/2016 10:03 PM 01/26/2016  2:54 PM DNR [id number]  Fritzi Mandes, MD Inpatient   12/09/2015 11:40 AM 12/12/2015  4:07 PM Full Code [id number]  Epifanio Lesches, MD ED   10/24/2015  1:10 PM 10/27/2015  2:10 PM DNR [id number]  Gladstone Lighter, MD ED   10/20/2015 10:51 AM 10/23/2015  4:37 PM DNR [id number]  Laverle Hobby, MD Inpatient   10/19/2015  8:55 AM 10/20/2015 10:51 AM Full Code [id number]  Dustin Flock, MD Inpatient   10/18/2015 12:29 PM 10/19/2015  8:55 AM DNR [id number]  Laverle Hobby, MD Inpatient   10/18/2015  8:53 AM 10/18/2015 12:29 PM Full Code [id number]  Loletha Grayer, MD Inpatient   10/15/2015  4:43 PM 10/18/2015  8:53 AM DNR [id number]  Lytle Butte, MD ED   10/15/2015  4:43 PM 10/15/2015  4:43 PM Full Code [id number]  Lytle Butte, MD ED   06/26/2015 10:13 AM 06/28/2015  6:05 PM DNR [id number]  Bettey Costa, MD Inpatient   06/26/2015  6:05 AM 06/26/2015 10:13 AM Full Code [id number]  Saundra Shelling, MD Inpatient   06/08/2015 12:15 AM 06/10/2015  4:32 PM Full Code [id number]  Saundra Shelling, MD Inpatient    Advance Directive Documentation     Most Recent Value  Type of Advance Directive  Healthcare Power of [place], Living will  Pre-existing out of facility DNR order (yellow form or pink MOST form)  -  "MOST" Form in Place?  -      Consults  neurosurgery  DVT Prophylaxis  Lovenox    Lab Results  Component Value Date   PLT 316 10/12/2016   Time Spent in minutes   35 min   Neita Carp M.D on  10/15/2016 at 9:17 AM  Between 7am to 6pm - Pager - [phone]  After 6pm go to [email or web address] - password [email or web address] [facility]   Office  [phone]

## 2016-10-15 NOTE — Clinical Social Work Note (Signed)
CSW met with pt and mother to address disposition planning. Pt is in need of a a higher level of care and is unable to return to family care home safely. Pt is established with [facility]. Pt and mother are agreeable to SNF placement. Pt is Medicaid Only with Hospice services. CSW initiated SNF search and will follow up with bed offers. [facility] pending. CSW will continuing to follow.   Darden Dates, MSW, LCSW  Clinical Social Worker  [phone]
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
## 2016-10-15 NOTE — Evaluation (Signed)
Physical Therapy Evaluation Patient Details Name: Craig Wright MRN: [id number] DOB: Dec 09, 1965 Today's Date: 10/15/2016   History of Present Illness  Craig Wright is a 51 y.o. 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  Patient was in a usual state of health until today when he was sitting on the toilet and felt a pop in his left mid back. He reports worsening of his shortness of breath because of pain with deep inspiration. Patient denies fevers/chills, weakness, dizziness, chest pain, N/V/C/D, abdominal pain, dysuria/frequency, changes in mental status. Otherwise there has been no change in status. Patient has been taking medication as prescribed and there has been no recent change in medication or diet.  No recent antibiotics.  There has been no recent illness, hospitalizations, travel or sick contacts. Pt is now admitted for intractable back pain with lesions at T7, T10, L eighth rib, and L ilium. Pt also with acute on chronic COPD exacerbation. Pt is being followed by Hospice currently.   Clinical Impression  Pt admitted with above diagnosis. Pt currently with functional limitations due to the deficits listed below (see PT Problem List).  Pt is currently very weak. He requires modA+1 for sit to stand transfers. Pt ambulates to door and back to bed with therapist and rolling walker. Difficulty utilizing LUE due to L hand weakness so pt presses down on walker with a closed fist. He has a platform walker that he can use at home. About halfway back to bed he becomes very fatigued and his legs start to give out. He requires modA+1 from therapist to be able to make it back to the bed without falling. Gait speed is slow and labored. Pt with DOE on 6 L/min supplement O2 and SaO2 drops to  87% during ambulation. HR increases to 138 bpm. After seated rest break at EOB HR comes down and SaO2 improves to 92%. Pt is currently significantly weaker than his baseline functioning. He would benefit from SNF placement at this time in order to return to baseline strength so that he can safely ambulate. Pt will benefit from PT services to address deficits in strength, balance, and mobility in order to return to full function at home.      Follow Up Recommendations SNF    Equipment Recommendations  None recommended by PT;Other (comment);3in1 (PT) (utilize his platform walker at discharge, needs [facility] )    Recommendations for Other Services       Precautions / Restrictions Precautions Precautions: Fall Restrictions Weight Bearing Restrictions: No      Mobility  Bed Mobility               General bed mobility comments: Pt received upright and left upright at EOB  Transfers Overall transfer level: Needs assistance Equipment used: Rolling walker (2 wheeled) Transfers: Sit to/from Stand Sit to Stand: Mod assist         General transfer comment: Pt requires assist to come to standing due to LE weakness. Safe hand placement without need for cues. Once upright pt is able to remain standing without external support but more stable with hands on walker  Ambulation/Gait Ambulation/Gait assistance: Mod assist Ambulation Distance (Feet): 20 Feet Assistive device: Rolling walker (2 wheeled) Gait Pattern/deviations: Decreased step length - right;Decreased step length - left Gait velocity: Decreased Gait velocity interpretation: <1.8  ft/sec, indicative of risk for recurrent falls General Gait Details: Pt ambulates to door and back to bed with therapist and rolling walker. Difficulty utilizing LUE due to L hand weakness so pt presses down on walker with a closed fist. He has a platform walker that he can use at home. About halway back to bed he becomes very fatigued and his legs start  to give out. He requires modA+1 from therapist to be able to make it back to the bed without falling. Gait speed is slow and labored. Pt with DOE on 6 L/min supplement O2 and SaO2 drops to 87%. HR increases to 138 bpm. After seated rest break at EOB HR comes down and SaO2 improves to 92%.  Stairs            Wheelchair Mobility    Modified Rankin (Stroke Patients Only)       Balance Overall balance assessment: Needs assistance Sitting-balance support: No upper extremity supported Sitting balance-Leahy Scale: Normal     Standing balance support: No upper extremity supported Standing balance-Leahy Scale: Fair Standing balance comment: Able to remain standing without UE support but more stable with support on walker                             Pertinent Vitals/Pain Pain Assessment: 0-10 Pain Score: 7  Pain Location: Bilateral hips and knees Pain Descriptors / Indicators: Sharp Pain Intervention(s): Monitored during session    Home Living Family/patient expects to be discharged to:: Unsure Living Arrangements: Group Home Available Help at Discharge: Other (Comment) (Hospice services) Type of Home: House Home Access: Ramped entrance     Home Layout: One level Home Equipment: [place] - 2 wheels;Cane - single point (Pt reports he has platform on walker for LUE, needs BSC) Additional Comments: Pt lives at a group home currently. He reports that they are unable to care for him in his current condition    Prior Function Level of Independence: Needs assistance   Gait / Transfers Assistance Needed: Pt reports that he was previously independent for ambulation with single point cane but has had a "couple falls". Hospice has been assisting pt with ADLs such as bathing and group home assists with IADLs           Hand Dominance   Dominant Hand: Right    Extremity/Trunk Assessment   Upper Extremity Assessment Upper Extremity Assessment: LUE deficits/detail LUE  Deficits / Details: L grip weakness and finger extensor weakness    Lower Extremity Assessment Lower Extremity Assessment: Generalized weakness;LLE deficits/detail LLE Deficits / Details: Pt with gross weakness throughout bilateral LE and unable to demonstrate active dorsiflexion in sitting of L ankle       Communication   Communication: No difficulties  Cognition Arousal/Alertness: Awake/alert Behavior During Therapy: WFL for tasks assessed/performed Overall Cognitive Status: Within Functional Limits for tasks assessed                                        General Comments      Exercises     Assessment/Plan    PT Assessment Patient needs continued PT services  PT Problem List Decreased strength;Decreased activity tolerance;Decreased balance;Decreased mobility;Cardiopulmonary status limiting activity;Pain       PT Treatment Interventions DME instruction;Gait training;Therapeutic activities;Therapeutic exercise;Balance training;Neuromuscular re-education;Patient/family education    PT Goals (Current goals can be  found in the Care Plan section)  Acute Rehab PT Goals Patient Stated Goal: Improve strength and return to baseline function PT Goal Formulation: With patient Time For Goal Achievement: 10/29/16 Potential to Achieve Goals: Fair    Frequency Min 2X/week   Barriers to discharge Decreased caregiver support Pt will need assistance for all mobility at discharge    Co-evaluation               AM-PAC PT "6 Clicks" Daily Activity  Outcome Measure Difficulty turning over in bed (including adjusting bedclothes, sheets and blankets)?: A Little Difficulty moving from lying on back to sitting on the side of the bed? : A Little Difficulty sitting down on and standing up from a chair with arms (e.g., wheelchair, bedside commode, etc,.)?: A Little Help needed moving to and from a bed to chair (including a wheelchair)?: A Little Help needed walking in  hospital room?: A Lot Help needed climbing 3-5 steps with a railing? : Total 6 Click Score: 15    End of Session Equipment Utilized During Treatment: Gait belt;Oxygen Activity Tolerance: Patient tolerated treatment well Patient left: in bed;with call bell/phone within reach;with bed alarm set;Other (comment) (sitting at EOB) Nurse Communication: Mobility status PT Visit Diagnosis: Unsteadiness on feet (R26.81);Muscle weakness (generalized) (M62.81);History of falling (Z91.81);Pain Pain - Right/Left:  (Bilateral) Pain - part of body: Hip    Time: 1202-1222 PT Time Calculation (min) (ACUTE ONLY): 20 min   Charges:   PT Evaluation $PT Eval Moderate Complexity: 1 Mod     PT G Codes:   PT G-Codes **NOT FOR INPATIENT CLASS** Functional Assessment Tool Used: AM-PAC 6 Clicks Basic Mobility Functional Limitation: Mobility: Walking and moving around Mobility: Walking and Moving Around Current Status (X5284): At least 40 percent but less than 60 percent impaired, limited or restricted Mobility: Walking and Moving Around Goal Status [phone]): At least 20 percent but less than 40 percent impaired, limited or restricted    Phillips Grout PT, DPT    Huprich,Jason 10/15/2016, 2:39 PM

## 2016-10-15 NOTE — Progress Notes (Signed)
Inpatient Diabetes Program Recommendations  AACE/ADA: New Consensus Statement on Inpatient Glycemic Control (2015)  Target Ranges:  Prepandial:   less than 140 mg/dL      Peak postprandial:   less than 180 mg/dL (1-2 hours)      Critically ill patients:  140 - 180 mg/dL   Lab Results  Component Value Date   GLUCAP 119 (H) 10/15/2016   HGBA1C 8.0 (H) 10/15/2015    Review of Glycemic Control  Results for ED, MANDICH (MRN [id number]) as of 10/15/2016 08:53  Ref. Range 10/14/2016 07:28 10/14/2016 11:29 10/14/2016 16:44 10/14/2016 20:18 10/15/2016 07:26  Glucose-Capillary Latest Ref Range: 65 - 99 mg/dL 116 (H) 316 (H) 249 (H) 162 (H) 119 (H)    Diabetes history: DM2 Outpatient Diabetes medications: Glumetza 1000 mg BID Current orders for Inpatient glycemic control: Novolog 0-15 units TID with meals, Novolog 0-5 units QHS, Metformin 500 mg BID * steroids with breakfast  Inpatient Diabetes Program Recommendations: While inpatient and ordered steroids, please consider ordering Novolog 3 units TID with meals for meal coverage if patient is eating at least 50% of meals.  Gentry Fitz, RN, BA, MHA, CDE Diabetes Coordinator Inpatient Diabetes Program  [phone] (Team Pager) [phone] ([place]) 10/15/2016 8:54 AM
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
## 2016-10-16 LAB — GLUCOSE, CAPILLARY
GLUCOSE-CAPILLARY: 278 mg/dL — AB (ref 65–99)
GLUCOSE-CAPILLARY: 128 mg/dL — AB (ref 65–99)
GLUCOSE-CAPILLARY: 162 mg/dL — AB (ref 65–99)
Glucose-Capillary: 124 mg/dL — ABNORMAL HIGH (ref 65–99)

## 2016-10-16 MED ORDER — OXYCODONE HCL ER 40 MG PO T12A: 40.0000 mg | EXTENDED_RELEASE_TABLET | Freq: Two times a day (BID) | ORAL | 0 refills | Status: AC

## 2016-10-16 MED ORDER — LACTULOSE 10 GM/15ML PO SOLN
30.0000 g | Freq: Once | ORAL | Status: AC
  Administered 2016-10-16: 12:00:00 30 g via ORAL
  Filled 2016-10-16: qty 60

## 2016-10-16 MED ORDER — CLONAZEPAM 1 MG PO TABS: 1.0000 mg | ORAL_TABLET | Freq: Two times a day (BID) | ORAL | 0 refills | Status: AC

## 2016-10-16 MED ORDER — OXYCODONE HCL 10 MG PO TABS: 10.0000 mg | ORAL_TABLET | ORAL | 0 refills | Status: AC | PRN

## 2016-10-16 NOTE — Discharge Summary (Addendum)
[facility] NAME: Craig Wright    MR#:  [id number]  DATE OF BIRTH:  01-08-1966  DATE OF ADMISSION:  10/11/2016 ADMITTING PHYSICIAN: Harvie Bridge, DO  DATE OF DISCHARGE: 10/21/2016  PRIMARY CARE PHYSICIAN: Marygrace Drought, MD   ADMISSION DIAGNOSIS:  Cancer associated pain [G89.3]  DISCHARGE DIAGNOSIS:  Active Problems:   Intractable back pain   Cancer associated pain   Lung cancer metastatic to bone [facility])   Hospice care   Palliative care status   SECONDARY DIAGNOSIS:   Past Medical History:  Diagnosis Date  . A-fib ([place])   . Anxiety disorder   . Asthma   . CHF (congestive heart failure) ([place])   . COPD (chronic obstructive pulmonary disease) ([place])   . Coronary artery disease   . Diabetes mellitus without complication ([place])   . Hypertension      ADMITTING HISTORY  HPI: Craig Wright is a 51 y.o. 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  Patient was in a usual state of health until today when he was sitting on the toilet and felt a pop in his left mid back.Marland Kitchen He reports worsening of his shortness of breath because of pain with deep inspiration.   Patient denies fevers/chills, weakness, dizziness, chest pain, N/V/C/D, abdominal pain, dysuria/frequency, changes in mental status.    Otherwise there has been no change in status. Patient has been taking medication as prescribed and there has been no recent change in medication or diet.  No recent antibiotics.  There has been no recent illness, hospitalizations, travel or sick contacts.    EMS/ED Course: Patient received morphine and Dilaudid as well as Zofran. Medical admission has been requested for further management of both his cancer related pain as  well as pain related to a lytic lesion at the left pedicle of T10 which may be also responsible for the patient's.Marland Kitchen  HOSPITAL COURSE:   This is a 51 y.o.malewith a history of atrial fibrillation, asthma, COPD, congestive heart failure, coronary artery disease, diabetes,chronic hep C hypertension, lung cancer with metastasis to the [place] being admitted with:  # Severe back pain due to lytic lesions of T7 and T10. Also involved are left eighth rib and left iliac bone from metastasis of lung cancer. MRI of the thoracolumbar spine recommended by neurosurgery. Patient refused an MRI at this time and does not want any surgeries or kyphoplasty. Oxycontin 40mg  BID. Oxycodone 10mg  Q 4 hr PRN.  #Acute on chronic COPD exacerbation with acute on chronic respiratory failure Resolved. Feels he is back to baseline. Stopped prednisone Nebs PRN  #. Lung cancer with metastatic disease Prognosis poor. Will need hospice following after discharge  #. History of hypertension and atrial fibrillation -Continue Cardizem  #. 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 Tobacco Use disorder Patient counseled to quit smoking earlier this admission  # Constipation Had small BMs. Continue to take miralax and senna after discharge.  #DO NOT RESUSCITATE/Palliative care status   Patient will need palliative care following at the SNF. He will need transition to comfort care if any worsening. He doesn't want to treated aggressively at this point.  stable for discharge  CONSULTS OBTAINED:  Treatment Team:  Bayard Hugger, MD  DRUG ALLERGIES:   Allergies  Allergen Reactions  . Tape Rash    DISCHARGE MEDICATIONS:   Current Discharge Medication List    CONTINUE these medications which have CHANGED   Details  clonazePAM (KLONOPIN) 1 MG tablet Take 1 tablet (1 mg total) by mouth 2 (two)  times daily. Qty: 10 tablet, Refills: 0    oxyCODONE (OXYCONTIN) 40 mg 12 hr tablet Take 1 tablet (40 mg total) by mouth every 12 (twelve) hours. Qty: 10 tablet, Refills: 0    Oxycodone HCl 10 MG TABS Take 1 tablet (10 mg total) by mouth every 4 (four) hours as needed. 8am, 12n, 4pm, 8pm Qty: 20 tablet, Refills: 0    polyethylene glycol (MIRALAX / GLYCOLAX) packet Take 17 g by mouth daily. Qty: 14 each, Refills: 0      CONTINUE these medications which have NOT CHANGED   Details  acetaminophen (TYLENOL) 325 MG tablet Take 650 mg by mouth every 4 (four) hours as needed (for pain/fever).     buPROPion (WELLBUTRIN SR) 100 MG 12 hr tablet Take 100 mg by mouth every morning.    diltiazem (CARDIZEM CD) 240 MG 24 hr capsule Take 1 capsule (240 mg total) by mouth daily. Qty: 30 capsule, Refills: 0    docusate sodium (COLACE) 50 MG capsule Take 2 capsules (100 mg total) by mouth daily as needed for mild constipation. Qty: 10 capsule, Refills: 0   Associated Diagnoses: Malignant neoplasm of lower lobe of right lung ([place]); Encounter for antineoplastic chemotherapy    DULoxetine (CYMBALTA) 60 MG capsule Take 60 mg by mouth daily.     furosemide (LASIX) 40 MG tablet Take 40 mg by mouth daily.     guaifenesin (COUGH SYRUP) 100 MG/5ML syrup Take 200 mg by mouth.   Associated Diagnoses: Malignant neoplasm of lower lobe of right lung ([place]); Pneumonia of left lung due to infectious organism, unspecified part of lung    Ipratropium-Albuterol (COMBIVENT) 20-100 MCG/ACT AERS respimat Inhale 1 puff into the lungs every 4 (four) hours as needed for wheezing.     ipratropium-albuterol (DUONEB) 0.5-2.5 (3) MG/3ML SOLN Take 3 mLs by nebulization every 4 (four) hours as needed (for wheezing/shortness of breath).     magnesium oxide (MAG-OX) 400 MG tablet Take 1 tablet (400 mg total) by mouth daily. Qty: 30 tablet, Refills: 2    megestrol (MEGACE) 40 MG/ML suspension TAKE 1 TEASPOONFUL (12ml) BY MOUTH ONCE  DAILY. Qty: 240 mL, Refills: 0   Associated Diagnoses: Malignant neoplasm of lower lobe of right lung ([place]); Weight loss; Hypomagnesemia; Chronic hepatitis C without hepatic coma ([place]); Goals of care, counseling/discussion; Encounter for antineoplastic chemotherapy    metFORMIN (GLUMETZA) 500 MG (MOD) 24 hr tablet Take 2 tablets (1,000 mg total) by mouth 2 (two) times daily. Reported on 06/26/2015 Qty: 60 tablet, Refills: 0    mirtazapine (REMERON) 45 MG tablet Take 45 mg by mouth at bedtime.    mometasone-formoterol (DULERA) 100-5 MCG/ACT AERO Inhale 2 puffs into the lungs 2 (two) times daily. Qty: 1 Inhaler, Refills: 0    nicotine (NICODERM CQ - DOSED IN MG/24 HOURS) 21 mg/24hr patch Place 1 patch (21 mg total) onto the skin daily. Qty: 28 patch, Refills: 0    ondansetron (ZOFRAN-ODT) 4 MG disintegrating tablet Take 4 mg by mouth every 8 (eight) hours as needed for nausea or vomiting.    OXYGEN Inhale 5 L into the lungs.    potassium chloride SA (K-DUR,KLOR-CON) 20 MEQ  tablet Take 20 mEq by mouth 2 (two) times daily.    roflumilast (DALIRESP) 500 MCG TABS tablet Take 500 mcg by mouth daily.    senna (SENNA-LAX) 8.6 MG tablet Take 2 tablets by mouth daily as needed for constipation.      STOP taking these medications     levofloxacin (LEVAQUIN) 500 MG tablet         Today   VITAL SIGNS:  Blood pressure 109/63, pulse (!) 107, temperature 98 F (36.7 C), resp. rate 20, height 5\' 11"  (1.803 m), weight 77.1 kg (170 lb), SpO2 97 %.  I/O:    Intake/Output Summary (Last 24 hours) at 10/21/16 0725 Last data filed at 10/21/16 0650  Gross per 24 hour  Intake              480 ml  Output              550 ml  Net              -70 ml    PHYSICAL EXAMINATION:  Physical Exam  GENERAL:  51 y.o.-year-old patient lying in the bed with no acute distress.  LUNGS: Normal breath sounds bilaterally CARDIOVASCULAR: S1, S2 normal.  ABDOMEN: Soft, non-tender, non-distended. Bowel sounds  present. NEUROLOGIC: Moves all 4 extremities. PSYCHIATRIC: The patient is alert and oriented x 3.   DATA REVIEW:   CBC  Recent Labs Lab 10/20/16 0454  WBC 6.7  HGB 10.7*  HCT 32.3*  PLT 317    Chemistries   Recent Labs Lab 10/20/16 0454  CREATININE 0.74    Cardiac Enzymes No results for input(s): TROPONINI in the last 168 hours.  Microbiology Results  Results for orders placed or performed during the hospital encounter of 12/09/15  MRSA PCR Screening     Status: None   Collection Time: 12/09/15  7:23 PM  Result Value Ref Range Status   MRSA by PCR NEGATIVE NEGATIVE Final    Comment:        The GeneXpert MRSA Assay (FDA approved for NASAL specimens only), is one component of a comprehensive MRSA colonization surveillance program. It is not intended to diagnose MRSA infection nor to guide or monitor treatment for MRSA infections.     RADIOLOGY:  No results found.  Follow up with PCP in 1 week.  Management plans discussed with the patient, family and they are in agreement.  CODE STATUS:     Code Status Orders        Start     Ordered   10/12/16 1505  Do not attempt resuscitation (DNR)  Continuous    Question Answer Comment  In the event of cardiac or respiratory ARREST Do not call a "code blue"   In the event of cardiac or respiratory ARREST Do not perform Intubation, CPR, defibrillation or ACLS   In the event of cardiac or respiratory ARREST Use medication by any route, position, wound care, and other measures to relive pain and suffering. May use oxygen, suction and manual treatment of airway obstruction as needed for comfort.      10/12/16 1505    Code Status History    Date Active Date Inactive Code Status Order ID Comments User Context   10/12/2016  1:33 AM 10/12/2016  3:05 PM Full Code [id number]  HugelmeyerUbaldo Glassing, DO Inpatient   10/12/2016  1:18 AM 10/12/2016  1:33 AM DNR [id number]  Harvie Bridge, DO ED   01/23/2016 10:03 PM 01/26/2016   2:54 PM DNR [id number]  Fritzi Mandes, MD Inpatient   12/09/2015 11:40 AM 12/12/2015  4:07 PM Full Code [id number]  Epifanio Lesches, MD ED   10/24/2015  1:10 PM 10/27/2015  2:10 PM DNR [id number]  Gladstone Lighter, MD ED   10/20/2015 10:51 AM 10/23/2015  4:37 PM DNR [id number]  Laverle Hobby, MD Inpatient   10/19/2015  8:55 AM 10/20/2015 10:51 AM Full Code [id number]  Dustin Flock, MD Inpatient   10/18/2015 12:29 PM 10/19/2015  8:55 AM DNR [id number]  Laverle Hobby, MD Inpatient   10/18/2015  8:53 AM 10/18/2015 12:29 PM Full Code [id number]  Loletha Grayer, MD Inpatient   10/15/2015  4:43 PM 10/18/2015  8:53 AM DNR [id number]  Lytle Butte, MD ED   10/15/2015  4:43 PM 10/15/2015  4:43 PM Full Code [id number]  Hower, Aaron Mose, MD ED   06/26/2015 10:13 AM 06/28/2015  6:05 PM DNR [id number]  Bettey Costa, MD Inpatient   06/26/2015  6:05 AM 06/26/2015 10:13 AM Full Code [id number]  Saundra Shelling, MD Inpatient   06/08/2015 12:15 AM 06/10/2015  4:32 PM Full Code [id number]  Saundra Shelling, MD Inpatient    Advance Directive Documentation     Most Recent Value  Type of Advance Directive  Healthcare Power of [place], Living will  Pre-existing out of facility DNR order (yellow form or pink MOST form)  -  "MOST" Form in Place?  -      TOTAL TIME TAKING CARE OF THIS PATIENT ON DAY OF DISCHARGE: more than 30 minutes.   Hillary Bow R M.D on 10/21/2016 at 7:25 AM  Between 7am to 6pm - Pager - [phone]  After 6pm go to [email or web address] - password [email or web address] [facility]  Office  [phone]  CC: Primary care physician; Marygrace Drought, MD  Note: This dictation was prepared with Dragon dictation along with smaller phrase technology. Any transcriptional errors that result from this process are unintentional.

## 2016-10-16 NOTE — Plan of Care (Signed)
Gave pt PRN pain medication q4 hours and patient seemed much more comfortable today and rested.  Appetite was improved.  Still waiting on PASSAR and bed placement.

## 2016-10-16 NOTE — Progress Notes (Signed)
[place] at [facility]                                                                                                                                                                                  Patient Demographics   Craig Wright, is a 51 y.o. male, DOB - 1965-04-01, ENI:[id number]  Admit date - 10/11/2016   Admitting Physician Harvie Bridge, DO  Outpatient Primary MD for the patient is Marygrace Drought, MD   LOS - 2  Subjective:  Pain better controlled with oxycontin and oxycodone. Constipated  Mother at bedside   Review of Systems:   CONSTITUTIONAL: No documented fever. No fatigue, weakness. No weight gain, no weight loss.  EYES: No blurry or double vision.  ENT: No tinnitus. No postnasal drip. No redness of the oropharynx.  RESPIRATORY:Positive cough, positive wheeze, no hemoptysis. Positive dyspnea.  CARDIOVASCULAR: No chest pain. No orthopnea. No palpitations. No syncope.  GASTROINTESTINAL: No nausea, no vomiting or diarrhea. No abdominal pain. No melena or hematochezia.  GENITOURINARY: No dysuria or hematuria.  ENDOCRINE: No polyuria or nocturia. No heat or cold intolerance.  HEMATOLOGY: No anemia. No bruising. No bleeding.  INTEGUMENTARY: No rashes. No lesions.  MUSCULOSKELETAL: No arthritis. No swelling. No gout. Pain all over NEUROLOGIC: No numbness, tingling, or ataxia. No seizure-type activity.  PSYCHIATRIC: No anxiety. No insomnia. No ADD.    Vitals:   Vitals:   10/15/16 1315 10/15/16 2056 10/16/16 0505 10/16/16 1012  BP: 113/66 120/65 127/71 129/75  Pulse: (!) 120 (!) 109 (!) 117 (!) 123  Resp:  20 (!) 21   Temp: 98.5 F (36.9 C) 98.4 F (36.9 C) 98.3 F (36.8 C)   TempSrc: Oral Oral Oral   SpO2: 96% 96% 92%   Weight:      Height:        Wt Readings from Last 3 Encounters:  10/11/16 77.1 kg (170 lb)  10/09/16 78 kg (172 lb)  09/28/16 79.2 kg (174 lb 9 oz)     Intake/Output Summary (Last 24 hours) at  10/16/16 1122 Last data filed at 10/16/16 0955  Gross per 24 hour  Intake              360 ml  Output              850 ml  Net             -490 ml    Physical Exam:   GENERAL: Pleasant-appearing in no apparent distress.  HEAD, EYES, EARS, NOSE AND THROAT: Atraumatic, normocephalic. Extraocular muscles are intact. Pupils equal and reactive to light. Sclerae anicteric. No conjunctival injection. No oro-pharyngeal erythema.  NECK:  Supple. There is no jugular venous distention. No bruits, no lymphadenopathy, no thyromegaly.  HEART: Regular rate and rhythm,. No murmurs, no rubs, no clicks.  LUNGS: Wheezing and rhonchus breath sounds on the right lung, left lung occasional wheezing  ABDOMEN: Soft, flat, nontender, nondistended. Has good bowel sounds. No hepatosplenomegaly appreciated.  EXTREMITIES: No evidence of any cyanosis, clubbing, or peripheral edema.  +2 pedal and radial pulses bilaterally.  NEUROLOGIC: The patient is alert, awake, and oriented x3 with no focal motor or sensory deficits appreciated bilaterally.  SKIN: Moist and warm with no rashes appreciated.  Psych: Not anxious.  LN: No inguinal LN enlargement  Antibiotics   Anti-infectives    Start     Dose/Rate Route Frequency Ordered Stop   10/12/16 1230  azithromycin (ZITHROMAX) tablet 500 mg     500 mg Oral Daily 10/12/16 1133 10/14/16 1002      Medications   Scheduled Meds: . buPROPion  100 mg Oral BH-q7a  . clonazePAM  1 mg Oral BID  . diltiazem  300 mg Oral Daily  . DULoxetine  60 mg Oral Daily  . enoxaparin (LOVENOX) injection  40 mg Subcutaneous Q24H  . feeding supplement (ENSURE ENLIVE)  237 mL Oral TID BM  . furosemide  40 mg Oral Daily  . guaiFENesin  600 mg Oral BID  . insulin aspart  0-15 Units Subcutaneous TID WC  . insulin aspart  0-5 Units Subcutaneous QHS  . lactulose  30 g Oral Once  . lidocaine  1-3 patch Transdermal Q24H  . magnesium oxide  400 mg Oral Daily  . mouth rinse  15 mL Mouth Rinse BID   . megestrol  200 mg Oral Daily  . metFORMIN  500 mg Oral BID WC  . mirtazapine  45 mg Oral QHS  . mometasone-formoterol  2 puff Inhalation BID  . nicotine  21 mg Transdermal Daily  . oxyCODONE  40 mg Oral Q12H  . polyethylene glycol  17 g Oral Daily  . potassium chloride SA  20 mEq Oral BID  . predniSONE  20 mg Oral Q breakfast  . roflumilast  500 mcg Oral Daily  . senna-docusate  1 tablet Oral BID  . sodium chloride flush  3 mL Intravenous Q12H   Continuous Infusions: . sodium chloride     PRN Meds:.sodium chloride, acetaminophen, albuterol, docusate sodium, gadobenate dimeglumine, guaifenesin, HYDROmorphone (DILAUDID) injection, ipratropium, ipratropium-albuterol, ondansetron, oxyCODONE, polyethylene glycol, senna, sodium chloride flush, traMADol   Data Review:   Micro Results No results found for this or any previous visit (from the past 240 hour(s)).  Radiology Reports Ct Thoracic Spine Wo Contrast  Result Date: 10/12/2016 CLINICAL DATA:  Acute onset of mid back pain while seated today. No acute injury. Lung cancer metastatic to bone. EXAM: CT THORACIC AND LUMBAR SPINE WITHOUT CONTRAST TECHNIQUE: Multidetector CT imaging of the thoracic and lumbar spine was performed without contrast. Multiplanar CT image reconstructions were also generated. COMPARISON:  Abdominopelvic CT 10/11/2016. PET-CT 09/23/2016. Chest CT 08/06/2016. FINDINGS: CT THORACIC SPINE FINDINGS Alignment: Normal. Vertebrae: Known lytic metastasis involving the left T10 pedicle, transverse process and lamina is grossly stable, measuring up to 2.6 x 1.8 cm on image 120. In correlation with prior PET-CT, there are probable metastases involving the T7 spinous process and left eighth rib near the costovertebral junction. No definite new lesions. No evidence of pathologic fracture or epidural tumor. Paraspinal and other soft tissues: No paraspinal masses are seen. Right infrahilar mass, patchy right basilar airspace disease  and a right pleural effusion appears similar to recent abdominal CT. Disc levels: Mild degenerative changes throughout the spine with mild endplate degeneration. No large disc herniation, spinal stenosis or nerve root encroachment seen. CT LUMBAR SPINE FINDINGS Segmentation: Normal. Alignment: Normal. Vertebrae: No lytic or enlarging blastic lesions are seen within the lumbar spine. There is a stable small sclerotic lesion in the right aspect of the L4 vertebral body, likely benign. There is a large lytic lesion within the left iliac bone abutting the sacroiliac joint, incompletely visualized, although grossly stable from prior studies. No evidence of pathologic fracture. Paraspinal and other soft tissues: No paraspinal masses are seen. Enlarged right adrenal gland again noted, consistent with a metastasis. There is aortic and branch vessel atherosclerosis. Disc levels: No significant disc space findings are seen within the lumbar spine at or above L3-4. L4-5: Loss of disc height with annular disc bulging and a probable left paracentral disc extrusion. There is inferior migration of disc material, mass effect on the thecal sac and probable left L5 nerve root encroachment. There is no foraminal compromise or L4 nerve root encroachment. L5-S1:  Normal interspace. IMPRESSION: 1. Lytic lesions involving the T7 spinous process, left eighth rib, left aspect of the T10 posterior elements and left iliac bone are similar to the recent prior studies and consistent with metastatic lung cancer. No enlarging lesions, pathologic fracture or epidural tumor seen. 2. Disc extrusion on the left at L4-5 may be symptomatic on the basis of left L5 nerve root encroachment in the canal. 3. Grossly stable findings of metastatic lung cancer in the right hemithorax and right adrenal metastasis. Electronically Signed   By: Richardean Sale M.D.   On: 10/12/2016 15:03   Ct Lumbar Spine Wo Contrast  Result Date: 10/12/2016 CLINICAL DATA:   Acute onset of mid back pain while seated today. No acute injury. Lung cancer metastatic to bone. EXAM: CT THORACIC AND LUMBAR SPINE WITHOUT CONTRAST TECHNIQUE: Multidetector CT imaging of the thoracic and lumbar spine was performed without contrast. Multiplanar CT image reconstructions were also generated. COMPARISON:  Abdominopelvic CT 10/11/2016. PET-CT 09/23/2016. Chest CT 08/06/2016. FINDINGS: CT THORACIC SPINE FINDINGS Alignment: Normal. Vertebrae: Known lytic metastasis involving the left T10 pedicle, transverse process and lamina is grossly stable, measuring up to 2.6 x 1.8 cm on image 120. In correlation with prior PET-CT, there are probable metastases involving the T7 spinous process and left eighth rib near the costovertebral junction. No definite new lesions. No evidence of pathologic fracture or epidural tumor. Paraspinal and other soft tissues: No paraspinal masses are seen. Right infrahilar mass, patchy right basilar airspace disease and a right pleural effusion appears similar to recent abdominal CT. Disc levels: Mild degenerative changes throughout the spine with mild endplate degeneration. No large disc herniation, spinal stenosis or nerve root encroachment seen. CT LUMBAR SPINE FINDINGS Segmentation: Normal. Alignment: Normal. Vertebrae: No lytic or enlarging blastic lesions are seen within the lumbar spine. There is a stable small sclerotic lesion in the right aspect of the L4 vertebral body, likely benign. There is a large lytic lesion within the left iliac bone abutting the sacroiliac joint, incompletely visualized, although grossly stable from prior studies. No evidence of pathologic fracture. Paraspinal and other soft tissues: No paraspinal masses are seen. Enlarged right adrenal gland again noted, consistent with a metastasis. There is aortic and branch vessel atherosclerosis. Disc levels: No significant disc space findings are seen within the lumbar spine at or above L3-4. L4-5: Loss of  disc height  with annular disc bulging and a probable left paracentral disc extrusion. There is inferior migration of disc material, mass effect on the thecal sac and probable left L5 nerve root encroachment. There is no foraminal compromise or L4 nerve root encroachment. L5-S1:  Normal interspace. IMPRESSION: 1. Lytic lesions involving the T7 spinous process, left eighth rib, left aspect of the T10 posterior elements and left iliac bone are similar to the recent prior studies and consistent with metastatic lung cancer. No enlarging lesions, pathologic fracture or epidural tumor seen. 2. Disc extrusion on the left at L4-5 may be symptomatic on the basis of left L5 nerve root encroachment in the canal. 3. Grossly stable findings of metastatic lung cancer in the right hemithorax and right adrenal metastasis. Electronically Signed   By: Richardean Sale M.D.   On: 10/12/2016 15:03   Nm Pet Image Initial (pi) Skull Base To Thigh  Result Date: 09/23/2016 CLINICAL DATA:  Subsequent treatment strategy for lung carcinoma. EXAM: NUCLEAR MEDICINE PET SKULL BASE TO THIGH TECHNIQUE: 13.4 mCi F-18 FDG was injected intravenously. Full-ring PET imaging was performed from the skull base to thigh after the radiotracer. CT data was obtained and used for attenuation correction and anatomic localization. The PET data acquisition was terminated after completing a skullbase to iliac crest data acquisition. Patient was extremely uncomfortable could not tolerate lying supine. FASTING BLOOD GLUCOSE:  Value: 119 mg/dl COMPARISON:  CT 08/06/2016, PET-CT 1198 FINDINGS: NECK No hypermetabolic lymph nodes in the neck. CHEST RIGHT lower lobe mass is similar in size and metabolic activity measuring 2.7 cm with SUV max equal 7.3 compared to 11.6 on prior. However, there are new nodules in the RIGHT lower lobe compared to most recent CT scan and PET-CT scan which are hypermetabolic. Additionally there is a new hypermetabolic nodule within the LEFT  lung upper lobe measuring 10 mm (image 96, series 3) with SUV max equal 4.1. Second lingular nodule is hypermetabolic on image 500. There is new hypermetabolic mediastinal lymphadenopathy. For example subcarinal lymph node with SUV max equals 6.0. RIGHT paratracheal nodes which are hypermetabolic. RIGHT hilar lymph node is newly enlarged and hypermetabolic. Prevascular lymph nodes are hypermetabolic with SUV max equal 3.9. ABDOMEN/PELVIS There is new enlargement of the RIGHT adrenal gland which is hypermetabolic with SUV max equals 7.1. The more inferior abdomen and pelvis is not imaged by PET imaging.imaging. Defect within the RIGHT iliac wing is favored related to prior bone harvesting. SKELETON New hypermetabolic lesion in the X38 pedicle on the LEFT (image 142, series 3) with SUV max equal 6.5. New rib lesion in T8 on the LEFT adjacent to the transverse process with SUV max equal 4.6. IMPRESSION: 1. Lung cancer progression with new hypermetabolic RIGHT lower lobe and LEFT upper lobe pulmonary nodules. 2. New hypermetabolic mediastinal lymph nodes consistent metastasis. 3. Hypermetabolic RIGHT adrenal gland metastasis. 4. New skeletal metastasis to the T10 vertebral body and LEFT rib. T 5. Inferior abdomen and pelvis not imaged by PET due to patient discomfort. Electronically Signed   By: Suzy Bouchard M.D.   On: 09/23/2016 13:51   Ct Renal Stone Study  Result Date: 10/11/2016 CLINICAL DATA:  Acute left flank pain.  Known lung cancer. EXAM: CT ABDOMEN AND PELVIS WITHOUT CONTRAST TECHNIQUE: Multidetector CT imaging of the abdomen and pelvis was performed following the standard protocol without IV contrast. COMPARISON:  PET-CT 09/23/2016 FINDINGS: Lower chest: Pulmonary masses are seen within the right middle lobe, right lower lobe and left lower lobe, consistent with  known metastatic lung cancer. There is a small right pleural effusion. Small pericardial effusion. Hepatobiliary: No focal liver abnormality  is seen. No gallstones, gallbladder wall thickening, or biliary dilatation. Pancreas: Unremarkable. No pancreatic ductal dilatation or surrounding inflammatory changes. Spleen: Normal in size without focal abnormality. Adrenals/Urinary Tract: Known metastatic right adrenal mass measures 18 mm in cross-section. No evidence of nephrolithiasis, hydronephrosis or obstructive uropathy. The urinary bladder is normal, although partially obscured by beam hardening artifact from left hip prosthesis. Stomach/Bowel: Stomach is within normal limits. Appendix appears normal. No evidence of bowel wall thickening, distention, or inflammatory changes. Vascular/Lymphatic: Calcific atherosclerotic disease of the abdominal aorta. Numerous mildly enlarged retroperitoneal lymph nodes centered about the takeoff of the renal arteries. Reproductive: Prostate is unremarkable. Other: No abdominal wall hernia or abnormality. No abdominopelvic ascites. Musculoskeletal: 10 mm sclerotic focus within the superior aspect of L4 vertebral body. The previously demonstrated by PET-CT metastatic deposit within T10 vertebral body corresponds to a lytic lesion within the left pedicle, axial image 6/91 sequence 2 and sagittal image 101/165, sequence 6. There is a small soft tissue component to this lesion. Large permeative lytic lesion within the left ilium, bordering the SI joint measures 5.3 cm. IMPRESSION: No evidence of obstructive uropathy or nephrolithiasis. Pulmonary masses within the right middle lobe, right lower lobe, left lower lobe, consistent with known metastatic lung cancer. Small to moderate right pleural effusion. Small pericardial effusion. Known metastatic right adrenal mass. Numerous mildly enlarged retroperitoneal upper abdominal lymph nodes, likely metastatic. T10 left pedicle metastatic lytic lesion. Given its proximity to the osseous neural foramen, it may have caused patient's symptoms. Large permeative lytic lesion within the  left ilium causing cortical destruction, probably also metastatic. Electronically Signed   By: Fidela Salisbury M.D.   On: 10/11/2016 21:11     CBC  Recent Labs Lab 10/11/16 2032 10/12/16 0346  WBC 6.1 5.3  HGB 10.6* 10.2*  HCT 31.6* 30.5*  PLT 355 316  MCV 84.3 84.4  MCH 28.3 28.1  MCHC 33.5 33.3  RDW 14.4 14.5    Chemistries   Recent Labs Lab 10/11/16 2032 10/12/16 0346  NA 137 138  K 4.1 4.1  CL 100* 105  CO2 29 28  GLUCOSE 194* 121*  BUN 14 10  CREATININE 0.72 0.59*  CALCIUM 8.3* 8.1*  AST 38  --   ALT 35  --   ALKPHOS 74  --   BILITOT 0.2*  --    ------------------------------------------------------------------------------------------------------------------ estimated creatinine clearance is 115 mL/min (A) (by C-G formula based on SCr of 0.59 mg/dL (L)). ------------------------------------------------------------------------------------------------------------------ No results for input(s): HGBA1C in the last 72 hours. ------------------------------------------------------------------------------------------------------------------ No results for input(s): CHOL, HDL, LDLCALC, TRIG, CHOLHDL, LDLDIRECT in the last 72 hours. ------------------------------------------------------------------------------------------------------------------ No results for input(s): TSH, T4TOTAL, T3FREE, THYROIDAB in the last 72 hours.  Invalid input(s): FREET3 ------------------------------------------------------------------------------------------------------------------ No results for input(s): VITAMINB12, FOLATE, FERRITIN, TIBC, IRON, RETICCTPCT in the last 72 hours.  Coagulation profile No results for input(s): INR, PROTIME in the last 168 hours.  No results for input(s): DDIMER in the last 72 hours.  Cardiac Enzymes No results for input(s): CKMB, TROPONINI, MYOGLOBIN in the last 168 hours.  Invalid input(s):  CK ------------------------------------------------------------------------------------------------------------------ Invalid input(s): [place]   This is a 51 y.o. male with a history of atrial fibrillation, asthma, COPD, congestive heart failure, coronary artery disease, diabetes,chronic hep C hypertension, lung cancer with metastasis to the bone now being admitted with:  # Severe back pain due  to lytic lesions of T7 and T10. Also involved are left eighth rib and left iliac bone from metastasis of lung cancer. MRI of the thoracolumbar spine recommended by neurosurgery. Patient refused an MRI at this time and does not want any surgeries or kyphoplasty. Oxycontin 40mg  BID. Oxycodone 10mg  Q 4 hr PRN.  #Acute on chronic COPD exacerbation with acute on chronic respiratory failure Improved. Feels he is back to baseline. Reduce prednisone Nebs PRN  #. Lung cancer with metastatic disease Prognosis poor. Will need hospice following after discharge  #. History of hypertension and atrial fibrillation -Continue Cardizem  #. 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 Tobacco Use disorder Patient counseled to quit smoking earlier this admission  #DO NOT RESUSCITATE status     Code Status Orders        Start     Ordered   10/12/16 0134  Full code  Continuous     10/12/16 0133    Code Status History    Date Active Date Inactive Code Status Order ID Comments User Context   10/12/2016  1:18 AM 10/12/2016  1:33 AM DNR [id number]  Harvie Bridge, DO ED   01/23/2016 10:03 PM 01/26/2016  2:54 PM DNR [id number]  Fritzi Mandes, MD Inpatient   12/09/2015 11:40 AM 12/12/2015  4:07 PM Full Code [id number]  Epifanio Lesches, MD ED   10/24/2015  1:10 PM 10/27/2015  2:10 PM DNR [id number]  Gladstone Lighter, MD ED   10/20/2015 10:51 AM 10/23/2015  4:37 PM DNR [id number]   Laverle Hobby, MD Inpatient   10/19/2015  8:55 AM 10/20/2015 10:51 AM Full Code [id number]  Dustin Flock, MD Inpatient   10/18/2015 12:29 PM 10/19/2015  8:55 AM DNR [id number]  Laverle Hobby, MD Inpatient   10/18/2015  8:53 AM 10/18/2015 12:29 PM Full Code [id number]  Loletha Grayer, MD Inpatient   10/15/2015  4:43 PM 10/18/2015  8:53 AM DNR [id number]  Lytle Butte, MD ED   10/15/2015  4:43 PM 10/15/2015  4:43 PM Full Code [id number]  Lytle Butte, MD ED   06/26/2015 10:13 AM 06/28/2015  6:05 PM DNR [id number]  Bettey Costa, MD Inpatient   06/26/2015  6:05 AM 06/26/2015 10:13 AM Full Code [id number]  Saundra Shelling, MD Inpatient   06/08/2015 12:15 AM 06/10/2015  4:32 PM Full Code [id number]  Saundra Shelling, MD Inpatient    Advance Directive Documentation     Most Recent Value  Type of Advance Directive  Healthcare Power of [place], Living will  Pre-existing out of facility DNR order (yellow form or pink MOST form)  -  "MOST" Form in Place?  -      Consults  neurosurgery  DVT Prophylaxis  Lovenox    Lab Results  Component Value Date   PLT 316 10/12/2016   Time Spent in minutes   25 min   Hillary Bow R M.D on 10/16/2016 at 11:22 AM  Between 7am to 6pm - Pager - [phone]  After 6pm go to [email or web address] - password [email or web address] [place] [facility]   Office  [phone]

## 2016-10-16 NOTE — Clinical Social Work Note (Signed)
CSW updated MD that PASARR is pending. CSW will continue to follow.   Darden Dates, MSW, LCSW  Clinical Social Worker  [phone]

## 2016-10-17 LAB — GLUCOSE, CAPILLARY
GLUCOSE-CAPILLARY: 256 mg/dL — AB (ref 65–99)
GLUCOSE-CAPILLARY: 142 mg/dL — AB (ref 65–99)
Glucose-Capillary: 101 mg/dL — ABNORMAL HIGH (ref 65–99)
Glucose-Capillary: 178 mg/dL — ABNORMAL HIGH (ref 65–99)

## 2016-10-17 MED ORDER — LACTULOSE 10 GM/15ML PO SOLN
30.0000 g | Freq: Once | ORAL | Status: AC
  Administered 2016-10-17: 20 g via ORAL
  Filled 2016-10-17: qty 60

## 2016-10-17 MED ORDER — MAGNESIUM CITRATE PO SOLN
1.0000 | Freq: Once | ORAL | Status: AC
  Administered 2016-10-17: 11:00:00 1 via ORAL
  Filled 2016-10-17: qty 296

## 2016-10-17 MED ORDER — METHYLNALTREXONE BROMIDE 12 MG/0.6ML ~~LOC~~ SOLN
12.0000 mg | Freq: Once | SUBCUTANEOUS | Status: AC
  Administered 2016-10-17: 12 mg via SUBCUTANEOUS
  Filled 2016-10-17: qty 0.6

## 2016-10-17 NOTE — Progress Notes (Addendum)
No BM since at least 10/11/16 with MD made aware. Pt refuses suppository/fleets enema. Refused partial dose of lactulose and took 3/4 miralax scheduled does this a.m, only took sip mag citrate. Abdomen distended/firm. Meds given as ordered and that pt will take. Rates pain 6-7/10 in back and bilateral hips with oxycontin/oxycodone/tramadol. Sleeping at intervals. Mood irritable, angry and "don't want to be bothered". Will try to organize rounds to diminish pt interruptions. Pt has had 1 small BM since laxatives with subq injection. Pt refused back brace and K pad.

## 2016-10-17 NOTE — Progress Notes (Signed)
[place] at [facility]                                                                                                                                                                                  Patient Demographics   Craig Wright, is a 51 y.o. male, DOB - [birth year], BTD:[id number]  Admit date - 10/11/2016   Admitting Physician Harvie Bridge, DO  Outpatient Primary MD for the patient is Marygrace Drought, MD   LOS - 3  Subjective:  Pain better controlled with oxycontin and oxycodone. Constipated   Review of Systems:   CONSTITUTIONAL: No documented fever. No fatigue, weakness. No weight gain, no weight loss.  EYES: No blurry or double vision.  ENT: No tinnitus. No postnasal drip. No redness of the oropharynx.  RESPIRATORY:Positive cough, positive wheeze, no hemoptysis. Positive dyspnea.  CARDIOVASCULAR: No chest pain. No orthopnea. No palpitations. No syncope.  GASTROINTESTINAL: No nausea, no vomiting or diarrhea. No abdominal pain. No melena or hematochezia.  GENITOURINARY: No dysuria or hematuria.  ENDOCRINE: No polyuria or nocturia. No heat or cold intolerance.  HEMATOLOGY: No anemia. No bruising. No bleeding.  INTEGUMENTARY: No rashes. No lesions.  MUSCULOSKELETAL: No arthritis. No swelling. No gout. Pain all over NEUROLOGIC: No numbness, tingling, or ataxia. No seizure-type activity.  PSYCHIATRIC: No anxiety. No insomnia. No ADD.    Vitals:   Vitals:   10/16/16 1258 10/16/16 2048 10/17/16 0508 10/17/16 0814  BP: 120/78 119/80 131/81 132/81  Pulse: (!) 118 (!) 109 (!) 111 (!) 117  Resp: 16 20 20 20   Temp: 98.5 F (36.9 C) 98.2 F (36.8 C) (!) 97.4 F (36.3 C) 98.1 F (36.7 C)  TempSrc: Oral Oral Oral Oral  SpO2: 99% 98% 98% 97%  Weight:      Height:        Wt Readings from Last 3 Encounters:  10/11/16 77.1 kg (170 lb)  10/09/16 78 kg (172 lb)  09/28/16 79.2 kg (174 lb 9 oz)     Intake/Output Summary (Last 24 hours)  at 10/17/16 1315 Last data filed at 10/17/16 1000  Gross per 24 hour  Intake              720 ml  Output             1725 ml  Net            -1005 ml    Physical Exam:   GENERAL: Pleasant-appearing in no apparent distress.  HEAD, EYES, EARS, NOSE AND THROAT: Atraumatic, normocephalic. Extraocular muscles are intact. Pupils equal and reactive to light. Sclerae anicteric. No conjunctival injection. No oro-pharyngeal erythema.  NECK: Supple. There is  no jugular venous distention. No bruits, no lymphadenopathy, no thyromegaly.  HEART: Regular rate and rhythm,. No murmurs, no rubs, no clicks.  LUNGS: Wheezing and rhonchus breath sounds on the right lung, left lung occasional wheezing  ABDOMEN: Soft, flat, nontender, nondistended. Has good bowel sounds. No hepatosplenomegaly appreciated.  EXTREMITIES: No evidence of any cyanosis, clubbing, or peripheral edema.  +2 pedal and radial pulses bilaterally.  NEUROLOGIC: The patient is alert, awake, and oriented x3 with no focal motor or sensory deficits appreciated bilaterally.  SKIN: Moist and warm with no rashes appreciated.  Psych: Not anxious.  LN: No inguinal LN enlargement  Antibiotics   Anti-infectives    Start     Dose/Rate Route Frequency Ordered Stop   10/12/16 1230  azithromycin (ZITHROMAX) tablet 500 mg     500 mg Oral Daily 10/12/16 1133 10/14/16 1002      Medications   Scheduled Meds: . buPROPion  100 mg Oral BH-q7a  . clonazePAM  1 mg Oral BID  . diltiazem  300 mg Oral Daily  . DULoxetine  60 mg Oral Daily  . feeding supplement (ENSURE ENLIVE)  237 mL Oral TID BM  . furosemide  40 mg Oral Daily  . guaiFENesin  600 mg Oral BID  . insulin aspart  0-15 Units Subcutaneous TID WC  . insulin aspart  0-5 Units Subcutaneous QHS  . lidocaine  1-3 patch Transdermal Q24H  . magnesium oxide  400 mg Oral Daily  . mouth rinse  15 mL Mouth Rinse BID  . megestrol  200 mg Oral Daily  . metFORMIN  500 mg Oral BID WC  . mirtazapine  45  mg Oral QHS  . mometasone-formoterol  2 puff Inhalation BID  . nicotine  21 mg Transdermal Daily  . oxyCODONE  40 mg Oral Q12H  . polyethylene glycol  17 g Oral Daily  . potassium chloride SA  20 mEq Oral BID  . roflumilast  500 mcg Oral Daily  . senna-docusate  1 tablet Oral BID  . sodium chloride flush  3 mL Intravenous Q12H   Continuous Infusions: . sodium chloride     PRN Meds:.sodium chloride, acetaminophen, albuterol, docusate sodium, gadobenate dimeglumine, guaifenesin, HYDROmorphone (DILAUDID) injection, ipratropium, ipratropium-albuterol, ondansetron, oxyCODONE, polyethylene glycol, senna, sodium chloride flush, traMADol   Data Review:   Micro Results No results found for this or any previous visit (from the past 240 hour(s)).  Radiology Reports Ct Thoracic Spine Wo Contrast  Result Date: 10/12/2016 CLINICAL DATA:  Acute onset of mid back pain while seated today. No acute injury. Lung cancer metastatic to bone. EXAM: CT THORACIC AND LUMBAR SPINE WITHOUT CONTRAST TECHNIQUE: Multidetector CT imaging of the thoracic and lumbar spine was performed without contrast. Multiplanar CT image reconstructions were also generated. COMPARISON:  Abdominopelvic CT 10/11/2016. PET-CT 09/23/2016. Chest CT 08/06/2016. FINDINGS: CT THORACIC SPINE FINDINGS Alignment: Normal. Vertebrae: Known lytic metastasis involving the left T10 pedicle, transverse process and lamina is grossly stable, measuring up to 2.6 x 1.8 cm on image 120. In correlation with prior PET-CT, there are probable metastases involving the T7 spinous process and left eighth rib near the costovertebral junction. No definite new lesions. No evidence of pathologic fracture or epidural tumor. Paraspinal and other soft tissues: No paraspinal masses are seen. Right infrahilar mass, patchy right basilar airspace disease and a right pleural effusion appears similar to recent abdominal CT. Disc levels: Mild degenerative changes throughout the  spine with mild endplate degeneration. No large disc herniation, spinal stenosis or  nerve root encroachment seen. CT LUMBAR SPINE FINDINGS Segmentation: Normal. Alignment: Normal. Vertebrae: No lytic or enlarging blastic lesions are seen within the lumbar spine. There is a stable small sclerotic lesion in the right aspect of the L4 vertebral body, likely benign. There is a large lytic lesion within the left iliac bone abutting the sacroiliac joint, incompletely visualized, although grossly stable from prior studies. No evidence of pathologic fracture. Paraspinal and other soft tissues: No paraspinal masses are seen. Enlarged right adrenal gland again noted, consistent with a metastasis. There is aortic and branch vessel atherosclerosis. Disc levels: No significant disc space findings are seen within the lumbar spine at or above L3-4. L4-5: Loss of disc height with annular disc bulging and a probable left paracentral disc extrusion. There is inferior migration of disc material, mass effect on the thecal sac and probable left L5 nerve root encroachment. There is no foraminal compromise or L4 nerve root encroachment. L5-S1:  Normal interspace. IMPRESSION: 1. Lytic lesions involving the T7 spinous process, left eighth rib, left aspect of the T10 posterior elements and left iliac bone are similar to the recent prior studies and consistent with metastatic lung cancer. No enlarging lesions, pathologic fracture or epidural tumor seen. 2. Disc extrusion on the left at L4-5 may be symptomatic on the basis of left L5 nerve root encroachment in the canal. 3. Grossly stable findings of metastatic lung cancer in the right hemithorax and right adrenal metastasis. Electronically Signed   By: Richardean Sale M.D.   On: 10/12/2016 15:03   Ct Lumbar Spine Wo Contrast  Result Date: 10/12/2016 CLINICAL DATA:  Acute onset of mid back pain while seated today. No acute injury. Lung cancer metastatic to bone. EXAM: CT THORACIC AND  LUMBAR SPINE WITHOUT CONTRAST TECHNIQUE: Multidetector CT imaging of the thoracic and lumbar spine was performed without contrast. Multiplanar CT image reconstructions were also generated. COMPARISON:  Abdominopelvic CT 10/11/2016. PET-CT 09/23/2016. Chest CT 08/06/2016. FINDINGS: CT THORACIC SPINE FINDINGS Alignment: Normal. Vertebrae: Known lytic metastasis involving the left T10 pedicle, transverse process and lamina is grossly stable, measuring up to 2.6 x 1.8 cm on image 120. In correlation with prior PET-CT, there are probable metastases involving the T7 spinous process and left eighth rib near the costovertebral junction. No definite new lesions. No evidence of pathologic fracture or epidural tumor. Paraspinal and other soft tissues: No paraspinal masses are seen. Right infrahilar mass, patchy right basilar airspace disease and a right pleural effusion appears similar to recent abdominal CT. Disc levels: Mild degenerative changes throughout the spine with mild endplate degeneration. No large disc herniation, spinal stenosis or nerve root encroachment seen. CT LUMBAR SPINE FINDINGS Segmentation: Normal. Alignment: Normal. Vertebrae: No lytic or enlarging blastic lesions are seen within the lumbar spine. There is a stable small sclerotic lesion in the right aspect of the L4 vertebral body, likely benign. There is a large lytic lesion within the left iliac bone abutting the sacroiliac joint, incompletely visualized, although grossly stable from prior studies. No evidence of pathologic fracture. Paraspinal and other soft tissues: No paraspinal masses are seen. Enlarged right adrenal gland again noted, consistent with a metastasis. There is aortic and branch vessel atherosclerosis. Disc levels: No significant disc space findings are seen within the lumbar spine at or above L3-4. L4-5: Loss of disc height with annular disc bulging and a probable left paracentral disc extrusion. There is inferior migration of disc  material, mass effect on the thecal sac and probable left L5 nerve root  encroachment. There is no foraminal compromise or L4 nerve root encroachment. L5-S1:  Normal interspace. IMPRESSION: 1. Lytic lesions involving the T7 spinous process, left eighth rib, left aspect of the T10 posterior elements and left iliac bone are similar to the recent prior studies and consistent with metastatic lung cancer. No enlarging lesions, pathologic fracture or epidural tumor seen. 2. Disc extrusion on the left at L4-5 may be symptomatic on the basis of left L5 nerve root encroachment in the canal. 3. Grossly stable findings of metastatic lung cancer in the right hemithorax and right adrenal metastasis. Electronically Signed   By: Richardean Sale M.D.   On: 10/12/2016 15:03   Nm Pet Image Initial (pi) Skull Base To Thigh  Result Date: 09/23/2016 CLINICAL DATA:  Subsequent treatment strategy for lung carcinoma. EXAM: NUCLEAR MEDICINE PET SKULL BASE TO THIGH TECHNIQUE: 13.4 mCi F-18 FDG was injected intravenously. Full-ring PET imaging was performed from the skull base to thigh after the radiotracer. CT data was obtained and used for attenuation correction and anatomic localization. The PET data acquisition was terminated after completing a skullbase to iliac crest data acquisition. Patient was extremely uncomfortable could not tolerate lying supine. FASTING BLOOD GLUCOSE:  Value: 119 mg/dl COMPARISON:  CT 08/06/2016, PET-CT 1198 FINDINGS: NECK No hypermetabolic lymph nodes in the neck. CHEST RIGHT lower lobe mass is similar in size and metabolic activity measuring 2.7 cm with SUV max equal 7.3 compared to 11.6 on prior. However, there are new nodules in the RIGHT lower lobe compared to most recent CT scan and PET-CT scan which are hypermetabolic. Additionally there is a new hypermetabolic nodule within the LEFT lung upper lobe measuring 10 mm (image 96, series 3) with SUV max equal 4.1. Second lingular nodule is hypermetabolic on  image 546. There is new hypermetabolic mediastinal lymphadenopathy. For example subcarinal lymph node with SUV max equals 6.0. RIGHT paratracheal nodes which are hypermetabolic. RIGHT hilar lymph node is newly enlarged and hypermetabolic. Prevascular lymph nodes are hypermetabolic with SUV max equal 3.9. ABDOMEN/PELVIS There is new enlargement of the RIGHT adrenal gland which is hypermetabolic with SUV max equals 7.1. The more inferior abdomen and pelvis is not imaged by PET imaging.imaging. Defect within the RIGHT iliac wing is favored related to prior bone harvesting. SKELETON New hypermetabolic lesion in the E70 pedicle on the LEFT (image 142, series 3) with SUV max equal 6.5. New rib lesion in T8 on the LEFT adjacent to the transverse process with SUV max equal 4.6. IMPRESSION: 1. Lung cancer progression with new hypermetabolic RIGHT lower lobe and LEFT upper lobe pulmonary nodules. 2. New hypermetabolic mediastinal lymph nodes consistent metastasis. 3. Hypermetabolic RIGHT adrenal gland metastasis. 4. New skeletal metastasis to the T10 vertebral body and LEFT rib. T 5. Inferior abdomen and pelvis not imaged by PET due to patient discomfort. Electronically Signed   By: Suzy Bouchard M.D.   On: 09/23/2016 13:51   Ct Renal Stone Study  Result Date: 10/11/2016 CLINICAL DATA:  Acute left flank pain.  Known lung cancer. EXAM: CT ABDOMEN AND PELVIS WITHOUT CONTRAST TECHNIQUE: Multidetector CT imaging of the abdomen and pelvis was performed following the standard protocol without IV contrast. COMPARISON:  PET-CT 09/23/2016 FINDINGS: Lower chest: Pulmonary masses are seen within the right middle lobe, right lower lobe and left lower lobe, consistent with known metastatic lung cancer. There is a small right pleural effusion. Small pericardial effusion. Hepatobiliary: No focal liver abnormality is seen. No gallstones, gallbladder wall thickening, or biliary dilatation. Pancreas:  Unremarkable. No pancreatic ductal  dilatation or surrounding inflammatory changes. Spleen: Normal in size without focal abnormality. Adrenals/Urinary Tract: Known metastatic right adrenal mass measures 18 mm in cross-section. No evidence of nephrolithiasis, hydronephrosis or obstructive uropathy. The urinary bladder is normal, although partially obscured by beam hardening artifact from left hip prosthesis. Stomach/Bowel: Stomach is within normal limits. Appendix appears normal. No evidence of bowel wall thickening, distention, or inflammatory changes. Vascular/Lymphatic: Calcific atherosclerotic disease of the abdominal aorta. Numerous mildly enlarged retroperitoneal lymph nodes centered about the takeoff of the renal arteries. Reproductive: Prostate is unremarkable. Other: No abdominal wall hernia or abnormality. No abdominopelvic ascites. Musculoskeletal: 10 mm sclerotic focus within the superior aspect of L4 vertebral body. The previously demonstrated by PET-CT metastatic deposit within T10 vertebral body corresponds to a lytic lesion within the left pedicle, axial image 6/91 sequence 2 and sagittal image 101/165, sequence 6. There is a small soft tissue component to this lesion. Large permeative lytic lesion within the left ilium, bordering the SI joint measures 5.3 cm. IMPRESSION: No evidence of obstructive uropathy or nephrolithiasis. Pulmonary masses within the right middle lobe, right lower lobe, left lower lobe, consistent with known metastatic lung cancer. Small to moderate right pleural effusion. Small pericardial effusion. Known metastatic right adrenal mass. Numerous mildly enlarged retroperitoneal upper abdominal lymph nodes, likely metastatic. T10 left pedicle metastatic lytic lesion. Given its proximity to the osseous neural foramen, it may have caused patient's symptoms. Large permeative lytic lesion within the left ilium causing cortical destruction, probably also metastatic. Electronically Signed   By: Fidela Salisbury M.D.    On: 10/11/2016 21:11     CBC  Recent Labs Lab 10/11/16 2032 10/12/16 0346  WBC 6.1 5.3  HGB 10.6* 10.2*  HCT 31.6* 30.5*  PLT 355 316  MCV 84.3 84.4  MCH 28.3 28.1  MCHC 33.5 33.3  RDW 14.4 14.5    Chemistries   Recent Labs Lab 10/11/16 2032 10/12/16 0346  NA 137 138  K 4.1 4.1  CL 100* 105  CO2 29 28  GLUCOSE 194* 121*  BUN 14 10  CREATININE 0.72 0.59*  CALCIUM 8.3* 8.1*  AST 38  --   ALT 35  --   ALKPHOS 74  --   BILITOT 0.2*  --    ------------------------------------------------------------------------------------------------------------------ estimated creatinine clearance is 115 mL/min (A) (by C-G formula based on SCr of 0.59 mg/dL (L)). ------------------------------------------------------------------------------------------------------------------ No results for input(s): HGBA1C in the last 72 hours. ------------------------------------------------------------------------------------------------------------------ No results for input(s): CHOL, HDL, LDLCALC, TRIG, CHOLHDL, LDLDIRECT in the last 72 hours. ------------------------------------------------------------------------------------------------------------------ No results for input(s): TSH, T4TOTAL, T3FREE, THYROIDAB in the last 72 hours.  Invalid input(s): FREET3 ------------------------------------------------------------------------------------------------------------------ No results for input(s): VITAMINB12, FOLATE, FERRITIN, TIBC, IRON, RETICCTPCT in the last 72 hours.  Coagulation profile No results for input(s): INR, PROTIME in the last 168 hours.  No results for input(s): DDIMER in the last 72 hours.  Cardiac Enzymes No results for input(s): CKMB, TROPONINI, MYOGLOBIN in the last 168 hours.  Invalid input(s): CK ------------------------------------------------------------------------------------------------------------------ Invalid input(s): [place]   This  is a 51 y.o. 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 He is refusing Suppository or enema  # Severe back pain due to lytic  lesions of T7 and T10 - Improved Also involved are left eighth rib and left iliac bone from metastasis of lung cancer. MRI of the thoracolumbar spine recommended by neurosurgery. Patient refused an MRI at this time and does not want any surgeries or kyphoplasty. Oxycontin 40mg  BID. Oxycodone 10mg  Q 4 hr PRN.  #Acute on chronic COPD exacerbation with acute on chronic respiratory failure Improved. Feels he is back to baseline. Reduced prednisone. Stop today Nebs PRN  #. Lung cancer with metastatic disease Prognosis poor. Will need hospice following after discharge  #. History of hypertension and atrial fibrillation -Continue Cardizem  #. 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 Tobacco Use disorder Patient counseled to quit smoking earlier this admission  #DO NOT RESUSCITATE status     Code Status Orders        Start     Ordered   10/12/16 0134  Full code  Continuous     10/12/16 0133    Code Status History    Date Active Date Inactive Code Status Order ID Comments User Context   10/12/2016  1:18 AM 10/12/2016  1:33 AM DNR [id number]  Harvie Bridge, DO ED   01/23/2016 10:03 PM 01/26/2016  2:54 PM DNR [id number]  Fritzi Mandes, MD Inpatient   12/09/2015 11:40 AM 12/12/2015  4:07 PM Full Code [id number]  Epifanio Lesches, MD ED   10/24/2015  1:10 PM 10/27/2015  2:10 PM DNR [id number]  Gladstone Lighter, MD ED   10/20/2015 10:51 AM 10/23/2015  4:37 PM DNR [id number]   Laverle Hobby, MD Inpatient   10/19/2015  8:55 AM 10/20/2015 10:51 AM Full Code [id number]  Dustin Flock, MD Inpatient   10/18/2015 12:29 PM 10/19/2015  8:55 AM DNR [id number]  Laverle Hobby, MD Inpatient   10/18/2015  8:53 AM 10/18/2015 12:29 PM Full Code [id number]  Loletha Grayer, MD Inpatient   10/15/2015  4:43 PM 10/18/2015  8:53 AM DNR [id number]  Lytle Butte, MD ED   10/15/2015  4:43 PM 10/15/2015  4:43 PM Full Code [id number]  Lytle Butte, MD ED   06/26/2015 10:13 AM 06/28/2015  6:05 PM DNR [id number]  Bettey Costa, MD Inpatient   06/26/2015  6:05 AM 06/26/2015 10:13 AM Full Code [id number]  Saundra Shelling, MD Inpatient   06/08/2015 12:15 AM 06/10/2015  4:32 PM Full Code [id number]  Saundra Shelling, MD Inpatient    Advance Directive Documentation     Most Recent Value  Type of Advance Directive  Healthcare Power of [place], Living will  Pre-existing out of facility DNR order (yellow form or pink MOST form)  -  "MOST" Form in Place?  -      Consults  neurosurgery  DVT Prophylaxis  Lovenox    Lab Results  Component Value Date   PLT 316 10/12/2016   Time Spent in minutes   25 min   Hillary Bow R M.D on 10/17/2016 at 1:15 PM  Between 7am to 6pm - Pager - [phone]  After 6pm go to [email or web address] - password [email or web address]   Office  [phone]

## 2016-10-18 LAB — GLUCOSE, CAPILLARY
GLUCOSE-CAPILLARY: 219 mg/dL — AB (ref 65–99)
Glucose-Capillary: 112 mg/dL — ABNORMAL HIGH (ref 65–99)
Glucose-Capillary: 161 mg/dL — ABNORMAL HIGH (ref 65–99)
Glucose-Capillary: 146 mg/dL — ABNORMAL HIGH (ref 65–99)

## 2016-10-18 MED ORDER — FLEET ENEMA 7-19 GM/118ML RE ENEM
1.0000 | ENEMA | Freq: Once | RECTAL | Status: AC
  Administered 2016-10-18: 12:00:00 1 via RECTAL

## 2016-10-18 MED ORDER — POLYETHYLENE GLYCOL 3350 17 G PO PACK
17.0000 g | PACK | Freq: Two times a day (BID) | ORAL | Status: DC
  Administered 2016-10-19 – 2016-10-21 (×5): 17 g via ORAL
  Filled 2016-10-18 (×6): qty 1

## 2016-10-18 MED ORDER — LACTULOSE 10 GM/15ML PO SOLN
30.0000 g | Freq: Two times a day (BID) | ORAL | Status: AC
  Administered 2016-10-18 – 2016-10-19 (×3): 30 g via ORAL
  Filled 2016-10-18 (×3): qty 60

## 2016-10-18 NOTE — Progress Notes (Signed)
Pt agreed to fleets enemas which was given with 2 CNA's present with pt tolerating well.

## 2016-10-18 NOTE — Clinical Social Work Note (Addendum)
CSW contacted the patient's mother/guardian to discuss bed offers. The only bed offer at this time is [place], and Ms. Edison Pace declined that offer. The CSW offered to expand the referral search to include [facility], and the Ms. Edison Pace agreed. The CSW explained to Ms. Edison Pace the barriers to placement (waiting for PASSAR and insurance). Ms. Edison Pace reported that she understands.  Ms. Edison Pace called the CSW back and reported that she would accept the offer from [facility] [place]. She voiced that she would like to have him moved closer if possible. CSW will continue to follow.  Santiago Bumpers, MSW, Latanya Presser [phone]

## 2016-10-18 NOTE — Plan of Care (Signed)
Problem: Safety: Goal: Ability to remain free from injury will improve Outcome: Progressing Pt refuses to have bed alarm on at intervals. Repeatedly educated not to get up without assistance. Yellow socks on.

## 2016-10-18 NOTE — Plan of Care (Signed)
Problem: Education: Goal: Knowledge of [facility] Education information/materials will improve Outcome: Progressing Patient remained safe. Still continues to have pain to multiple sites in his body primarily on his hips. Medicated with oxycodone 10mg  po as PRN order several times during the shift. Patient had a small bowel movement 10/17/16. He had refused to take any more laxatives. Needs attended. Will continue to monitor. Vital signs remains stable.

## 2016-10-18 NOTE — Progress Notes (Addendum)
Pt has had only 3 small fecal balls yesterday with all of laxative interventions that pt would allow. Up to [facility] with flatus only; abdomen distended and taunt with hypoactive sounds. Pt ate well at bkf; denies nausea/vomiting. MD made aware with senna, colace, miralax, and lactulose given with no results thus far. Continues to report pain in back and bilateral hips with pt sleeping soundly at long intervals. Pt refused initial physical assessement, but then allowed partial assessment. Refuses SCD's, tramadol, lidocaine patches, k pad, back brace.

## 2016-10-18 NOTE — Progress Notes (Signed)
[place] [facility]                                                                                                                                                                                  Patient Demographics   Craig Wright, is a 51 y.o. male, DOB - [birth year], PJA:[id number]  Admit date - 10/11/2016   Admitting Physician Harvie Bridge, DO  Outpatient Primary MD for the patient is Marygrace Drought, MD   LOS - 4  Subjective:  Pain is well controlled. Still constipated. Passing gas. No abdominal pain. Refusing SCDs and multiple medications   Review of Systems:   CONSTITUTIONAL: No documented fever. No fatigue, weakness. No weight gain, no weight loss.  EYES: No blurry or double vision.  ENT: No tinnitus. No postnasal drip. No redness of the oropharynx.  RESPIRATORY:Positive cough, positive wheeze, no hemoptysis. Positive dyspnea.  CARDIOVASCULAR: No chest pain. No orthopnea. No palpitations. No syncope.  GASTROINTESTINAL: No nausea, no vomiting or diarrhea. No abdominal pain. No melena or hematochezia.  GENITOURINARY: No dysuria or hematuria.  ENDOCRINE: No polyuria or nocturia. No heat or cold intolerance.  HEMATOLOGY: No anemia. No bruising. No bleeding.  INTEGUMENTARY: No rashes. No lesions.  MUSCULOSKELETAL: No arthritis. No swelling. No gout. Pain all over NEUROLOGIC: No numbness, tingling, or ataxia. No seizure-type activity.  PSYCHIATRIC: No anxiety. No insomnia. No ADD.    Vitals:   Vitals:   10/17/16 1408 10/17/16 2014 10/18/16 0528 10/18/16 0824  BP: 117/68 117/72 126/74 121/81  Pulse: (!) 114 (!) 115 (!) 103 (!) 125  Resp: 14 20 20 20   Temp: 98.5 F (36.9 C) 98.1 F (36.7 C) 98 F (36.7 C)   TempSrc: Oral Oral Oral   SpO2: 96% 96% 99% 95%  Weight:      Height:        Wt Readings from Last 3 Encounters:  10/11/16 77.1 kg (170 lb)  10/09/16 78 kg (172 lb)  09/28/16 79.2 kg (174 lb 9 oz)     Intake/Output  Summary (Last 24 hours) at 10/18/16 1139 Last data filed at 10/18/16 0800  Gross per 24 hour  Intake              360 ml  Output             1150 ml  Net             -790 ml    Physical Exam:   GENERAL: Pleasant-appearing in no apparent distress.  HEAD, EYES, EARS, NOSE AND THROAT: Atraumatic, normocephalic. Extraocular muscles are intact. Pupils equal and reactive to light. Sclerae anicteric. No conjunctival injection. No oro-pharyngeal erythema.  NECK: Supple. There is no jugular venous distention. No bruits, no lymphadenopathy, no thyromegaly.  HEART: Regular rate and rhythm,. No murmurs, no rubs, no clicks.  LUNGS: Wheezing and rhonchus breath sounds on the right lung, left lung occasional wheezing  ABDOMEN: Soft, flat, nontender, nondistended. Has good bowel sounds. No hepatosplenomegaly appreciated.  EXTREMITIES: No evidence of any cyanosis, clubbing, or peripheral edema.  +2 pedal and radial pulses bilaterally.  NEUROLOGIC: The patient is alert, awake, and oriented x3 with no focal motor or sensory deficits appreciated bilaterally.  SKIN: Moist and warm with no rashes appreciated.  Psych: Not anxious.  LN: No inguinal LN enlargement  Antibiotics   Anti-infectives    Start     Dose/Rate Route Frequency Ordered Stop   10/12/16 1230  azithromycin (ZITHROMAX) tablet 500 mg     500 mg Oral Daily 10/12/16 1133 10/14/16 1002      Medications   Scheduled Meds: . buPROPion  100 mg Oral BH-q7a  . clonazePAM  1 mg Oral BID  . diltiazem  300 mg Oral Daily  . DULoxetine  60 mg Oral Daily  . feeding supplement (ENSURE ENLIVE)  237 mL Oral TID BM  . furosemide  40 mg Oral Daily  . insulin aspart  0-15 Units Subcutaneous TID WC  . insulin aspart  0-5 Units Subcutaneous QHS  . lactulose  30 g Oral BID  . magnesium oxide  400 mg Oral Daily  . mouth rinse  15 mL Mouth Rinse BID  . megestrol  200 mg Oral Daily  . metFORMIN  500 mg Oral BID WC  . mometasone-formoterol  2 puff  Inhalation BID  . nicotine  21 mg Transdermal Daily  . oxyCODONE  40 mg Oral Q12H  . polyethylene glycol  17 g Oral BID  . potassium chloride SA  20 mEq Oral BID  . roflumilast  500 mcg Oral Daily  . sodium chloride flush  3 mL Intravenous Q12H  . sodium phosphate  1 enema Rectal Once   Continuous Infusions:  PRN Meds:.acetaminophen, albuterol, docusate sodium, gadobenate dimeglumine, guaifenesin, HYDROmorphone (DILAUDID) injection, ipratropium, ipratropium-albuterol, ondansetron, oxyCODONE, senna, sodium chloride flush, traMADol   Data Review:   Micro Results No results found for this or any previous visit (from the past 240 hour(s)).  Radiology Reports Ct Thoracic Spine Wo Contrast  Result Date: 10/12/2016 CLINICAL DATA:  Acute onset of mid back pain while seated today. No acute injury. Lung cancer metastatic to bone. EXAM: CT THORACIC AND LUMBAR SPINE WITHOUT CONTRAST TECHNIQUE: Multidetector CT imaging of the thoracic and lumbar spine was performed without contrast. Multiplanar CT image reconstructions were also generated. COMPARISON:  Abdominopelvic CT 10/11/2016. PET-CT 09/23/2016. Chest CT 08/06/2016. FINDINGS: CT THORACIC SPINE FINDINGS Alignment: Normal. Vertebrae: Known lytic metastasis involving the left T10 pedicle, transverse process and lamina is grossly stable, measuring up to 2.6 x 1.8 cm on image 120. In correlation with prior PET-CT, there are probable metastases involving the T7 spinous process and left eighth rib near the costovertebral junction. No definite new lesions. No evidence of pathologic fracture or epidural tumor. Paraspinal and other soft tissues: No paraspinal masses are seen. Right infrahilar mass, patchy right basilar airspace disease and a right pleural effusion appears similar to recent abdominal CT. Disc levels: Mild degenerative changes throughout the spine with mild endplate degeneration. No large disc herniation, spinal stenosis or nerve root encroachment  seen. CT LUMBAR SPINE FINDINGS Segmentation: Normal. Alignment: Normal. Vertebrae: No lytic or enlarging blastic lesions are seen  within the lumbar spine. There is a stable small sclerotic lesion in the right aspect of the L4 vertebral body, likely benign. There is a large lytic lesion within the left iliac bone abutting the sacroiliac joint, incompletely visualized, although grossly stable from prior studies. No evidence of pathologic fracture. Paraspinal and other soft tissues: No paraspinal masses are seen. Enlarged right adrenal gland again noted, consistent with a metastasis. There is aortic and branch vessel atherosclerosis. Disc levels: No significant disc space findings are seen within the lumbar spine at or above L3-4. L4-5: Loss of disc height with annular disc bulging and a probable left paracentral disc extrusion. There is inferior migration of disc material, mass effect on the thecal sac and probable left L5 nerve root encroachment. There is no foraminal compromise or L4 nerve root encroachment. L5-S1:  Normal interspace. IMPRESSION: 1. Lytic lesions involving the T7 spinous process, left eighth rib, left aspect of the T10 posterior elements and left iliac bone are similar to the recent prior studies and consistent with metastatic lung cancer. No enlarging lesions, pathologic fracture or epidural tumor seen. 2. Disc extrusion on the left at L4-5 may be symptomatic on the basis of left L5 nerve root encroachment in the canal. 3. Grossly stable findings of metastatic lung cancer in the right hemithorax and right adrenal metastasis. Electronically Signed   By: Richardean Sale M.D.   On: 10/12/2016 15:03   Ct Lumbar Spine Wo Contrast  Result Date: 10/12/2016 CLINICAL DATA:  Acute onset of mid back pain while seated today. No acute injury. Lung cancer metastatic to bone. EXAM: CT THORACIC AND LUMBAR SPINE WITHOUT CONTRAST TECHNIQUE: Multidetector CT imaging of the thoracic and lumbar spine was performed  without contrast. Multiplanar CT image reconstructions were also generated. COMPARISON:  Abdominopelvic CT 10/11/2016. PET-CT 09/23/2016. Chest CT 08/06/2016. FINDINGS: CT THORACIC SPINE FINDINGS Alignment: Normal. Vertebrae: Known lytic metastasis involving the left T10 pedicle, transverse process and lamina is grossly stable, measuring up to 2.6 x 1.8 cm on image 120. In correlation with prior PET-CT, there are probable metastases involving the T7 spinous process and left eighth rib near the costovertebral junction. No definite new lesions. No evidence of pathologic fracture or epidural tumor. Paraspinal and other soft tissues: No paraspinal masses are seen. Right infrahilar mass, patchy right basilar airspace disease and a right pleural effusion appears similar to recent abdominal CT. Disc levels: Mild degenerative changes throughout the spine with mild endplate degeneration. No large disc herniation, spinal stenosis or nerve root encroachment seen. CT LUMBAR SPINE FINDINGS Segmentation: Normal. Alignment: Normal. Vertebrae: No lytic or enlarging blastic lesions are seen within the lumbar spine. There is a stable small sclerotic lesion in the right aspect of the L4 vertebral body, likely benign. There is a large lytic lesion within the left iliac bone abutting the sacroiliac joint, incompletely visualized, although grossly stable from prior studies. No evidence of pathologic fracture. Paraspinal and other soft tissues: No paraspinal masses are seen. Enlarged right adrenal gland again noted, consistent with a metastasis. There is aortic and branch vessel atherosclerosis. Disc levels: No significant disc space findings are seen within the lumbar spine at or above L3-4. L4-5: Loss of disc height with annular disc bulging and a probable left paracentral disc extrusion. There is inferior migration of disc material, mass effect on the thecal sac and probable left L5 nerve root encroachment. There is no foraminal  compromise or L4 nerve root encroachment. L5-S1:  Normal interspace. IMPRESSION: 1. Lytic lesions involving the  T7 spinous process, left eighth rib, left aspect of the T10 posterior elements and left iliac bone are similar to the recent prior studies and consistent with metastatic lung cancer. No enlarging lesions, pathologic fracture or epidural tumor seen. 2. Disc extrusion on the left at L4-5 may be symptomatic on the basis of left L5 nerve root encroachment in the canal. 3. Grossly stable findings of metastatic lung cancer in the right hemithorax and right adrenal metastasis. Electronically Signed   By: Richardean Sale M.D.   On: 10/12/2016 15:03   Nm Pet Image Initial (pi) Skull Base To Thigh  Result Date: 09/23/2016 CLINICAL DATA:  Subsequent treatment strategy for lung carcinoma. EXAM: NUCLEAR MEDICINE PET SKULL BASE TO THIGH TECHNIQUE: 13.4 mCi F-18 FDG was injected intravenously. Full-ring PET imaging was performed from the skull base to thigh after the radiotracer. CT data was obtained and used for attenuation correction and anatomic localization. The PET data acquisition was terminated after completing a skullbase to iliac crest data acquisition. Patient was extremely uncomfortable could not tolerate lying supine. FASTING BLOOD GLUCOSE:  Value: 119 mg/dl COMPARISON:  CT 08/06/2016, PET-CT 1198 FINDINGS: NECK No hypermetabolic lymph nodes in the neck. CHEST RIGHT lower lobe mass is similar in size and metabolic activity measuring 2.7 cm with SUV max equal 7.3 compared to 11.6 on prior. However, there are new nodules in the RIGHT lower lobe compared to most recent CT scan and PET-CT scan which are hypermetabolic. Additionally there is a new hypermetabolic nodule within the LEFT lung upper lobe measuring 10 mm (image 96, series 3) with SUV max equal 4.1. Second lingular nodule is hypermetabolic on image 382. There is new hypermetabolic mediastinal lymphadenopathy. For example subcarinal lymph node with  SUV max equals 6.0. RIGHT paratracheal nodes which are hypermetabolic. RIGHT hilar lymph node is newly enlarged and hypermetabolic. Prevascular lymph nodes are hypermetabolic with SUV max equal 3.9. ABDOMEN/PELVIS There is new enlargement of the RIGHT adrenal gland which is hypermetabolic with SUV max equals 7.1. The more inferior abdomen and pelvis is not imaged by PET imaging.imaging. Defect within the RIGHT iliac wing is favored related to prior bone harvesting. SKELETON New hypermetabolic lesion in the N05 pedicle on the LEFT (image 142, series 3) with SUV max equal 6.5. New rib lesion in T8 on the LEFT adjacent to the transverse process with SUV max equal 4.6. IMPRESSION: 1. Lung cancer progression with new hypermetabolic RIGHT lower lobe and LEFT upper lobe pulmonary nodules. 2. New hypermetabolic mediastinal lymph nodes consistent metastasis. 3. Hypermetabolic RIGHT adrenal gland metastasis. 4. New skeletal metastasis to the T10 vertebral body and LEFT rib. T 5. Inferior abdomen and pelvis not imaged by PET due to patient discomfort. Electronically Signed   By: Suzy Bouchard M.D.   On: 09/23/2016 13:51   Ct Renal Stone Study  Result Date: 10/11/2016 CLINICAL DATA:  Acute left flank pain.  Known lung cancer. EXAM: CT ABDOMEN AND PELVIS WITHOUT CONTRAST TECHNIQUE: Multidetector CT imaging of the abdomen and pelvis was performed following the standard protocol without IV contrast. COMPARISON:  PET-CT 09/23/2016 FINDINGS: Lower chest: Pulmonary masses are seen within the right middle lobe, right lower lobe and left lower lobe, consistent with known metastatic lung cancer. There is a small right pleural effusion. Small pericardial effusion. Hepatobiliary: No focal liver abnormality is seen. No gallstones, gallbladder wall thickening, or biliary dilatation. Pancreas: Unremarkable. No pancreatic ductal dilatation or surrounding inflammatory changes. Spleen: Normal in size without focal abnormality.  Adrenals/Urinary Tract: Known metastatic  right adrenal mass measures 18 mm in cross-section. No evidence of nephrolithiasis, hydronephrosis or obstructive uropathy. The urinary bladder is normal, although partially obscured by beam hardening artifact from left hip prosthesis. Stomach/Bowel: Stomach is within normal limits. Appendix appears normal. No evidence of bowel wall thickening, distention, or inflammatory changes. Vascular/Lymphatic: Calcific atherosclerotic disease of the abdominal aorta. Numerous mildly enlarged retroperitoneal lymph nodes centered about the takeoff of the renal arteries. Reproductive: Prostate is unremarkable. Other: No abdominal wall hernia or abnormality. No abdominopelvic ascites. Musculoskeletal: 10 mm sclerotic focus within the superior aspect of L4 vertebral body. The previously demonstrated by PET-CT metastatic deposit within T10 vertebral body corresponds to a lytic lesion within the left pedicle, axial image 6/91 sequence 2 and sagittal image 101/165, sequence 6. There is a small soft tissue component to this lesion. Large permeative lytic lesion within the left ilium, bordering the SI joint measures 5.3 cm. IMPRESSION: No evidence of obstructive uropathy or nephrolithiasis. Pulmonary masses within the right middle lobe, right lower lobe, left lower lobe, consistent with known metastatic lung cancer. Small to moderate right pleural effusion. Small pericardial effusion. Known metastatic right adrenal mass. Numerous mildly enlarged retroperitoneal upper abdominal lymph nodes, likely metastatic. T10 left pedicle metastatic lytic lesion. Given its proximity to the osseous neural foramen, it may have caused patient's symptoms. Large permeative lytic lesion within the left ilium causing cortical destruction, probably also metastatic. Electronically Signed   By: Fidela Salisbury M.D.   On: 10/11/2016 21:11     CBC  Recent Labs Lab 10/11/16 2032 10/12/16 0346  WBC 6.1 5.3   HGB 10.6* 10.2*  HCT 31.6* 30.5*  PLT 355 316  MCV 84.3 84.4  MCH 28.3 28.1  MCHC 33.5 33.3  RDW 14.4 14.5    Chemistries   Recent Labs Lab 10/11/16 2032 10/12/16 0346  NA 137 138  K 4.1 4.1  CL 100* 105  CO2 29 28  GLUCOSE 194* 121*  BUN 14 10  CREATININE 0.72 0.59*  CALCIUM 8.3* 8.1*  AST 38  --   ALT 35  --   ALKPHOS 74  --   BILITOT 0.2*  --    ------------------------------------------------------------------------------------------------------------------ estimated creatinine clearance is 115 mL/min (A) (by C-G formula based on SCr of 0.59 mg/dL (L)). ------------------------------------------------------------------------------------------------------------------ No results for input(s): HGBA1C in the last 72 hours. ------------------------------------------------------------------------------------------------------------------ No results for input(s): CHOL, HDL, LDLCALC, TRIG, CHOLHDL, LDLDIRECT in the last 72 hours. ------------------------------------------------------------------------------------------------------------------ No results for input(s): TSH, T4TOTAL, T3FREE, THYROIDAB in the last 72 hours.  Invalid input(s): FREET3 ------------------------------------------------------------------------------------------------------------------ No results for input(s): VITAMINB12, FOLATE, FERRITIN, TIBC, IRON, RETICCTPCT in the last 72 hours.  Coagulation profile No results for input(s): INR, PROTIME in the last 168 hours.  No results for input(s): DDIMER in the last 72 hours.  Cardiac Enzymes No results for input(s): CKMB, TROPONINI, MYOGLOBIN in the last 168 hours.  Invalid input(s): CK ------------------------------------------------------------------------------------------------------------------ Invalid input(s): [place]   This is a 51 y.o. 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 Patient had refused suppository or enema yesterday. Discussed and agreed. Ordered fleets enema.  # Severe back pain due to lytic lesions of T7 and T10 - Improved Also involved are left eighth rib and left iliac bone from metastasis of lung  cancer. MRI of the thoracolumbar spine recommended by neurosurgery. Patient refused an MRI at this time and does not want any surgeries or kyphoplasty. Oxycontin 40mg  BID. Oxycodone 10mg  Q 4 hr PRN.  #Acute on chronic COPD exacerbation with acute on chronic respiratory failure Resolved Finish prednisone Nebs PRN  #. Lung cancer with metastatic disease Prognosis poor. Will need hospice following after discharge  #. History of hypertension and atrial fibrillation -Continue Cardizem  #. 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 Tobacco Use disorder Patient counseled to quit smoking earlier this admission  #DO NOT RESUSCITATE status       Code Status Orders        Start     Ordered   10/12/16 0134  Full CODE  Continuous     10/12/16 0133    Code Status History    Date Active Date Inactive Code Status Order ID Comments User Context   10/12/2016  1:18 AM 10/12/2016  1:33 AM DNR [id number]  Harvie Bridge, DO ED   01/23/2016 10:03 PM 01/26/2016  2:54 PM DNR [id number]  Fritzi Mandes, MD Inpatient   12/09/2015 11:40 AM 12/12/2015  4:07 PM Full Code [id number]  Epifanio Lesches, MD ED   10/24/2015  1:10 PM 10/27/2015  2:10 PM DNR [id number]  Gladstone Lighter, MD ED   10/20/2015 10:51 AM 10/23/2015  4:37 PM DNR [id number]  Laverle Hobby, MD Inpatient   10/19/2015  8:55 AM 10/20/2015 10:51 AM Full Code [id number]  Dustin Flock, MD  Inpatient   10/18/2015 12:29 PM 10/19/2015  8:55 AM DNR [id number]  Laverle Hobby, MD Inpatient   10/18/2015  8:53 AM 10/18/2015 12:29 PM Full Code [id number]  Loletha Grayer, MD Inpatient   10/15/2015  4:43 PM 10/18/2015  8:53 AM DNR [id number]  Lytle Butte, MD ED   10/15/2015  4:43 PM 10/15/2015  4:43 PM Full Code [id number]  Lytle Butte, MD ED   06/26/2015 10:13 AM 06/28/2015  6:05 PM DNR [id number]  Bettey Costa, MD Inpatient   06/26/2015  6:05 AM 06/26/2015 10:13 AM Full Code [id number]  Saundra Shelling, MD Inpatient   06/08/2015 12:15 AM 06/10/2015  4:32 PM Full Code [id number]  Saundra Shelling, MD Inpatient    Advance Directive Documentation     Most Recent Value  Type of Advance Directive  Healthcare Power of [place], Living will  Pre-existing out of facility DNR order (yellow form or pink MOST form)  -  "MOST" Form in Place?  -      Consults  neurosurgery  DVT Prophylaxis  Lovenox    Lab Results  Component Value Date   PLT 316 10/12/2016   Time Spent in minutes   25 min   Hillary Bow R M.D on 10/18/2016 at 11:39 AM  Between 7am to 6pm - Pager - [phone]  After 6pm go to [email or web address] - password [email or web address] [facility]   Office  [phone]

## 2016-10-19 ENCOUNTER — Other Ambulatory Visit: Payer: Self-pay | Admitting: *Deleted

## 2016-10-19 ENCOUNTER — Encounter: Payer: Self-pay | Admitting: Hematology and Oncology

## 2016-10-19 LAB — GLUCOSE, CAPILLARY
GLUCOSE-CAPILLARY: 124 mg/dL — AB (ref 65–99)
GLUCOSE-CAPILLARY: 181 mg/dL — AB (ref 65–99)
GLUCOSE-CAPILLARY: 126 mg/dL — AB (ref 65–99)
Glucose-Capillary: 257 mg/dL — ABNORMAL HIGH (ref 65–99)

## 2016-10-19 MED ORDER — SENNOSIDES-DOCUSATE SODIUM 8.6-50 MG PO TABS
1.0000 | ORAL_TABLET | Freq: Two times a day (BID) | ORAL | Status: DC
  Administered 2016-10-19 – 2016-10-21 (×4): 1 via ORAL
  Filled 2016-10-19 (×4): qty 1

## 2016-10-19 MED ORDER — ENOXAPARIN SODIUM 40 MG/0.4ML ~~LOC~~ SOLN
40.0000 mg | SUBCUTANEOUS | Status: DC
  Administered 2016-10-21: 40 mg via SUBCUTANEOUS
  Filled 2016-10-19 (×3): qty 0.4

## 2016-10-19 MED ORDER — POTASSIUM CHLORIDE CRYS ER 20 MEQ PO TBCR
20.0000 meq | EXTENDED_RELEASE_TABLET | Freq: Every day | ORAL | Status: DC
  Administered 2016-10-20 – 2016-10-21 (×2): 20 meq via ORAL
  Filled 2016-10-19 (×2): qty 1

## 2016-10-19 NOTE — Progress Notes (Signed)
Physical Therapy Treatment Patient Details Name: Craig Wright MRN: [id number] DOB: 1965-12-30 Today's Date: 10/19/2016    History of Present Illness Craig Wright is a 51 y.o. 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  Patient was in a usual state of health until today when he was sitting on the toilet and felt a pop in his left mid back. He reports worsening of his shortness of breath because of pain with deep inspiration. Patient denies fevers/chills, weakness, dizziness, chest pain, N/V/C/D, abdominal pain, dysuria/frequency, changes in mental status. Otherwise there has been no change in status. Patient has been taking medication as prescribed and there has been no recent change in medication or diet.  No recent antibiotics.  There has been no recent illness, hospitalizations, travel or sick contacts. Pt is now admitted for intractable back pain with lesions at T7, T10, L eighth rib, and L ilium. Pt also with acute on chronic COPD exacerbation. Pt is being followed by Hospice currently.     PT Comments    Craig Wright is making modest progress.  He continues to require mod assist to boost to standing from bed due to generalized weakness.  He ambulated 15 ft and reported BLE weakness and declined ambulating farther.  SpO2 remain at or above 91% on 5L O2 when ambulating.  SNF remains most appropriate d/c plan.    Follow Up Recommendations  SNF     Equipment Recommendations  None recommended by PT;Other (comment);3in1 (PT) (utilize his platform walker at discharge, needs [facility] )    Recommendations for Other Services       Precautions / Restrictions Precautions Precautions: Fall Restrictions Weight Bearing Restrictions: No    Mobility  Bed Mobility Overal bed  mobility: Modified Independent             General bed mobility comments: Pt performs indepently.  No physical assist or cues needed.  Increased effort.  Transfers Overall transfer level: Needs assistance Equipment used: Rolling walker (2 wheeled) Transfers: Sit to/from Stand Sit to Stand: Mod assist         General transfer comment: Mod assist to boost to standing and cues to stand upright once in standing.  Pt with poorly controlled descent to sit.  Ambulation/Gait Ambulation/Gait assistance: Min guard Ambulation Distance (Feet): 15 Feet Assistive device: Rolling walker (2 wheeled) Gait Pattern/deviations: Decreased step length - right;Decreased step length - left;Trunk flexed Gait velocity: Decreased Gait velocity interpretation: <1.8 ft/sec, indicative of risk for recurrent falls General Gait Details: Slightly flexed posture and ambulates with L hand in fist pushing down on RW.  Pt reports BLE fatigue and declines attempting further ambulation.  SpO2 remains at or above 91% on 5L O2.  HR up to 126 while ambulating.     Stairs            Wheelchair Mobility    Modified Rankin (Stroke Patients Only)       Balance Overall balance assessment: Needs assistance Sitting-balance support: No upper extremity supported Sitting balance-Leahy Scale: Good     Standing balance support: No upper extremity supported Standing balance-Leahy Scale: Fair Standing balance comment: Pt able to stand statically without UE support but relies on BUE support for dynamic activities  Cognition Arousal/Alertness: Awake/alert Behavior During Therapy: WFL for tasks assessed/performed Overall Cognitive Status: Within Functional Limits for tasks assessed                                        Exercises      General Comments        Pertinent Vitals/Pain Pain Assessment: Faces Faces Pain Scale: Hurts whole lot Pain Location:  Bilateral hips and back Pain Descriptors / Indicators: Grimacing;Guarding;Moaning Pain Intervention(s): Limited activity within patient's tolerance;Monitored during session;Patient requesting pain meds-RN notified    Home Living                      Prior Function            PT Goals (current goals can now be found in the care plan section) Acute Rehab PT Goals Patient Stated Goal: to get stronger PT Goal Formulation: With patient Time For Goal Achievement: 10/29/16 Potential to Achieve Goals: Fair Progress towards PT goals: Progressing toward goals (very modestly)    Frequency    Min 2X/week      PT Plan Current plan remains appropriate    Co-evaluation              AM-PAC PT "6 Clicks" Daily Activity  Outcome Measure  Difficulty turning over in bed (including adjusting bedclothes, sheets and blankets)?: A Little Difficulty moving from lying on back to sitting on the side of the bed? : A Little Difficulty sitting down on and standing up from a chair with arms (e.g., wheelchair, bedside commode, etc,.)?: Unable Help needed moving to and from a bed to chair (including a wheelchair)?: A Little Help needed walking in hospital room?: A Little Help needed climbing 3-5 steps with a railing? : Total 6 Click Score: 14    End of Session Equipment Utilized During Treatment: Gait belt;Oxygen Activity Tolerance: Patient tolerated treatment well;Patient limited by fatigue Patient left: in bed;with call bell/phone within reach;with bed alarm set Nurse Communication: Mobility status;Patient requests pain meds;Other (comment) (SpO2) PT Visit Diagnosis: Unsteadiness on feet (R26.81);Muscle weakness (generalized) (M62.81);History of falling (Z91.81);Pain Pain - Right/Left:  (Bilateral) Pain - part of body: Hip     Time: 5003-7048 PT Time Calculation (min) (ACUTE ONLY): 13 min  Charges:  $Gait Training: 8-22 mins                    G Codes:       Craig Wright PT, DPT 10/19/2016, 10:11 AM

## 2016-10-19 NOTE — Progress Notes (Signed)
Patient and mother are requesting Geophysicist/field seismologist instead of [facility] at discharge.

## 2016-10-19 NOTE — Progress Notes (Signed)
Pt pain better controlled today with scheduled Oxycontin and PRN Oxycodone for breakthrough pain. No BM today, just passing gas. 1 assist up to [facility]- refused getting up to chair.

## 2016-10-19 NOTE — Progress Notes (Signed)
[place] at [facility]                                                                                                                                                                                Patient Demographics   Craig Wright, is a 51 y.o. male, DOB - 02/27/1965, NUU:[id number]  Admit date - 10/11/2016   Admitting Physician Harvie Bridge, DO  Outpatient Primary MD for the patient is Marygrace Drought, MD   LOS - 5  Subjective:  Constipated. Passing gas. No SOB Worked with PT   Review of Systems:   CONSTITUTIONAL: No documented fever. No fatigue, weakness. No weight gain, no weight loss.  EYES: No blurry or double vision.  ENT: No tinnitus. No postnasal drip. No redness of the oropharynx.  RESPIRATORY:Positive cough, positive wheeze, no hemoptysis. Positive dyspnea.  CARDIOVASCULAR: No chest pain. No orthopnea. No palpitations. No syncope.  GASTROINTESTINAL: No nausea, no vomiting or diarrhea. No abdominal pain. No melena or hematochezia.  GENITOURINARY: No dysuria or hematuria.  ENDOCRINE: No polyuria or nocturia. No heat or cold intolerance.  HEMATOLOGY: No anemia. No bruising. No bleeding.  INTEGUMENTARY: No rashes. No lesions.  MUSCULOSKELETAL: No arthritis. No swelling. No gout. Pain all over NEUROLOGIC: No numbness, tingling, or ataxia. No seizure-type activity.  PSYCHIATRIC: No anxiety. No insomnia. No ADD.    Vitals:   Vitals:   10/18/16 1415 10/18/16 2007 10/19/16 0349 10/19/16 1426  BP: 125/73 128/85 131/89 114/65  Pulse: (!) 121 (!) 123 (!) 118 (!) 116  Resp: 20 (!) 24 (!) 22 (!) 24  Temp: 97.7 F (36.5 C) 97.7 F (36.5 C) 97.9 F (36.6 C) 99.1 F (37.3 C)  TempSrc: Oral Oral Axillary Oral  SpO2: 96% 99% 99% 96%  Weight:      Height:        Wt Readings from Last 3 Encounters:  10/11/16 77.1 kg (170 lb)  10/09/16 78 kg (172 lb)  09/28/16 79.2 kg (174 lb 9 oz)     Intake/Output Summary (Last 24 hours) at  10/19/16 1705 Last data filed at 10/19/16 0900  Gross per 24 hour  Intake              360 ml  Output              625 ml  Net             -265 ml    Physical Exam:   GENERAL: Pleasant-appearing in no apparent distress.  HEAD, EYES, EARS, NOSE AND THROAT: Atraumatic, normocephalic. Extraocular muscles are intact. Pupils equal and reactive to light. Sclerae anicteric. No conjunctival injection. No oro-pharyngeal erythema.  NECK: Supple.  There is no jugular venous distention. No bruits, no lymphadenopathy, no thyromegaly.  HEART: Regular rate and rhythm,. No murmurs, no rubs, no clicks.  LUNGS: Wheezing and rhonchus breath sounds on the right lung, left lung occasional wheezing  ABDOMEN: Soft, flat, nontender, nondistended. Has good bowel sounds. No hepatosplenomegaly appreciated.  EXTREMITIES: No evidence of any cyanosis, clubbing, or peripheral edema.  +2 pedal and radial pulses bilaterally.  NEUROLOGIC: The patient is alert, awake, and oriented x3 with no focal motor or sensory deficits appreciated bilaterally.  SKIN: Moist and warm with no rashes appreciated.  Psych: Not anxious.  LN: No inguinal LN enlargement  Antibiotics   Anti-infectives    Start     Dose/Rate Route Frequency Ordered Stop   10/12/16 1230  azithromycin (ZITHROMAX) tablet 500 mg     500 mg Oral Daily 10/12/16 1133 10/14/16 1002      Medications   Scheduled Meds: . buPROPion  100 mg Oral BH-q7a  . clonazePAM  1 mg Oral BID  . diltiazem  300 mg Oral Daily  . DULoxetine  60 mg Oral Daily  . enoxaparin (LOVENOX) injection  40 mg Subcutaneous Q24H  . feeding supplement (ENSURE ENLIVE)  237 mL Oral TID BM  . furosemide  40 mg Oral Daily  . insulin aspart  0-15 Units Subcutaneous TID WC  . insulin aspart  0-5 Units Subcutaneous QHS  . magnesium oxide  400 mg Oral Daily  . mouth rinse  15 mL Mouth Rinse BID  . megestrol  200 mg Oral Daily  . metFORMIN  500 mg Oral BID WC  . mometasone-formoterol  2 puff  Inhalation BID  . nicotine  21 mg Transdermal Daily  . oxyCODONE  40 mg Oral Q12H  . polyethylene glycol  17 g Oral BID  . potassium chloride SA  20 mEq Oral BID  . roflumilast  500 mcg Oral Daily  . senna-docusate  1 tablet Oral BID  . sodium chloride flush  3 mL Intravenous Q12H   Continuous Infusions:  PRN Meds:.acetaminophen, albuterol, docusate sodium, gadobenate dimeglumine, guaifenesin, HYDROmorphone (DILAUDID) injection, ipratropium, ipratropium-albuterol, ondansetron, oxyCODONE, senna, sodium chloride flush, traMADol   Data Review:   Micro Results No results found for this or any previous visit (from the past 240 hour(s)).  Radiology Reports Ct Thoracic Spine Wo Contrast  Result Date: 10/12/2016 CLINICAL DATA:  Acute onset of mid back pain while seated today. No acute injury. Lung cancer metastatic to bone. EXAM: CT THORACIC AND LUMBAR SPINE WITHOUT CONTRAST TECHNIQUE: Multidetector CT imaging of the thoracic and lumbar spine was performed without contrast. Multiplanar CT image reconstructions were also generated. COMPARISON:  Abdominopelvic CT 10/11/2016. PET-CT 09/23/2016. Chest CT 08/06/2016. FINDINGS: CT THORACIC SPINE FINDINGS Alignment: Normal. Vertebrae: Known lytic metastasis involving the left T10 pedicle, transverse process and lamina is grossly stable, measuring up to 2.6 x 1.8 cm on image 120. In correlation with prior PET-CT, there are probable metastases involving the T7 spinous process and left eighth rib near the costovertebral junction. No definite new lesions. No evidence of pathologic fracture or epidural tumor. Paraspinal and other soft tissues: No paraspinal masses are seen. Right infrahilar mass, patchy right basilar airspace disease and a right pleural effusion appears similar to recent abdominal CT. Disc levels: Mild degenerative changes throughout the spine with mild endplate degeneration. No large disc herniation, spinal stenosis or nerve root encroachment  seen. CT LUMBAR SPINE FINDINGS Segmentation: Normal. Alignment: Normal. Vertebrae: No lytic or enlarging blastic lesions are seen within  the lumbar spine. There is a stable small sclerotic lesion in the right aspect of the L4 vertebral body, likely benign. There is a large lytic lesion within the left iliac bone abutting the sacroiliac joint, incompletely visualized, although grossly stable from prior studies. No evidence of pathologic fracture. Paraspinal and other soft tissues: No paraspinal masses are seen. Enlarged right adrenal gland again noted, consistent with a metastasis. There is aortic and branch vessel atherosclerosis. Disc levels: No significant disc space findings are seen within the lumbar spine at or above L3-4. L4-5: Loss of disc height with annular disc bulging and a probable left paracentral disc extrusion. There is inferior migration of disc material, mass effect on the thecal sac and probable left L5 nerve root encroachment. There is no foraminal compromise or L4 nerve root encroachment. L5-S1:  Normal interspace. IMPRESSION: 1. Lytic lesions involving the T7 spinous process, left eighth rib, left aspect of the T10 posterior elements and left iliac bone are similar to the recent prior studies and consistent with metastatic lung cancer. No enlarging lesions, pathologic fracture or epidural tumor seen. 2. Disc extrusion on the left at L4-5 may be symptomatic on the basis of left L5 nerve root encroachment in the canal. 3. Grossly stable findings of metastatic lung cancer in the right hemithorax and right adrenal metastasis. Electronically Signed   By: Richardean Sale M.D.   On: 10/12/2016 15:03   Ct Lumbar Spine Wo Contrast  Result Date: 10/12/2016 CLINICAL DATA:  Acute onset of mid back pain while seated today. No acute injury. Lung cancer metastatic to bone. EXAM: CT THORACIC AND LUMBAR SPINE WITHOUT CONTRAST TECHNIQUE: Multidetector CT imaging of the thoracic and lumbar spine was performed  without contrast. Multiplanar CT image reconstructions were also generated. COMPARISON:  Abdominopelvic CT 10/11/2016. PET-CT 09/23/2016. Chest CT 08/06/2016. FINDINGS: CT THORACIC SPINE FINDINGS Alignment: Normal. Vertebrae: Known lytic metastasis involving the left T10 pedicle, transverse process and lamina is grossly stable, measuring up to 2.6 x 1.8 cm on image 120. In correlation with prior PET-CT, there are probable metastases involving the T7 spinous process and left eighth rib near the costovertebral junction. No definite new lesions. No evidence of pathologic fracture or epidural tumor. Paraspinal and other soft tissues: No paraspinal masses are seen. Right infrahilar mass, patchy right basilar airspace disease and a right pleural effusion appears similar to recent abdominal CT. Disc levels: Mild degenerative changes throughout the spine with mild endplate degeneration. No large disc herniation, spinal stenosis or nerve root encroachment seen. CT LUMBAR SPINE FINDINGS Segmentation: Normal. Alignment: Normal. Vertebrae: No lytic or enlarging blastic lesions are seen within the lumbar spine. There is a stable small sclerotic lesion in the right aspect of the L4 vertebral body, likely benign. There is a large lytic lesion within the left iliac bone abutting the sacroiliac joint, incompletely visualized, although grossly stable from prior studies. No evidence of pathologic fracture. Paraspinal and other soft tissues: No paraspinal masses are seen. Enlarged right adrenal gland again noted, consistent with a metastasis. There is aortic and branch vessel atherosclerosis. Disc levels: No significant disc space findings are seen within the lumbar spine at or above L3-4. L4-5: Loss of disc height with annular disc bulging and a probable left paracentral disc extrusion. There is inferior migration of disc material, mass effect on the thecal sac and probable left L5 nerve root encroachment. There is no foraminal  compromise or L4 nerve root encroachment. L5-S1:  Normal interspace. IMPRESSION: 1. Lytic lesions involving the T7  spinous process, left eighth rib, left aspect of the T10 posterior elements and left iliac bone are similar to the recent prior studies and consistent with metastatic lung cancer. No enlarging lesions, pathologic fracture or epidural tumor seen. 2. Disc extrusion on the left at L4-5 may be symptomatic on the basis of left L5 nerve root encroachment in the canal. 3. Grossly stable findings of metastatic lung cancer in the right hemithorax and right adrenal metastasis. Electronically Signed   By: Richardean Sale M.D.   On: 10/12/2016 15:03   Nm Pet Image Initial (pi) Skull Base To Thigh  Result Date: 09/23/2016 CLINICAL DATA:  Subsequent treatment strategy for lung carcinoma. EXAM: NUCLEAR MEDICINE PET SKULL BASE TO THIGH TECHNIQUE: 13.4 mCi F-18 FDG was injected intravenously. Full-ring PET imaging was performed from the skull base to thigh after the radiotracer. CT data was obtained and used for attenuation correction and anatomic localization. The PET data acquisition was terminated after completing a skullbase to iliac crest data acquisition. Patient was extremely uncomfortable could not tolerate lying supine. FASTING BLOOD GLUCOSE:  Value: 119 mg/dl COMPARISON:  CT 08/06/2016, PET-CT 1198 FINDINGS: NECK No hypermetabolic lymph nodes in the neck. CHEST RIGHT lower lobe mass is similar in size and metabolic activity measuring 2.7 cm with SUV max equal 7.3 compared to 11.6 on prior. However, there are new nodules in the RIGHT lower lobe compared to most recent CT scan and PET-CT scan which are hypermetabolic. Additionally there is a new hypermetabolic nodule within the LEFT lung upper lobe measuring 10 mm (image 96, series 3) with SUV max equal 4.1. Second lingular nodule is hypermetabolic on image 025. There is new hypermetabolic mediastinal lymphadenopathy. For example subcarinal lymph node with  SUV max equals 6.0. RIGHT paratracheal nodes which are hypermetabolic. RIGHT hilar lymph node is newly enlarged and hypermetabolic. Prevascular lymph nodes are hypermetabolic with SUV max equal 3.9. ABDOMEN/PELVIS There is new enlargement of the RIGHT adrenal gland which is hypermetabolic with SUV max equals 7.1. The more inferior abdomen and pelvis is not imaged by PET imaging.imaging. Defect within the RIGHT iliac wing is favored related to prior bone harvesting. SKELETON New hypermetabolic lesion in the E52 pedicle on the LEFT (image 142, series 3) with SUV max equal 6.5. New rib lesion in T8 on the LEFT adjacent to the transverse process with SUV max equal 4.6. IMPRESSION: 1. Lung cancer progression with new hypermetabolic RIGHT lower lobe and LEFT upper lobe pulmonary nodules. 2. New hypermetabolic mediastinal lymph nodes consistent metastasis. 3. Hypermetabolic RIGHT adrenal gland metastasis. 4. New skeletal metastasis to the T10 vertebral body and LEFT rib. T 5. Inferior abdomen and pelvis not imaged by PET due to patient discomfort. Electronically Signed   By: Suzy Bouchard M.D.   On: 09/23/2016 13:51   Ct Renal Stone Study  Result Date: 10/11/2016 CLINICAL DATA:  Acute left flank pain.  Known lung cancer. EXAM: CT ABDOMEN AND PELVIS WITHOUT CONTRAST TECHNIQUE: Multidetector CT imaging of the abdomen and pelvis was performed following the standard protocol without IV contrast. COMPARISON:  PET-CT 09/23/2016 FINDINGS: Lower chest: Pulmonary masses are seen within the right middle lobe, right lower lobe and left lower lobe, consistent with known metastatic lung cancer. There is a small right pleural effusion. Small pericardial effusion. Hepatobiliary: No focal liver abnormality is seen. No gallstones, gallbladder wall thickening, or biliary dilatation. Pancreas: Unremarkable. No pancreatic ductal dilatation or surrounding inflammatory changes. Spleen: Normal in size without focal abnormality.  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 No evidence of nephrolithiasis, hydronephrosis or obstructive uropathy. The urinary bladder is normal, although partially obscured by beam hardening artifact from left hip prosthesis. Stomach/Bowel: Stomach is within normal limits. Appendix appears normal. No evidence of bowel wall thickening, distention, or inflammatory changes. Vascular/Lymphatic: Calcific atherosclerotic disease of the abdominal aorta. Numerous mildly enlarged retroperitoneal lymph nodes centered about the takeoff of the renal arteries. Reproductive: Prostate is unremarkable. Other: No abdominal wall hernia or abnormality. No abdominopelvic ascites. Musculoskeletal: 10 mm sclerotic focus within the superior aspect of L4 vertebral body. The previously demonstrated by PET-CT metastatic deposit within T10 vertebral body corresponds to a lytic lesion within the left pedicle, axial image 6/91 sequence 2 and sagittal image 101/165, sequence 6. There is a small soft tissue component to this lesion. Large permeative lytic lesion within the left ilium, bordering the SI joint measures 5.3 cm. IMPRESSION: No evidence of obstructive uropathy or nephrolithiasis. Pulmonary masses within the right middle lobe, right lower lobe, left lower lobe, consistent with known metastatic lung cancer. Small to moderate right pleural effusion. Small pericardial effusion. Known metastatic right adrenal mass. Numerous mildly enlarged retroperitoneal upper abdominal lymph nodes, likely metastatic. T10 left pedicle metastatic lytic lesion. Given its proximity to the osseous neural foramen, it may have caused patient's symptoms. Large permeative lytic lesion within the left ilium causing cortical destruction, probably also metastatic. Electronically Signed   By: Fidela Salisbury M.D.   On: 10/11/2016 21:11     CBC No results for input(s): WBC, HGB, HCT, PLT, MCV, MCH, MCHC, RDW,  LYMPHSABS, MONOABS, EOSABS, BASOSABS, BANDABS in the last 168 hours.  Invalid input(s): NEUTRABS, BANDSABD  Chemistries  No results for input(s): NA, K, CL, CO2, GLUCOSE, BUN, CREATININE, CALCIUM, MG, AST, ALT, ALKPHOS, BILITOT in the last 168 hours.  Invalid input(s): GFRCGP ------------------------------------------------------------------------------------------------------------------ estimated creatinine clearance is 115 mL/min (A) (by C-G formula based on SCr of 0.59 mg/dL (L)). ------------------------------------------------------------------------------------------------------------------ No results for input(s): HGBA1C in the last 72 hours. ------------------------------------------------------------------------------------------------------------------ No results for input(s): CHOL, HDL, LDLCALC, TRIG, CHOLHDL, LDLDIRECT in the last 72 hours. ------------------------------------------------------------------------------------------------------------------ No results for input(s): TSH, T4TOTAL, T3FREE, THYROIDAB in the last 72 hours.  Invalid input(s): FREET3 ------------------------------------------------------------------------------------------------------------------ No results for input(s): VITAMINB12, FOLATE, FERRITIN, TIBC, IRON, RETICCTPCT in the last 72 hours.  Coagulation profile No results for input(s): INR, PROTIME in the last 168 hours.  No results for input(s): DDIMER in the last 72 hours.  Cardiac Enzymes No results for input(s): CKMB, TROPONINI, MYOGLOBIN in the last 168 hours.  Invalid input(s): CK ------------------------------------------------------------------------------------------------------------------ Invalid input(s): [place]   This is a 51 y.o. male with a history of atrial fibrillation, asthma, COPD, congestive heart failure, coronary artery disease, diabetes,chronic hep C hypertension, lung cancer with metastasis to  the bone now being admitted with:  # constipation, severe - No BM in 7 days No response to MiraLAX, lactulose. Patient had refused suppository or enema yesterday. Discussed and agreed. Ordered fleets enema and small BM Continue miralax BID scheduled  # Severe back pain due to lytic lesions of T7 and T10 - Improved Also involved are left eighth rib and left iliac bone from metastasis of lung cancer. MRI of the thoracolumbar spine recommended by neurosurgery. Patient refused an MRI at this time and does not want any surgeries or kyphoplasty. Oxycontin 40mg  BID. Oxycodone 10mg  Q 4 hr PRN.  #Acute on chronic COPD exacerbation with acute on chronic respiratory failure Resolved Finish prednisone Nebs  PRN  #. Lung cancer with metastatic disease Prognosis poor. Will need hospice following after discharge  #. History of hypertension and atrial fibrillation -Continue Cardizem  #. 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 Tobacco Use disorder Patient counseled to quit smoking earlier this admission  #DO NOT RESUSCITATE status       Code Status Orders        Start     Ordered   10/12/16 0134  Full CODE  Continuous     10/12/16 0133    Code Status History    Date Active Date Inactive Code Status Order ID Comments User Context   10/12/2016  1:18 AM 10/12/2016  1:33 AM DNR [id number]  Harvie Bridge, DO ED   01/23/2016 10:03 PM 01/26/2016  2:54 PM DNR [id number]  Fritzi Mandes, MD Inpatient   12/09/2015 11:40 AM 12/12/2015  4:07 PM Full Code [id number]  Epifanio Lesches, MD ED   10/24/2015  1:10 PM 10/27/2015  2:10 PM DNR [id number]  Gladstone Lighter, MD ED   10/20/2015 10:51 AM 10/23/2015  4:37 PM DNR [id number]  Laverle Hobby, MD Inpatient   10/19/2015  8:55 AM 10/20/2015 10:51 AM Full Code [id number]  Dustin Flock, MD Inpatient   10/18/2015 12:29 PM 10/19/2015  8:55 AM  DNR [id number]  Laverle Hobby, MD Inpatient   10/18/2015  8:53 AM 10/18/2015 12:29 PM Full Code [id number]  Loletha Grayer, MD Inpatient   10/15/2015  4:43 PM 10/18/2015  8:53 AM DNR [id number]  Lytle Butte, MD ED   10/15/2015  4:43 PM 10/15/2015  4:43 PM Full Code [id number]  Lytle Butte, MD ED   06/26/2015 10:13 AM 06/28/2015  6:05 PM DNR [id number]  Bettey Costa, MD Inpatient   06/26/2015  6:05 AM 06/26/2015 10:13 AM Full Code [id number]  Saundra Shelling, MD Inpatient   06/08/2015 12:15 AM 06/10/2015  4:32 PM Full Code [id number]  Saundra Shelling, MD Inpatient    Advance Directive Documentation     Most Recent Value  Type of Advance Directive  Healthcare Power of [place], Living will  Pre-existing out of facility DNR order (yellow form or pink MOST form)  -  "MOST" Form in Place?  -      Consults  neurosurgery  DVT Prophylaxis  Lovenox    Lab Results  Component Value Date   PLT 316 10/12/2016   Time Spent in minutes   25 min   Hillary Bow R M.D on 10/19/2016 at 5:05 PM  Between 7am to 6pm - Pager - [phone]  After 6pm go to [email or web address] - password [email or web address] [place]   Office  [phone]

## 2016-10-20 ENCOUNTER — Ambulatory Visit: Admission: RE | Admit: 2016-10-20 | Payer: Medicaid Other | Source: Ambulatory Visit

## 2016-10-20 ENCOUNTER — Other Ambulatory Visit: Payer: Medicaid Other

## 2016-10-20 ENCOUNTER — Encounter: Payer: Self-pay | Admitting: Hematology and Oncology

## 2016-10-20 LAB — CREATININE, SERUM
Creatinine, Ser: 0.74 mg/dL (ref 0.61–1.24)
GFR calc Af Amer: >60 mL/min (ref >60)
GFR calc non Af Amer: >60 mL/min (ref >60)

## 2016-10-20 LAB — GLUCOSE, CAPILLARY
GLUCOSE-CAPILLARY: 132 mg/dL — AB (ref 65–99)
GLUCOSE-CAPILLARY: 176 mg/dL — AB (ref 65–99)
GLUCOSE-CAPILLARY: 203 mg/dL — AB (ref 65–99)
GLUCOSE-CAPILLARY: 119 mg/dL — AB (ref 65–99)

## 2016-10-20 LAB — CBC
HCT: 32.3 % — ABNORMAL LOW (ref 40.0–52.0)
Hemoglobin: 10.7 g/dL — ABNORMAL LOW (ref 13.0–18.0)
MCH: 27.9 pg (ref 26.0–34.0)
MCHC: 33.3 g/dL (ref 32.0–36.0)
MCV: 83.8 fL (ref 80.0–100.0)
PLATELETS: 317 10*3/uL (ref 150–440)
RBC: 3.85 MIL/uL — AB (ref 4.40–5.90)
RDW: 14.9 % — ABNORMAL HIGH (ref 11.5–14.5)
WBC: 6.7 10*3/uL (ref 3.8–10.6)

## 2016-10-20 MED ORDER — LACTULOSE 10 GM/15ML PO SOLN
30.0000 g | Freq: Three times a day (TID) | ORAL | Status: AC
  Administered 2016-10-20 – 2016-10-21 (×3): 30 g via ORAL
  Filled 2016-10-20 (×3): qty 60

## 2016-10-20 NOTE — Plan of Care (Signed)
Problem: Bowel/Gastric: Goal: Will not experience complications related to bowel motility Outcome: Progressing No BM durring the shift. Continue on senokot and miralax. Lactulose initiated. No c/o n/v/abd pain.Marland Kitchen

## 2016-10-20 NOTE — Progress Notes (Signed)
PT Cancellation Note  Patient Details Name: Craig Wright MRN: [id number] DOB: 10/22/1965   Cancelled Treatment:    Reason Eval/Treat Not Completed: Patient declined, no reason specified. Treatment attempted; pt refuses noting he is in too much pain in his left hip and back. Pt offered gentle exercise/change of position, but pt notes he has been up several times and is moving his legs some in the bed, which helps temporarily. Pt requests therapy check back tomorrow.    Larae Grooms, PTA 10/20/2016, 3:20 PM

## 2016-10-20 NOTE — Clinical Social Work Note (Addendum)
CSW was informed that [facility] can accept patient.  CSW contacted patient's mother who helps with decision making and they have accepted offer from [facility].  CSW continuing to wait for Passar number.  Passar number has been received, patient's 30 day passar number is [phone] E expiration date of 11/19/16.  CSW updated patient's mother and [facility].  Jones Broom. [place], MSW, [place]  10/20/2016 10:18 AM

## 2016-10-20 NOTE — Progress Notes (Signed)
[name] [facility]                                                                                                                                                                                Patient Demographics   Craig Wright, is a 51 y.o. male, DOB - [birth year], ZWC:[id number]  Admit date - 10/11/2016   Admitting Physician Harvie Bridge, DO  Outpatient Primary MD for the patient is Marygrace Drought, MD   LOS - 6  Subjective:  Constipated. Passing gas.  Waiting to go to SNF Pain well controlled   Review of Systems:   CONSTITUTIONAL: No documented fever. No fatigue, weakness. No weight gain, no weight loss.  EYES: No blurry or double vision.  ENT: No tinnitus. No postnasal drip. No redness of the oropharynx.  RESPIRATORY:Positive cough, positive wheeze, no hemoptysis. Positive dyspnea.  CARDIOVASCULAR: No chest pain. No orthopnea. No palpitations. No syncope.  GASTROINTESTINAL: No nausea, no vomiting or diarrhea. No abdominal pain. No melena or hematochezia.  GENITOURINARY: No dysuria or hematuria.  ENDOCRINE: No polyuria or nocturia. No heat or cold intolerance.  HEMATOLOGY: No anemia. No bruising. No bleeding.  INTEGUMENTARY: No rashes. No lesions.  MUSCULOSKELETAL: No arthritis. No swelling. No gout. Pain all over NEUROLOGIC: No numbness, tingling, or ataxia. No seizure-type activity.  PSYCHIATRIC: No anxiety. No insomnia. No ADD.    Vitals:   Vitals:   10/19/16 1426 10/19/16 2039 10/20/16 0635 10/20/16 0907  BP: 114/65 113/71 114/77 104/74  Pulse: (!) 116 (!) 110 (!) 109 (!) 112  Resp: (!) 24 (!) 22 17 18   Temp: 99.1 F (37.3 C) 98.8 F (37.1 C) 98.3 F (36.8 C)   TempSrc: Oral Oral Oral   SpO2: 96% 96% 98% 99%  Weight:      Height:        Wt Readings from Last 3 Encounters:  10/11/16 77.1 kg (170 lb)  10/09/16 78 kg (172 lb)  09/28/16 79.2 kg (174 lb 9 oz)     Intake/Output Summary (Last 24 hours) at 10/20/16  1239 Last data filed at 10/20/16 1026  Gross per 24 hour  Intake              240 ml  Output              900 ml  Net             -660 ml    Physical Exam:   GENERAL: Pleasant-appearing in no apparent distress.  HEAD, EYES, EARS, NOSE AND THROAT: Atraumatic, normocephalic. Extraocular muscles are intact. Pupils equal and reactive to light. Sclerae anicteric. No conjunctival injection. No oro-pharyngeal erythema.  NECK: Supple.  There is no jugular venous distention. No bruits, no lymphadenopathy, no thyromegaly.  HEART: Regular rate and rhythm,. No murmurs, no rubs, no clicks.  LUNGS: Wheezing and rhonchus breath sounds on the right lung, left lung occasional wheezing  ABDOMEN: Soft, flat, nontender, nondistended. Has good bowel sounds. No hepatosplenomegaly appreciated.  EXTREMITIES: No evidence of any cyanosis, clubbing, or peripheral edema.  +2 pedal and radial pulses bilaterally.  NEUROLOGIC: The patient is alert, awake, and oriented x3 with no focal motor or sensory deficits appreciated bilaterally.  SKIN: Moist and warm with no rashes appreciated.  Psych: Not anxious.  LN: No inguinal LN enlargement  Antibiotics   Anti-infectives    Start     Dose/Rate Route Frequency Ordered Stop   10/12/16 1230  azithromycin (ZITHROMAX) tablet 500 mg     500 mg Oral Daily 10/12/16 1133 10/14/16 1002      Medications   Scheduled Meds: . buPROPion  100 mg Oral BH-q7a  . clonazePAM  1 mg Oral BID  . diltiazem  300 mg Oral Daily  . DULoxetine  60 mg Oral Daily  . enoxaparin (LOVENOX) injection  40 mg Subcutaneous Q24H  . feeding supplement (ENSURE ENLIVE)  237 mL Oral TID BM  . furosemide  40 mg Oral Daily  . insulin aspart  0-15 Units Subcutaneous TID WC  . insulin aspart  0-5 Units Subcutaneous QHS  . lactulose  30 g Oral TID  . magnesium oxide  400 mg Oral Daily  . mouth rinse  15 mL Mouth Rinse BID  . megestrol  200 mg Oral Daily  . metFORMIN  500 mg Oral BID WC  .  mometasone-formoterol  2 puff Inhalation BID  . nicotine  21 mg Transdermal Daily  . oxyCODONE  40 mg Oral Q12H  . polyethylene glycol  17 g Oral BID  . potassium chloride SA  20 mEq Oral Daily  . roflumilast  500 mcg Oral Daily  . senna-docusate  1 tablet Oral BID  . sodium chloride flush  3 mL Intravenous Q12H   Continuous Infusions:  PRN Meds:.acetaminophen, albuterol, docusate sodium, gadobenate dimeglumine, guaifenesin, HYDROmorphone (DILAUDID) injection, ipratropium, ipratropium-albuterol, ondansetron, oxyCODONE, senna, sodium chloride flush, traMADol   Data Review:   Micro Results No results found for this or any previous visit (from the past 240 hour(s)).  Radiology Reports Ct Thoracic Spine Wo Contrast  Result Date: 10/12/2016 CLINICAL DATA:  Acute onset of mid back pain while seated today. No acute injury. Lung cancer metastatic to bone. EXAM: CT THORACIC AND LUMBAR SPINE WITHOUT CONTRAST TECHNIQUE: Multidetector CT imaging of the thoracic and lumbar spine was performed without contrast. Multiplanar CT image reconstructions were also generated. COMPARISON:  Abdominopelvic CT 10/11/2016. PET-CT 09/23/2016. Chest CT 08/06/2016. FINDINGS: CT THORACIC SPINE FINDINGS Alignment: Normal. Vertebrae: Known lytic metastasis involving the left T10 pedicle, transverse process and lamina is grossly stable, measuring up to 2.6 x 1.8 cm on image 120. In correlation with prior PET-CT, there are probable metastases involving the T7 spinous process and left eighth rib near the costovertebral junction. No definite new lesions. No evidence of pathologic fracture or epidural tumor. Paraspinal and other soft tissues: No paraspinal masses are seen. Right infrahilar mass, patchy right basilar airspace disease and a right pleural effusion appears similar to recent abdominal CT. Disc levels: Mild degenerative changes throughout the spine with mild endplate degeneration. No large disc herniation, spinal  stenosis or nerve root encroachment seen. CT LUMBAR SPINE FINDINGS Segmentation: Normal. Alignment: Normal. Vertebrae: No  lytic or enlarging blastic lesions are seen within the lumbar spine. There is a stable small sclerotic lesion in the right aspect of the L4 vertebral body, likely benign. There is a large lytic lesion within the left iliac bone abutting the sacroiliac joint, incompletely visualized, although grossly stable from prior studies. No evidence of pathologic fracture. Paraspinal and other soft tissues: No paraspinal masses are seen. Enlarged right adrenal gland again noted, consistent with a metastasis. There is aortic and branch vessel atherosclerosis. Disc levels: No significant disc space findings are seen within the lumbar spine at or above L3-4. L4-5: Loss of disc height with annular disc bulging and a probable left paracentral disc extrusion. There is inferior migration of disc material, mass effect on the thecal sac and probable left L5 nerve root encroachment. There is no foraminal compromise or L4 nerve root encroachment. L5-S1:  Normal interspace. IMPRESSION: 1. Lytic lesions involving the T7 spinous process, left eighth rib, left aspect of the T10 posterior elements and left iliac bone are similar to the recent prior studies and consistent with metastatic lung cancer. No enlarging lesions, pathologic fracture or epidural tumor seen. 2. Disc extrusion on the left at L4-5 may be symptomatic on the basis of left L5 nerve root encroachment in the canal. 3. Grossly stable findings of metastatic lung cancer in the right hemithorax and right adrenal metastasis. Electronically Signed   By: Richardean Sale M.D.   On: 10/12/2016 15:03   Ct Lumbar Spine Wo Contrast  Result Date: 10/12/2016 CLINICAL DATA:  Acute onset of mid back pain while seated today. No acute injury. Lung cancer metastatic to bone. EXAM: CT THORACIC AND LUMBAR SPINE WITHOUT CONTRAST TECHNIQUE: Multidetector CT imaging of the  thoracic and lumbar spine was performed without contrast. Multiplanar CT image reconstructions were also generated. COMPARISON:  Abdominopelvic CT 10/11/2016. PET-CT 09/23/2016. Chest CT 08/06/2016. FINDINGS: CT THORACIC SPINE FINDINGS Alignment: Normal. Vertebrae: Known lytic metastasis involving the left T10 pedicle, transverse process and lamina is grossly stable, measuring up to 2.6 x 1.8 cm on image 120. In correlation with prior PET-CT, there are probable metastases involving the T7 spinous process and left eighth rib near the costovertebral junction. No definite new lesions. No evidence of pathologic fracture or epidural tumor. Paraspinal and other soft tissues: No paraspinal masses are seen. Right infrahilar mass, patchy right basilar airspace disease and a right pleural effusion appears similar to recent abdominal CT. Disc levels: Mild degenerative changes throughout the spine with mild endplate degeneration. No large disc herniation, spinal stenosis or nerve root encroachment seen. CT LUMBAR SPINE FINDINGS Segmentation: Normal. Alignment: Normal. Vertebrae: No lytic or enlarging blastic lesions are seen within the lumbar spine. There is a stable small sclerotic lesion in the right aspect of the L4 vertebral body, likely benign. There is a large lytic lesion within the left iliac bone abutting the sacroiliac joint, incompletely visualized, although grossly stable from prior studies. No evidence of pathologic fracture. Paraspinal and other soft tissues: No paraspinal masses are seen. Enlarged right adrenal gland again noted, consistent with a metastasis. There is aortic and branch vessel atherosclerosis. Disc levels: No significant disc space findings are seen within the lumbar spine at or above L3-4. L4-5: Loss of disc height with annular disc bulging and a probable left paracentral disc extrusion. There is inferior migration of disc material, mass effect on the thecal sac and probable left L5 nerve root  encroachment. There is no foraminal compromise or L4 nerve root encroachment. L5-S1:  Normal  interspace. IMPRESSION: 1. Lytic lesions involving the T7 spinous process, left eighth rib, left aspect of the T10 posterior elements and left iliac bone are similar to the recent prior studies and consistent with metastatic lung cancer. No enlarging lesions, pathologic fracture or epidural tumor seen. 2. Disc extrusion on the left at L4-5 may be symptomatic on the basis of left L5 nerve root encroachment in the canal. 3. Grossly stable findings of metastatic lung cancer in the right hemithorax and right adrenal metastasis. Electronically Signed   By: Richardean Sale M.D.   On: 10/12/2016 15:03   Nm Pet Image Initial (pi) Skull Base To Thigh  Result Date: 09/23/2016 CLINICAL DATA:  Subsequent treatment strategy for lung carcinoma. EXAM: NUCLEAR MEDICINE PET SKULL BASE TO THIGH TECHNIQUE: 13.4 mCi F-18 FDG was injected intravenously. Full-ring PET imaging was performed from the skull base to thigh after the radiotracer. CT data was obtained and used for attenuation correction and anatomic localization. The PET data acquisition was terminated after completing a skullbase to iliac crest data acquisition. Patient was extremely uncomfortable could not tolerate lying supine. FASTING BLOOD GLUCOSE:  Value: 119 mg/dl COMPARISON:  CT 08/06/2016, PET-CT 1198 FINDINGS: NECK No hypermetabolic lymph nodes in the neck. CHEST RIGHT lower lobe mass is similar in size and metabolic activity measuring 2.7 cm with SUV max equal 7.3 compared to 11.6 on prior. However, there are new nodules in the RIGHT lower lobe compared to most recent CT scan and PET-CT scan which are hypermetabolic. Additionally there is a new hypermetabolic nodule within the LEFT lung upper lobe measuring 10 mm (image 96, series 3) with SUV max equal 4.1. Second lingular nodule is hypermetabolic on image 945. There is new hypermetabolic mediastinal lymphadenopathy. For  example subcarinal lymph node with SUV max equals 6.0. RIGHT paratracheal nodes which are hypermetabolic. RIGHT hilar lymph node is newly enlarged and hypermetabolic. Prevascular lymph nodes are hypermetabolic with SUV max equal 3.9. ABDOMEN/PELVIS There is new enlargement of the RIGHT adrenal gland which is hypermetabolic with SUV max equals 7.1. The more inferior abdomen and pelvis is not imaged by PET imaging.imaging. Defect within the RIGHT iliac wing is favored related to prior bone harvesting. SKELETON New hypermetabolic lesion in the W38 pedicle on the LEFT (image 142, series 3) with SUV max equal 6.5. New rib lesion in T8 on the LEFT adjacent to the transverse process with SUV max equal 4.6. IMPRESSION: 1. Lung cancer progression with new hypermetabolic RIGHT lower lobe and LEFT upper lobe pulmonary nodules. 2. New hypermetabolic mediastinal lymph nodes consistent metastasis. 3. Hypermetabolic RIGHT adrenal gland metastasis. 4. New skeletal metastasis to the T10 vertebral body and LEFT rib. T 5. Inferior abdomen and pelvis not imaged by PET due to patient discomfort. Electronically Signed   By: Suzy Bouchard M.D.   On: 09/23/2016 13:51   Ct Renal Stone Study  Result Date: 10/11/2016 CLINICAL DATA:  Acute left flank pain.  Known lung cancer. EXAM: CT ABDOMEN AND PELVIS WITHOUT CONTRAST TECHNIQUE: Multidetector CT imaging of the abdomen and pelvis was performed following the standard protocol without IV contrast. COMPARISON:  PET-CT 09/23/2016 FINDINGS: Lower chest: Pulmonary masses are seen within the right middle lobe, right lower lobe and left lower lobe, consistent with known metastatic lung cancer. There is a small right pleural effusion. Small pericardial effusion. Hepatobiliary: No focal liver abnormality is seen. No gallstones, gallbladder wall thickening, or biliary dilatation. Pancreas: Unremarkable. No pancreatic ductal dilatation or surrounding inflammatory changes. Spleen: Normal in size  without focal abnormality. Adrenals/Urinary Tract: Known metastatic right adrenal mass measures 18 mm in cross-section. No evidence of nephrolithiasis, hydronephrosis or obstructive uropathy. The urinary bladder is normal, although partially obscured by beam hardening artifact from left hip prosthesis. Stomach/Bowel: Stomach is within normal limits. Appendix appears normal. No evidence of bowel wall thickening, distention, or inflammatory changes. Vascular/Lymphatic: Calcific atherosclerotic disease of the abdominal aorta. Numerous mildly enlarged retroperitoneal lymph nodes centered about the takeoff of the renal arteries. Reproductive: Prostate is unremarkable. Other: No abdominal wall hernia or abnormality. No abdominopelvic ascites. Musculoskeletal: 10 mm sclerotic focus within the superior aspect of L4 vertebral body. The previously demonstrated by PET-CT metastatic deposit within T10 vertebral body corresponds to a lytic lesion within the left pedicle, axial image 6/91 sequence 2 and sagittal image 101/165, sequence 6. There is a small soft tissue component to this lesion. Large permeative lytic lesion within the left ilium, bordering the SI joint measures 5.3 cm. IMPRESSION: No evidence of obstructive uropathy or nephrolithiasis. Pulmonary masses within the right middle lobe, right lower lobe, left lower lobe, consistent with known metastatic lung cancer. Small to moderate right pleural effusion. Small pericardial effusion. Known metastatic right adrenal mass. Numerous mildly enlarged retroperitoneal upper abdominal lymph nodes, likely metastatic. T10 left pedicle metastatic lytic lesion. Given its proximity to the osseous neural foramen, it may have caused patient's symptoms. Large permeative lytic lesion within the left ilium causing cortical destruction, probably also metastatic. Electronically Signed   By: Fidela Salisbury M.D.   On: 10/11/2016 21:11     CBC  Recent Labs Lab 10/20/16 0454  WBC  6.7  HGB 10.7*  HCT 32.3*  PLT 317  MCV 83.8  MCH 27.9  MCHC 33.3  RDW 14.9*    Chemistries   Recent Labs Lab 10/20/16 0454  CREATININE 0.74   ------------------------------------------------------------------------------------------------------------------ estimated creatinine clearance is 115 mL/min (by C-G formula based on SCr of 0.74 mg/dL). ------------------------------------------------------------------------------------------------------------------ No results for input(s): HGBA1C in the last 72 hours. ------------------------------------------------------------------------------------------------------------------ No results for input(s): CHOL, HDL, LDLCALC, TRIG, CHOLHDL, LDLDIRECT in the last 72 hours. ------------------------------------------------------------------------------------------------------------------ No results for input(s): TSH, T4TOTAL, T3FREE, THYROIDAB in the last 72 hours.  Invalid input(s): FREET3 ------------------------------------------------------------------------------------------------------------------ No results for input(s): VITAMINB12, FOLATE, FERRITIN, TIBC, IRON, RETICCTPCT in the last 72 hours.  Coagulation profile No results for input(s): INR, PROTIME in the last 168 hours.  No results for input(s): DDIMER in the last 72 hours.  Cardiac Enzymes No results for input(s): CKMB, TROPONINI, MYOGLOBIN in the last 168 hours.  Invalid input(s): CK ------------------------------------------------------------------------------------------------------------------ Invalid input(s): Geraldine   This is a 51 y.o. 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 MRI of the thoracolumbar spine recommended by neurosurgery. Patient refused an MRI at this time and does not want any surgeries or kyphoplasty. Oxycontin 40mg  BID. Oxycodone 10mg  Q 4 hr PRN.  #Acute on chronic COPD exacerbation with acute on chronic respiratory failure Resolved Finish prednisone Nebs PRN  #. Lung cancer with metastatic disease Prognosis poor. Will need  hospice following after discharge  #. History of hypertension and atrial fibrillation -Continue Cardizem  #. 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 Tobacco Use disorder Patient counseled to quit smoking earlier this admission  #DO NOT RESUSCITATE status       Code Status Orders        Start     Ordered   10/12/16 0134  Full CODE  Continuous     10/12/16 0133    Code Status History    Date Active Date Inactive Code Status Order ID Comments User Context   10/12/2016  1:18 AM 10/12/2016  1:33 AM DNR [id number]  Harvie Bridge, DO ED   01/23/2016 10:03 PM 01/26/2016  2:54 PM DNR [id number]  Fritzi Mandes, MD Inpatient   12/09/2015 11:40 AM 12/12/2015  4:07 PM Full Code [id number]  Epifanio Lesches, MD ED   10/24/2015  1:10 PM 10/27/2015  2:10 PM DNR [id number]  Gladstone Lighter, MD ED   10/20/2015 10:51 AM 10/23/2015  4:37 PM DNR [id number]  Laverle Hobby, MD Inpatient   10/19/2015  8:55 AM 10/20/2015 10:51 AM Full Code [id number]  Dustin Flock, MD Inpatient   10/18/2015 12:29 PM 10/19/2015  8:55 AM DNR [id number]  Laverle Hobby, MD Inpatient   10/18/2015  8:53 AM 10/18/2015 12:29 PM Full Code [id number]  Loletha Grayer, MD Inpatient   10/15/2015  4:43 PM 10/18/2015  8:53 AM DNR [id number]  Lytle Butte, MD ED   10/15/2015  4:43 PM 10/15/2015  4:43 PM Full Code [id number]  Lytle Butte, MD ED   06/26/2015 10:13 AM 06/28/2015  6:05 PM DNR [id number]  Bettey Costa, MD Inpatient   06/26/2015  6:05 AM 06/26/2015 10:13 AM Full Code [id number]  Saundra Shelling, MD Inpatient   06/08/2015 12:15 AM 06/10/2015  4:32 PM Full Code [id number]  Saundra Shelling, MD Inpatient    Advance Directive Documentation     Most Recent Value  Type of Advance Directive  Healthcare Power of [place], Living will  Pre-existing out of facility DNR order (yellow form or pink MOST form)  -  "MOST" Form in Place?  -      Consults  neurosurgery  DVT Prophylaxis  Lovenox    Lab Results  Component Value Date   PLT 317 10/20/2016   Time Spent in minutes   25 min   Hillary Bow R M.D on 10/20/2016 at 12:39 PM  Between 7am to 6pm - Pager - [phone]  After 6pm go to [email or web address] - password [email or web address]   Office  [phone]

## 2016-10-21 LAB — GLUCOSE, CAPILLARY
Glucose-Capillary: 200 mg/dL — ABNORMAL HIGH (ref 65–99)
Glucose-Capillary: 128 mg/dL — ABNORMAL HIGH (ref 65–99)

## 2016-10-21 MED ORDER — POLYETHYLENE GLYCOL 3350 17 G PO PACK: 17.0000 g | PACK | Freq: Every day | ORAL | 0 refills | Status: AC

## 2016-10-21 NOTE — Progress Notes (Signed)
Pt is being discharged today, IV x1 removed. Report called to [employer], EMS notified of the need for transport.

## 2016-10-21 NOTE — Clinical Social Work Note (Addendum)
Patient to be d/c'ed today to [facility].  Patient and family agreeable to plans will transport via ems RN to call report to [phone].  [facility] will be following patient at SNF, contact person is Randall Hiss at [phone].  CSW updated patient's mother that he will be discharging today.  CSW also updated group home that patient will be going to [facility] as a long term care resident.  Evette Cristal, MSW, [place]

## 2016-10-21 NOTE — Discharge Instructions (Signed)
Regular diet. °Activity as tolerated. °

## 2016-10-23 ENCOUNTER — Inpatient Hospital Stay: Payer: Medicaid Other | Admitting: Hematology and Oncology

## 2016-10-23 ENCOUNTER — Telehealth: Payer: Self-pay | Admitting: Urgent Care
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
## 2016-10-23 NOTE — Telephone Encounter (Signed)
I called to follow up with patient about his missed appointment today. I learned that patient had been moved to [facility] under hospice services. Call placed to [facility]; spoke with Wallace Keller who was unaware that patient had an appointment today. Wallace Keller went into patient's room and had a long conversation with the patient regarding his plans to proceed with treatment of his cancer. Per Wallace Keller, patient advised her to inform us that he would not be coming back to the cancer center citing the fact that he was "stage IV and it would do no good". I clarified with the nurse that she was saying that patient was electing not to treat at this time. Per Wallace Keller, the patient confirmed his wishes not to pursue any further treatment. Dr. Mike Gip made aware by this NP.

## 2016-11-02 ENCOUNTER — Encounter: Payer: Self-pay | Admitting: Gastroenterology

## 2016-11-02 ENCOUNTER — Other Ambulatory Visit: Payer: Self-pay

## 2016-11-02 ENCOUNTER — Ambulatory Visit (INDEPENDENT_AMBULATORY_CARE_PROVIDER_SITE_OTHER): Payer: Medicaid Other | Admitting: Gastroenterology

## 2016-11-02 VITALS — BP 122/76 | HR 131 | Temp 98.8°F | Ht 70.0 in | Wt 172.0 lb

## 2016-11-02 DIAGNOSIS — B182 Chronic viral hepatitis C: Secondary | ICD-10-CM

## 2016-11-02 NOTE — Progress Notes (Signed)
Primary Care Physician: Marygrace Drought, MD  Primary Gastroenterologist:  Dr. Lucilla Lame  Chief Complaint  Patient presents with  . Hepatitis C    HPI: Craig Wright is a 51 y.o. male here For his hepatitis C. The patient has had a history of hepatitis C. The patient had a fibrosis scan that showed him to have F2-F3 metavir score without any reported cirrhosis. 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 The patient is not interested in any treatment for his hepatitis C.  Current Outpatient Prescriptions  Medication Sig Dispense Refill  . acetaminophen (TYLENOL) 325 MG tablet Take 650 mg by mouth every 4 (four) hours as needed (for pain/fever).     Marland Kitchen buPROPion (WELLBUTRIN SR) 100 MG 12 hr tablet Take 100 mg by mouth every morning.    . clonazePAM (KLONOPIN) 1 MG tablet Take 1 tablet (1 mg total) by mouth 2 (two) times daily. 10 tablet 0  . diltiazem (CARDIZEM CD) 240 MG 24 hr capsule Take 1 capsule (240 mg total) by mouth daily. 30 capsule 0  . docusate sodium (COLACE) 50 MG capsule Take 2 capsules (100 mg total) by mouth daily as needed for mild constipation. 10 capsule 0  . DULoxetine (CYMBALTA) 60 MG capsule Take 60 mg by mouth daily.     . furosemide (LASIX) 40 MG tablet Take 40 mg by mouth daily.     Marland Kitchen guaifenesin (COUGH SYRUP) 100 MG/5ML syrup Take 200 mg by mouth.    . Ipratropium-Albuterol (COMBIVENT) 20-100 MCG/ACT AERS respimat Inhale 1 puff into the lungs every 4 (four) hours as needed for wheezing.     Marland Kitchen ipratropium-albuterol (DUONEB) 0.5-2.5 (3) MG/3ML SOLN Take 3 mLs by nebulization every 4 (four) hours as needed (for wheezing/shortness of breath).     . magnesium oxide (MAG-OX) 400 MG tablet Take 1 tablet (400 mg total) by mouth daily. 30 tablet 2  . megestrol (MEGACE) 40 MG/ML suspension TAKE 1 TEASPOONFUL (40ml) BY MOUTH ONCE DAILY. 240 mL 0  . metFORMIN (GLUMETZA) 500 MG (MOD) 24 hr tablet Take 2 tablets (1,000 mg total) by  mouth 2 (two) times daily. Reported on 06/26/2015 60 tablet 0  . mirtazapine (REMERON) 45 MG tablet Take 45 mg by mouth at bedtime.    . mometasone-formoterol (DULERA) 100-5 MCG/ACT AERO Inhale 2 puffs into the lungs 2 (two) times daily. 1 Inhaler 0  . nicotine (NICODERM CQ - DOSED IN MG/24 HOURS) 21 mg/24hr patch Place 1 patch (21 mg total) onto the skin daily. 28 patch 0  . ondansetron (ZOFRAN-ODT) 4 MG disintegrating tablet Take 4 mg by mouth every 8 (eight) hours as needed for nausea or vomiting.    Marland Kitchen oxyCODONE (OXYCONTIN) 40 mg 12 hr tablet Take 1 tablet (40 mg total) by mouth every 12 (twelve) hours. 10 tablet 0  . Oxycodone HCl 10 MG TABS Take 1 tablet (10 mg total) by mouth every 4 (four) hours as needed. 8am, 12n, 4pm, 8pm 20 tablet 0  . OXYGEN Inhale 5 L into the lungs.    . polyethylene glycol (MIRALAX / GLYCOLAX) packet Take 17 g by mouth daily. 14 each 0  . potassium chloride SA (K-DUR,KLOR-CON) 20 MEQ tablet Take 20 mEq by mouth 2 (two) times daily.    . roflumilast (DALIRESP) 500 MCG TABS tablet Take 500 mcg by mouth daily.    Marland Kitchen senna (SENNA-LAX) 8.6 MG tablet Take 2 tablets by mouth daily as needed for  constipation.     No current facility-administered medications for this visit.     Allergies as of 11/02/2016 - Review Complete 11/02/2016  Allergen Reaction Noted  . Tape Rash 10/30/2015    ROS:  General: Negative for anorexia, weight loss, fever, chills, fatigue, weakness. ENT: Negative for hoarseness, difficulty swallowing , nasal congestion. CV: Negative for chest pain, angina, palpitations, dyspnea on exertion, peripheral edema.  Respiratory: Negative for dyspnea at rest, dyspnea on exertion, cough, sputum, wheezing.  GI: See history of present illness. GU:  Negative for dysuria, hematuria, urinary incontinence, urinary frequency, nocturnal urination.  Endo: Negative for unusual weight change.    Physical Examination:   BP 122/76   Pulse (!) 131   Temp 98.8 F  (37.1 C) (Oral)   Ht 5\' 10"  (1.778 m)   Wt 172 lb (78 kg)   BMI 24.68 kg/m   General: Well-nourished, well-developed in no acute distress.  Eyes: No icterus. Conjunctivae pink. Extremities: No lower extremity edema. No clubbing or deformities. Neuro: Alert and oriented x 3.  Grossly intact. Skin: Warm and dry, no jaundice.   Psych: Alert and cooperative, normal mood and affect.  Labs:    Imaging Studies: Ct Thoracic Spine Wo Contrast  Result Date: 10/12/2016 CLINICAL DATA:  Acute onset of mid back pain while seated today. No acute injury. Lung cancer metastatic to bone. EXAM: CT THORACIC AND LUMBAR SPINE WITHOUT CONTRAST TECHNIQUE: Multidetector CT imaging of the thoracic and lumbar spine was performed without contrast. Multiplanar CT image reconstructions were also generated. COMPARISON:  Abdominopelvic CT 10/11/2016. PET-CT 09/23/2016. Chest CT 08/06/2016. FINDINGS: CT THORACIC SPINE FINDINGS Alignment: Normal. Vertebrae: Known lytic metastasis involving the left T10 pedicle, transverse process and lamina is grossly stable, measuring up to 2.6 x 1.8 cm on image 120. In correlation with prior PET-CT, there are probable metastases involving the T7 spinous process and left eighth rib near the costovertebral junction. No definite new lesions. No evidence of pathologic fracture or epidural tumor. Paraspinal and other soft tissues: No paraspinal masses are seen. Right infrahilar mass, patchy right basilar airspace disease and a right pleural effusion appears similar to recent abdominal CT. Disc levels: Mild degenerative changes throughout the spine with mild endplate degeneration. No large disc herniation, spinal stenosis or nerve root encroachment seen. CT LUMBAR SPINE FINDINGS Segmentation: Normal. Alignment: Normal. Vertebrae: No lytic or enlarging blastic lesions are seen within the lumbar spine. There is a stable small sclerotic lesion in the right aspect of the L4 vertebral body, likely benign.  There is a large lytic lesion within the left iliac bone abutting the sacroiliac joint, incompletely visualized, although grossly stable from prior studies. No evidence of pathologic fracture. Paraspinal and other soft tissues: No paraspinal masses are seen. Enlarged right adrenal gland again noted, consistent with a metastasis. There is aortic and branch vessel atherosclerosis. Disc levels: No significant disc space findings are seen within the lumbar spine at or above L3-4. L4-5: Loss of disc height with annular disc bulging and a probable left paracentral disc extrusion. There is inferior migration of disc material, mass effect on the thecal sac and probable left L5 nerve root encroachment. There is no foraminal compromise or L4 nerve root encroachment. L5-S1:  Normal interspace. IMPRESSION: 1. Lytic lesions involving the T7 spinous process, left eighth rib, left aspect of the T10 posterior elements and left iliac bone are similar to the recent prior studies and consistent with metastatic lung cancer. No enlarging lesions, pathologic fracture or epidural tumor seen.  2. Disc extrusion on the left at L4-5 may be symptomatic on the basis of left L5 nerve root encroachment in the canal. 3. Grossly stable findings of metastatic lung cancer in the right hemithorax and right adrenal metastasis. Electronically Signed   By: Richardean Sale M.D.   On: 10/12/2016 15:03   Ct Lumbar Spine Wo Contrast  Result Date: 10/12/2016 CLINICAL DATA:  Acute onset of mid back pain while seated today. No acute injury. Lung cancer metastatic to bone. EXAM: CT THORACIC AND LUMBAR SPINE WITHOUT CONTRAST TECHNIQUE: Multidetector CT imaging of the thoracic and lumbar spine was performed without contrast. Multiplanar CT image reconstructions were also generated. COMPARISON:  Abdominopelvic CT 10/11/2016. PET-CT 09/23/2016. Chest CT 08/06/2016. FINDINGS: CT THORACIC SPINE FINDINGS Alignment: Normal. Vertebrae: Known lytic metastasis  involving the left T10 pedicle, transverse process and lamina is grossly stable, measuring up to 2.6 x 1.8 cm on image 120. In correlation with prior PET-CT, there are probable metastases involving the T7 spinous process and left eighth rib near the costovertebral junction. No definite new lesions. No evidence of pathologic fracture or epidural tumor. Paraspinal and other soft tissues: No paraspinal masses are seen. Right infrahilar mass, patchy right basilar airspace disease and a right pleural effusion appears similar to recent abdominal CT. Disc levels: Mild degenerative changes throughout the spine with mild endplate degeneration. No large disc herniation, spinal stenosis or nerve root encroachment seen. CT LUMBAR SPINE FINDINGS Segmentation: Normal. Alignment: Normal. Vertebrae: No lytic or enlarging blastic lesions are seen within the lumbar spine. There is a stable small sclerotic lesion in the right aspect of the L4 vertebral body, likely benign. There is a large lytic lesion within the left iliac bone abutting the sacroiliac joint, incompletely visualized, although grossly stable from prior studies. No evidence of pathologic fracture. Paraspinal and other soft tissues: No paraspinal masses are seen. Enlarged right adrenal gland again noted, consistent with a metastasis. There is aortic and branch vessel atherosclerosis. Disc levels: No significant disc space findings are seen within the lumbar spine at or above L3-4. L4-5: Loss of disc height with annular disc bulging and a probable left paracentral disc extrusion. There is inferior migration of disc material, mass effect on the thecal sac and probable left L5 nerve root encroachment. There is no foraminal compromise or L4 nerve root encroachment. L5-S1:  Normal interspace. IMPRESSION: 1. Lytic lesions involving the T7 spinous process, left eighth rib, left aspect of the T10 posterior elements and left iliac bone are similar to the recent prior studies and  consistent with metastatic lung cancer. No enlarging lesions, pathologic fracture or epidural tumor seen. 2. Disc extrusion on the left at L4-5 may be symptomatic on the basis of left L5 nerve root encroachment in the canal. 3. Grossly stable findings of metastatic lung cancer in the right hemithorax and right adrenal metastasis. Electronically Signed   By: Richardean Sale M.D.   On: 10/12/2016 15:03   Ct Renal Stone Study  Result Date: 10/11/2016 CLINICAL DATA:  Acute left flank pain.  Known lung cancer. EXAM: CT ABDOMEN AND PELVIS WITHOUT CONTRAST TECHNIQUE: Multidetector CT imaging of the abdomen and pelvis was performed following the standard protocol without IV contrast. COMPARISON:  PET-CT 09/23/2016 FINDINGS: Lower chest: Pulmonary masses are seen within the right middle lobe, right lower lobe and left lower lobe, consistent with known metastatic lung cancer. There is a small right pleural effusion. Small pericardial effusion. Hepatobiliary: No focal liver abnormality is seen. No gallstones, gallbladder wall thickening,  or biliary dilatation. Pancreas: Unremarkable. No pancreatic ductal dilatation or surrounding inflammatory changes. Spleen: Normal in size without focal abnormality. Adrenals/Urinary Tract: Known metastatic right adrenal mass measures 18 mm in cross-section. No evidence of nephrolithiasis, hydronephrosis or obstructive uropathy. The urinary bladder is normal, although partially obscured by beam hardening artifact from left hip prosthesis. Stomach/Bowel: Stomach is within normal limits. Appendix appears normal. No evidence of bowel wall thickening, distention, or inflammatory changes. Vascular/Lymphatic: Calcific atherosclerotic disease of the abdominal aorta. Numerous mildly enlarged retroperitoneal lymph nodes centered about the takeoff of the renal arteries. Reproductive: Prostate is unremarkable. Other: No abdominal wall hernia or abnormality. No abdominopelvic ascites. Musculoskeletal:  10 mm sclerotic focus within the superior aspect of L4 vertebral body. The previously demonstrated by PET-CT metastatic deposit within T10 vertebral body corresponds to a lytic lesion within the left pedicle, axial image 6/91 sequence 2 and sagittal image 101/165, sequence 6. There is a small soft tissue component to this lesion. Large permeative lytic lesion within the left ilium, bordering the SI joint measures 5.3 cm. IMPRESSION: No evidence of obstructive uropathy or nephrolithiasis. Pulmonary masses within the right middle lobe, right lower lobe, left lower lobe, consistent with known metastatic lung cancer. Small to moderate right pleural effusion. Small pericardial effusion. Known metastatic right adrenal mass. Numerous mildly enlarged retroperitoneal upper abdominal lymph nodes, likely metastatic. T10 left pedicle metastatic lytic lesion. Given its proximity to the osseous neural foramen, it may have caused patient's symptoms. Large permeative lytic lesion within the left ilium causing cortical destruction, probably also metastatic. Electronically Signed   By: Fidela Salisbury M.D.   On: 10/11/2016 21:11    Assessment and Plan:   DUAYNE BRIDEAU is a 52 y.o. y/o male with a history of hepatitis C who now has metastatic lung cancer. The patient is not interested in treatment for his hepatitis C. I agree with the patient that with his liver disease there is no benefit to treatment at this time. The patient will follow up with oncology.    Lucilla Lame, MD. Marval Regal   Note: This dictation was prepared with Dragon dictation along with smaller phrase technology. Any transcriptional errors that result from this process are unintentional.

## 2016-11-26 ENCOUNTER — Ambulatory Visit: Payer: Medicaid Other | Attending: Radiation Oncology | Admitting: Radiation Oncology

## 2016-11-30 ENCOUNTER — Ambulatory Visit: Payer: Medicaid Other | Admitting: Radiation Oncology

## 2017-01-23 DEATH — deceased

## 2017-03-13 ENCOUNTER — Other Ambulatory Visit: Payer: Self-pay | Admitting: Nurse Practitioner

## 2018-01-20 IMAGING — CT CT RENAL STONE PROTOCOL
2 of 4 series · 16 of 46 positions shown, 18 images · non-contrast
Comparison: None.

CLINICAL DATA: Diffuse abdominal pain and flank pain.

EXAM:
CT ABDOMEN AND PELVIS WITHOUT CONTRAST
TECHNIQUE: Multidetector CT imaging of the abdomen and pelvis was performed
following the standard protocol without IV contrast.

[Series 2: axial st · axial · 0.78mm/px · z∈[-1058,-623]mm · 13 of 95 slices shown, 15 images]
[im 4/95  soft-tissue]
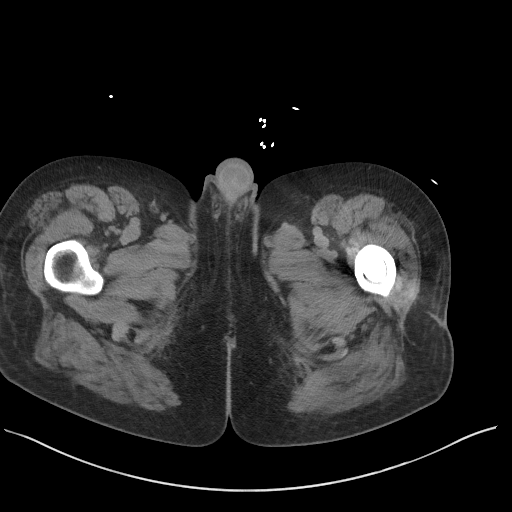
[im 4/95  bone]
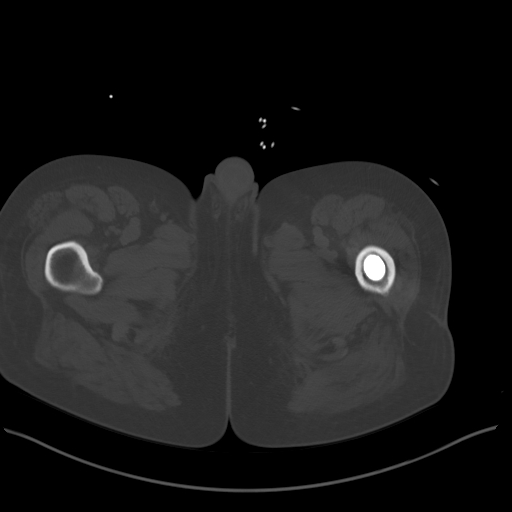
[im 12/95  soft-tissue]
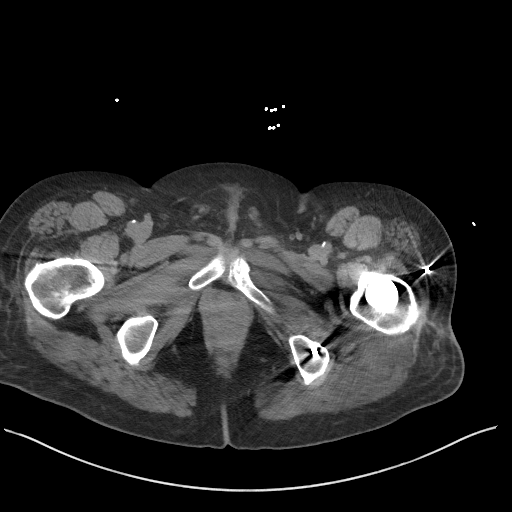
[im 19/95  soft-tissue]
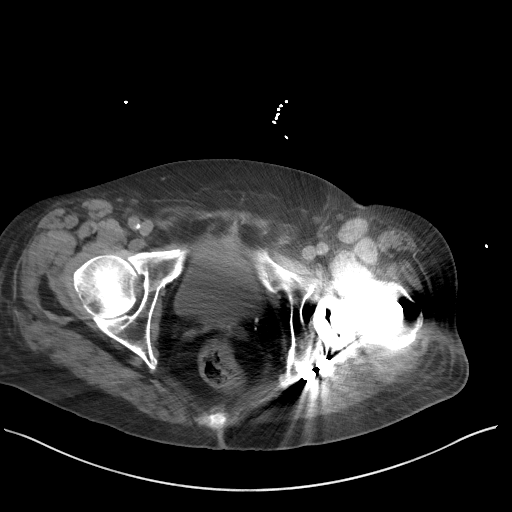
[im 27/95  soft-tissue]
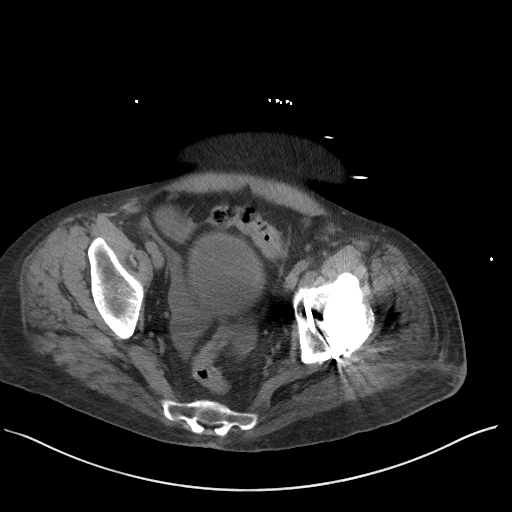
[im 34/95  soft-tissue]
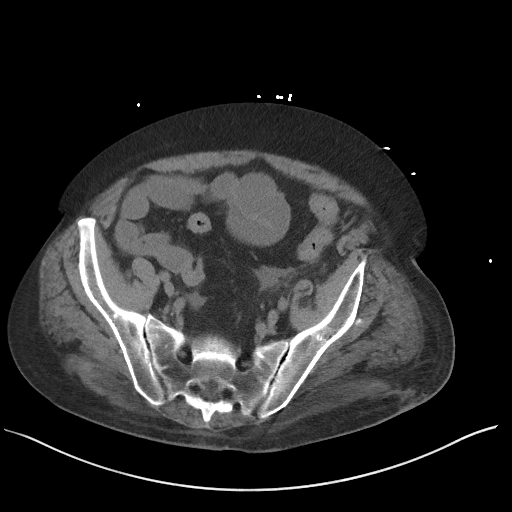
[im 42/95  soft-tissue]
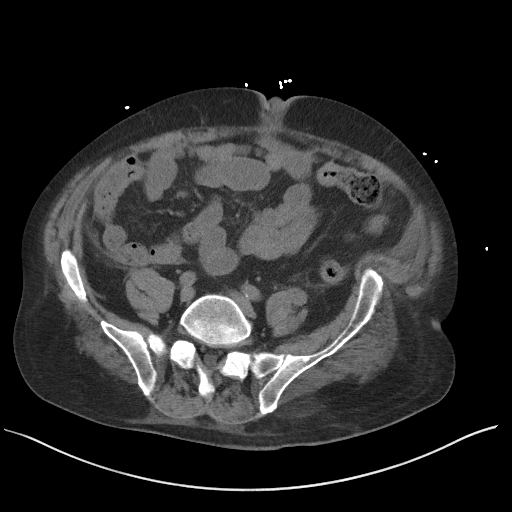
[im 49/95  soft-tissue]
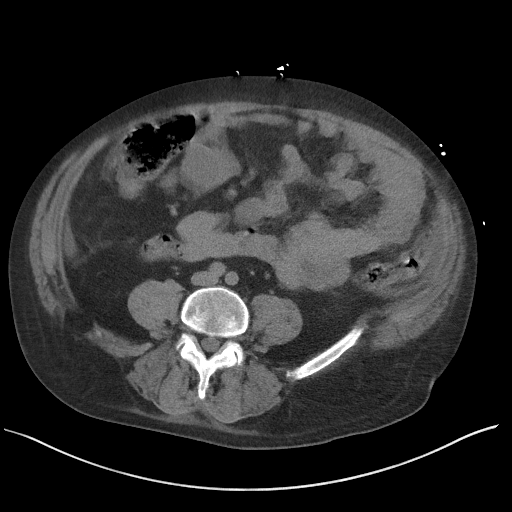
[im 53/95  soft-tissue]
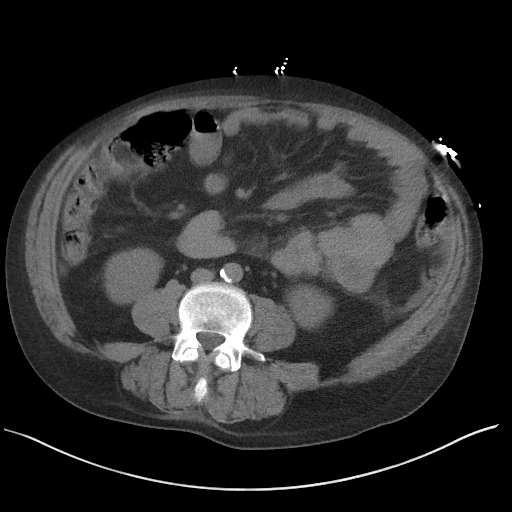
[im 61/95  soft-tissue]
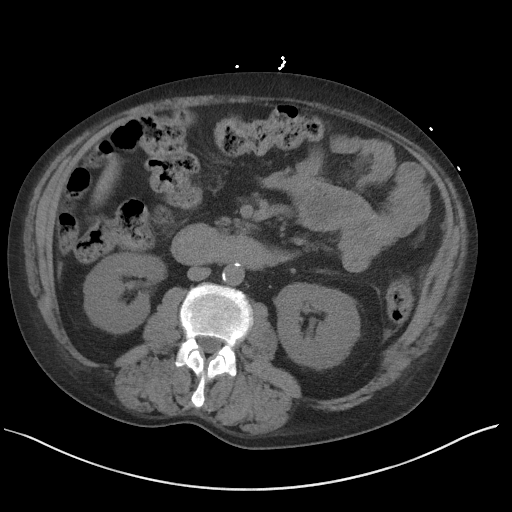
[im 61/95  bone]
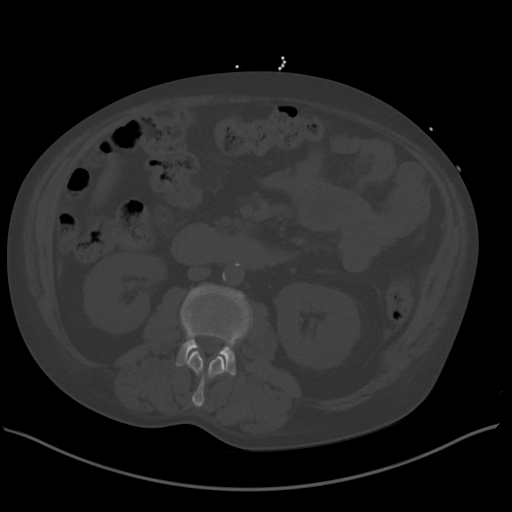
[im 68/95  soft-tissue]
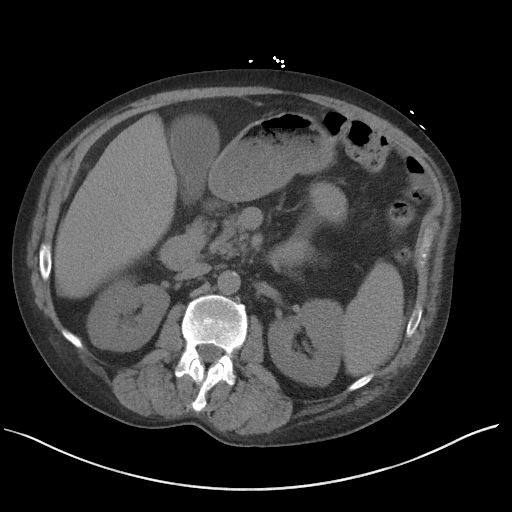
[im 76/95  soft-tissue]
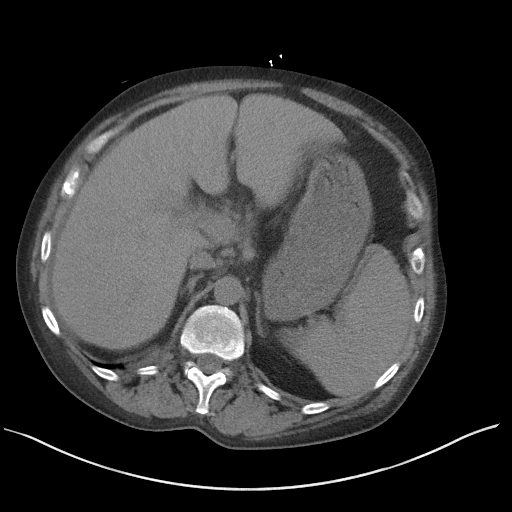
[im 83/95  soft-tissue]
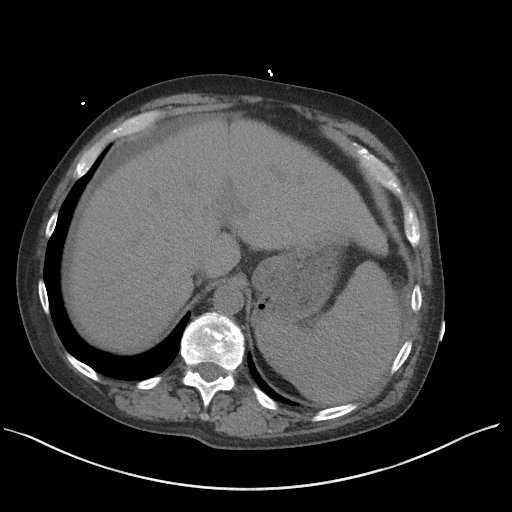
[im 91/95  soft-tissue]
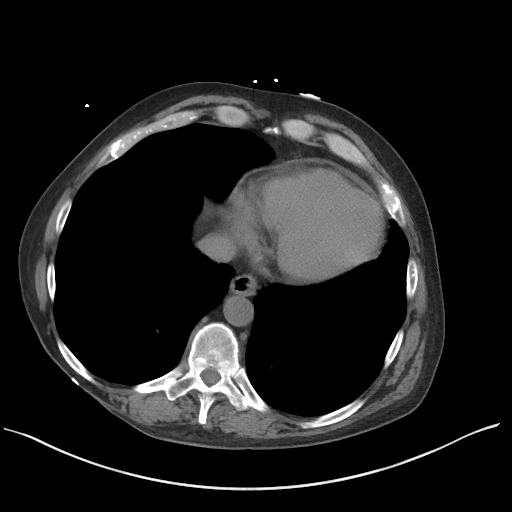

[Series 4: coronal · coronal · 0.77mm/px · 3 of 140 slices shown]
[im 47/140  soft-tissue]
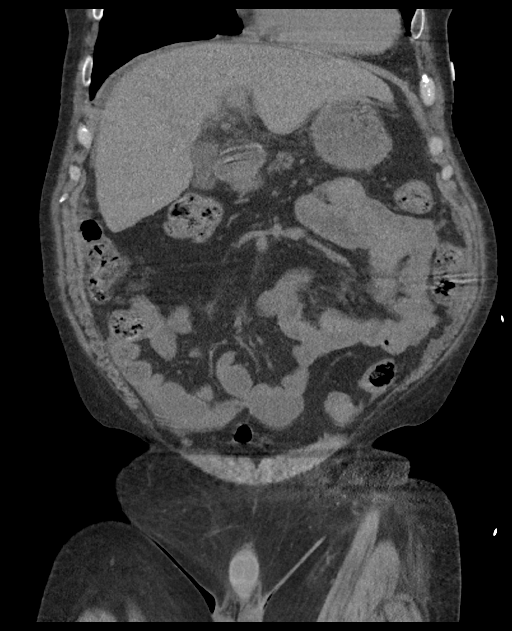
[im 62/140  soft-tissue]
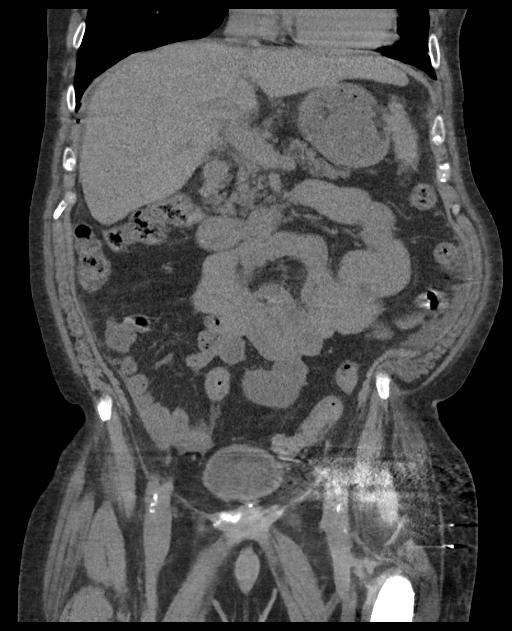
[im 78/140  soft-tissue]
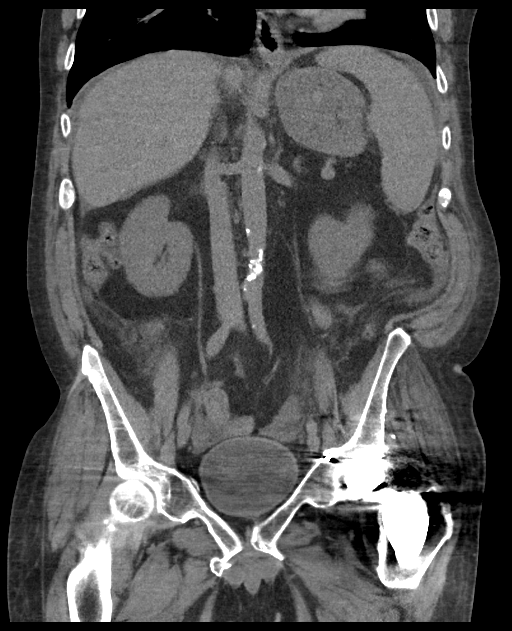

[16 of 46 positions shown; findings below may reference images not displayed]

FINDINGS: Lower chest: No acute findings.

Hepatobiliary: No mass visualized on this unenhanced exam.
Gallbladder is unremarkable.

Pancreas: No mass or inflammatory process visualized on this
unenhanced exam.

Spleen: Upper limits of normal in size measuring approximately 13 cm
in length.

Adrenals/Urinary tract: No evidence of urolithiasis or
hydronephrosis. Unremarkable appearance of bladder.

Stomach/Bowel: No evidence of obstruction, inflammatory process, or
abnormal fluid collections.

Vascular/Lymphatic: No pathologically enlarged lymph nodes
identified. No evidence of abdominal aortic aneurysm. Aortic
atherosclerosis.

Reproductive:  No mass or other significant abnormality.

Other: Mild ascites seen in the right upper quadrant of the abdomen
and pelvis.

Musculoskeletal: No suspicious bone lesions identified. Old
bilateral pubic rami fracture deformities are noted as well as
surgical fixation hardware in the left ilium and a bipolar left hip
prosthesis.
IMPRESSION: Mild ascites within the abdomen and pelvis.  Mild splenomegaly.

No mass, lymphadenopathy, or inflammatory process identified.

Aortic atherosclerosis.
